# Patient Record
Sex: Female | Born: 1960
Health system: Southern US, Community
[De-identification: ages and names within clinical notes are randomized; demographics above are authoritative.]

## PROBLEM LIST (undated history)

## (undated) DIAGNOSIS — D649 Anemia, unspecified: Secondary | ICD-10-CM

## (undated) DIAGNOSIS — K2210 Ulcer of esophagus without bleeding: Secondary | ICD-10-CM

## (undated) DIAGNOSIS — I10 Essential (primary) hypertension: Secondary | ICD-10-CM

## (undated) DIAGNOSIS — I219 Acute myocardial infarction, unspecified: Secondary | ICD-10-CM

## (undated) DIAGNOSIS — M797 Fibromyalgia: Secondary | ICD-10-CM

## (undated) DIAGNOSIS — J310 Chronic rhinitis: Secondary | ICD-10-CM

## (undated) DIAGNOSIS — F32A Depression, unspecified: Secondary | ICD-10-CM

## (undated) DIAGNOSIS — K449 Diaphragmatic hernia without obstruction or gangrene: Secondary | ICD-10-CM

## (undated) DIAGNOSIS — R51 Headache: Secondary | ICD-10-CM

## (undated) DIAGNOSIS — E785 Hyperlipidemia, unspecified: Secondary | ICD-10-CM

## (undated) DIAGNOSIS — M545 Low back pain, unspecified: Secondary | ICD-10-CM

## (undated) DIAGNOSIS — G8929 Other chronic pain: Secondary | ICD-10-CM

## (undated) DIAGNOSIS — J302 Other seasonal allergic rhinitis: Secondary | ICD-10-CM

## (undated) DIAGNOSIS — R519 Headache, unspecified: Secondary | ICD-10-CM

## (undated) DIAGNOSIS — K253 Acute gastric ulcer without hemorrhage or perforation: Secondary | ICD-10-CM

## (undated) DIAGNOSIS — F419 Anxiety disorder, unspecified: Secondary | ICD-10-CM

## (undated) DIAGNOSIS — K219 Gastro-esophageal reflux disease without esophagitis: Secondary | ICD-10-CM

## (undated) DIAGNOSIS — F329 Major depressive disorder, single episode, unspecified: Secondary | ICD-10-CM

## (undated) DIAGNOSIS — I2510 Atherosclerotic heart disease of native coronary artery without angina pectoris: Secondary | ICD-10-CM

## (undated) DIAGNOSIS — T7840XA Allergy, unspecified, initial encounter: Secondary | ICD-10-CM

## (undated) DIAGNOSIS — G894 Chronic pain syndrome: Secondary | ICD-10-CM

## (undated) DIAGNOSIS — R58 Hemorrhage, not elsewhere classified: Secondary | ICD-10-CM

## (undated) HISTORY — DX: Allergy, unspecified, initial encounter: T78.40XA

## (undated) HISTORY — DX: Chronic pain syndrome: G89.4

## (undated) HISTORY — DX: Anxiety disorder, unspecified: F41.9

## (undated) HISTORY — DX: Atherosclerotic heart disease of native coronary artery without angina pectoris: I25.10

## (undated) HISTORY — DX: Hyperlipidemia, unspecified: E78.5

## (undated) HISTORY — DX: Diaphragmatic hernia without obstruction or gangrene: K44.9

## (undated) HISTORY — DX: Anemia, unspecified: D64.9

## (undated) HISTORY — DX: Essential (primary) hypertension: I10

## (undated) HISTORY — DX: Gastro-esophageal reflux disease without esophagitis: K21.9

## (undated) HISTORY — DX: Depression, unspecified: F32.A

## (undated) HISTORY — DX: Hemorrhage, not elsewhere classified: R58

## (undated) HISTORY — DX: Acute gastric ulcer without hemorrhage or perforation: K25.3

## (undated) HISTORY — PX: COLONOSCOPY: SHX174

## (undated) HISTORY — DX: Chronic rhinitis: J31.0

## (undated) HISTORY — DX: Acute myocardial infarction, unspecified: I21.9

## (undated) HISTORY — DX: Fibromyalgia: M79.7

## (undated) HISTORY — DX: Major depressive disorder, single episode, unspecified: F32.9

## (undated) HISTORY — DX: Ulcer of esophagus without bleeding: K22.10

## (undated) HISTORY — PX: UPPER GASTROINTESTINAL ENDOSCOPY: SHX188

---

## 2000-03-08 ENCOUNTER — Encounter: Admission: RE | Admit: 2000-03-08 | Discharge: 2000-03-08 | Payer: Self-pay | Admitting: Internal Medicine

## 2000-03-24 ENCOUNTER — Ambulatory Visit (HOSPITAL_BASED_OUTPATIENT_CLINIC_OR_DEPARTMENT_OTHER): Admission: RE | Admit: 2000-03-24 | Discharge: 2000-03-24 | Payer: Self-pay | Admitting: General Surgery

## 2000-03-24 ENCOUNTER — Encounter (INDEPENDENT_AMBULATORY_CARE_PROVIDER_SITE_OTHER): Payer: Self-pay | Admitting: Specialist

## 2000-03-27 ENCOUNTER — Emergency Department (HOSPITAL_COMMUNITY): Admission: EM | Admit: 2000-03-27 | Discharge: 2000-03-27 | Payer: Self-pay | Admitting: *Deleted

## 2000-03-29 HISTORY — PX: BREAST CYST EXCISION: SHX579

## 2002-02-14 ENCOUNTER — Emergency Department (HOSPITAL_COMMUNITY): Admission: EM | Admit: 2002-02-14 | Discharge: 2002-02-14 | Payer: Self-pay | Admitting: Emergency Medicine

## 2003-05-29 ENCOUNTER — Encounter (INDEPENDENT_AMBULATORY_CARE_PROVIDER_SITE_OTHER): Payer: Self-pay | Admitting: Internal Medicine

## 2003-05-29 ENCOUNTER — Encounter: Admission: RE | Admit: 2003-05-29 | Discharge: 2003-05-29 | Payer: Self-pay | Admitting: Family Medicine

## 2003-06-28 ENCOUNTER — Emergency Department (HOSPITAL_COMMUNITY): Admission: EM | Admit: 2003-06-28 | Discharge: 2003-06-28 | Payer: Self-pay | Admitting: Emergency Medicine

## 2004-03-29 HISTORY — PX: CORONARY ANGIOPLASTY: SHX604

## 2004-12-17 ENCOUNTER — Inpatient Hospital Stay (HOSPITAL_COMMUNITY): Admission: EM | Admit: 2004-12-17 | Discharge: 2004-12-24 | Payer: Self-pay | Admitting: Emergency Medicine

## 2004-12-22 ENCOUNTER — Encounter (INDEPENDENT_AMBULATORY_CARE_PROVIDER_SITE_OTHER): Payer: Self-pay | Admitting: *Deleted

## 2005-01-07 ENCOUNTER — Inpatient Hospital Stay (HOSPITAL_COMMUNITY): Admission: EM | Admit: 2005-01-07 | Discharge: 2005-01-08 | Payer: Self-pay | Admitting: *Deleted

## 2005-01-15 ENCOUNTER — Ambulatory Visit: Payer: Self-pay | Admitting: Internal Medicine

## 2005-02-01 ENCOUNTER — Ambulatory Visit: Payer: Self-pay | Admitting: Internal Medicine

## 2005-03-30 ENCOUNTER — Ambulatory Visit: Payer: Self-pay | Admitting: Internal Medicine

## 2005-04-26 ENCOUNTER — Ambulatory Visit: Payer: Self-pay | Admitting: Internal Medicine

## 2005-06-30 ENCOUNTER — Ambulatory Visit: Payer: Self-pay | Admitting: Internal Medicine

## 2005-08-03 ENCOUNTER — Ambulatory Visit: Payer: Self-pay | Admitting: Internal Medicine

## 2005-08-19 ENCOUNTER — Ambulatory Visit: Payer: Self-pay | Admitting: Internal Medicine

## 2005-09-17 ENCOUNTER — Ambulatory Visit: Payer: Self-pay | Admitting: Internal Medicine

## 2005-11-15 ENCOUNTER — Ambulatory Visit: Payer: Self-pay | Admitting: Internal Medicine

## 2005-12-20 ENCOUNTER — Ambulatory Visit: Payer: Self-pay | Admitting: Internal Medicine

## 2006-02-07 ENCOUNTER — Emergency Department (HOSPITAL_COMMUNITY): Admission: EM | Admit: 2006-02-07 | Discharge: 2006-02-08 | Payer: Self-pay | Admitting: Emergency Medicine

## 2006-02-09 ENCOUNTER — Ambulatory Visit: Payer: Self-pay | Admitting: Internal Medicine

## 2006-05-03 ENCOUNTER — Ambulatory Visit: Payer: Self-pay | Admitting: Internal Medicine

## 2006-06-09 ENCOUNTER — Ambulatory Visit: Payer: Self-pay | Admitting: Internal Medicine

## 2006-09-09 ENCOUNTER — Ambulatory Visit: Payer: Self-pay | Admitting: Internal Medicine

## 2006-09-09 LAB — CONVERTED CEMR LAB
ALT: 16 units/L (ref 0–40)
AST: 25 units/L (ref 0–37)
Bilirubin, Direct: 0.1 mg/dL (ref 0.0–0.3)
CO2: 26 meq/L (ref 19–32)
Calcium: 10 mg/dL (ref 8.4–10.5)
Chloride: 108 meq/L (ref 96–112)
Cholesterol: 181 mg/dL (ref 0–200)
Eosinophils Absolute: 0.1 10*3/uL (ref 0.0–0.6)
Eosinophils Relative: 1 % (ref 0.0–5.0)
GFR calc Af Amer: 87 mL/min
Glucose, Bld: 100 mg/dL — ABNORMAL HIGH (ref 70–99)
HCT: 40 % (ref 36.0–46.0)
Hemoglobin: 13.6 g/dL (ref 12.0–15.0)
Ketones, ur: NEGATIVE mg/dL
Leukocytes, UA: NEGATIVE
Lymphocytes Relative: 18.8 % (ref 12.0–46.0)
MCV: 94.8 fL (ref 78.0–100.0)
Monocytes Absolute: 0.5 10*3/uL (ref 0.2–0.7)
Neutro Abs: 7.5 10*3/uL (ref 1.4–7.7)
Neutrophils Relative %: 74.4 % (ref 43.0–77.0)
Nitrite: NEGATIVE
RBC: 4.22 M/uL (ref 3.87–5.11)
Total Protein: 7.7 g/dL (ref 6.0–8.3)
WBC: 10 10*3/uL (ref 4.5–10.5)

## 2007-06-27 ENCOUNTER — Ambulatory Visit: Payer: Self-pay | Admitting: Internal Medicine

## 2007-06-27 DIAGNOSIS — I2510 Atherosclerotic heart disease of native coronary artery without angina pectoris: Secondary | ICD-10-CM

## 2007-06-27 DIAGNOSIS — K219 Gastro-esophageal reflux disease without esophagitis: Secondary | ICD-10-CM | POA: Insufficient documentation

## 2007-06-27 DIAGNOSIS — E785 Hyperlipidemia, unspecified: Secondary | ICD-10-CM

## 2007-06-27 DIAGNOSIS — F411 Generalized anxiety disorder: Secondary | ICD-10-CM | POA: Insufficient documentation

## 2007-06-27 DIAGNOSIS — I10 Essential (primary) hypertension: Secondary | ICD-10-CM | POA: Insufficient documentation

## 2007-07-27 ENCOUNTER — Ambulatory Visit: Payer: Self-pay | Admitting: Internal Medicine

## 2007-07-27 DIAGNOSIS — F329 Major depressive disorder, single episode, unspecified: Secondary | ICD-10-CM

## 2007-07-27 DIAGNOSIS — F32A Depression, unspecified: Secondary | ICD-10-CM | POA: Insufficient documentation

## 2007-08-24 ENCOUNTER — Encounter: Payer: Self-pay | Admitting: Internal Medicine

## 2007-09-25 ENCOUNTER — Inpatient Hospital Stay (HOSPITAL_COMMUNITY): Admission: EM | Admit: 2007-09-25 | Discharge: 2007-09-28 | Payer: Self-pay | Admitting: Emergency Medicine

## 2007-09-26 ENCOUNTER — Ambulatory Visit: Payer: Self-pay | Admitting: Internal Medicine

## 2007-09-29 ENCOUNTER — Ambulatory Visit: Payer: Self-pay | Admitting: Family Medicine

## 2007-10-04 ENCOUNTER — Telehealth: Payer: Self-pay | Admitting: Internal Medicine

## 2007-10-17 ENCOUNTER — Ambulatory Visit: Payer: Self-pay | Admitting: Internal Medicine

## 2007-12-20 ENCOUNTER — Ambulatory Visit: Payer: Self-pay | Admitting: Internal Medicine

## 2007-12-22 LAB — CONVERTED CEMR LAB
BUN: 13 mg/dL (ref 6–23)
Bilirubin Urine: NEGATIVE
Calcium: 9 mg/dL (ref 8.4–10.5)
Crystals: NEGATIVE
GFR calc Af Amer: 77 mL/min
Glucose, Bld: 92 mg/dL (ref 70–99)
Hemoglobin, Urine: NEGATIVE
Ketones, ur: NEGATIVE mg/dL
Potassium: 4 meq/L (ref 3.5–5.1)
Urine Glucose: NEGATIVE mg/dL
Urobilinogen, UA: 0.2 (ref 0.0–1.0)

## 2008-01-03 ENCOUNTER — Inpatient Hospital Stay (HOSPITAL_COMMUNITY): Admission: EM | Admit: 2008-01-03 | Discharge: 2008-01-07 | Payer: Self-pay | Admitting: Emergency Medicine

## 2008-01-03 ENCOUNTER — Ambulatory Visit: Payer: Self-pay | Admitting: Internal Medicine

## 2008-01-04 ENCOUNTER — Ambulatory Visit: Payer: Self-pay | Admitting: Gastroenterology

## 2008-01-12 ENCOUNTER — Ambulatory Visit: Payer: Self-pay | Admitting: Internal Medicine

## 2008-01-12 DIAGNOSIS — K922 Gastrointestinal hemorrhage, unspecified: Secondary | ICD-10-CM | POA: Insufficient documentation

## 2008-01-12 DIAGNOSIS — M545 Low back pain: Secondary | ICD-10-CM

## 2008-01-13 ENCOUNTER — Telehealth: Payer: Self-pay | Admitting: Internal Medicine

## 2008-02-13 ENCOUNTER — Ambulatory Visit: Payer: Self-pay | Admitting: Internal Medicine

## 2008-02-13 LAB — CONVERTED CEMR LAB
Basophils Absolute: 0.1 10*3/uL (ref 0.0–0.1)
CO2: 29 meq/L (ref 19–32)
Calcium: 9.5 mg/dL (ref 8.4–10.5)
Eosinophils Absolute: 0.2 10*3/uL (ref 0.0–0.7)
GFR calc Af Amer: 69 mL/min
GFR calc non Af Amer: 57 mL/min
Hemoglobin: 11.3 g/dL — ABNORMAL LOW (ref 12.0–15.0)
Lymphocytes Relative: 18.2 % (ref 12.0–46.0)
MCHC: 34.4 g/dL (ref 30.0–36.0)
MCV: 94.4 fL (ref 78.0–100.0)
Neutro Abs: 6.5 10*3/uL (ref 1.4–7.7)
RDW: 13.3 % (ref 11.5–14.6)
Sodium: 138 meq/L (ref 135–145)

## 2008-03-19 ENCOUNTER — Ambulatory Visit: Payer: Self-pay | Admitting: Internal Medicine

## 2008-04-30 ENCOUNTER — Ambulatory Visit: Payer: Self-pay | Admitting: Internal Medicine

## 2008-05-31 ENCOUNTER — Ambulatory Visit: Payer: Self-pay | Admitting: Internal Medicine

## 2008-06-21 ENCOUNTER — Ambulatory Visit: Payer: Self-pay | Admitting: Internal Medicine

## 2008-06-21 LAB — CONVERTED CEMR LAB
AST: 21 units/L (ref 0–37)
Alkaline Phosphatase: 38 units/L — ABNORMAL LOW (ref 39–117)
Bilirubin, Direct: 0 mg/dL (ref 0.0–0.3)
Direct LDL: 125 mg/dL
GFR calc non Af Amer: 86.62 mL/min (ref >60)
Glucose, Bld: 95 mg/dL (ref 70–99)
HDL: 56.7 mg/dL (ref >39.00)
Potassium: 4.1 meq/L (ref 3.5–5.1)
Sodium: 137 meq/L (ref 135–145)
Total Bilirubin: 0.8 mg/dL (ref 0.3–1.2)
Total CHOL/HDL Ratio: 4
VLDL: 19.2 mg/dL (ref 0.0–40.0)
Vit D, 25-Hydroxy: 30 ng/mL (ref 30–89)

## 2008-07-03 ENCOUNTER — Ambulatory Visit: Payer: Self-pay | Admitting: Internal Medicine

## 2008-07-04 LAB — CONVERTED CEMR LAB
Bilirubin Urine: NEGATIVE
Ketones, ur: NEGATIVE mg/dL
Leukocytes, UA: NEGATIVE
Nitrite: NEGATIVE

## 2008-09-03 ENCOUNTER — Emergency Department (HOSPITAL_COMMUNITY): Admission: EM | Admit: 2008-09-03 | Discharge: 2008-09-04 | Payer: Self-pay | Admitting: Emergency Medicine

## 2008-09-06 ENCOUNTER — Encounter (INDEPENDENT_AMBULATORY_CARE_PROVIDER_SITE_OTHER): Payer: Self-pay | Admitting: Internal Medicine

## 2008-09-06 ENCOUNTER — Ambulatory Visit: Payer: Self-pay | Admitting: Internal Medicine

## 2008-09-13 ENCOUNTER — Ambulatory Visit: Payer: Self-pay | Admitting: Internal Medicine

## 2008-10-07 ENCOUNTER — Ambulatory Visit: Payer: Self-pay | Admitting: Infectious Diseases

## 2008-10-07 ENCOUNTER — Encounter (INDEPENDENT_AMBULATORY_CARE_PROVIDER_SITE_OTHER): Payer: Self-pay | Admitting: Internal Medicine

## 2008-10-07 DIAGNOSIS — R51 Headache: Secondary | ICD-10-CM

## 2008-10-07 DIAGNOSIS — R519 Headache, unspecified: Secondary | ICD-10-CM | POA: Insufficient documentation

## 2008-10-07 LAB — CONVERTED CEMR LAB
ALT: 11 units/L (ref 0–35)
Anti Nuclear Antibody(ANA): NEGATIVE
CO2: 28 meq/L (ref 19–32)
Calcium: 9.8 mg/dL (ref 8.4–10.5)
Chloride: 103 meq/L (ref 96–112)
Creatinine, Ser: 0.95 mg/dL (ref 0.40–1.20)
Glucose, Bld: 96 mg/dL (ref 70–99)
MCV: 94.8 fL (ref 78.0–100.0)
Platelets: 435 10*3/uL — ABNORMAL HIGH (ref 150–400)
RBC: 3.88 M/uL (ref 3.87–5.11)
Sed Rate: 8 mm/hr (ref 0–22)
Total Bilirubin: 0.3 mg/dL (ref 0.3–1.2)
Total Protein: 7.2 g/dL (ref 6.0–8.3)
WBC: 5.8 10*3/uL (ref 4.0–10.5)

## 2008-10-16 ENCOUNTER — Ambulatory Visit: Payer: Self-pay | Admitting: Internal Medicine

## 2008-10-16 ENCOUNTER — Ambulatory Visit (HOSPITAL_COMMUNITY): Admission: RE | Admit: 2008-10-16 | Discharge: 2008-10-16 | Payer: Self-pay | Admitting: Infectious Diseases

## 2009-02-26 ENCOUNTER — Telehealth: Payer: Self-pay | Admitting: Internal Medicine

## 2009-03-06 ENCOUNTER — Ambulatory Visit (HOSPITAL_COMMUNITY): Admission: RE | Admit: 2009-03-06 | Discharge: 2009-03-06 | Payer: Self-pay | Admitting: Internal Medicine

## 2009-03-06 ENCOUNTER — Ambulatory Visit: Payer: Self-pay | Admitting: Infectious Diseases

## 2009-03-06 ENCOUNTER — Encounter (INDEPENDENT_AMBULATORY_CARE_PROVIDER_SITE_OTHER): Payer: Self-pay | Admitting: Internal Medicine

## 2009-03-06 LAB — CONVERTED CEMR LAB
ALT: 12 units/L (ref 0–35)
AST: 15 units/L (ref 0–37)
Albumin: 4.4 g/dL (ref 3.5–5.2)
Alkaline Phosphatase: 38 units/L — ABNORMAL LOW (ref 39–117)
BUN: 20 mg/dL (ref 6–23)
Calcium: 9.9 mg/dL (ref 8.4–10.5)
Chloride: 103 meq/L (ref 96–112)
HDL: 61 mg/dL (ref >39)
LDL Cholesterol: 118 mg/dL — ABNORMAL HIGH (ref 0–99)
Lymphocytes Relative: 31 % (ref 12–46)
Lymphs Abs: 2.4 10*3/uL (ref 0.7–4.0)
MCV: 94.8 fL (ref >=78.0)
Monocytes Relative: 9 % (ref 3–12)
Neutro Abs: 4.4 10*3/uL (ref 1.7–7.7)
Neutrophils Relative %: 58 % (ref 43–77)
Platelets: 448 10*3/uL — ABNORMAL HIGH (ref 150–400)
Potassium: 4.4 meq/L (ref 3.5–5.3)
RBC: 4 M/uL (ref 3.87–5.11)
Sed Rate: 10 mm/hr (ref 0–22)
Sodium: 139 meq/L (ref 135–145)
TSH: 2.974 microintl units/mL (ref 0.350–4.5)
Total CK: 64 units/L (ref 7–177)
Total Protein: 7.6 g/dL (ref 6.0–8.3)
WBC: 7.5 10*3/uL (ref 4.0–10.5)

## 2009-03-31 ENCOUNTER — Telehealth: Payer: Self-pay | Admitting: Licensed Clinical Social Worker

## 2009-03-31 ENCOUNTER — Telehealth: Payer: Self-pay | Admitting: *Deleted

## 2009-04-03 ENCOUNTER — Ambulatory Visit: Payer: Self-pay | Admitting: Internal Medicine

## 2009-04-03 DIAGNOSIS — G894 Chronic pain syndrome: Secondary | ICD-10-CM | POA: Insufficient documentation

## 2009-04-15 DIAGNOSIS — I214 Non-ST elevation (NSTEMI) myocardial infarction: Secondary | ICD-10-CM | POA: Insufficient documentation

## 2009-04-18 ENCOUNTER — Ambulatory Visit: Payer: Self-pay | Admitting: Cardiology

## 2009-04-18 DIAGNOSIS — M797 Fibromyalgia: Secondary | ICD-10-CM

## 2009-04-22 ENCOUNTER — Telehealth (INDEPENDENT_AMBULATORY_CARE_PROVIDER_SITE_OTHER): Payer: Self-pay | Admitting: *Deleted

## 2009-04-23 ENCOUNTER — Ambulatory Visit: Payer: Self-pay | Admitting: Cardiology

## 2009-04-23 ENCOUNTER — Ambulatory Visit: Payer: Self-pay

## 2009-04-23 ENCOUNTER — Encounter (HOSPITAL_COMMUNITY): Admission: RE | Admit: 2009-04-23 | Discharge: 2009-07-02 | Payer: Self-pay | Admitting: Cardiology

## 2009-04-29 ENCOUNTER — Telehealth (INDEPENDENT_AMBULATORY_CARE_PROVIDER_SITE_OTHER): Payer: Self-pay | Admitting: *Deleted

## 2009-05-16 ENCOUNTER — Ambulatory Visit: Payer: Self-pay | Admitting: Internal Medicine

## 2009-05-16 LAB — CONVERTED CEMR LAB
ALT: <8 units/L (ref 0–35)
AST: 13 units/L (ref 0–37)
Albumin: 4.2 g/dL (ref 3.5–5.2)
Alkaline Phosphatase: 45 units/L (ref 39–117)
BUN: 9 mg/dL (ref 6–23)
HDL: 75 mg/dL (ref >39)
LDL Cholesterol: 65 mg/dL (ref 0–99)
Potassium: 4.3 meq/L (ref 3.5–5.3)
Total CHOL/HDL Ratio: 2.1

## 2009-05-27 ENCOUNTER — Telehealth: Payer: Self-pay | Admitting: Licensed Clinical Social Worker

## 2009-06-18 ENCOUNTER — Telehealth (INDEPENDENT_AMBULATORY_CARE_PROVIDER_SITE_OTHER): Payer: Self-pay | Admitting: Internal Medicine

## 2009-06-24 ENCOUNTER — Ambulatory Visit: Payer: Self-pay | Admitting: Cardiology

## 2009-07-21 ENCOUNTER — Encounter (INDEPENDENT_AMBULATORY_CARE_PROVIDER_SITE_OTHER): Payer: Self-pay | Admitting: Internal Medicine

## 2009-07-21 ENCOUNTER — Telehealth (INDEPENDENT_AMBULATORY_CARE_PROVIDER_SITE_OTHER): Payer: Self-pay | Admitting: Internal Medicine

## 2009-07-30 ENCOUNTER — Telehealth (INDEPENDENT_AMBULATORY_CARE_PROVIDER_SITE_OTHER): Payer: Self-pay | Admitting: Internal Medicine

## 2009-08-20 ENCOUNTER — Encounter (INDEPENDENT_AMBULATORY_CARE_PROVIDER_SITE_OTHER): Payer: Self-pay | Admitting: Internal Medicine

## 2009-08-28 ENCOUNTER — Encounter: Admission: RE | Admit: 2009-08-28 | Discharge: 2009-08-28 | Payer: Self-pay | Admitting: Internal Medicine

## 2009-08-28 LAB — HM MAMMOGRAPHY

## 2009-10-02 ENCOUNTER — Encounter: Payer: Self-pay | Admitting: Licensed Clinical Social Worker

## 2009-10-02 ENCOUNTER — Ambulatory Visit: Payer: Self-pay | Admitting: Internal Medicine

## 2009-10-09 ENCOUNTER — Telehealth: Payer: Self-pay | Admitting: Licensed Clinical Social Worker

## 2009-11-13 ENCOUNTER — Ambulatory Visit: Payer: Self-pay | Admitting: Cardiology

## 2009-11-13 ENCOUNTER — Ambulatory Visit: Payer: Self-pay | Admitting: Internal Medicine

## 2009-11-13 ENCOUNTER — Encounter: Payer: Self-pay | Admitting: Internal Medicine

## 2009-11-13 ENCOUNTER — Inpatient Hospital Stay (HOSPITAL_COMMUNITY): Admission: AD | Admit: 2009-11-13 | Discharge: 2009-11-15 | Payer: Self-pay | Admitting: Internal Medicine

## 2009-11-13 DIAGNOSIS — R635 Abnormal weight gain: Secondary | ICD-10-CM

## 2009-11-14 ENCOUNTER — Encounter (INDEPENDENT_AMBULATORY_CARE_PROVIDER_SITE_OTHER): Payer: Self-pay | Admitting: Internal Medicine

## 2009-11-21 ENCOUNTER — Ambulatory Visit: Payer: Self-pay | Admitting: Internal Medicine

## 2009-11-21 ENCOUNTER — Encounter: Payer: Self-pay | Admitting: Internal Medicine

## 2009-11-21 DIAGNOSIS — J90 Pleural effusion, not elsewhere classified: Secondary | ICD-10-CM | POA: Insufficient documentation

## 2009-11-21 LAB — CONVERTED CEMR LAB
Hgb A1c MFr Bld: 4.8 %
Sed Rate: 25 mm/hr — ABNORMAL HIGH (ref 0–22)

## 2009-12-05 ENCOUNTER — Ambulatory Visit: Payer: Self-pay | Admitting: Licensed Clinical Social Worker

## 2009-12-21 ENCOUNTER — Emergency Department (HOSPITAL_COMMUNITY)
Admission: EM | Admit: 2009-12-21 | Discharge: 2009-12-21 | Payer: Self-pay | Source: Home / Self Care | Admitting: Emergency Medicine

## 2009-12-21 LAB — CONVERTED CEMR LAB
ALT: 38 units/L
Alkaline Phosphatase: 55 units/L
BUN: 11 mg/dL
Calcium: 9.6 mg/dL
Glucose, Bld: 119 mg/dL
MCV: 80.3 fL
Platelets: 455 10*3/uL
Total Bilirubin: 0.5 mg/dL
Total Protein: 7.3 g/dL

## 2010-01-20 ENCOUNTER — Ambulatory Visit: Payer: Self-pay | Admitting: Internal Medicine

## 2010-01-23 ENCOUNTER — Ambulatory Visit: Payer: Self-pay | Admitting: Licensed Clinical Social Worker

## 2010-01-30 ENCOUNTER — Ambulatory Visit: Payer: Self-pay | Admitting: Licensed Clinical Social Worker

## 2010-02-20 ENCOUNTER — Encounter (INDEPENDENT_AMBULATORY_CARE_PROVIDER_SITE_OTHER): Payer: Self-pay | Admitting: *Deleted

## 2010-03-10 ENCOUNTER — Ambulatory Visit: Payer: Self-pay | Admitting: Internal Medicine

## 2010-03-10 DIAGNOSIS — J310 Chronic rhinitis: Secondary | ICD-10-CM

## 2010-03-12 ENCOUNTER — Ambulatory Visit: Payer: Self-pay | Admitting: Internal Medicine

## 2010-03-25 ENCOUNTER — Telehealth (INDEPENDENT_AMBULATORY_CARE_PROVIDER_SITE_OTHER): Payer: Self-pay | Admitting: *Deleted

## 2010-04-02 ENCOUNTER — Telehealth: Payer: Self-pay | Admitting: Internal Medicine

## 2010-04-13 ENCOUNTER — Other Ambulatory Visit: Payer: Self-pay | Admitting: Internal Medicine

## 2010-04-13 ENCOUNTER — Ambulatory Visit
Admission: RE | Admit: 2010-04-13 | Discharge: 2010-04-13 | Payer: Self-pay | Source: Home / Self Care | Attending: Internal Medicine | Admitting: Internal Medicine

## 2010-04-13 ENCOUNTER — Encounter: Payer: Self-pay | Admitting: Internal Medicine

## 2010-04-14 LAB — SEDIMENTATION RATE: Sed Rate: 20 mm/hr (ref 0–22)

## 2010-04-21 LAB — CONVERTED CEMR LAB: IgE (Immunoglobulin E), Serum: 3.7 intl units/mL (ref 0.0–180.0)

## 2010-04-23 ENCOUNTER — Ambulatory Visit: Admit: 2010-04-23 | Payer: Self-pay | Admitting: Internal Medicine
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
## 2010-04-28 NOTE — Assessment & Plan Note (Signed)
Summary: new / medicare / # / cd   Vital Signs:  Patient profile:   50 year old female Height:      63 inches (160.02 cm) Weight:      178.4 pounds (81.09 kg) O2 Sat:      99 % on Room air Temp:     98.1 degrees F (36.72 degrees C) oral Pulse rate:   93 / minute BP sitting:   124 / 92  (left arm) Cuff size:   large  Vitals Entered By: Tomma Lightning RMA (January 20, 2010 9:56 AM)  O2 Flow:  Room air CC: New patient Is Patient Diabetic? No Pain Assessment Patient in pain? no      Comments Need to clarify if she need to be taking gemfibrozil. Pt was d/c from hosp on 11/15/09   Primary Care Provider:  Rowe Clack MD  CC:  New patient.  History of Present Illness: new pt to me but known to our practice, here to est with new PCP prev seen by AVP in my office (last 06/2008) then [facility]  1) CAD - hx MI with angio 2006 - recent hosp 10/2009 for CP symptoms - noncardiac etiology - reports compliance with ongoing medical treatment and no changes in medication dose or frequency. denies adverse side effects related to current therapy.   2) HTN - reports compliance with ongoing medical treatment and no changes in medication dose or frequency. denies adverse side effects related to current therapy. no HA or edema  3) dylipidemia - reports compliance with ongoing medical treatment and no changes in medication dose or frequency. denies adverse side effects related to current therapy.  no myalgias or GI issues - ?not taking gemfib b/c no refills  4) depression - complicated by insomnia - follows with psyc for same -   5) GERD - reports compliance with ongoing medical treatment and no changes in medication dose or frequency. denies adverse side effects related to current therapy.   Preventive Screening-Counseling & Management  Alcohol-Tobacco     Alcohol drinks/Camp: 0     Alcohol Counseling: not indicated; patient does not drink     Smoking Status: quit     Year Quit: 2004     Tobacco  Counseling: to remain off tobacco products  Caffeine-Diet-Exercise     Does Patient Exercise: no     Type of exercise: walking     Exercise Counseling: to improve exercise regimen  Safety-Violence-Falls     Seat Belt Counseling: not indicated; patient wears seat belts     Helmet Counseling: not applicable     Smoke Detector Counseling: n/a     Violence Counseling: not indicated; no violence risk noted  Clinical Review Panels:  Prevention   Last Mammogram:  BI-RADS CATEGORY 3:  Probably benign finding(s) - short interval^MM DIGITAL DIAGNOSTIC BILAT (08/28/2009)  Immunizations   Last Flu Vaccine:  Fluvax 3+ (01/12/2008)  Lipid Management   Cholesterol:  203 (11/14/2009)   LDL (bad choesterol):  65 (05/16/2009)   HDL (good cholesterol):  47 (11/14/2009)   Triglycerides:  444 (11/14/2009)  Diabetes Management   HgBA1C:  4.8 (11/21/2009)   Creatinine:  1.26 (12/21/2009)   Last Flu Vaccine:  Fluvax 3+ (01/12/2008)  CBC   WBC:  10.2 (12/21/2009)   RBC:  3.56 (12/21/2009)   Hgb:  9.1 (12/21/2009)   Hct:  28.6 (12/21/2009)   Platelets:  455 (12/21/2009)   MCV  80.3 (12/21/2009)   MCHC  33.0 (03/06/2009)  RDW  15.8 (12/21/2009)   PMN:  58 (03/06/2009)   Lymphs:  31 (03/06/2009)   Monos:  9 (03/06/2009)   Eosinophils:  1 (03/06/2009)   Basophil:  0 (03/06/2009)  Complete Metabolic Panel   Glucose:  119 (12/21/2009)   Sodium:  136 (12/21/2009)   Potassium:  3.5 (12/21/2009)   Chloride:  102 (12/21/2009)   CO2:  27 (12/21/2009)   BUN:  11 (12/21/2009)   Creatinine:  1.26 (12/21/2009)   Albumin:  3.6 (12/21/2009)   Total Protein:  7.3 (12/21/2009)   Calcium:  9.6 (12/21/2009)   Total Bili:  0.5 (12/21/2009)   Alk Phos:  55 (12/21/2009)   SGPT (ALT):  38 (12/21/2009)   SGOT (AST):  37 (12/21/2009)   -  Date:  12/21/2009    WBC: 10.2    HGB: 9.1    HCT: 28.6    RBC: 3.56    PLT: 455    MCV: 80.3    RDW: 15.8    BG Random: 119    BUN: 11    Creatinine:  1.26    Sodium: 136    Potassium: 3.5    Chloride: 102    CO2 Total: 27    SGOT (AST): 37    SGPT (ALT): 38    T. Bilirubin: 0.5    Alk Phos: 55    Calcium: 9.6    Total Protein: 7.3    Albumin: 3.6    TSH: 2.475  Date:  11/14/2009    Cholesterol: 203    HDL: 47    Triglycerides: 444  Date:  11/13/2009    TSH: 2.560  Current Medications (verified): 1)  Hydrochlorothiazide 25 Mg Tabs (Hydrochlorothiazide) .... Take One Tab Daily 2)  Nitrostat 0.4 Mg Subl (Nitroglycerin) .... Prn 3)  Claritin-D 12 Hour 5-120 Mg Xr12h-Tab (Loratadine-Pseudoephedrine) .... Take One Tablet Daily For Sinuses. 4)  Pravastatin Sodium 40 Mg Tabs (Pravastatin Sodium) .... Take 1 Tab By Mouth At Bedtime 5)  Amitriptyline Hcl 100 Mg Tabs (Amitriptyline Hcl) .... Take 1 Tablet By Mouth Once A Haisley At Bedtime 6)  Clonazepam 1 Mg Tabs (Clonazepam) .... Take One Tablet Two Times A Fitzwater As Needed For Anxiety. 7)  Labetalol Hcl 200 Mg Tabs (Labetalol Hcl) .... Take 2 Tablets By Mouth Twice Daily 8)  Aspir-Low 81 Mg Tbec (Aspirin) .... Take One Tablet By Mouth Daily 9)  Omeprazole 20 Mg Cpdr (Omeprazole) .... Take 1 Tab Two Times A Ogando 15 Mins Before Meals. 10)  Metoclopramide Hcl 5 Mg Tabs (Metoclopramide Hcl) .... Before Meals  and At Bed Time As Needed 11)  Gemfibrozil 600 Mg Tabs (Gemfibrozil) .... Take One Tablet Two Times A Eley For Cholesterol 12)  Vitamin D3 1000 Unit Tabs (Cholecalciferol) .... Take 1 By Mouth Once Daily 13)  Ranitidine Hcl 150 Mg Caps (Ranitidine Hcl) .... Take 1 Two Times A Logue 14)  Celexa 40 Mg Tabs (Citalopram Hydrobromide) .... Take 1 By Mouth Once Daily 15)  Mega Red 300mg  (Ovc) .... Take 1 By Mouth Once Daily  Allergies (verified): 1)  Clonidine Hcl 2)  Ibuprofen 3)  Lovastatin (Lovastatin)  Past History:  Past Medical History: HYPERTENSION  HYPERLIPIDEMIA CORONARY ARTERY DISEASE s/p PCI 11/2004 due to ant MI CHRONIC PAIN SYNDROME UPPER GASTROINTESTINAL HEMORRHAGE- GI  bleed 12/2007 due to NSAID DEPRESSION  GERD ANXIETY Fibromyalgia  Md roster: cards - [facility] psyc - [facility] - susan bond  Past Surgical History: s/p L breast cyst removal 2002  Social History:  Occupation: unempl. on disability lives alone, divorced prev lived with female domestic partner  1 child  Former Smoker No drugs Occasional ETOH  Review of Systems       c/o weight gain; otherwise, see HPI above. I have reviewed all other systems and they were negative.   Physical Exam  General:  alert and well-developed.  well-nourished and overweight-appearing.  mom at side Head:  Normocephalic and atraumatic without obvious abnormalities. No apparent alopecia or balding. Eyes:  vision grossly intact.   Ears:  no external deformities.   Mouth:  pharynx pink and moist.   Neck:  supple, full ROM, and no masses.   Lungs:  normal respiratory effort, no intercostal retractions or use of accessory muscles; normal breath sounds bilaterally - no crackles and no wheezes.    Heart:  normal rate, regular rhythm, no murmur, and no rub. BLE without edema. normal DP pulses and normal cap refill in all 4 extremities    Abdomen:  soft, non-tender, and normal bowel sounds.   Genitalia:  defer gyn Msk:  normal ROM, no joint tenderness, no joint swelling, no joint warmth, no redness over joints Neurologic:  alert & oriented X3 and cranial nerves II-XII symetrically intact.  strength normal in all extremities, sensation intact to light touch, and gait normal. speech fluent without dysarthria or aphasia; follows commands with good comprehension.    Skin:  Intact without suspicious lesions or rashes Psych:  Oriented X3, memory intact for recent and remote, withdrawn, poor eye contact, and poor concentration.     Impression & Recommendations:  Problem # 1:  CORONARY ARTERY DISEASE (ICD-414.00)  Her updated medication list for this problem includes:    Hydrochlorothiazide 25 Mg Tabs  (Hydrochlorothiazide) .[name]... Take one tab daily    Nitrostat 0.4 Mg Subl (Nitroglycerin) .[name]... Prn    Labetalol Hcl 200 Mg Tabs (Labetalol hcl) .[name]... Take 2 tablets by mouth twice daily    Aspir-low 81 Mg Tbec (Aspirin) .[name]... Take one tablet by mouth daily  cath 11/14/2009 - insig CAD -  report reviewed resume Gemfibrozil to her regimen (TG of >400 per E-chart review).  Low fat and high fiber diet advised. Handouts on nutrition and 1200 cal diet given. refer to nutritin for same Encouraged to exrcise daily as tolerated. follows with cards (crenshaw) q 6 mos - cont same  Problem # 2:  HYPERLIPIDEMIA (B2193296.4)  The following medications were removed from the medication list:    Gemfibrozil 600 Mg Tabs (Gemfibrozil) .[name]... Take one tablet two times a Flitton for cholesterol Her updated medication list for this problem includes:    Pravastatin Sodium 40 Mg Tabs (Pravastatin sodium) .[name]... Take 1 tab by mouth at bedtime    Gemfibrozil 600 Mg Tabs (Gemfibrozil) .[name]... 1 by mouth two times a Stillinger for cholesterol  Labs Reviewed: SGOT: 13 (05/16/2009)   SGPT: <8 U/L (05/16/2009)  Prior 10 Yr Risk Heart Disease: N/A (09/06/2008)   HDL:75 (05/16/2009), 61 (03/06/2009)  LDL:65 (05/16/2009), 118 (03/06/2009)  Chol:161 (05/16/2009), 194 (03/06/2009)  Trig:105 (05/16/2009), 75 (03/06/2009)  Orders: Nutrition Referral (Nutrition) Prescription Created Electronically [phone])  Problem # 3:  HYPERTENSION (ICD-401.9)  Her updated medication list for this problem includes:    Hydrochlorothiazide 25 Mg Tabs (Hydrochlorothiazide) .[name]... Take one tab daily    Labetalol Hcl 200 Mg Tabs (Labetalol hcl) .[name]... Take 2 tablets by mouth twice daily  BP today: 124/92 Prior BP: 142/102 (11/21/2009)  Prior 10 Yr Risk Heart Disease: N/A (09/06/2008)  Labs Reviewed:  K+: 4.3 (05/16/2009) Creat: : 0.93 (05/16/2009)   Chol: 161 (05/16/2009)   HDL: 75 (05/16/2009)   LDL: 65 (05/16/2009)   TG: 105 (05/16/2009)  Problem #  4:  DEPRESSION (123456) complicated with chronic insomnia now follows with psyc - ploysky + counseling for same meds reviewed - no changes by me today The following medications were removed from the medication list:    Bupropion Hcl 150 Mg Xr12h-tab (Bupropion hcl) .[name]... Take 1 tablet by mouth two times a Pickert Her updated medication list for this problem includes:    Amitriptyline Hcl 100 Mg Tabs (Amitriptyline hcl) .[name]... Take 1 tablet by mouth once a Maggi at bedtime    Clonazepam 1 Mg Tabs (Clonazepam) .[name]... Take one tablet two times a Vanyo as needed for anxiety.    Celexa 40 Mg Tabs (Citalopram hydrobromide) .[name]... Take 1 by mouth once daily  Time spent with patient/mom 45 minutes, more than 50% of this time was spent counseling patient on recent hosp and tests 10/2009, deprssion and medications, and need for weight control - advised portion control and nutrition f/u  Problem # 5:  WEIGHT GAIN (ICD-783.1)  Orders: Nutrition Referral (Nutrition)  Complete Medication List: 1)  Hydrochlorothiazide 25 Mg Tabs (Hydrochlorothiazide) .... Take one tab daily 2)  Nitrostat 0.4 Mg Subl (Nitroglycerin) .... Prn 3)  Claritin-d 12 Hour 5-120 Mg Xr12h-tab (Loratadine-pseudoephedrine) .... Take one tablet daily for sinuses. 4)  Pravastatin Sodium 40 Mg Tabs (Pravastatin sodium) .... Take 1 tab by mouth at bedtime 5)  Amitriptyline Hcl 100 Mg Tabs (Amitriptyline hcl) .... Take 1 tablet by mouth once a Dimichele at bedtime 6)  Clonazepam 1 Mg Tabs (Clonazepam) .... Take one tablet two times a Pilkington as needed for anxiety. 7)  Labetalol Hcl 200 Mg Tabs (Labetalol hcl) .... Take 2 tablets by mouth twice daily 8)  Aspir-low 81 Mg Tbec (Aspirin) .... Take one tablet by mouth daily 9)  Omeprazole 20 Mg Cpdr (Omeprazole) .... Take 1 tab two times a Fargnoli 15 mins before meals. 10)  Metoclopramide Hcl 5 Mg Tabs (Metoclopramide hcl) .... Before meals  and at bed time as needed 11)  Vitamin D3 1000 Unit Tabs (Cholecalciferol)  .... Take 1 by mouth once daily 12)  Ranitidine Hcl 150 Mg Caps (Ranitidine hcl) .... Take 1 two times a Pablo 13)  Celexa 40 Mg Tabs (Citalopram hydrobromide) .... Take 1 by mouth once daily 14)  Mega Red 300mg  (ovc)  .... Take 1 by mouth once daily 15)  Gemfibrozil 600 Mg Tabs (Gemfibrozil) .[name].. 1 by mouth two times a Mickel for cholesterol  Patient Instructions: 1)  it was good to see you today. 2)  hospitalization 10/2009 reviewed today -  3)  start gemfibrozil for cholesterol - take with pravastatin - your prescriptions have been electronically submitted to your pharmacy. Please take as directed. Contact our office if you believe you're having problems with the medication(s).  4)  we'll make referral to nutrition for advise on diet. Our office will contact you regarding this appointment once made.  5)  Rememebr portion control as discussed! 6)  continue to work with Dr. Casimiro Needle on medications for depression and sleep 7)  Please schedule a follow-up appointment in 3 months, sooner if problems.  Prescriptions: GEMFIBROZIL 600 MG TABS (GEMFIBROZIL) 1 by mouth two times a Beynon for cholesterol  #60 x 6   Entered and Authorized by:   Rowe Clack MD   Signed by:   Rowe Clack  MD on 01/20/2010   Method used:   Electronically to        [facility] (retail)       [address]       [place], [place]  [address]       Ph: [phone]       Fax: [phone]   RxID:   [id number]    Orders Added: 1)  Nutrition Referral [Nutrition] 2)  Est. Patient Level V [id number] 3)  Prescription Created Electronically [[id number]]     EKG  Procedure date:  11/13/2009  Findings:      Done @ hospital showed sinus rhythm with evidence of anterior infarct age indeterminate  CXR  Procedure date:  11/13/2009  Findings:      Showed small bilateral pleural effusions eith bibasilar atelectasis  Cardiac Cath  Procedure date:  11/14/2009  Findings:       Insignificant coranry artery disease with minimal luminal irregularities in the LAD and no significant obstruction of circumflex and right coronary artery and a small area of apical wall akinesis with estimate ejection fraction of 55%

## 2010-04-28 NOTE — Miscellaneous (Signed)
Summary: order for mammogram  Clinical Lists Changes  Problems: Added new problem of UNSPECIFIED BREAST SCREENING (ICD-V76.10) Orders: Added new Test order of Mammogram (Screening) (Mammo) - Signed

## 2010-04-28 NOTE — Discharge Summary (Signed)
Summary: Hospital Discharge Update    Hospital Discharge Update:  Date of Admission: 11/13/2009 Date of Discharge: 11/15/2009  Brief Summary:  Presented with chest pain.  Troponins bumped, was sent to cath, no obstructions found.  Discharged with no new medications.  Lab or other results pending at discharge:  none  Other labs needed at follow-up: please order ECHO as outpatient.  Other follow-up issues:  Has small bilateral pleural effusions of unknown etiology. Please get an outpatient ECHO.  Discharge medications:  HYDROCHLOROTHIAZIDE 25 MG TABS (HYDROCHLOROTHIAZIDE) Take one tab daily NITROSTAT 0.4 MG SUBL (NITROGLYCERIN) prn CLARITIN-D 12 HOUR 5-120 MG XR12H-TAB (LORATADINE-PSEUDOEPHEDRINE) Take one tablet daily for sinuses. PRAVACHOL 20 MG TABS (PRAVASTATIN SODIUM) Take one tablet daily. AMITRIPTYLINE HCL 100 MG TABS (AMITRIPTYLINE HCL) Take 1 tablet by mouth once a Alberson at bedtime CLONAZEPAM 1 MG TABS (CLONAZEPAM) Take one tablet two times a Hauth as needed for anxiety. LABETALOL HCL 200 MG TABS (LABETALOL HCL) Take 3 tablets by mouth twice daily ASPIR-LOW 81 MG TBEC (ASPIRIN) take one tablet by mouth daily OMEPRAZOLE 20 MG CPDR (OMEPRAZOLE) take 1 tab two times a Adachi 15 mins before meals. METOCLOPRAMIDE HCL 5 MG TABS (METOCLOPRAMIDE HCL) before meals  and at bed time as needed BUPROPION HCL 150 MG XR12H-TAB (BUPROPION HCL) Take one tablet by mouth once a Segler for  3 days, then increase to one tablet two times a Levins. continue to take Citalopram for 7 days; then stop.  Other patient instructions:  Your cardiac catheterization did not show any coronary artery disease.  Please come to your appointment Friday August 26 at 9:30 am in the out patient clinic.  Note: Hospital Discharge Medications & Other Instructions handout was printed, one copy for patient and a second copy to be placed in hospital chart.

## 2010-04-28 NOTE — Assessment & Plan Note (Signed)
Summary: Soc. Work  Met with patient in exam room who tells me she is "just not happy."  Please see previous Social Work notes for mental health history and referral to [facility] who set her up with Fam[facility].  The patient is here with her partner today, Hayley Miller who is also a patient here at [facility] and benefited tremendously from counseling at [facility]. However,  Hayley Miller reports to me that she did not have a good experience with [facility].  They saw her about 3 times and she did not get what she needed/ her therapist was not very attentive to her.  Her Medicare insurance also seems to be at issue here.  Apparently there is one female therapist at [facility] who will accept the Medicare but Hayley Miller does not want a female therapist.   I apologized that she did not have a good experience.  I told her and Hayley Miller that finding a Medicare therapist who reduces the $40 copay is somewhat difficult.  However, I called [facility] today and they will bypass the Medicare and charge a small copay based on income to see the patient.  They also have a female therapist.      I have given the patient and Hayley Miller the information and address for [facility] and they are willing to call today.  Hayley Miller can be reached at the number on the chart but is reportedly sleeping alot.  Hayley Miller can be reached at [phone].  SW follow-up to ensure services.

## 2010-04-28 NOTE — Assessment & Plan Note (Signed)
Summary: Cardiology Nuclear Study  Nuclear Med Background Indications for Stress Test: Evaluation for Ischemia   History: Angioplasty, Echo, Heart Catheterization, Myocardial Infarction  History Comments: 09/06 MI LAD 09/06 HeartCath EF 35-40% LAD 50% with residual stenosis 09/06 Angioplasty LAD 09/06 ECHO EF 40-45%  Symptoms: Chest Pain, SOB    Nuclear Pre-Procedure Cardiac Risk Factors: History of Smoking, Hypertension, Lipids Caffeine/Decaff Intake: None NPO After: 7:00 PM Lungs: clear IV 0.9% NS with Angio Cath: 22g     IV Site: (R) AC IV Started by: Irven Baltimore RN Chest Size (in) 34     Cup Size C     Height (in): 63 Weight (lb): 138 BMI: 24.53 Tech Comments: The patient took labetolol 7:00 today, and she has fibromyalgia with knee problems; change to lexiscan. P. Edwards,RN.  Nuclear Med Study 1 or 2 Kobus study:  1 Hayley Miller     Stress Test Type:  Carlton Adam Reading MD:  Kirk Ruths, MD     Referring MD:  B.Waynette Towers Resting Radionuclide:  Technetium 42m Tetrofosmin     Resting Radionuclide Dose:  11.0 mCi  Stress Radionuclide:  Technetium 52m Tetrofosmin     Stress Radionuclide Dose:  33.0 mCi   Stress Protocol   Lexiscan: 0.4 mg   Stress Test Technologist:  Perrin Maltese EMT-P     Nuclear Technologist:  Mariann Laster Deal RT-N  Rest Procedure  Myocardial perfusion imaging was performed at rest 45 minutes following the intravenous administration of Myoview Technetium 66m Tetrofosmin.  Stress Procedure  The patient received IV Lexiscan 0.4 mg over 15-seconds.  Myoview injected at 30-seconds.  There were no significant changes, + for sob, and abdominal pain with infusion.  Quantitative spect images were obtained after a 45 minute delay.  QPS Raw Data Images:  There is interference from nuclear activity from structures below the diaphragm.  This does not affect the ability to read the study. Stress Images:  There is decreased uptake in the apex. Rest Images:  There is  decreased uptake in the apex, slightly less prominent compared to the stress images. Subtraction (SDS):  These findings are consistent with prior apical infarct and mild peri-infarct  ischemia. Transient Ischemic Dilatation:  1.18  (Normal <1.22)  Lung/Heart Ratio:  .26  (Normal <0.45)  Quantitative Gated Spect Images QGS EDV:  86 ml QGS ESV:  31 ml QGS EF:  63 % QGS cine images:  Apical akinesis.   Overall Impression  Exercise Capacity: [place] study with no exercise. BP Response: Normal blood pressure response. Clinical Symptoms: No chest pain ECG Impression: No significant ST segment change suggestive of ischemia. Overall Impression: Abnormal lexiscan nuclear study with small prior apical infarct and mild peri-infarct ischemia.  Appended Document: Cardiology Nuclear Study Low risk; continue medical therapy; f/u in 8 weeks  Appended Document: Cardiology Nuclear Study pt aware of results, follow up appt made

## 2010-04-28 NOTE — Progress Notes (Signed)
Summary: Soc. Work  Dealer placed by: Soc. Work Call placed to: Patient Summary of Call: Patient and friend called.  Patient said she is needing someone to talk to. She is very agitated over the phone and says she might hurt someone. She tells me she went to [facility] before the holidays and they had a problem with her insurance plus she didn't want to speak with a man. She became very angry and repeated over and over that she doesn't want to speak with a man.    Her friend Jefm Petty is with her and I've advised Varney Biles to call 911 if situation becomes out of control.  Varney Biles said that she took away a bat that was in the home. In the meantime I have called [facility] mobile crisis unit to go out to the home and assess and connect to resources.  This is the first time I am using this service and we will see where it leads.  Patient is [employer]. Continued social work follow-up.    Follow-up for Phone Call        Called patient and she said [facility] was coming out by noontime to assist her.  Katy Fitch  May 27, 2009 12:20 PM   Additional Follow-up for Phone Call Additional follow up Details #1::        Called patient today and she said that she met with [facility] yesterday.  They wanted to hospitalize her but she didn't want to be hosp due to various obligations she had and also does not want her family to know about problems and so [facility] set up an appmt with [facility] for tomorrow at 11:30.  The patient agreed that she needed the help and was satisfied with the connection.  The patient also said that her anti-depressant medications were not working and I encouraged her to discuss this with the therapist and ask to be connected to the psychiatrist who is now on staff one Konicek a week at [facility].   Katy Fitch  May 28, 2009 10:30 AM

## 2010-04-28 NOTE — Assessment & Plan Note (Signed)
Summary: f70m/dm  Medications Added AMITRIPTYLINE HCL 50 MG TABS (AMITRIPTYLINE HCL) 1 tab by mouth once daily ASPIR-LOW 81 MG TBEC (ASPIRIN) take one tablet by mouth daily        Primary Provider:  Cassandria Anger MD  CC:  follow up stress test.  History of Present Illness: 50 year old female I initially saw in January of 2011 for evaluation of coronary disease. Patient apparently had a myocardial infarction on a cruise to the [place] in 2006. She was seen at [facility] and ultimately underwent cardiac catheterization in September of 2006. She was found to have an occluded apical LAD lesion. There was a 50% LAD prior to the obstruction. There was no other obstructive disease noted. She had PCI of the LAD at that time. The ejection fraction was 35-40% with apical and inferior akinesis. An echocardiogram at that same time showed an ejection fraction of 40-45%, mild mitral regurgitation, mild to moderate tricuspid regurgitation and a small pericardial effusion. The patient apparently has had intermittent chest pain since her cardiac catheterization. When I saw her previously rescheduled a Myoview. This was performed in January of 2011. Ejection fraction was 63%. There was a small prior apical infarct with mild peri-infarct ischemia. Since I saw her previously she does have dyspnea on exertion relieved with rest. However there is no orthopnea, PND, pedal edema, palpitations or syncope. She continues to have occasional chest pain. It is substernal without radiation. He can occur either at rest or with exertion. It increases with certain movements or laying on her right side. It lasts approximately 10 minutes and resolve spontaneously. This appears to be a chronic issue.  Current Medications (verified): 1)  Ranitidine Hcl 150 Mg Caps (Ranitidine Hcl) .[name].. 1 Po Bid 2)  Hydrochlorothiazide 25 Mg Tabs (Hydrochlorothiazide) .... Take One Tab Daily 3)  Nitrostat 0.4 Mg Subl  (Nitroglycerin) .... Prn 4)  Amlodipine Besylate 5 Mg Tabs (Amlodipine Besylate) .... Take One Tab Daily 5)  Celexa 40 Mg Tabs (Citalopram Hydrobromide) .... Take One Tablet Daily For Depression. 6)  Claritin-D 12 Hour 5-120 Mg Xr12h-Tab (Loratadine-Pseudoephedrine) .... Take One Tablet Daily For Sinuses. 7)  Pravachol 20 Mg Tabs (Pravastatin Sodium) .... Take One Tablet Daily. 8)  Amitriptyline Hcl 50 Mg Tabs (Amitriptyline Hcl) .[name].. 1 Tab By Mouth Once Daily 9)  Clonazepam 1 Mg Tabs (Clonazepam) .... Take One Tablet Two Times A M[place]ombie As Needed For Anxiety. 10)  Labetalol Hcl 200 Mg Tabs (Labetalol Hcl) .... Two Tab By Mouth Twice Daily  Allergies: 1)  Clonidine Hcl 2)  Ibuprofen 3)  Lovastatin (Lovastatin)  Past History:  Past Medical History: Reviewed history from 04/18/2009 and no changes required. Current Problems:  HYPERTENSION (ICD-401.9) HYPERLIPIDEMIA (ICD-272.4) CORONARY ARTERY DISEASE (ICD-414.00)s/p PCI 9/06  MYOCARDIAL INFARCTION (ICD-410.90) CHRONIC PAIN SYNDROME (ICD-338.4) UPPER GASTROINTESTINAL HEMORRHAGE (ICD-578.9)GI bleed 12/2007 due to NSAID DEPRESSION (ICD-311) GERD (ICD-530.81) ANXIETY (ICD-300.00) Fibromyalgia  Past Surgical History: Reviewed history from 09/06/2008 and no changes required. s/p L breast cyst removal 2002  Social History: Reviewed history from 04/18/2009 and no changes required. 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 Also reflux. no fevers or chills, productive cough, hemoptysis, dysphasia, odynophagia, melena, hematochezia, dysuria, hematuria, rash, seizure activity, orthopnea, PND, pedal edema, claudication. Remaining systems are negative.   Vital Signs:  Patient profile:   49  year old female Height:      63 inches Weight:      151 pounds BMI:     26.85 Pulse rate:    90 / minute Resp:     12 per minute BP sitting:   128 / 84  (left arm)  Vitals Entered By: Burnett Kanaris (June 24, 2009 3:29 PM)  Physical Exam  General:  Well-developed well-nourished in no acute distress.  Skin is warm and dry.  HEENT is normal.  Neck is supple. No thyromegaly.  Chest is clear to auscultation with normal expansion.  Cardiovascular exam is regular rate and rhythm.  Abdominal exam nontender or distended. No masses palpated. Extremities show no edema. neuro grossly intact    EKG  Procedure date:  06/24/2009  Findings:      Normal sinus rhythm at a rate of 87. Nonspecific ST change.  Impression & Recommendations:  Problem # 1:  CHEST PAIN (ICD-786.50) Her symptoms are very atypical and most likely not cardiac. Her Myoview has been reviewed. It shows prior apical infarct with trivial peri-infarct ischemia. I will plan medical therapy at this point. Her updated medication list for this problem includes:    Nitrostat 0.4 Mg Subl (Nitroglycerin) .[name]... Prn    Amlodipine Besylate 5 Mg Tabs (Amlodipine besylate) .[name]... Take one tab daily    Labetalol Hcl 200 Mg Tabs (Labetalol hcl) .[name]..[name] Two tab by mouth twice daily    Aspir-low 81 Mg Tbec (Aspirin) .[name]... Take one tablet by mouth daily  Orders: EKG w/ Interpretation (93000)  Problem # 2:  CORONARY ARTERY DISEASE (ICD-414.00) Add enteric-coated aspirin 81 mg p.o. daily. Continue beta blocker. Continue statin. Her updated medication list for this problem includes:    Nitrostat 0.4 Mg Subl (Nitroglycerin) .[name]... Prn    Amlodipine Besylate 5 Mg Tabs (Amlodipine besylate) .[name]... Take one tab daily    Labetalol Hcl 200 Mg Tabs (Labetalol hcl) .[name]..[name] Two tab by mouth twice daily    Aspir-low 81 Mg Tbec (Aspirin) .[name]... Take one tablet by mouth daily  Orders: EKG w/ Interpretation (93000)  Problem # 3:  HYPERTENSION (ICD-401.9) Blood pressure controlled on present medications. Will continue. Her updated  medication list for this problem includes:    Hydrochlorothiazide 25 Mg Tabs (Hydrochlorothiazide) .[name]... Take one tab daily    Amlodipine Besylate 5 Mg Tabs (Amlodipine besylate) .[name]... Take one tab daily    Labetalol Hcl 200 Mg Tabs (Labetalol hcl) .[name]..[name] Two tab by mouth twice daily    Aspir-low 81 Mg Tbec (Aspirin) .[name]... Take one tablet by mouth daily  Problem # 4:  HYPERLIPIDEMIA (ICD-272.4) Continue statin. Lipids and liver monitored by primary care. Her updated medication list for this problem includes:    Pravachol 20 Mg Tabs (Pravastatin sodium) .[name]... Take one tablet daily.  Problem # 5:  FIBROMYALGIA (ICD-729.1)  Problem # 6:  GERD (ICD-530.81)  Her updated medication list for this problem includes:    Ranitidine Hcl 150 Mg Caps (Ranitidine hcl) .[name]... 1 po bid  Problem # 7:  ANXIETY (ICD-300.00)  Patient Instructions: 1)  Your physician recommends that you schedule a follow-up appointment in: 6 months

## 2010-04-28 NOTE — Assessment & Plan Note (Signed)
Summary: acid reflux, N&V 2-3 wks/pcp-bowers/hla   Vital Signs:  Patient profile:   50 year old female Height:      63 inches (160.02 cm) Weight:      157.06 pounds (71.39 kg) BMI:     27.92 Temp:     97 degrees F (36.11 degrees C) oral Pulse rate:   78 / minute BP sitting:   124 / 90  (right arm)  Vitals Entered By: Sander Nephew RN (October 02, 2009 9:30 AM) CC: Depression Is Patient Diabetic? No Pain Assessment Patient in pain? no      Nutritional Status BMI of 25 - 29 = overweight  Have you ever been in a relationship where you felt threatened, hurt or afraid?No   Does patient need assistance? Functional Status Self care Ambulation Normal Comments Says that she had planned to come over and check self in.  Doing ok now.  Swelling in hands and face.  Nausea and vomitinginm her sleep.  Has to jump up quickly while sleeping.  Heartburn.  Aching in the am.  Problems sleeping.  Neds refills on her meds.   Primary Care Provider:  Epimenio Sarin MD  CC:  Depression.  History of Present Illness: Hayley Miller is here today with chief complaints of nausea vomiting and severe reflux symptoms for last 2 weeks. Patient has been having reflux for quite some time but it has become worse in last few weeks when she wakes up in the middle of the night coughing and and had vomited a couple of times. The vomitus has been described as food particles of the last meal. 1-2 episodes every week, no aggravating or relieving factors. She uses 2-3 pillows to keep her head up but it does not really help alot. She is taking rantac. She also complains that she has abdominal pain whenever she eats anything.  Patient is accompanied by her Ffriend and says that she was feeling a little low 2 weeks ago and thought of taking her life but she does not have any suicidal ideation or homicidal ideation at this time. I talked to Verlon Setting and she provided her with all the resources and phone numkbers to call if she feels  like this again.  She is little anxious and says that the meds are not helping alot, I will increase her dose of amitryptaline today which should help.  No other complaints.    Depression History:      The patient is having a depressed mood most of the [place] and has a diminished interest in her usual daily activities.        Suicide risk questions reveal that she wishes that she were dead, she has thought about ending her life, and she has even planned how to end her life.  The patient denies that she feels like life is not worth living.        Comments:  Loss a grandchild 08/29/2009.  Had a therapist.  Does not take Medicare.  Needs a referral.  Wants to talk to a lady.   Preventive Screening-Counseling & Management  Alcohol-Tobacco     Alcohol drinks/Maddalena: 0     Smoking Status: quit     Year Quit: years ago  Problems Prior to Update: 1)  Unspecified Breast Screening  (ICD-V76.10) 2)  Fibromyalgia  (ICD-729.1) 3)  Chest Pain  (ICD-786.50) 4)  Hypertension  (ICD-401.9) 5)  Hyperlipidemia  (ICD-272.4) 6)  Coronary Artery Disease  (ICD-414.00) 7)  Myocardial Infarction  (  ICD-410.90) 8)  Chronic Pain Syndrome  (ICD-338.4) 9)  Knee Pain, Right  (ICD-719.46) 10)  Myalgia  (ICD-729.1) 11)  Headache  (ICD-784.0) 12)  Shoulder Pain  (ICD-719.41) 13)  Sinusitis, Acute  (ICD-461.9) 14)  Upper Gastrointestinal Hemorrhage  (ICD-578.9) 15)  Low Back Pain  (ICD-724.2) 16)  Angina  (ICD-413.9) 17)  Dental Pain  (ICD-525.9) 18)  Depression  (ICD-311) 19)  Gerd  (ICD-530.81) 20)  Anxiety  (ICD-300.00)  Medications Prior to Update: 1)  Ranitidine Hcl 150 Mg Caps (Ranitidine Hcl) .[name].. 1 Po Bid 2)  Hydrochlorothiazide 25 Mg Tabs (Hydrochlorothiazide) .... Take One Tab Daily 3)  Nitrostat 0.4 Mg Subl (Nitroglycerin) .... Prn 4)  Amlodipine Besylate 5 Mg Tabs (Amlodipine Besylate) .... Take One Tab Daily 5)  Celexa 40 Mg Tabs (Citalopram Hydrobromide) .... Take One Tablet Daily For  Depression. 6)  Claritin-D 12 Hour 5-120 Mg Xr12h-Tab (Loratadine-Pseudoephedrine) .... Take One Tablet Daily For Sinuses. 7)  Pravachol 20 Mg Tabs (Pravastatin Sodium) .... Take One Tablet Daily. 8)  Amitriptyline Hcl 50 Mg Tabs (Amitriptyline Hcl) .[name].. 1 Tab By Mouth Once Daily 9)  Clonazepam 1 Mg Tabs (Clonazepam) .... Take One Tablet Two Times A Roedl As Needed For Anxiety. 10)  Labetalol Hcl 200 Mg Tabs (Labetalol Hcl) .... Two Tab By Mouth Twice Daily 11)  Aspir-Low 81 Mg Tbec (Aspirin) .... Take One Tablet By Mouth Daily  Current Medications (verified): 1)  Hydrochlorothiazide 25 Mg Tabs (Hydrochlorothiazide) .... Take One Tab Daily 2)  Nitrostat 0.4 Mg Subl (Nitroglycerin) .... Prn 3)  Celexa 40 Mg Tabs (Citalopram Hydrobromide) .... Take One Tablet Daily For Depression. 4)  Claritin-D 12 Hour 5-120 Mg Xr12h-Tab (Loratadine-Pseudoephedrine) .... Take One Tablet Daily For Sinuses. 5)  Pravachol 20 Mg Tabs (Pravastatin Sodium) .... Take One Tablet Daily. 6)  Amitriptyline Hcl 100 Mg Tabs (Amitriptyline Hcl) .... Take 1 Tablet By Mouth Once A Chea At Bedtime 7)  Clonazepam 1 Mg Tabs (Clonazepam) .... Take One Tablet Two Times A Ayyad As Needed For Anxiety. 8)  Labetalol Hcl 200 Mg Tabs (Labetalol Hcl) .... Two Tab By Mouth Twice Daily 9)  Aspir-Low 81 Mg Tbec (Aspirin) .... Take One Tablet By Mouth Daily 10)  Omeprazole 20 Mg Cpdr (Omeprazole) .... Take 1 Tab Two Times A Jost 15 Mins Before Meals. 11)  Metoclopramide Hcl 5 Mg Tabs (Metoclopramide Hcl) .... Before Meals  and At Bed Time As Needed  Allergies (verified): 1)  Clonidine Hcl 2)  Ibuprofen 3)  Lovastatin (Lovastatin)  Past History:  Past Medical History: Last updated: 04/18/2009 Current Problems:  HYPERTENSION (ICD-401.9) HYPERLIPIDEMIA (ICD-272.4) CORONARY ARTERY DISEASE (ICD-414.00)s/p PCI 9/06  MYOCARDIAL INFARCTION (ICD-410.90) CHRONIC PAIN SYNDROME (ICD-338.4) UPPER GASTROINTESTINAL HEMORRHAGE (ICD-578.9)GI bleed  12/2007 due to NSAID DEPRESSION (ICD-311) GERD (ICD-530.81) ANXIETY (ICD-300.00) Fibromyalgia  Past Surgical History: Last updated: 09/06/2008 s/p L breast cyst removal 2002  Family History: Last updated: 04/18/2009 Mom- COPD, HTN, mother with heart murmur Dad- unk Aunt - colon cancer  Social History: Last updated: 04/18/2009 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    Impression & Recommendations:  Problem # 1:  GERD (ICD-530.81) Assessment Deteriorated Patient's symptoms have been worsening since last few weeks and I will start her on a trial of PPI's for next [redacted] weeks along with Reglan as needed especially at night to help with the reflux symptoms. I will follow it in 2 weeks. The following medications were removed from the medication list:    Ranitidine Hcl 150 Mg Caps (Ranitidine hcl) .[name]... 1 po bid Her updated medication list for this problem includes:    Omeprazole 20 Mg Cpdr (Omeprazole) .[name]... Take 1 tab two times a Rosado 15 mins before meals.  EGD: Location: [facility]   (01/04/2008)  Labs Reviewed: Hgb: 12.5 (03/06/2009)   Hct: 37.9 (03/06/2009)  Problem # 2:  DEPRESSION (ICD-311) Assessment: Comment Only Patient has been up and down in last few weeks but is not actively suicidal at this time.  I asked Verlon Setting to provide her with all the  information and numbers she needs to call if feels suicidal in future. Patient agreed and again confirmed that she is not suicidal at this time. The friend by her side was very supportive. I plan to increase her Amitryptaline to 100 Mg and it can go upto 300 mg daily max dose. Her updated medication list for this problem includes:    Celexa 40 Mg Tabs (Citalopram hydrobromide) .[name]... Take one tablet daily for depression.    Amitriptyline Hcl 100 Mg Tabs (Amitriptyline hcl) .[name]... Take 1 tablet by mouth once a Adel at bedtime    Clonazepam 1 Mg Tabs (Clonazepam) .[name]... Take one tablet two times a [place] as needed for anxiety.  Discussed treatment options, including trial of antidpressant medication. Will refer to behavioral health. Follow-up call in in 24-48 hours and recheck in 2 weeks, sooner as needed. Patient agrees to call if any worsening of symptoms or thoughts of doing harm arise. Verified that the patient has no suicidal ideation at this time.   Problem # 3:  HYPERTENSION (ICD-401.9) Assessment: Improved continue the meds and discontinue Norvasc. We can add that in future if her BP is still high. The following medications were removed from the medication list:    Amlodipine Besylate 5 Mg Tabs (Amlodipine besylate) .[name]... Take one tab daily Her updated medication list for this problem includes:    Hydrochlorothiazide 25 Mg Tabs (Hydrochlorothiazide) .[name]... Take one tab daily    Labetalol Hcl 200 Mg Tabs (Labetalol hcl) .[name]..[name] Two tab by mouth twice daily  BP today: 124/90 Prior BP: 128/84 (06/24/2009)  Prior 10 Yr Risk Heart Disease: N/A (09/06/2008)  Labs Reviewed: K+: 4.3 (05/16/2009) Creat: : 0.93 (05/16/2009)   Chol: 161 (05/16/2009)   HDL: 75 (05/16/2009)   LDL: 65 (05/16/2009)   TG: 105 (05/16/2009)  Problem # 4:  HYPERLIPIDEMIA (ICD-272.4) Assessment: Improved Well controlled at this time. Her updated medication list for this problem includes:    Pravachol 20 Mg Tabs (Pravastatin  sodium) .[name]... Take one tablet daily.  Labs Reviewed: SGOT: 13 (05/16/2009)   SGPT: <8 U/L (05/16/2009)  Prior 10 Yr Risk Heart Disease: N/A (09/06/2008)   HDL:75 (05/16/2009), 61 (03/06/2009)  LDL:65 (05/16/2009), 118 (03/06/2009)  Chol:161 (05/16/2009), 194 (03/06/2009)  Trig:105 (05/16/2009), 75 (03/06/2009)  Complete Medication List: 1)  Hydrochlorothiazide 25 Mg Tabs (Hydrochlorothiazide) .... Take one tab daily 2)  Nitrostat 0.4 Mg Subl (Nitroglycerin) .... Prn 3)  Celexa 40 Mg Tabs (Citalopram hydrobromide) .... Take one tablet daily for depression. 4)  Claritin-d 12 Hour 5-120 Mg Xr12h-tab (Loratadine-pseudoephedrine) .... Take one tablet daily for sinuses. 5)  Pravachol 20 Mg Tabs (Pravastatin sodium) .... Take one tablet daily. 6)  Amitriptyline Hcl 100 Mg Tabs (Amitriptyline hcl) .... Take 1 tablet by mouth once a [place] at bedtime 7)  Clonazepam 1 Mg Tabs (Clonazepam) .... Take one tablet two times a Sand as needed for anxiety. 8)  Labetalol Hcl 200 Mg Tabs (Labetalol hcl) .... Two tab by mouth twice daily 9)  Aspir-low 81 Mg Tbec (Aspirin) .... Take one tablet by mouth daily 10)  Omeprazole 20 Mg Cpdr (Omeprazole) .... Take 1 tab two times a Flock 15 mins before meals. 11)  Metoclopramide Hcl 5 Mg Tabs (Metoclopramide hcl) .... Before meals  and at bed time as needed  Patient Instructions: 1)  Please schedule a follow-up appointment in 6 months. 2)  Please schedule a follow-up appointment as needed. 3)  Please call the clinic or the call the helpline number whenever you feel depressed. 4)  Please come to ED or call 911 if you feel that you may harm yourself. 5)  I have increased your Amitryptaline to 100 mg at night, you should take 2 50mg  tabs at night untill you are finished with them and pick the new prescription. 6)  It is important that you exercise regularly at least 20 minutes 5 times a week. If you develop chest pain, have severe difficulty breathing, or feel very tired ,  stop exercising immediately and seek medical attention. 7)  Take an Aspirin every Roza. 8)  Check your Blood Pressure regularly. If it is above:140/90 you should make an appointment. Prescriptions: LABETALOL HCL 200 MG TABS (LABETALOL HCL) two tab by mouth twice daily  #120 x 12   Entered and Authorized by:   Janell Quiet MD   Signed by:   Janell Quiet MD on 10/02/2009   Method used:   Print then Give to Patient   RxID:   [id number] AMITRIPTYLINE HCL 100 MG TABS (AMITRIPTYLINE HCL) Take 1 tablet by mouth once a Logan at bedtime  #30 x 3   Entered and Authorized by:   Janell Quiet MD   Signed by:   Janell Quiet MD on 10/02/2009   Method used:   Print then Give to Patient   RxID:   [id number] CLONAZEPAM 1 MG TABS (CLONAZEPAM) Take one tablet two times a Dowdy as needed for anxiety.  #64 x 0   Entered and Authorized by:   Janell Quiet MD   Signed by:   Janell Quiet MD on 10/02/2009   Method used:   Print then Give to Patient   RxID:   [id number] PRAVACHOL 20 MG TABS (PRAVASTATIN SODIUM) Take one tablet daily.  #32 x 11   Entered and Authorized by:   Janell Quiet MD   Signed by:   Janell Quiet MD on 10/02/2009   Method used:   Print then Give to Patient   RxID:   [id number] HYDROCHLOROTHIAZIDE 25 MG TABS (HYDROCHLOROTHIAZIDE) Take one tab daily  #30 x 11   Entered and Authorized by:   Janell Quiet MD   Signed by:   Janell Quiet MD on 10/02/2009   Method used:   Print then Give to Patient   RxID:   [id number] CELEXA 40 MG TABS (CITALOPRAM HYDROBROMIDE) Take one tablet daily for depression.  #  32 x 6   Entered and Authorized by:   Janell Quiet MD   Signed by:   Janell Quiet MD on 10/02/2009   Method used:   Print then Give to Patient   RxID:   [id number] CLARITIN-D 12 HOUR 5-120 MG XR12H-TAB (LORATADINE-PSEUDOEPHEDRINE) Take one tablet daily for sinuses.  #32 x 3   Entered and Authorized by:   Janell Quiet MD   Signed by:   Janell Quiet MD on 10/02/2009   Method used:   Print then  Give to Patient   RxID:   [id number] METOCLOPRAMIDE HCL 5 MG TABS (METOCLOPRAMIDE HCL) before meals  and at bed time as needed  #60 x 1   Entered and Authorized by:   Janell Quiet MD   Signed by:   Janell Quiet MD on 10/02/2009   Method used:   Print then Give to Patient   RxID:   [id number] OMEPRAZOLE 20 MG CPDR (OMEPRAZOLE) take 1 tab two times a [place] 15 mins before meals.  #60 x 11   Entered and Authorized by:   Janell Quiet MD   Signed by:   Janell Quiet MD on 10/02/2009   Method used:   Print then Give to Patient   RxID:   [id number]   Prevention & Chronic Care Immunizations   Influenza vaccine: Fluvax 3+  (01/12/2008)   Influenza vaccine deferral: Deferred  (10/02/2009)    Tetanus booster: Not documented   Td booster deferral: Deferred  (10/02/2009)    Pneumococcal vaccine: Not documented  Other Screening   Pap smear: Not documented   Pap smear action/deferral: Deferred  (10/02/2009)    Mammogram: BI-RADS CATEGORY 3:  Probably benign finding(s) - short interval^MM DIGITAL DIAGNOSTIC BILAT  (08/28/2009)   Smoking status: quit  (10/02/2009)  Lipids   Total Cholesterol: 161  (05/16/2009)   Lipid panel action/deferral: Lipid Panel ordered   LDL: 65  (05/16/2009)   LDL Direct: 125.0  (06/21/2008)   HDL: 75  (05/16/2009)   Triglycerides: 105  (05/16/2009)    SGOT (AST): 13  (05/16/2009)   BMP action: Ordered   SGPT (ALT): <8 U/L  (05/16/2009)   Alkaline phosphatase: 45  (05/16/2009)   Total bilirubin: 0.4  (05/16/2009)    Lipid flowsheet reviewed?: Yes   Progress toward LDL goal: At goal  Hypertension   Last Blood Pressure: 124 / 90  (10/02/2009)   Serum creatinine: 0.93  (05/16/2009)   BMP action: Ordered   Serum potassium 4.3  (05/16/2009)    Hypertension flowsheet reviewed?: Yes   Progress toward BP goal: At goal  Self-Management Support :   Personal Goals (by the next clinic visit) :      Personal blood pressure goal: 140/90  (10/02/2009)      Personal LDL goal: 100  (10/02/2009)    Patient will work on the following items until the next clinic visit to reach self-care goals:     Medications and monitoring: take my medicines every [place], bring all of my medications to every visit  (10/02/2009)     Eating: drink diet soda or water instead of juice or soda, eat more vegetables, use fresh or frozen vegetables, eat foods that are low in salt, eat baked foods instead of fried foods, eat fruit for snacks and desserts, limit or avoid alcohol  (10/02/2009)     Activity: take a 30 minute walk every [place]  (10/02/2009)    Hypertension self-management support: Written self-care plan, Education handout, Pre-printed educational material  (10/02/2009)  Hypertension self-care plan printed.   Hypertension education handout printed    Lipid self-management support: Written self-care plan, Education handout, Pre-printed educational material  (10/02/2009)   Lipid self-care plan printed.   Lipid education handout printed    Vital Signs:  Patient profile:   50 year old female Height:      63 inches (160.02 cm) Weight:      157.06 pounds (71.39 kg) BMI:     27.92 Temp:     97 degrees F (36.11 degrees C) oral Pulse rate:   78 / minute BP sitting:   124 / 90  (right arm)  Vitals Entered By: Sander Nephew RN (October 02, 2009 9:30 AM)

## 2010-04-28 NOTE — Progress Notes (Signed)
Summary: refill/gg  Phone Note Refill Request  on June 18, 2009 10:03 AM  Refills Requested: Medication #1:  CLONAZEPAM 1 MG TABS Take one tablet two times a Savitz as needed for anxiety.   Last Refilled: 05/21/2009  Method Requested: Fax to [facility] Initial call taken by: Gevena Cotton RN,  June 18, 2009 10:03 AM    Prescriptions: CLONAZEPAM 1 MG TABS (CLONAZEPAM) Take one tablet two times a Bier as needed for anxiety.  #64 x 0   Entered and Authorized by:   Myrtis Ser MD   Signed by:   Myrtis Ser MD on 06/19/2009   Method used:   Handwritten   [id number]
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
## 2010-04-28 NOTE — Progress Notes (Signed)
Summary: Refill/gh  Phone Note Refill Request Message from:  Fax from Pharmacy on July 21, 2009 9:25 AM  Refills Requested: Medication #1:  AMITRIPTYLINE HCL 50 MG TABS 1 tab by mouth once daily   Last Refilled: 05/21/2009  Method Requested: Electronic Initial call taken by: Sander Nephew RN,  July 21, 2009 9:26 AM    Prescriptions: AMITRIPTYLINE HCL 50 MG TABS (AMITRIPTYLINE HCL) 1 tab by mouth once daily  #30 Tablet x 6   Entered and Authorized by:   Myrtis Ser MD   Signed by:   Myrtis Ser MD on 07/22/2009   Method used:   Electronically to        [facility] ([facility]ail)       [address]       [place], [place]  [address]       Ph: [phone]       Fax: [phone]   [place]:   [phone]

## 2010-04-28 NOTE — Progress Notes (Signed)
Summary: Refill/gh  Phone Note Refill Request Message from:  Fax from Pharmacy on Jul 30, 2009 11:08 AM  Refills Requested: Medication #1:  Promethazine 12.5 mg tablets 1-2 po 4 times daily for nausea   Last Refilled: 08/30/2008  Medication #2:  HYDROCHLOROTHIAZIDE 25 MG TABS Take one tab daily  Method Requested: Electronic Initial call taken by: Sander Nephew RN,  Jul 30, 2009 11:08 AM    Prescriptions: HYDROCHLOROTHIAZIDE 25 MG TABS (HYDROCHLOROTHIAZIDE) Take one tab daily  #30 x 3   Entered and Authorized by:   Myrtis Ser MD   Signed by:   Myrtis Ser MD on 07/30/2009   Method used:   Electronically to        [facility] (retail)       [address]       [place], [place]  [address]       Ph: [phone]       Fax: [phone]   RxID:   [id number]

## 2010-04-28 NOTE — Letter (Signed)
Summary: Appointment - Missed  [facility], [place]  [address] [place]   [place], [place] [address]   Phone: [phone]  Fax: [phone]        February 20, 2010 MRN: [id number]      [facility] [address] [place], [place]  [address]     Dear [name],  [place] records indicate you missed your appointment on January 22, 2010 with Dr. Stanford Breed.  It is very important that we reach you to reschedule this appointment. We look forward to participating in your health care needs. Please contact us at the number listed above at your earliest convenience to reschedule this appointment.     Sincerely,    Public relations account executive

## 2010-04-28 NOTE — Progress Notes (Signed)
Summary: Nuclear Prre-Procedure  Phone Note Outgoing Call   Call placed by: Perrin Maltese, EMT-P,  April 22, 2009 11:56 AM Summary of Call: Left message with information on [employer] Information Sheet (see scanned document for details).      Nuclear Med Background Indications for Stress Test: Evaluation for Ischemia   History: Angioplasty, Echo, Heart Catheterization, Myocardial Infarction  History Comments: 09/06 MI LAD 09/06 HeartCath EF 35-40% LAD 50% with residual stenosis 09/06 Angioplasty LAD 09/06 ECHO EF 40-45%  Symptoms: Chest Pain, SOB    Nuclear Pre-Procedure Cardiac Risk Factors: History of Smoking, Hypertension, Lipids Height (in): 98  Nuclear Med Study Referring MD:  B.Crenshaw

## 2010-04-28 NOTE — Assessment & Plan Note (Signed)
Summary: acute-throing up for 3 days/swelling feet/cfb(bowers)   Vital Signs:  Patient profile:   50 year old female Height:      63 inches Weight:      169.6 pounds BMI:     30.15 Temp:     97.3 degrees F Pulse rate:   76 / minute BP sitting:   132 / 90  (right arm)  Vitals Entered By: Silverio Decamp NT II (November 13, 2009 1:38 PM) CC: chest pain x 2 days, swelling in hand and feet, and face, Depression, trouble sleeping Is Patient Diabetic? No Pain Assessment Patient in pain? no      Nutritional Status BMI of > 30 = obese  Have you ever been in a relationship where you felt threatened, hurt or afraid?No   Does patient need assistance? Functional Status Self care Ambulation Normal   Primary Care Provider:  Epimenio Sarin MD  CC:  chest pain x 2 days, swelling in hand and feet, and face, Depression, and trouble sleeping.  History of Present Illness: 51 y/o AA woman with a Hx of CAD/MI and sp LAD PCI in 2006, presents with CP induced at rest and relieved with NTG and ASA;exertional SOB and leg swelling. Patient denies any cough, wheezing, fever, chills or diapheresis. Notes N/V x 3 days triggered by mouth intake;  Denies abdominal pain, diarrhea. Reprots constipation.Last BM on 11/12/2009 which was hard in consistency; no BRB or melena. CP is described as sharp, midsternal, lasts for several minutes, no radiation. CP is "similar to the pain that she had in 2006 with 1st MI."  Depression History:      The patient denies a depressed mood most of the Tessier and a diminished interest in her usual daily activities.         Preventive Screening-Counseling & Management  Alcohol-Tobacco     Alcohol drinks/Frix: 0     Smoking Status: quit     Year Quit: 2004  Caffeine-Diet-Exercise     Does Patient Exercise: yes     Type of exercise: walking  Problems Prior to Update: 1)  Unspecified Breast Screening  (ICD-V76.10) 2)  Fibromyalgia  (ICD-729.1) 3)  Chest Pain  (ICD-786.50) 4)   Hypertension  (ICD-401.9) 5)  Hyperlipidemia  (ICD-272.4) 6)  Coronary Artery Disease  (ICD-414.00) 7)  Myocardial Infarction  (ICD-410.90) 8)  Chronic Pain Syndrome  (ICD-338.4) 9)  Knee Pain, Right  (ICD-719.46) 10)  Myalgia  (ICD-729.1) 11)  Headache  (ICD-784.0) 12)  Shoulder Pain  (ICD-719.41) 13)  Sinusitis, Acute  (ICD-461.9) 14)  Upper Gastrointestinal Hemorrhage  (ICD-578.9) 15)  Low Back Pain  (ICD-724.2) 16)  Angina  (ICD-413.9) 17)  Dental Pain  (ICD-525.9) 18)  Depression  (ICD-311) 19)  Gerd  (ICD-530.81) 20)  Anxiety  (ICD-300.00)  Current Problems (verified): 1)  Weight Gain  (ICD-783.1) 2)  Weight Gain  (ICD-783.1) 3)  Unspecified Breast Screening  (ICD-V76.10) 4)  Fibromyalgia  (ICD-729.1) 5)  Chest Pain  (ICD-786.50) 6)  Hypertension  (ICD-401.9) 7)  Hyperlipidemia  (ICD-272.4) 8)  Coronary Artery Disease  (ICD-414.00) 9)  Myocardial Infarction  (ICD-410.90) 10)  Chronic Pain Syndrome  (ICD-338.4) 11)  Knee Pain, Right  (ICD-719.46) 12)  Myalgia  (ICD-729.1) 13)  Headache  (ICD-784.0) 14)  Shoulder Pain  (ICD-719.41) 15)  Sinusitis, Acute  (ICD-461.9) 16)  Upper Gastrointestinal Hemorrhage  (ICD-578.9) 17)  Low Back Pain  (ICD-724.2) 18)  Angina  (ICD-413.9) 19)  Dental Pain  (ICD-525.9) 20)  Depression  (ICD-311) 21)  Gerd  (ICD-530.81) 22)  Anxiety  (ICD-300.00)  Medications Prior to Update: 1)  Hydrochlorothiazide 25 Mg Tabs (Hydrochlorothiazide) .... Take One Tab Daily 2)  Nitrostat 0.4 Mg Subl (Nitroglycerin) .... Prn 3)  Celexa 40 Mg Tabs (Citalopram Hydrobromide) .... Take One Tablet Daily For Depression. 4)  Claritin-D 12 Hour 5-120 Mg Xr12h-Tab (Loratadine-Pseudoephedrine) .... Take One Tablet Daily For Sinuses. 5)  Pravachol 20 Mg Tabs (Pravastatin Sodium) .... Take One Tablet Daily. 6)  Amitriptyline Hcl 100 Mg Tabs (Amitriptyline Hcl) .... Take 1 Tablet By Mouth Once A Cocke At Bedtime 7)  Clonazepam 1 Mg Tabs (Clonazepam) .... Take One  Tablet Two Times A Hardaway As Needed For Anxiety. 8)  Labetalol Hcl 200 Mg Tabs (Labetalol Hcl) .... Two Tab By Mouth Twice Daily 9)  Aspir-Low 81 Mg Tbec (Aspirin) .... Take One Tablet By Mouth Daily 10)  Omeprazole 20 Mg Cpdr (Omeprazole) .... Take 1 Tab Two Times A Caissie 15 Mins Before Meals. 11)  Metoclopramide Hcl 5 Mg Tabs (Metoclopramide Hcl) .... Before Meals  and At Bed Time As Needed  Allergies (verified): 1)  Clonidine Hcl 2)  Ibuprofen 3)  Lovastatin (Lovastatin)  Past History:  Past Medical History: Last updated: 04/18/2009 Current Problems:  HYPERTENSION (ICD-401.9) HYPERLIPIDEMIA (ICD-272.4) CORONARY ARTERY DISEASE (ICD-414.00)s/p PCI 9/06  MYOCARDIAL INFARCTION (ICD-410.90) CHRONIC PAIN SYNDROME (ICD-338.4) UPPER GASTROINTESTINAL HEMORRHAGE (ICD-578.9)GI bleed 12/2007 due to NSAID DEPRESSION (ICD-311) GERD (ICD-530.81) ANXIETY (ICD-300.00) Fibromyalgia  Past Surgical History: Last updated: 09/06/2008 s/p L breast cyst removal 2002  Family History: Last updated: 04/18/2009 Mom- COPD, HTN, mother with heart murmur Dad- unk Aunt - colon cancer  Social History: Last updated: 04/18/2009 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  The patient denies anorexia, fever, weight loss, weight gain, vision loss, decreased hearing, hoarseness, syncope, prolonged cough, headaches, hemoptysis, abdominal pain, melena, hematochezia, severe indigestion/heartburn, hematuria, incontinence, genital sores, muscle weakness, suspicious skin lesions, transient blindness, difficulty walking, depression, unusual weight change, abnormal bleeding, enlarged lymph nodes, angioedema, breast masses, and  testicular masses.    Physical Exam  General:  alert and well-developed.   Head:  normocephalic and atraumatic.   Eyes:  vision grossly intact, pupils equal, pupils round, and pupils reactive to light.   Ears:  no external deformities.   Nose:  no external deformity.   Mouth:  good dentition and pharynx pink and moist.   Neck:  supple, full ROM, and no masses.   Lungs:  Normal respiratory effort, chest expands symmetrically. Lungs are clear to auscultation, no crackles or wheezes. Heart:  Normal rate and regular rhythm. S1 and S2 normal without gallop, murmur, click, rub or other extra sounds. Abdomen:  soft, non-tender, and normal bowel sounds.   Msk:  5/5 strength throughout Pulses:  R and L carotid,radial,femoral,dorsalis pedis and posterior tibial pulses are full and equal bilaterally Extremities:  No clubbing, cyanosis, edema, or deformity noted with normal full range of motion of all joints.   Neurologic:  No cranial nerve deficits noted. Station and gait are normal. Plantar reflexes are down-going bilaterally. DTRs are symmetrical throughout. Sensory, motor and coordinative functions appear intact. Skin:  Intact without suspicious lesions or rashes Cervical Nodes:  No lymphadenopathy noted Psych:  normally interactive, depressed  affect, and slightly anxious.     Impression & Recommendations:  Problem # 1:  CHEST PAIN (ICD-786.50) Given Hx of CAD and presentation, concenring for an unstable angina. Spoke with Dr. Eppie Gibson. Will admit the patient to a telemetry floor for a work up. Dr. Donnella Bi (on-call R3) notified. Will increase Labetolol dose to 600 mg by mouth two times a Douthat for a better blockade. Cosnder additing ACEI. Continue with the rest of meds. Orders: 12 Lead EKG (12 Lead EKG)  Complete Medication List: 1)  Hydrochlorothiazide 25 Mg Tabs (Hydrochlorothiazide) .... Take one tab daily 2)  Nitrostat 0.4 Mg Subl (Nitroglycerin) .... Prn 3)  Claritin-d 12 Hour 5-120 Mg  Xr12h-tab (Loratadine-pseudoephedrine) .... Take one tablet daily for sinuses. 4)  Pravachol 20 Mg Tabs (Pravastatin sodium) .... Take one tablet daily. 5)  Amitriptyline Hcl 100 Mg Tabs (Amitriptyline hcl) .... Take 1 tablet by mouth once a Auzenne at bedtime 6)  Clonazepam 1 Mg Tabs (Clonazepam) .... Take one tablet two times a Marlar as needed for anxiety. 7)  Labetalol Hcl 200 Mg Tabs (Labetalol hcl) .... Take 3 tablets by mouth twice daily 8)  Aspir-low 81 Mg Tbec (Aspirin) .... Take one tablet by mouth daily 9)  Omeprazole 20 Mg Cpdr (Omeprazole) .... Take 1 tab two times a Hosman 15 mins before meals. 10)  Metoclopramide Hcl 5 Mg Tabs (Metoclopramide hcl) .... Before meals  and at bed time as needed 11)  Bupropion Hcl 150 Mg Xr12h-tab (Bupropion hcl) .... Take one tablet by mouth once a Danford for  3 days, then increase to one tablet two times a Lisenby. continue to take citalopram for 7 days; then stop.  Other Orders: T-TSH [id number]) Physical Therapy Referral (PT) Prescriptions: LABETALOL HCL 200 MG TABS (LABETALOL HCL) Take 3 tablets by mouth twice daily  #60 x 11   Entered and Authorized by:   Milana Obey MD   Signed by:   Milana Obey MD on 11/13/2009   Method used:   Electronically to        [facility] (retail)       [address]       [place], [place]  [address]       Ph: [phone]       Fax: [phone]   RxID:   [id number]   Prevention & Chronic Care Immunizations   Influenza vaccine: Fluvax 3+  (01/12/2008)   Influenza vaccine deferral: Deferred  (10/02/2009)    Tetanus booster: Not documented   Td booster deferral: Deferred  (10/02/2009)    Pneumococcal vaccine: Not documented  Other Screening   Pap smear: Not documented   Pap smear action/deferral: Deferred  (10/02/2009)    Mammogram: BI-RADS CATEGORY 3:  Probably benign finding(s) - short interval^MM DIGITAL DIAGNOSTIC BILAT  (08/28/2009)   Smoking status: quit   (11/13/2009)  Lipids   Total Cholesterol: 161  (05/16/2009)   Lipid panel action/deferral: Lipid Panel ordered   LDL: 65  (05/16/2009)   LDL Direct: 125.0  (06/21/2008)   HDL: 75  (05/16/2009)   Triglycerides: 105  (05/16/2009)    SGOT (AST): 13  (05/16/2009)   BMP action: Ordered   SGPT (ALT): <8 U/L  (05/16/2009)   Alkaline phosphatase: 45  (05/16/2009)   Total bilirubin: 0.4  (05/16/2009)  Hypertension   Last Blood Pressure: 132 / 90  (11/13/2009)   Serum creatinine: 0.93  (05/16/2009)   BMP action: Ordered   Serum  potassium 4.3  (05/16/2009)  Self-Management Support :   Personal Goals (by the next clinic visit) :      Personal blood pressure goal: 140/90  (10/02/2009)     Personal LDL goal: 100  (10/02/2009)    Patient will work on the following items until the next clinic visit to reach self-care goals:     Medications and monitoring: take my medicines every Deman, bring all of my medications to every visit  (11/13/2009)     Eating: drink diet soda or water instead of juice or soda, eat more vegetables, use fresh or frozen vegetables, eat foods that are low in salt, eat baked foods instead of fried foods, eat fruit for snacks and desserts, limit or avoid alcohol  (11/13/2009)     Activity: take a 30 minute walk every Haertel  (10/02/2009)    Hypertension self-management support: Education handout, Resources for patients handout  (11/13/2009)   Hypertension education handout printed    Lipid self-management support: Education handout, Resources for patients handout  (11/13/2009)     Lipid education handout printed      Resource handout printed.  Process Orders Check Orders Results:     [facility]: Check successful Tests Sent for requisitioning (November 13, 2009 2:16 PM):     11/13/2009: [facility] -- T-TSH [phone] (signed)     Appended Document: acute-throing up for 3 days/swelling feet/cfb(bowers) #22G angiocath NSL started in  right wrist; good blood return; site unremarkable.   Morrison Old RN  November 13, 2009 3:33 PM

## 2010-04-28 NOTE — Progress Notes (Signed)
Summary: pt still dosen't have meds    Phone Note Refill Request Message from:  Patient  Refills Requested: Medication #1:  LABETALOL HCL 200 MG TABS two tab by mouth twice daily Initial call taken by: Shelda Pal,  April 29, 2009 11:19 AM  Follow-up for Phone Call        Called Pharm should be ready in about 45 mins. Called Pt. she can't afford 45 dollars. I instructed her to call Deb. Mathis reguarding the matter. Follow-up by: Lynden Ang,  April 29, 2009 11:36 AM     Appended Document: pt still dosen't have meds  pt is unable to afford labetalol and would like to change to something else. will foward to dr Stanford Breed for his review.  Appended Document: pt still dosen't have meds  dc labetalol; coreg 12 .5 mg by mouth two times a Ildefonso; follow bp closely at home; will increase coreg as needed.  Appended Document: pt still dosen't have meds  spoke with pt, she picked up the labetalol last night. it was 5 dollars. she no longer needs to change drugs.

## 2010-04-28 NOTE — Progress Notes (Signed)
Summary: Soc. Work  Dealer placed by: Social Work Summary of Call: I had left a message for patient to call SW prior to the holidays but no return phone call.  I left a message with her today to call me.  I also called [facility] and spoke with Butch Penny there as well as the fee setters.  Mental Health accepts Medicare.  What they do is bill Medicare and then follow a sliding scale fee.  The fee setter said that Hayley Miller had a 0% fee based on her income.   So unclear as to why pt unable to continue with Mental Health services.   Follow-up for Phone Call        Left message for patient to call me.  Katy Fitch  April 04, 2009 10:50 AM

## 2010-04-28 NOTE — Assessment & Plan Note (Signed)
Summary: np6/angina/kfw  Medications Added LABETALOL HCL 200 MG TABS (LABETALOL HCL) two tab by mouth twice daily CARVEDILOL 25 MG TABS (CARVEDILOL) Take one tablet by mouth twice a Trott PROMETHAZINE HCL 12.5 MG TABS (PROMETHAZINE HCL) as needed        Primary Provider:  Cassandria Anger MD   History of Present Illness: 50 year old female for evaluation of coronary disease. Patient apparently had a myocardial infarction on a cruise to the [place] in 2006. She was seen at [facility] and ultimately underwent cardiac catheterization in September of 2006. She was found to have an occluded apical LAD lesion. There was a 50% LAD prior to the obstruction. There was no other obstructive disease noted. She had PCI of the LAD at that time. The ejection fraction was 35-40% with apical and inferior akinesis. An echocardiogram at that same time showed an ejection fraction of 40-45%, mild mitral regurgitation, mild to moderate tricuspid regurgitation and a small pericardial effusion. The patient apparently has had intermittent chest pain since her cardiac catheterization. It is in the left chest area and described as a sharp sensation. It lasts for several seconds and resolved and returned. She then takes nitroglycerin and it is relieved. The pain is not pleuritic or positional. It is not clearly exertional nor is it related to food. There is no associated nausea, vomiting or diaphoresis but there is shortness of breath. Because of the above we are asked to further evaluate.  Current Medications (verified): 1)  Ranitidine Hcl 150 Mg Caps (Ranitidine Hcl) .[name].. 1 Po Bid 2)  Vitamin D3 1000 Unit  Tabs (Cholecalciferol) .[name].. 1 By Mouth Daily 3)  Hydrochlorothiazide 25 Mg Tabs (Hydrochlorothiazide) .... Take One Tab Daily 4)  Nitrostat 0.4 Mg Subl (Nitroglycerin) .... Prn 5)  Hydrocodone-Acetaminophen 10-325 Mg Tabs (Hydrocodone-Acetaminophen) .[name].. 1 By Mouth Up To 4 Time Per Zentz As Needed For  Pain 6)  Amlodipine Besylate 5 Mg Tabs (Amlodipine Besylate) .... Take One Tab Daily 7)  Celexa 40 Mg Tabs (Citalopram Hydrobromide) .... Take One Tablet Daily For Depression. 8)  Claritin-D 12 Hour 5-120 Mg Xr12h-Tab (Loratadine-Pseudoephedrine) .... Take One Tablet Daily For Sinuses. 9)  Pravachol 20 Mg Tabs (Pravastatin Sodium) .... Take One Tablet Daily. 10)  Amitriptyline Hcl 100 Mg Tabs (Amitriptyline Hcl) .... Take One Tablet At Bedtime. 11)  Clonazepam 1 Mg Tabs (Clonazepam) .... Take One Tablet Two Times A Longanecker As Needed For Anxiety. 12)  Labetalol Hcl 200 Mg Tabs (Labetalol Hcl) .[name].. 1 Tab By Mouth Three Times A Lindstrom 13)  Carvedilol 25 Mg Tabs (Carvedilol) .... Take One Tablet By Mouth Twice A Terpening 14)  Promethazine Hcl 12.5 Mg Tabs (Promethazine Hcl) .... As Needed  Allergies: 1)  Clonidine Hcl 2)  Ibuprofen 3)  Lovastatin (Lovastatin)  Past History:  Past Medical History: Current Problems:  HYPERTENSION (ICD-401.9) HYPERLIPIDEMIA (ICD-272.4) CORONARY ARTERY DISEASE (ICD-414.00)s/p PCI 9/06  MYOCARDIAL INFARCTION (ICD-410.90) CHRONIC PAIN SYNDROME (ICD-338.4) UPPER GASTROINTESTINAL HEMORRHAGE (ICD-578.9)GI bleed 12/2007 due to NSAID DEPRESSION (ICD-311) GERD (ICD-530.81) ANXIETY (ICD-300.00) Fibromyalgia  Past Surgical History: Reviewed history from 09/06/2008 and no changes required. s/p L breast cyst removal 2002  Family History: Reviewed history from 09/06/2008 and no changes required. Mom- COPD, HTN, mother with heart murmur Dad- unk Aunt - colon cancer  Social History: Reviewed history from 10/07/2008 and no changes required. 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 Remaining systems are negative.   Vital Signs:  Patient profile:   50 year old female Height:      63 inches Weight:      140 pounds BMI:     24.89 Pulse rate:   80 / minute Resp:     12 per minute BP sitting:   115 / 70  (left arm)  Vitals Entered By: Burnett Kanaris (April 18, 2009 11:24 AM)  Physical Exam  General:  Well developed/well nourished in NAD Skin warm/dry Patient not depressed No peripheral clubbing Back-normal HEENT-normal/normal eyelids Neck supple/normal carotid upstroke bilaterally; no bruits; no JVD; no thyromegaly chest - CTA/ normal expansion CV - RRR/normal S1 and S2; no murmurs, rubs or gallops;  PMI nondisplaced Abdomen -NT/ND, no HSM, no mass, + bowel sounds, no bruit 2+ femoral pulses, no bruits Ext-no edema, chords, 2+ DP Neuro-grossly nonfocal     EKG  Procedure date:  03/06/2009  Findings:      Normal sinus rhythm at a rate of 73. Left axis deviation. No ST changes.  Impression & Recommendations:  Problem # 1:  HYPERTENSION (ICD-401.9)  The patient's blood pressure is controlled. However she is on 2 different beta blockers. I will change her labetalol to 400 mg p.o. b.i.d. I will wean her carvedilol to off (12.5 mg p.o. b.i.d. for 2 days, 6.25 mg p.o. b.i.d. for 2 days and then discontinue). She will monitor her blood pressure at home and we can increase her Norvasc or her labetalol as needed. Her primary care physician is following her renal function and potassium. Her updated medication list for this problem includes:    Hydrochlorothiazide 25 Mg Tabs (Hydrochlorothiazide) .[name]... Take one tab daily    Amlodipine Besylate 5 Mg Tabs (Amlodipine besylate) .[name]... Take one tab daily    Labetalol Hcl 200 Mg Tabs (Labetalol hcl) .[name]..[name] Two tab by mouth twice daily    Carvedilol 25 Mg Tabs (Carvedilol) .[name]... Take one tablet by mouth twice a Wachob  Her updated medication list for this problem  includes:    Hydrochlorothiazide 25 Mg Tabs (Hydrochlorothiazide) .[name]... Take one tab daily    Amlodipine Besylate 5 Mg Tabs (Amlodipine besylate) .[name]... Take one tab daily    Labetalol Hcl 200 Mg Tabs (Labetalol hcl) .[name]..[name] Two tab by mouth twice daily    Carvedilol 25 Mg Tabs (Carvedilol) .[name]... Take one tablet by mouth twice a Arista  Problem # 2:  HYPERLIPIDEMIA (ICD-272.4)  Continue statin. Lipids and liver monitored by her primary care. Her updated medication list for this problem includes:    Pravachol 20 Mg Tabs (Pravastatin sodium) .[name]... Take one tablet daily.  Her updated medication list for this problem includes:    Pravachol 20 Mg Tabs (Pravastatin sodium) .[name]... Take one tablet daily.  Problem # 3:  CORONARY ARTERY DISEASE (ICD-414.00)  Continue aspirin, beta blocker and statin. Her updated medication list for this problem includes:    Nitrostat 0.4 Mg Subl (Nitroglycerin) .[name]... Prn    Amlodipine Besylate 5 Mg Tabs (Amlodipine besylate) .[name]... Take one tab daily    Labetalol Hcl 200 Mg Tabs (Labetalol hcl) .[name]..[name] Two tab by mouth twice daily    Carvedilol 25 Mg Tabs (Carvedilol) .[name]... Take one tablet by mouth twice a Hilbert  Her updated medication list for this problem includes:  Nitrostat 0.4 Mg Subl (Nitroglycerin) .[name]... Prn    Amlodipine Besylate 5 Mg Tabs (Amlodipine besylate) .[name]... Take one tab daily    Labetalol Hcl 200 Mg Tabs (Labetalol hcl) .[name]..[name] Two tab by mouth twice daily    Carvedilol 25 Mg Tabs (Carvedilol) .[name]... Take one tablet by mouth twice a Shishido  Problem # 4:  CHEST PAIN (ICD-786.50) Symptoms atypical. Schedule Myoview for risk stratification. Her updated medication list for this problem includes:    Nitrostat 0.4 Mg Subl (Nitroglycerin) .[name]... Prn    Amlodipine Besylate 5 Mg Tabs (Amlodipine besylate) .[name]... Take one tab daily    Labetalol Hcl 200 Mg Tabs (Labetalol hcl) .[name]..[name] Two tab by mouth twice daily    Carvedilol 25 Mg Tabs (Carvedilol) .[name]... Take one  tablet by mouth twice a Stimpson  Her updated medication list for this problem includes:    Nitrostat 0.4 Mg Subl (Nitroglycerin) .[name]... Prn    Amlodipine Besylate 5 Mg Tabs (Amlodipine besylate) .[name]... Take one tab daily    Labetalol Hcl 200 Mg Tabs (Labetalol hcl) .[name]..[name] Two tab by mouth twice daily    Carvedilol 25 Mg Tabs (Carvedilol) .[name]... Take one tablet by mouth twice a Archila  Problem # 5:  CHRONIC PAIN SYNDROME (ICD-338.4)  Problem # 6:  ANXIETY (ICD-300.00)  Problem # 7:  GERD (ICD-530.81)  Her updated medication list for this problem includes:    Ranitidine Hcl 150 Mg Caps (Ranitidine hcl) .[name]... 1 po bid  Her updated medication list for this problem includes:    Ranitidine Hcl 150 Mg Caps (Ranitidine hcl) .[name]... 1 po bid  Problem # 8:  FIBROMYALGIA (ICD-729.1) Magement per primary care.  Other Orders: Nuclear Stress Test (Nuc Stress Test)  Patient Instructions: 1)  Your physician recommends that you schedule a follow-up appointment in: 6 MONTHS 2)  Your physician has recommended you make the following change in your medication: INCREASE LABETALOL 200MG  TAKE TWO TABLETS TWICE DAILY 3)  CARVEDALOL TAKE ONE HALF TABLET TWICE DAILY X 2 DAYS THEN DECREASE TO 1/4 TABLET TWICE DAILY  X 2 DAYS THEN STOP 4)  Your physician has requested that you have an exercise stress myoview.  For further information please visit [email or web address].  Please follow instruction sheet, as given. Prescriptions: LABETALOL HCL 200 MG TABS (LABETALOL HCL) two tab by mouth twice daily  #120 x 12   Entered by:   Fredia Beets, RN   Authorized by:   Colin Mulders, MD, [facility]   Signed by:   Fredia Beets, RN on 04/18/2009   Method used:   Electronically to        [facility] (retail)       [address]       [place], [place]  [address]       Ph: [phone]       Fax: [phone]   RxID:   [id number]

## 2010-04-28 NOTE — Miscellaneous (Signed)
Summary: Hospital Admission: 11/13/2009 - Chest pain  INTERNAL MEDICINE ADMISSION HISTORY AND PHYSICAL ATTENDING: DR. Nicole Kindred ROGERS AI - LIZ UPTON [id number] R3 - DR. Donnella Bi [id number] ADMISSION DATE: 11/13/2009  PCP: DR. Harlow Mares  CC: CHEST PAIN  HPI: 50 y/o AA woman with a Hx of CAD/MI and sp LAD PCI in 2006, presents with CP induced at rest and relieved with NTG and ASA;exertional SOB and leg swelling. Patient denies any cough, wheezing, fever, chills or diapheresis. Notes N/V x 3 days triggered by mouth intake;  Denies abdominal pain, diarrhea. Reprots constipation.Last BM on 11/12/2009 which was hard in consistency; no BRB or melena. CP is described as sharp, midsternal, lasts for several minutes, no radiation. CP is "similar to the pain that she had in 2006 with 1st MI.  ALLERGIES: CLONIDINE HCL IBUPROFEN LOVASTATIN (LOVASTATIN)  PAST MEDICAL HISTORY:  HYPERTENSION  HYPERLIPIDEMIA  CORONARY ARTERY DISEASE MYOCARDIAL INFARCTION  UPPER GASTROINTESTINAL HEMORRHAGE GI bleed 12/2007 due to NSAID DEPRESSION  GERD  MEDICATIONS: HYDROCHLOROTHIAZIDE 25 MG Take one tab daily NITROSTAT 0.4 MG SUBL (NITROGLYCERIN) prn PRAVACHOL 20 MG TABS Take one tablet daily. AMITRIPTYLINE HCL 100 MG TABS  Take 1 tablet by mouth once a Roell at bedtime CLONAZEPAM 1 MG  Take one tablet two times a Todt as needed for anxiety. LABETALOL HCL 200 MG TABS (LABETALOL HCL) Take 3 tablets by mouth twice daily ASPIR-LOW 81 MG TBEC take one tablet by mouth daily OMEPRAZOLE 20 MG CPDR take 1 tab two times a Conly 15 mins before meals. METOCLOPRAMIDE HCL 5 MG TABS before meals  and at bed time as needed BUPROPION HCL 150 MG XR12H one tablet two times a Binz.    SOCIAL HISTORY: Occupation: unempl. on disability female domestic partner 1 child Former Smoker No drugs Occasional ETOH   FAMILY HISTORY: Mom- COPD, HTN, mother with heart murmur Dad- unk Aunt - colon cancer   ROS: per HPI  VITALS: Height:       63 inches Weight:      169.6 pounds BMI:     30.15 Temp:     97.3 degrees F Pulse rate:   76 / minute BP sitting:   132 / 90  (right arm)  PHYSICAL EXAM: General:  alert and well-developed.   Head:  normocephalic and atraumatic.   Eyes:  vision grossly intact, pupils equal, pupils round, and pupils reactive to light.   Ears:  no external deformities.   Nose:  no external deformity.   Mouth:  good dentition and pharynx pink and moist.   Neck:  supple, full ROM, and no masses.   Lungs:  Normal resp effort, expands symmetrically. Lungs clear to auscultation, no crackles or wheezes. Heart:  Normal rate and regular rhythm. S1 and S2 without gallop, murmur, click, rub or other extra sounds. Abdomen:  soft, non-tender, and normal bowel sounds.   Msk:  5/5 strength throughout Pulses:  R and L carotid,radial,femoral,dorsalis pedis and posterior tibial pulses are full and equal bilaterally Extremities:  No clubbing, cyanosis, edema, or deformity noted with normal full range of motion of all joints.   Neurologic:  No cranial nerve deficits noted. Station and gait are normal. Plantar reflexes are down-going bilaterally. DTRs are symmetrical throughout. Sensory, motor and coordinative functions appear intact. Skin:  Intact without suspicious lesions or rashes Cervical Nodes:  No lymphadenopathy noted Psych:  normally interactive, depressed affect, and slightly anxious.   LABS: pending  ASSESSMENT AND PLAN: Problem # 1:  CHEST PAIN  Given Hx  of CAD, 2 previous MI, with symptoms occuring at rest this is certainly most worrisome for unstable angina.versus AMI. 12 lead EKG done in outpatient clinic did not show any ST segment elevations but we do not have any stat CE. Other etiologies for chest pain considered such as pericarditis (unlikely given no specific symptoms of pleuritic chest pain, no friction rub on exam, and no EKG findings of diffuse ST elevation, PR depression), aortic dissection (pt has  history of uncontrolled HTN and subsequent MI as a result, will check CXR and will check BP in both arms), PNA unlikely based on symptoms, vitals, and PE, pneumothorax also unlikely given bilater and equal breath sounds, in additon has low probability for PE so will check d-dimers. GI causes such as PUD or GERD possible, will  cont protonix per her home regimen. Musculosceletal etiology also considered but unlikely given symptoms and PE findings.   Will admit to telemetry, cycle CE with EKGs, will check UDS, CXR.  Order nitroglycerin subL, oxygen as required, morphine IV for pain, Aspirin  Problem #2: HTN Will increase Labetolol dose to 600 mg by mouth two times a Puccinelli for a better blockade. Cosnder additing ACEI. Continue with the rest of meds.  Problem #3: Depression  Cont home meds   Problem #4: DVT prophylaxis  lovenox

## 2010-04-28 NOTE — Progress Notes (Signed)
Summary: phone note/gp  Phone Note Other Incoming   Summary of Call: Pt.went to her appt. in December to [place]; they do  not aceept Medicare and pt. did not have the money to pay.  I talked to Katy Fitch today about other options.  She said she will call [place] today. Initial call taken by: Morrison Old RN,  March 31, 2009 10:00 AM

## 2010-04-28 NOTE — Miscellaneous (Signed)
Summary: HEALTH INFORMATION  HEALTH INFORMATION   Imported By: Garlan Fillers 07/24/2009 11:20:55  _____________________________________________________________________  External Attachment:    Type:   Image     Comment:   External Document

## 2010-04-28 NOTE — Progress Notes (Signed)
Summary: Soc. Work  Dealer placed by: Soc. Work Architectural technologist of Call: Check on patient to make sure she could get appmt at [facility].  Gave her the information again today and told her [place].  Robin Searing I would call again end of next week to check how she did.

## 2010-04-28 NOTE — Assessment & Plan Note (Signed)
Summary: [facility]   Vital Signs:  Patient profile:   50 year old female Height:      63 inches (160.02 cm) Weight:      172.3 pounds (78.32 kg) BMI:     30.63 O2 Sat:      99 % on Room air Temp:     97.3 degrees F (36.28 degrees C) oral BP sitting:   142 / 102  (right arm)  Vitals Entered By: Hilda Blades Ditzler RN (November 21, 2009 9:39 AM)  O2 Flow:  Room air Is Patient Diabetic? No Pain Assessment Patient in pain? no      Nutritional Status BMI of > 30 = obese Nutritional Status Detail appetite good  Have you ever been in a relationship where you felt threatened, hurt or afraid?denies   Does patient need assistance? Functional Status Self care Ambulation Normal Comments Mother with pt. HFU - no change with SOB. Tremors in hands. Problems sitting on toilet with knees. Needs pain med.   Primary Care Provider:  Epimenio Sarin MD   History of Present Illness: VS noted 1.Hospital follow up for CP. Please, refer to a Hospital D/C summary. CP is likely of non-cardiac nature given cardiac cath w/o any changes and EF of 55%. Patient denies any SOB or CP at present.  2. C/o multiple joint pain, 10/10 in intensity; worse in am;lasts for 1-2 hours at a time. Patient desribes pain as stiffness, no radiculopathy; no fever, no chills, no rashes and + FMHx of OA. Onset "several months ago." Used to be treated with Darvocet until it was taken off the market. Denies previous use of NSAIDs or Cox-2.  3. C/o increased appetite; denies weight loss; reports weight gain -unable to quantify. Denies polyuria, polydypsia, craving sweets. Diet is high in calories (mainly carbohydrates); no exercise.   Depression History:      The patient denies a depressed mood most of the [place] and a diminished interest in her usual daily activities.         Preventive Screening-Counseling & Management  Alcohol-Tobacco     Alcohol drinks/Burgener: 0     Smoking Status: quit     Year Quit:  2004  Caffeine-Diet-Exercise     Does Patient Exercise: no     Exercise Counseling: to improve exercise regimen  Problems Prior to Update: 1)  Weight Gain  (ICD-783.1) 2)  Weight Gain  (ICD-783.1) 3)  Unspecified Breast Screening  (ICD-V76.10) 4)  Fibromyalgia  (ICD-729.1) 5)  Chest Pain  (ICD-786.50) 6)  Hypertension  (ICD-401.9) 7)  Hyperlipidemia  (ICD-272.4) 8)  Coronary Artery Disease  (ICD-414.00) 9)  Myocardial Infarction  (ICD-410.90) 10)  Chronic Pain Syndrome  (ICD-338.4) 11)  Knee Pain, Right  (ICD-719.46) 12)  Myalgia  (ICD-729.1) 13)  Headache  (ICD-784.0) 14)  Shoulder Pain  (ICD-719.41) 15)  Sinusitis, Acute  (ICD-461.9) 16)  Upper Gastrointestinal Hemorrhage  (ICD-578.9) 17)  Low Back Pain  (ICD-724.2) 18)  Angina  (ICD-413.9) 19)  Dental Pain  (ICD-525.9) 20)  Depression  (ICD-311) 21)  Gerd  (ICD-530.81) 22)  Anxiety  (ICD-300.00)  Current Problems (verified): 1)  Effusion, Pleural  (ICD-511.9) 2)  Weight Gain  (ICD-783.1) 3)  Weight Gain  (ICD-783.1) 4)  Unspecified Breast Screening  (ICD-V76.10) 5)  Fibromyalgia  (ICD-729.1) 6)  Chest Pain  (ICD-786.50) 7)  Hypertension  (ICD-401.9) 8)  Hyperlipidemia  (ICD-272.4) 9)  Coronary Artery Disease  (ICD-414.00) 10)  Myocardial Infarction  (ICD-410.90) 11)  Chronic Pain Syndrome  (ICD-338.4) 12)  Knee Pain, Right  (ICD-719.46) 13)  Myalgia  (ICD-729.1) 14)  Headache  (ICD-784.0) 15)  Shoulder Pain  (ICD-719.41) 16)  Sinusitis, Acute  (ICD-461.9) 17)  Upper Gastrointestinal Hemorrhage  (ICD-578.9) 18)  Low Back Pain  (ICD-724.2) 19)  Angina  (ICD-413.9) 20)  Dental Pain  (ICD-525.9) 21)  Depression  (ICD-311) 22)  Gerd  (ICD-530.81) 23)  Anxiety  (ICD-300.00)  Medications Prior to Update: 1)  Hydrochlorothiazide 25 Mg Tabs (Hydrochlorothiazide) .... Take One Tab Daily 2)  Nitrostat 0.4 Mg Subl (Nitroglycerin) .... Prn 3)  Claritin-D 12 Hour 5-120 Mg Xr12h-Tab (Loratadine-Pseudoephedrine) .... Take  One Tablet Daily For Sinuses. 4)  Pravachol 20 Mg Tabs (Pravastatin Sodium) .... Take One Tablet Daily. 5)  Amitriptyline Hcl 100 Mg Tabs (Amitriptyline Hcl) .... Take 1 Tablet By Mouth Once A Aigner At Bedtime 6)  Clonazepam 1 Mg Tabs (Clonazepam) .... Take One Tablet Two Times A Messman As Needed For Anxiety. 7)  Labetalol Hcl 200 Mg Tabs (Labetalol Hcl) .... Take 3 Tablets By Mouth Twice Daily 8)  Aspir-Low 81 Mg Tbec (Aspirin) .... Take One Tablet By Mouth Daily 9)  Omeprazole 20 Mg Cpdr (Omeprazole) .... Take 1 Tab Two Times A Wold 15 Mins Before Meals. 10)  Metoclopramide Hcl 5 Mg Tabs (Metoclopramide Hcl) .... Before Meals  and At Bed Time As Needed 11)  Bupropion Hcl 150 Mg Xr12h-Tab (Bupropion Hcl) .... Take 1 Tablet By Mouth Two Times A Cabanilla  Current Medications (verified): 1)  Hydrochlorothiazide 25 Mg Tabs (Hydrochlorothiazide) .... Take One Tab Daily 2)  Nitrostat 0.4 Mg Subl (Nitroglycerin) .... Prn 3)  Claritin-D 12 Hour 5-120 Mg Xr12h-Tab (Loratadine-Pseudoephedrine) .... Take One Tablet Daily For Sinuses. 4)  Pravastatin Sodium 40 Mg Tabs (Pravastatin Sodium) .... Take 1 Tab By Mouth At Bedtime 5)  Amitriptyline Hcl 100 Mg Tabs (Amitriptyline Hcl) .... Take 1 Tablet By Mouth Once A Delmonaco At Bedtime 6)  Clonazepam 1 Mg Tabs (Clonazepam) .... Take One Tablet Two Times A Granade As Needed For Anxiety. 7)  Labetalol Hcl 200 Mg Tabs (Labetalol Hcl) .... Take 3 Tablets By Mouth Twice Daily 8)  Aspir-Low 81 Mg Tbec (Aspirin) .... Take One Tablet By Mouth Daily 9)  Omeprazole 20 Mg Cpdr (Omeprazole) .... Take 1 Tab Two Times A Brutus 15 Mins Before Meals. 10)  Metoclopramide Hcl 5 Mg Tabs (Metoclopramide Hcl) .... Before Meals  and At Bed Time As Needed 11)  Bupropion Hcl 150 Mg Xr12h-Tab (Bupropion Hcl) .... Take 1 Tablet By Mouth Two Times A Weaber 12)  Raised Toilet Seat  Misc (Misc. Devices) .... Use As Dirrected 13)  Gemfibrozil 600 Mg Tabs (Gemfibrozil) .... Take One Tablet Two Times A Alverson For  Cholesterol 14)  Tramadol Hcl 50 Mg Tabs (Tramadol Hcl) .... Take 1 Tablet By Mouth Four Times A Sargeant  Allergies: 1)  Clonidine Hcl 2)  Ibuprofen 3)  Lovastatin (Lovastatin)  Past History:  Past medical, surgical, family and social histories (including risk factors) reviewed for relevance to current acute and chronic problems.  Past Medical History: Reviewed history from 04/18/2009 and no changes required. Current Problems:  HYPERTENSION (ICD-401.9) HYPERLIPIDEMIA (ICD-272.4) CORONARY ARTERY DISEASE (ICD-414.00)s/p PCI 9/06  MYOCARDIAL INFARCTION (ICD-410.90) CHRONIC PAIN SYNDROME (ICD-338.4) UPPER GASTROINTESTINAL HEMORRHAGE (ICD-578.9)GI bleed 12/2007 due to NSAID DEPRESSION (ICD-311) GERD (ICD-530.81) ANXIETY (ICD-300.00) Fibromyalgia  Past Surgical History: Reviewed history from 09/06/2008 and no changes required. s/p L breast cyst removal 2002  Family History: Reviewed history from 04/18/2009 and no changes required. Mom-  COPD, HTN, mother with heart murmur Dad- unk Aunt - colon cancer  Social History: Reviewed history from 04/18/2009 and no changes required. Occupation: unempl. on disability female domestic partner 1 child Former Smoker No drugs Occasional ETOH Does Patient Exercise:  no  Review of Systems       The patient complains of weight gain and depression.  The patient denies anorexia, fever, weight loss, chest pain, syncope, dyspnea on exertion, peripheral edema, prolonged cough, and headaches.    Physical Exam  General:  alert and well-developed.  well-nourished and overweight-appearing.   Head:  no abnormalities observed.   Eyes:  vision grossly intact.   Nose:  no external deformity.   Mouth:  pharynx pink and moist.   Neck:  supple, full ROM, and no masses.   Chest Wall:  chest wall tenderness.   Lungs:  normal respiratory effort, no intercostal retractions, no accessory muscle use, normal breath sounds, and no wheezes.   Heart:  Normal  rate and regular rhythm. S1 and S2 normal without gallop, murmur, click, rub or other extra sounds. Abdomen:  soft, non-tender, and normal bowel sounds.   Msk:  normal ROM, no joint tenderness, no joint swelling, no joint warmth, no redness over joints, and no joint deformities.   Crepitus to R knee with extension noted. Pulses:  R and L carotid,radial,femoral,dorsalis pedis and posterior tibial pulses are full and equal bilaterally Extremities:  No clubbing, cyanosis, edema, or deformity noted with normal full range of motion of all joints.   Neurologic:  alert & oriented X3, cranial nerves II-XII intact, strength normal in all extremities, and gait normal.   Skin:  Intact without suspicious lesions or rashes Psych:  Oriented X3, memory intact for recent and remote, withdrawn, poor eye contact, and poor concentration.     Impression & Recommendations:  Problem # 1:  EFFUSION, PLEURAL (ICD-511.9) Assessment New Found during hospitalization. Given EF of 55% per Cardiac cath --will not proceed with ECHO per D/C summary recs. Will check BNP; consider to repeat CXR in 4 weeks. Patient is asymptomatic and without signs of hypervolemia with O2 sats at 98%. If pleural effusion persists --will wokr up as indicated Orders: T-BNP  (B Natriuretic Peptide) [id number])  Problem # 2:  CORONARY ARTERY DISEASE (ICD-414.00) Assessment: Unchanged Will add Gemfibrozil to her regimen (TG of >400 per E-chart review). Low fat and high fiber diet advised. Handouts on nutrition and 1200 cal diet given. Encouraged to exrcise daily as tolerated. Her updated medication list for this problem includes:    Hydrochlorothiazide 25 Mg Tabs (Hydrochlorothiazide) .[name]... Take one tab daily    Nitrostat 0.4 Mg Subl (Nitroglycerin) .[name]... Prn    Labetalol Hcl 200 Mg Tabs (Labetalol hcl) .[name]... Take 3 tablets by mouth twice daily    Aspir-low 81 Mg Tbec (Aspirin) .[name]... Take one tablet by mouth daily  Labs Reviewed: Chol: 161  (05/16/2009)   HDL: 75 (05/16/2009)   LDL: 65 (05/16/2009)   TG: 105 (05/16/2009)  Problem # 3:  CHRONIC PAIN SYNDROME (ICD-338.4) Assessment: Unchanged Patient's Sx include mylagias and arthralgias --unclear etiology:fibromyalgia vs arthritis vs psychomotor? Given Hx of CAD-->willnot Rx NSAIds or Cox2 ; will start on Tramadol. Patient is to folllow up with a PT. Patient denies any worsening in her depression: no SI/HI or mania. Declined a referral for Mental health center consult. Instructed to call 911 or go to ED if feels worse. Orders: T-Sed Rate (Automated) [phone])  Problem # 4:  HYPERTENSION (ICD-401.9) Patient did  not take her meds this am. Strongly adivsed to adhere with a Tx regimen. Weight managment and low-salt diet addressed. Will recheck in 2 weeks. Her updated medication list for this problem includes:    Hydrochlorothiazide 25 Mg Tabs (Hydrochlorothiazide) .[name]... Take one tab daily    Labetalol Hcl 200 Mg Tabs (Labetalol hcl) .[name]... Take 3 tablets by mouth twice daily  BP today: 142/102 Prior BP: 132/90 (11/13/2009)  Prior 10 Yr Risk Heart Disease: N/A (09/06/2008)  Labs Reviewed: K+: 4.3 (05/16/2009) Creat: : 0.93 (05/16/2009)   Chol: 161 (05/16/2009)   HDL: 75 (05/16/2009)   LDL: 65 (05/16/2009)   TG: 105 (05/16/2009)  Complete Medication List: 1)  Hydrochlorothiazide 25 Mg Tabs (Hydrochlorothiazide) .... Take one tab daily 2)  Nitrostat 0.4 Mg Subl (Nitroglycerin) .... Prn 3)  Claritin-d 12 Hour 5-120 Mg Xr12h-tab (Loratadine-pseudoephedrine) .... Take one tablet daily for sinuses. 4)  Pravastatin Sodium 40 Mg Tabs (Pravastatin sodium) .... Take 1 tab by mouth at bedtime 5)  Amitriptyline Hcl 100 Mg Tabs (Amitriptyline hcl) .... Take 1 tablet by mouth once a Bartnik at bedtime 6)  Clonazepam 1 Mg Tabs (Clonazepam) .... Take one tablet two times a Lute as needed for anxiety. 7)  Labetalol Hcl 200 Mg Tabs (Labetalol hcl) .... Take 3 tablets by mouth twice daily 8)   Aspir-low 81 Mg Tbec (Aspirin) .... Take one tablet by mouth daily 9)  Omeprazole 20 Mg Cpdr (Omeprazole) .... Take 1 tab two times a Staheli 15 mins before meals. 10)  Metoclopramide Hcl 5 Mg Tabs (Metoclopramide hcl) .... Before meals  and at bed time as needed 11)  Bupropion Hcl 150 Mg Xr12h-tab (Bupropion hcl) .... Take 1 tablet by mouth two times a Sonnenberg 12)  Raised Toilet Seat Misc (Misc. devices) .... Use as dirrected 13)  Gemfibrozil 600 Mg Tabs (Gemfibrozil) .... Take one tablet two times a Halabi for cholesterol 14)  Tramadol Hcl 50 Mg Tabs (Tramadol hcl) .... Take 1 tablet by mouth four times a Borchardt  Other Orders: T- Hemoglobin A1C [id number])  Patient Instructions: 1)  Please, pick up your prescriptions at the pharmacy. 2)  Call if you are not feeling better. 3)  Please, follow up with a physical therapy. 4)  Call with any questions, Prescriptions: TRAMADOL HCL 50 MG TABS (TRAMADOL HCL) Take 1 tablet by mouth four times a Hughett  #120 x 6   Entered and Authorized by:   Milana Obey MD   Signed by:   Milana Obey MD on 11/21/2009   Method used:   Electronically to        [facility] ([facility]       [address]       [place], [place]  [address]       Ph: [phone]       Fax: [phone]   RxID:   [id number] PRAVASTATIN SODIUM 40 MG TABS (PRAVASTATIN SODIUM) Take 1 tab by mouth at bedtime  #30 x 11   Entered and Authorized by:   Milana Obey MD   Signed by:   Milana Obey MD on 11/21/2009   Method used:   Electronically to        [facility]       [address]       [place], [place]  [address]       Ph: [phone]  Fax: [phone]   [id number] NITROSTAT 0.4 MG SUBL (NITROGLYCERIN) prn  #25 x 6   Entered and Authorized by:   Milana Obey MD   Signed by:   Milana Obey MD on 11/21/2009   Method used:   Electronically to        [facility]       [address]       [place], [place]  [address]       Ph: [phone]       Fax: [phone]   RxID:   [id number] [place] ([place]. DEVICES) Use as dirrected  #1 x 0   Entered and Authorized by:   Milana Obey MD   Signed by:   Milana Obey MD on 11/21/2009   Method used:   Electronically to        [facility] ([facility])       [address]       [place], [place]  [address]       Ph: [phone]       Fax: [phone]   [place]:   [phone]  Process Orders Check Orders Results:     [facility]: Order checked:     [id number] -- T-BNP  (B Natriuretic Peptide) -- ABN required due to diagnosis (CPT: [id number])     [id number] -- T- Hemoglobin A1C -- ABN required due to diagnosis (CPT: [id number]) Tests Sent for requisitioning (November 21, 2009 12:29 PM):     11/21/2009: [facility] -- T- Hemoglobin A1C [[id number]] (signed)     11/21/2009: [facility] -- T-Sed Rate (Automated) [id number] (signed)     11/21/2009: [facility] -- T-BNP  (B Natriuretic Peptide) [phone] (signed)     Prevention & Chronic Care Immunizations   Influenza vaccine: Fluvax 3+  (01/12/2008)   Influenza vaccine deferral: Deferred  (10/02/2009)    Tetanus booster: Not documented   Td booster deferral: Deferred  (10/02/2009)    Pneumococcal vaccine: Not documented  Other Screening   Pap smear: Not documented   Pap smear action/deferral: Deferred  (10/02/2009)    Mammogram: BI-RADS CATEGORY 3:  Probably benign finding(s) - short interval^MM DIGITAL DIAGNOSTIC BILAT  (08/28/2009)   Smoking status: quit  (11/21/2009)  Lipids   Total Cholesterol: 161  (05/16/2009)   Lipid panel action/deferral: Lipid Panel ordered   LDL: 65  (05/16/2009)   LDL Direct: 125.0  (06/21/2008)   HDL: 75  (05/16/2009)   Triglycerides: 105  (05/16/2009)    SGOT  (AST): 13  (05/16/2009)   BMP action: Ordered   SGPT (ALT): <8 U/L  (05/16/2009)   Alkaline phosphatase: 45  (05/16/2009)   Total bilirubin: 0.4  (05/16/2009)  Hypertension   Last Blood Pressure: 142 / 102  (11/21/2009)   Serum creatinine: 0.93  (05/16/2009)   BMP action: Ordered   Serum potassium 4.3  (05/16/2009)  Self-Management Support :   Personal Goals (by the next clinic visit) :      Personal blood pressure goal: 140/90  (10/02/2009)     Personal LDL goal: 100  (10/02/2009)    Patient will work on the following items until the next clinic visit to reach self-care goals:     Medications and monitoring: take my medicines every Fly[place], bring all of my medications to every visit  (11/21/2009)     Eating:  drink diet soda or water instead of juice or soda, eat more vegetables, use fresh or frozen vegetables, eat foods that are low in salt, eat fruit for snacks and desserts, limit or avoid alcohol  (11/21/2009)     Activity: park at the far end of the parking lot  (11/21/2009)    Hypertension self-management support: Written self-care plan, Education handout, Resources for patients handout  (11/21/2009)   Hypertension self-care plan printed.   Hypertension education handout printed    Lipid self-management support: Written self-care plan, Education handout, Resources for patients handout  (11/21/2009)   Lipid self-care plan printed.   Lipid education handout printed      Resource handout printed.  Laboratory Results   Blood Tests   Date/Time Received: November 21, 2009 10:53 AM. Date/Time Reported: Maryan Rued  November 21, 2009 10:53 AM    HGBA1C: 4.8%   (Normal Range: Non-Diabetic - 3-6%   Control Diabetic - 6-8%)

## 2010-04-30 NOTE — Assessment & Plan Note (Signed)
Summary: rov 1 month///kp   Primary Provider/Referring Provider:  Rowe Clack MD  CC:  1 MONTH FOLLOW UP VISIT-still having headaches and nose stays cold.[name]  History of Present Illness: History of Present Illness: March 10, 2010- 49 yoF, daughter of my patient, asks assessment for allergy. Complains of life-long nasal congestion. In recent years notes this congestion is associated with cold tip of nose in cold weather. Right retroorbital pressure headaches. Hx of sinusitis but no sinus surgery. With sneeze wil sometimes blow a little blood from right nostril. All discomfort relates to dryness and winter weather, not to seasonal pollens, dust, cats or etc. Not postional, never purulent. Mucus is usually clear. Claritin D and saline rinse have not helped. Snores loudly. Denies cocaine or Afrin. Takes a lot of BC's and we discussed analgesic rebound headache as well as effect on bleeding.   April 13, 2010- Rhinitis, rhinosinusitis, headache.................Marland Kitchenmother here Nurse-CC: 1 MONTH FOLLOW UP VISIT-still having headaches and nose stays cold. CT sinus- WNL 03/12/10 Says her pattern is unchanged, dryness makes her worse. Daily headache. Complains of frontal, maxillary and retroorbital pain bilaterally. Sense of smell is ok. Blowing out some clear mucus. Ears ok. Denies drainage into throat or problems with her chest. Nose feels cold to her touch. Didn't try Neti pot.  Says nose itch, sneeze. Doesn't recognize specific triggers. Sometimes a little blood with sneeze.         Preventive Screening-Counseling & Management  Alcohol-Tobacco     Alcohol drinks/Winegar: 0     Alcohol Counseling: not indicated; patient does not drink     Smoking Status: quit     Packs/Brunette: 1.0     Year Quit: 1990's     Tobacco Counseling: to remain off tobacco products  Current Medications (verified): 1)  Hydrochlorothiazide 25 Mg Tabs (Hydrochlorothiazide) .... Take One Tab Daily 2)  Nitrostat 0.4 Mg  Subl (Nitroglycerin) .... Prn 3)  Claritin-D 12 Hour 5-120 Mg Xr12h-Tab (Loratadine-Pseudoephedrine) .... Take One Tablet Daily For Sinuses. 4)  Pravastatin Sodium 40 Mg Tabs (Pravastatin Sodium) .... Take 1 Tab By Mouth At Bedtime 5)  Amitriptyline Hcl 100 Mg Tabs (Amitriptyline Hcl) .... Take 1 Tablet By Mouth Once A Masella At Bedtime 6)  Clonazepam 1 Mg Tabs (Clonazepam) .... Take One Tablet Two Times A Callander As Needed For Anxiety. 7)  Labetalol Hcl 200 Mg Tabs (Labetalol Hcl) .... Take 2 Tablets By Mouth Twice Daily 8)  Aspir-Low 81 Mg Tbec (Aspirin) .... Take One Tablet By Mouth Daily 9)  Omeprazole 20 Mg Cpdr (Omeprazole) .... Take 1 Tab Two Times A Kohli 15 Mins Before Meals. 10)  Metoclopramide Hcl 5 Mg Tabs (Metoclopramide Hcl) .... Before Meals  and At Bed Time As Needed 11)  Vitamin D3 1000 Unit Tabs (Cholecalciferol) .... Take 1 By Mouth Once Daily 12)  Ranitidine Hcl 150 Mg Caps (Ranitidine Hcl) .... Take 1 Two Times A Rios 13)  Celexa 40 Mg Tabs (Citalopram Hydrobromide) .... Take 1 By Mouth Once Daily 14)  Mega Red 300mg  (Ovc) .... Take 1 By Mouth Once Daily 15)  Gemfibrozil 600 Mg Tabs (Gemfibrozil) .[name].. 1 By Mouth Two Times A Vanover For Cholesterol 16)  Nasonex 50 Mcg/act Susp (Mometasone Furoate) .... 2 Puffs Each Nostril Once Daily At Bedtime  Allergies (verified): 1)  Clonidine Hcl 2)  Ibuprofen 3)  Lovastatin (Lovastatin)  Past History:  Past Medical History: Last updated: 03/10/2010 HYPERTENSION  HYPERLIPIDEMIA CORONARY ARTERY DISEASE s/p PCI 11/2004 due to ant MI CHRONIC  PAIN SYNDROME UPPER GASTROINTESTINAL HEMORRHAGE- GI bleed 12/2007 due to NSAID DEPRESSION  GERD ANXIETY Fibromyalgia rhinitis  Md roster: cards - [facility] - [facility] - susan bond  Past Surgical History: Last updated: 01/20/2010 s/p L breast cyst removal 2002  Family History: Last updated: 04/18/2009 Mom- COPD, HTN, mother with heart murmur Dad- unk Aunt - colon  cancer  Social History: Last updated: 01/20/2010 Occupation: unempl. on disability lives alone, divorced prev lived with female domestic partner  1 child  Former Smoker No drugs Occasional ETOH  Risk Factors: Alcohol Use: 0 (04/13/2010) Exercise: no (01/20/2010)  Risk Factors: Smoking Status: quit (04/13/2010) Packs/Moshier: 1.0 (04/13/2010)  Review of Systems      See HPI       The patient complains of headaches, nasal congestion/difficulty breathing through nose, sneezing, and depression.  The patient denies shortness of breath with activity, shortness of breath at rest, productive cough, non-productive cough, coughing up blood, chest pain, irregular heartbeats, acid heartburn, indigestion, loss of appetite, weight change, abdominal pain, difficulty swallowing, sore throat, tooth/dental problems, hand/feet swelling, rash, change in color of mucus, and fever.    Vital Signs:  Patient profile:   50 year old female Height:      63 inches Weight:      188.13 pounds BMI:     33.45 O2 Sat:      98 % on Room air Pulse rate:   90 / minute BP sitting:   124 / 68  (left arm) Cuff size:   regular  Vitals Entered By: Clayborne Dana CMA (April 13, 2010 4:36 PM)  O2 Flow:  Room air CC: 1 MONTH FOLLOW UP VISIT-still having headaches and nose stays cold.   Physical Exam  Additional Exam:  General: A/Ox3;  cooperative, NAD, stressed appearing, flat affect again noted Skin- no rash NODES: no lymphadenopathy HEENT: [place]/AT, EOM- WNL, Conjuctivae- clear, PERRLA, TM-WNL, Nose- clear, Throat- clear and wnl, Mallampati  III, no drainage or polyps NECK: Supple w/ fair ROM, JVD- none, normal carotid impulses w/o bruits Thyroid- normal to palpation CHEST: Clear to P&A HEART: RRR, no m/g/r heard ABDOMEN: Soft and nl;  [id number], nl pulses, no edema  NEURO: Grossly intact to observation       Impression & Recommendations:  Problem # 1:  HEADACHE (ICD-784.0)  Have to suspect combination  of depression and analgesic rebound headache. These have been discussed.  She came with concern of allergy. To close that issue I will send in vitro IgE profile.   Her updated medication list for this problem includes:    Labetalol Hcl 200 Mg Tabs (Labetalol hcl) .[name]... Take 2 tablets by mouth twice daily    Aspir-low 81 Mg Tbec (Aspirin) .[name]... Take one tablet by mouth daily  Problem # 2:  CHRONIC RHINITIS (ICD-472.0)  Consideration as above.  I will have her try Neti pot and saline nasal el as remoisturizing options.   Other Orders: Est. Patient Level III [id number]) T-Allergy Profile Region [place], [place], [place], [place], [place] [phone]) TLB-Sedimentation Rate (ESR) (85652-ESR)  Patient Instructions: 1)  Please schedule a follow-up appointment in 1 month. 2)  I will suggest Dr Asa Lente might consider sending you to a headache clinic. 3)  Lab 4)  Try the Netipot/ saline rinse approach as discussed, to relieve over drying in your nasal passages 5)  Try otc nasal saline gel- AYR and others- to soothe dry nose.

## 2010-04-30 NOTE — Progress Notes (Signed)
Summary: pt in lobby  Phone Note Call from Patient   Caller: Patient Call For: [name] Reason for Call: Talk to Nurse, Lab or Test Results Summary of Call: Patient in lobby asking to speak to nurse about CT results.  Patient does not have phone. Initial call taken by: Mateo Flow,  March 25, 2010 9:28 AM  Follow-up for Phone Call        Pt aware CT sinus is normal per CDY. She says she has started itching all over her body and wants to know what she can take for this. She has changed laundry detergent and soap recently. She denies any rash. Pt is in the lobby. Pls advise.[name] [facility]  March 25, 2010 9:36 AM Allergies (verified):  1)  Clonidine Hcl 2)  Ibuprofen 3)  Lovastatin (Lovastatin)  Additional Follow-up for Phone Call Additional follow up Details #1::        I spoke with patient-she is aware per CDY to take OTC loratadine, fexofenadine, or Benadryl. Pt verbalized she had Benadryl at home and would use that.Clayborne Dana CMA  March 25, 2010 10:57 AM    Pt did not have any signs of hives when I spoke with her.Clayborne Dana CMA  March 25, 2010 10:57 AM

## 2010-04-30 NOTE — Progress Notes (Signed)
Summary: metoclopramide  Phone Note Refill Request Message from:  Fax from Pharmacy on April 02, 2010 11:42 AM  Refills Requested: Medication #1:  METOCLOPRAMIDE HCL 5 MG TABS before meals  and at bed time as needed  Method Requested: Electronic Initial call taken by: Tomma Lightning RMA,  April 02, 2010 11:43 AM    Prescriptions: METOCLOPRAMIDE HCL 5 MG TABS (METOCLOPRAMIDE HCL) before meals  and at bed time as needed  #60 Tablet x 1   Entered by:   Tomma Lightning RMA   Authorized by:   Rowe Clack MD   Signed by:   Tomma Lightning RMA on 04/02/2010   Method used:   Electronically to        [facility]       [address]       [place], [place]  [address]       Ph: [phone]       Fax: [phone]   [place]:   [phone]

## 2010-04-30 NOTE — Assessment & Plan Note (Signed)
Summary: allergy consult/self referral/mhh   Vital Signs:  Patient profile:   50 year old female Height:      63 inches Weight:      185.13 pounds BMI:     32.91 O2 Sat:      99 % on Room air Pulse rate:   101 / minute BP sitting:   108 / 64  (left arm) Cuff size:   regular  Vitals Entered By: Clayborne Dana CMA (March 10, 2010 3:25 PM)  O2 Flow:  Room air   Primary Provider/Referring Provider:  Rowe Clack MD   History of Present Illness: March 10, 2010- 49 yoF, daughter of my patient, asks assessment for allergy. Complains of life-long nasal congestion. In recent years notes this congestion is associated with cold tip of nose in cold weather. Right retroorbital pressure headaches. Hx of sinusitis but no sinus surgery. With sneeze wil sometimes blow a little blood from right nostril. All discomfort relates to dryness and winter weather, not to seasonal pollens, dust, cats or etc. Not postional, never purulent. Mucus is usually clear. Claritin D and saline rinse have not helped. Snores loudly. Denies cocaine or Afrin. Takes a lot of BC's and we discussed analgesic rebound headache as well as effect on bleeding.   Preventive Screening-Counseling & Management  Alcohol-Tobacco     Alcohol drinks/Imran: 0     Alcohol Counseling: not indicated; patient does not drink     Smoking Status: quit     Packs/Hilario: 1.0     Year Quit: 1990's     Tobacco Counseling: to remain off tobacco products  Comments: Smoked about 3 years  Current Medications (verified): 1)  Hydrochlorothiazide 25 Mg Tabs (Hydrochlorothiazide) .... Take One Tab Daily 2)  Nitrostat 0.4 Mg Subl (Nitroglycerin) .... Prn 3)  Claritin-D 12 Hour 5-120 Mg Xr12h-Tab (Loratadine-Pseudoephedrine) .... Take One Tablet Daily For Sinuses. 4)  Pravastatin Sodium 40 Mg Tabs (Pravastatin Sodium) .... Take 1 Tab By Mouth At Bedtime 5)  Amitriptyline Hcl 100 Mg Tabs (Amitriptyline Hcl) .... Take 1 Tablet By Mouth Once A Moynahan  At Bedtime 6)  Clonazepam 1 Mg Tabs (Clonazepam) .... Take One Tablet Two Times A Cambridge As Needed For Anxiety. 7)  Labetalol Hcl 200 Mg Tabs (Labetalol Hcl) .... Take 2 Tablets By Mouth Twice Daily 8)  Aspir-Low 81 Mg Tbec (Aspirin) .... Take One Tablet By Mouth Daily 9)  Omeprazole 20 Mg Cpdr (Omeprazole) .... Take 1 Tab Two Times A Braniff 15 Mins Before Meals. 10)  Metoclopramide Hcl 5 Mg Tabs (Metoclopramide Hcl) .... Before Meals  and At Bed Time As Needed 11)  Vitamin D3 1000 Unit Tabs (Cholecalciferol) .... Take 1 By Mouth Once Daily 12)  Ranitidine Hcl 150 Mg Caps (Ranitidine Hcl) .... Take 1 Two Times A Coulson 13)  Celexa 40 Mg Tabs (Citalopram Hydrobromide) .... Take 1 By Mouth Once Daily 14)  Mega Red 300mg  (Ovc) .... Take 1 By Mouth Once Daily 15)  Gemfibrozil 600 Mg Tabs (Gemfibrozil) .[name].. 1 By Mouth Two Times A Bhattacharyya For Cholesterol  Allergies (verified): 1)  Clonidine Hcl 2)  Ibuprofen 3)  Lovastatin (Lovastatin)  Past History:  Past Surgical History: Last updated: 01/20/2010 s/p L breast cyst removal 2002  Family History: Last updated: 04/18/2009 Mom- COPD, HTN, mother with heart murmur Dad- unk Aunt - colon cancer  Social History: Last updated: 01/20/2010 Occupation: unempl. on disability lives alone, divorced prev lived with female domestic partner  1 child  Former Smoker No  drugs Occasional ETOH  Risk Factors: Alcohol Use: 0 (03/10/2010) Exercise: no (01/20/2010)  Risk Factors: Smoking Status: quit (03/10/2010) Packs/Lantigua: 1.0 (03/10/2010)  Past Medical History: HYPERTENSION  HYPERLIPIDEMIA CORONARY ARTERY DISEASE s/p PCI 11/2004 due to ant MI CHRONIC PAIN SYNDROME UPPER GASTROINTESTINAL HEMORRHAGE- GI bleed 12/2007 due to NSAID DEPRESSION  GERD ANXIETY Fibromyalgia rhinitis  Md roster: cards - [facility] - [facility] - susan bond  Social History: Packs/Swenson:  1.0  Review of Systems       The patient complains of shortness of breath  with activity, shortness of breath at rest, non-productive cough, acid heartburn, indigestion, loss of appetite, weight change, headaches, nasal congestion/difficulty breathing through nose, sneezing, itching, anxiety, depression, and joint stiffness or pain.  The patient denies productive cough, coughing up blood, chest pain, irregular heartbeats, abdominal pain, difficulty swallowing, sore throat, tooth/dental problems, hand/feet swelling, rash, change in color of mucus, and fever.    Physical Exam  Additional Exam:  General: A/Ox3;  cooperative, NAD, stressed appearing, flat affect NODES: no lymphadenopathy HEENT: [place]/AT, EOM- WNL, Conjuctivae- clear, PERRLA, TM-WNL, Nose- clear, Throat- clear and wnl, Mallampati  II NECK: Supple w/ fair ROM, JVD- none, normal carotid impulses w/o bruits Thyroid- normal to palpation CHEST: Clear to P&A HEART: RRR, no m/g/r heard ABDOMEN: Soft and nl;  [id number], nl pulses, no edema  NEURO: Grossly intact to observation       Impression & Recommendations:  Problem # 1:  HEADACHE (ICD-784.0) Retroorbital headache. Probably not a sinus infection, but we will try Neti pot and get a limited sinus CT. Taper off analgesiscs- suspect analgesic rebound headache.  Her updated medication list for this problem includes:    Labetalol Hcl 200 Mg Tabs (Labetalol hcl) .[name]... Take 2 tablets by mouth twice daily    Aspir-low 81 Mg Tbec (Aspirin) .[name]... Take one tablet by mouth daily  Problem # 2:  CHRONIC RHINITIS (ICD-472.0) Try nasonex . We discussed allergic vs irritant/ vasomotor rhinitis and effects of dry winter heat.  Medications Added to Medication List This Visit: 1)  Nasonex 50 Mcg/act Susp (Mometasone furoate) .... 2 puffs each nostril once daily at bedtime  Other Orders: New Patient Level III [id number]) Nebulizer Tx [phone]) Radiology Referral (Radiology) Depo- Medrol 80mg  (J1040) Admin of Therapeutic Inj  intramuscular or subcutaneous [id number])  Patient  Instructions: 1)  Please schedule a follow-up appointment in 1 month. 2)  A Limited Sinus CT has been recommended.  Your imaging study may require preauthorization.  3)  Neb neo nasal  4)  depo 80 5)  Sample/ script Nasonex nasal steroid spray 6)    2 sprays each nostril once daily at bedtime 7)  Steady use of pain medicines can set up a rebound headache. The best way if you can is to try to get away from frequent daily use of  pain medicines.  8)  Try using a Neti pot for regular saline/ salt solution nasal rinse.  Prescriptions: NASONEX 50 MCG/ACT SUSP (MOMETASONE FUROATE) 2 puffs each nostril once daily at bedtime  #1 x prn   Entered and Authorized by:   Deneise Lever MD   Signed by:   Deneise Lever MD on 03/10/2010   Method used:   Print then Give to Patient   RxID:   [id number]    Medication Administration  Injection # 1:    Medication: Depo- Medrol 80mg     Diagnosis: CHRONIC RHINITIS (ICD-472.0)    Route: IM    Site: RUOQ  gluteus    Exp Date: 09/2012    Lot #: [id number]    Mfr: Pharmacia    Patient tolerated injection without complications    Given by: Charma Igo (March 10, 2010 4:32 PM)  Medication # 1:    Medication: EMR miscellaneous medications    Diagnosis: CHRONIC RHINITIS (ICD-472.0)    Dose: 3 drops    Route: intranasal    Exp Date: 06/2011    Lot #: [id number]    Mfr: Bayer    Comments: Neo-Synephrine    Patient tolerated medication without complications    Given by: Mindy Silva,CMA  Orders Added: 1)  New Patient Level III [id number] 2)  Nebulizer Tx [id number] 3)  Radiology Referral [Radiology] 4)  Depo- Medrol 80mg  [J1040] 5)  Admin of Therapeutic Inj  intramuscular or subcutaneous [id number]

## 2010-05-15 ENCOUNTER — Ambulatory Visit: Payer: Self-pay | Admitting: Internal Medicine

## 2010-06-02 ENCOUNTER — Telehealth (INDEPENDENT_AMBULATORY_CARE_PROVIDER_SITE_OTHER): Payer: Self-pay | Admitting: *Deleted

## 2010-06-08 ENCOUNTER — Ambulatory Visit: Payer: Self-pay | Admitting: Licensed Clinical Social Worker

## 2010-06-09 NOTE — Progress Notes (Signed)
Summary: returning call  Phone Note Call from Patient Call back at Home Phone [phone]   Caller: Patient Call For: young Summary of Call: Returning call. Initial call taken by: Netta Neat,  June 02, 2010 8:55 AM  Follow-up for Phone Call        Spoke with pt and she reports that sh did not call our office. Doroteo Glassman RN  June 02, 2010 11:22 AM

## 2010-06-11 LAB — CBC
HCT: 28.6 % — ABNORMAL LOW (ref 36.0–46.0)
HCT: 30.2 % — ABNORMAL LOW (ref 36.0–46.0)
Hemoglobin: 10.6 g/dL — ABNORMAL LOW (ref 12.0–15.0)
Hemoglobin: 9.6 g/dL — ABNORMAL LOW (ref 12.0–15.0)
MCH: 25.6 pg — ABNORMAL LOW (ref 26.0–34.0)
MCH: 27.2 pg (ref 26.0–34.0)
MCHC: 31.8 g/dL (ref 30.0–36.0)
MCHC: 32 g/dL (ref 30.0–36.0)
MCV: 83.7 fL (ref 78.0–100.0)
MCV: 84.9 fL (ref 78.0–100.0)
RBC: 3.61 MIL/uL — ABNORMAL LOW (ref 3.87–5.11)
RDW: 15.8 % — ABNORMAL HIGH (ref 11.5–15.5)
RDW: 15.5 % (ref 11.5–15.5)
WBC: 8.6 10*3/uL (ref 4.0–10.5)

## 2010-06-11 LAB — BASIC METABOLIC PANEL
BUN: 8 mg/dL (ref 6–23)
BUN: 9 mg/dL (ref 6–23)
CO2: 21 mEq/L (ref 19–32)
Calcium: 9 mg/dL (ref 8.4–10.5)
Calcium: 9.1 mg/dL (ref 8.4–10.5)
Chloride: 105 mEq/L (ref 96–112)
Creatinine, Ser: 1 mg/dL (ref 0.4–1.2)
Creatinine, Ser: 1.13 mg/dL (ref 0.4–1.2)
GFR calc Af Amer: >60 mL/min (ref >60)
GFR calc non Af Amer: 52 mL/min — ABNORMAL LOW (ref >60)
Glucose, Bld: 108 mg/dL — ABNORMAL HIGH (ref 70–99)
Glucose, Bld: 122 mg/dL — ABNORMAL HIGH (ref 70–99)
Glucose, Bld: 94 mg/dL (ref 70–99)
Potassium: 3.6 mEq/L (ref 3.5–5.1)
Potassium: 3.9 mEq/L (ref 3.5–5.1)
Sodium: 132 mEq/L — ABNORMAL LOW (ref 135–145)
Sodium: 136 mEq/L (ref 135–145)

## 2010-06-11 LAB — CARDIAC PANEL(CRET KIN+CKTOT+MB+TROPI)
CK, MB: 1.1 ng/mL (ref 0.3–4.0)
CK, MB: 1.2 ng/mL (ref 0.3–4.0)
Relative Index: 0.8 (ref 0.0–2.5)
Relative Index: 0.9 (ref 0.0–2.5)
Total CK: 123 U/L (ref 7–177)
Total CK: 138 U/L (ref 7–177)
Troponin I: 0.12 ng/mL — ABNORMAL HIGH (ref 0.00–0.06)

## 2010-06-11 LAB — COMPREHENSIVE METABOLIC PANEL
ALT: 38 U/L — ABNORMAL HIGH (ref 0–35)
AST: 21 U/L (ref 0–37)
Albumin: 2.9 g/dL — ABNORMAL LOW (ref 3.5–5.2)
BUN: 7 mg/dL (ref 6–23)
Calcium: 9.6 mg/dL (ref 8.4–10.5)
Calcium: 9 mg/dL (ref 8.4–10.5)
Chloride: 107 mEq/L (ref 96–112)
Creatinine, Ser: 1.26 mg/dL — ABNORMAL HIGH (ref 0.4–1.2)
Creatinine, Ser: 1.05 mg/dL (ref 0.4–1.2)
GFR calc Af Amer: >60 mL/min (ref >60)
Glucose, Bld: 119 mg/dL — ABNORMAL HIGH (ref 70–99)
Sodium: 136 mEq/L (ref 135–145)
Total Bilirubin: 0.3 mg/dL (ref 0.3–1.2)
Total Protein: 7.3 g/dL (ref 6.0–8.3)
Total Protein: 6 g/dL (ref 6.0–8.3)

## 2010-06-11 LAB — POCT CARDIAC MARKERS
CKMB, poc: <1 ng/mL — ABNORMAL LOW (ref 1.0–8.0)
CKMB, poc: <1 ng/mL — ABNORMAL LOW (ref 1.0–8.0)
Myoglobin, poc: 83.3 ng/mL (ref 12–200)
Troponin i, poc: <0.05 ng/mL (ref 0.00–0.09)
Troponin i, poc: <0.05 ng/mL (ref 0.00–0.09)

## 2010-06-11 LAB — LIPID PANEL
HDL: 47 mg/dL (ref >39)
LDL Cholesterol: UNDETERMINED mg/dL (ref 0–99)
Total CHOL/HDL Ratio: 4.3 RATIO
Triglycerides: 444 mg/dL — ABNORMAL HIGH (ref <150)
VLDL: UNDETERMINED mg/dL (ref 0–40)

## 2010-06-11 LAB — RAPID URINE DRUG SCREEN, HOSP PERFORMED
Barbiturates: NOT DETECTED
Cocaine: NOT DETECTED
Opiates: NOT DETECTED

## 2010-06-11 LAB — PROTIME-INR: INR: 1 (ref 0.00–1.49)

## 2010-06-11 LAB — D-DIMER, QUANTITATIVE: D-Dimer, Quant: <0.22 ug/mL-FEU (ref 0.00–0.48)

## 2010-06-11 LAB — TSH
TSH: 2.475 u[IU]/mL (ref 0.350–4.500)
TSH: 2.56 u[IU]/mL (ref 0.350–4.500)

## 2010-06-11 LAB — HEPARIN LEVEL (UNFRACTIONATED): Heparin Unfractionated: 0.8 IU/mL — ABNORMAL HIGH (ref 0.30–0.70)

## 2010-06-15 ENCOUNTER — Ambulatory Visit: Payer: Self-pay | Admitting: Internal Medicine

## 2010-07-06 ENCOUNTER — Other Ambulatory Visit: Payer: Self-pay | Admitting: *Deleted

## 2010-07-06 ENCOUNTER — Ambulatory Visit: Payer: Self-pay | Admitting: Licensed Clinical Social Worker

## 2010-07-06 LAB — COMPREHENSIVE METABOLIC PANEL
ALT: 17 U/L (ref 0–35)
AST: 24 U/L (ref 0–37)
Albumin: 4.1 g/dL (ref 3.5–5.2)
Alkaline Phosphatase: 37 U/L — ABNORMAL LOW (ref 39–117)
GFR calc Af Amer: >60 mL/min (ref >60)
Potassium: 3.2 mEq/L — ABNORMAL LOW (ref 3.5–5.1)
Sodium: 139 mEq/L (ref 135–145)
Total Protein: 7.4 g/dL (ref 6.0–8.3)

## 2010-07-06 LAB — TROPONIN I: Troponin I: 0.02 ng/mL (ref 0.00–0.06)

## 2010-07-06 LAB — CBC
Platelets: 421 10*3/uL — ABNORMAL HIGH (ref 150–400)
RDW: 12.8 % (ref 11.5–15.5)
WBC: 7.2 10*3/uL (ref 4.0–10.5)

## 2010-07-06 LAB — DIFFERENTIAL
Basophils Relative: 0 % (ref 0–1)
Eosinophils Absolute: 0.1 10*3/uL (ref 0.0–0.7)
Eosinophils Relative: 1 % (ref 0–5)
Monocytes Absolute: 0.7 10*3/uL (ref 0.1–1.0)
Monocytes Relative: 10 % (ref 3–12)
Neutro Abs: 4.4 10*3/uL (ref 1.7–7.7)

## 2010-07-06 LAB — URINE MICROSCOPIC-ADD ON

## 2010-07-06 LAB — CK TOTAL AND CKMB (NOT AT ARMC)
CK, MB: 0.7 ng/mL (ref 0.3–4.0)
Relative Index: 0.5 (ref 0.0–2.5)
Total CK: 142 U/L (ref 7–177)

## 2010-07-06 LAB — URINALYSIS, ROUTINE W REFLEX MICROSCOPIC
Bilirubin Urine: NEGATIVE
Glucose, UA: NEGATIVE mg/dL
Specific Gravity, Urine: 1.024 (ref 1.005–1.030)

## 2010-07-06 LAB — LIPASE, BLOOD: Lipase: 16 U/L (ref 11–59)

## 2010-07-06 MED ORDER — METOCLOPRAMIDE HCL 5 MG PO TABS: 5.0000 mg | ORAL_TABLET | Freq: Four times a day (QID) | ORAL | Status: DC

## 2010-08-11 NOTE — H&P (Signed)
Hayley Miller, Hayley Miller                  ACCOUNT NO.:  [id number]   MEDICAL RECORD NO.:  [id number]          PATIENT TYPE:  INP   LOCATION:  2923                         FACILITY:  [facility]   PHYSICIAN:  Sean A. Loanne Drilling, MD    DATE OF BIRTH:  [birth year]   DATE OF ADMISSION:  09/25/2007  DATE OF DISCHARGE:                              HISTORY & PHYSICAL   REASON FOR ADMISSION:  Hypertensive urgency.   HISTORY OF PRESENT ILLNESS:  This is a 50 year old woman patient of Dr.  Alain Marion.  She reports she has not been taking her blood pressure  medications recently, saying she cannot afford them.  She reports 1 or 2  days of slight diffuse headache, slight shortness of breath, and slight  non-vertiginous quality dizziness.  She was seen in the emergency room  and found to be severely hypertensive.   PAST MEDICAL HISTORY:  1. Anxiety and depression.  2. CAD.  3. GERD.  4. Dyslipidemia.  5. Hypertension.  6. She has not had a hysterectomy nor tubal ligation.  She reports      normal menses.   MEDICATIONS:  1. Zantac 150 mg twice a Cammarata.  2. Vasotec 20 mg twice a Reddinger.  3. Aspirin 81 mg a Derrig.  4. Vitamin D 1000 units daily.  5. Celexa 40 mg daily.  6. Mevacor 20 mg daily.  7. Coreg 25 mg twice a Adams.  8. Klonopin 0.5 mg twice a Esqueda as needed for anxiety or insomnia.   SOCIAL HISTORY:  She is a single mother of one.  She was laid off from  her job recently.  She has no health insurance and says she is unable to  pay for medical care.  She is a former smoker and does not drink  alcohol.   FAMILY HISTORY:  Mother has COPD.   REVIEW OF SYSTEMS:  She has a slight pain across the chest only with  movement of the arms.  She reports some slight blurry vision and a few  palpitations, but she denies the following; excessive diaphoresis,  worsening of her anxiety and depression.  She denies seizures, skin  rash, rectal bleeding, melena, hematuria, abdominal pain, nausea,  vomiting, fever, and  weight loss.   PHYSICAL EXAMINATION:  VITAL SIGNS:  Blood pressure is 174/108, heart  rate is 78, respiratory rate 20, and temperature is 98.4.  GENERAL:  A young woman resting comfortably in no distress.  HEENT:  No evidence of head trauma to external examination.  The sclerae  are nonicteric.  Pharynx is normal.  NECK:  Supple.  No goiter.  CHEST:  Clear to auscultation.  No respiratory distress.  CARDIOVASCULAR:  No edema.  Regular rate and rhythm.  No murmur.  Pedal  pulses are intact and there is no carotid bruit.  ABDOMEN:  Soft and nontender.  No hepatosplenomegaly.  No mass.  Breast,  gynecologic, and rectal examinations not done at this time due to her  condition.  EXTREMITIES:  No deformity is seen.  The feet and hands are slightly  cool to touch, which  the patient says is chronic.  NEUROLOGIC:  Alert and oriented.  Cranial nerves appear to be intact.  She readily moves all 4 and sensation is diffusely intact to touch.   LABORATORY STUDIES:  Electrocardiogram shows a question of anterior QS  complexes.  BMET, CBC, cardiac enzymes, and brain natriuretic peptide  are all normal.  Chest x-ray is clear.   IMPRESSION:  1. Hypertension.  2. History of noncompliance with medications, causing hypertensive      urgency.  3. Shortness of breath probably due to her severe hypertension.  4. Anxiety and depression, which are exacerbating her difficult social      situation.   PLAN:  1. Admit to telemetry.  2. Finish series of cardiac enzymes.  3. Check D-dimer.  4. Care management consult to see if this patient qualifies for      Medicaid.  5. Resume outpatient antihypertensive regimen.  6. As I know, she has received a dose of Normodyne here in the      emergency room and still has hypertension.  I will request a single      dose of Catapres by mouth.  I anticipate resumption of her oral      agents here in the hospital.  We will improve her blood pressure      control.  7. I  discussed code status with the patient.  She requests to defer      any situation for now.  8. We would check a urine pregnancy test.      Sean A. Loanne Drilling, MD  Electronically Signed     SAE/MEDQ  D:  09/25/2007  T:  09/26/2007  Job:  [id number]

## 2010-08-11 NOTE — Assessment & Plan Note (Signed)
Hayley Miller                                 ON-CALL NOTE   Hayley Miller, Hayley Miller                         MRN:          [id number]  DATE:01/03/2008                            DOB:          08/27/1960    CALLER:  Dionisio David.   TIME:  At 4:38 a.m.   PHONE NUMBER:  [phone]   REGULAR DOCTOR:  Evie Lacks. Plotnikov, MD   ON-CALL CHIEF COMPLAINT:  Diarrhea and vomiting.   The patient's caregiver states that she is having diarrhea and vomiting  with some fever and now abdominal pain which is mild.  It has been going  on for about a Venson.  Her blood pressure has been labile as low as 130/80  and as high as 140/100.  She is having some headaches and is very weak  and she wants to know what to do.  She is encouraging small sips of  fluids.  I told her that if she thinks her weakness is worsened or she  is having difficulty getting up without getting dizzy, then she needs to  go to the emergency room now for evaluation and possibly IV fluids.  If  this is not the case to wait and call the office in the morning for an  appointment to be seen.  She told me she is leaning towards to take her  to the emergency room since she is so weak and I advised her to do this.     Marne A. Tower, MD  Electronically Signed    MAT/MedQ  DD: 01/03/2008  DT: 01/03/2008  Job #: [id number]

## 2010-08-11 NOTE — Discharge Summary (Signed)
Hayley Miller, Hayley Miller                  ACCOUNT NO.:  [id number]   MEDICAL RECORD NO.:  [id number]          PATIENT TYPE:  INP   LOCATION:  [place]                         FACILITY:  [facility]   PHYSICIAN:  Heinz Knuckles. Norins, MD  DATE OF BIRTH:  [birth year]   DATE OF ADMISSION:  01/03/2008  DATE OF DISCHARGE:  01/07/2008                               DISCHARGE SUMMARY   ADMITTING DIAGNOSES:  1. Abdominal pain with nausea, vomiting and diarrhea.  2. Headache.  3. Chronic hypertension.  4. Dyslipidemia.  5. Anxiety/depression.  6. Near-syncope.   DISCHARGE DIAGNOSES:  1. Upper gastrointestinal bleed.  2. Gastroenteritis.  3. Headaches.  4. Anemia secondary to acute blood loss from the upper      gastrointestinal bleed.  5. Hypertension, labile.  6. Hypokalemia resolved.   CONSULTANTS:  Dr. Lucio Edward for GI.   PROCEDURES:  1. CT scan of the brain without contrast performed October 7 which was      negative for bleed or other acute intracranial abnormality.  2. Abdominal film performed January 03, 2008 which showed      nonobstructive bowel gas pattern.  3. EGD which revealed the patient to have gastritis and esophagitis      unspecified.   HISTORY OF PRESENT ILLNESS:  The patient is a 50 year old African  American woman followed by Dr. Walker Kehr for severe and labile  hypertension.  She presented to [facility] emergency department via EMS  because of abdominal pain, nausea, vomiting and diarrhea which began  about 3 a.m. on the Valent of admission.  There was a question of coffee-  ground emesis per EDP report.  The patient did feel lightheaded and near-  syncopal when she called EMS.  In the emergency department, she had  Hemoccult positive stool and emesis.  She was feeling weak and faint.  For these reasons she was admitted to the hospital.   Please see H&P for past medical history, family history, social history  and admission exam.   HOSPITAL COURSE:  1. Abdominal pain.   The patient had a negative KUB.  Her nausea and      vomiting resolved spontaneously.  Her abdominal pain also improved.      She also had gastroenteritis.  2. GI bleed.  The patient does use NSAIDs on a regular basis.  She was      heme-positive in the ER.  She did have progressive drop in      hemoglobin from 13.4, to 11 to 10.8 grams with a question of blood      dilution plus GI blood loss.  She was seen in consultation by the      GI service who took her for EGD with findings as noted.  The      patient was to take PPI therapy.  3. Headaches.  The patient has frequent headaches.  Question whether      this was due to rebound from aspirin or aspirin-type products.  A      CT of the brain was normal.  Plan, the patient will be  discharged      home with strict instructions to avoid aspirin and other aspirin-      like products.  She can use Darvocet for pain or headache.  4. Hypertension.  Patient has very labile hypertension.  This is a      longstanding problem.  She continued to have elevated blood      pressures in hospital.  Plan, the patient will resume all of her      home medications.  Will add labetalol 200 mg t.i.d. to her home      regimen.  5. Hypokalemia.  The patient was repleted with a stable potassium at      time of discharge.   DISCHARGE EXAMINATION:  VITAL SIGNS:  Temperature of 98.7, blood  pressure 156/105, heart rate 82, respirations 20, O2 sats 99% on room  air.  GENERAL APPEARANCE:  This is a pleasant young [place] American woman  with a flat affect.  She does follow commands and does seem to  understand her situation.  HEENT:  Exam was unremarkable.  CHEST:  Clear.  CARDIOVASCULAR:  She had a quiet precordium with a regular rate and  rhythm.  ABDOMEN:  Soft.  She had positive bowel sounds.  There is no guarding or  rebound.   FINAL LABORATORY:  Date of discharge, hemoglobin was 11.6 grams.  Chemistries with sodium 136, potassium 3.8, chloride 103, CO2  26, BUN 6,  creatinine 0.82, glucose was 92.   DISCHARGE MEDICATIONS:  1. Enalapril 20 mg b.i.d.  2. Nitroglycerin 0.4 mg sublingual p.r.n.  3. Coreg 25 mg b.i.d.  4. Citalopram 40 mg daily.  5. Lovastatin 20 mg daily.  6. Aspirin is discontinued.  7. Hydrochlorothiazide 12.5 mg daily.  8. Phenergan 25 mg p.o. p.r.n.  9. Will add labetalol 200 mg t.i.d. for blood pressure control.  10.Will add Nexium 40 mg daily for 4 weeks and then to be reassessed      by her primary care physician.   CONDITION AT TIME OF DISCHARGE DICTATION:  Stable and improved.      Heinz Knuckles Norins, MD  Electronically Signed     MEN/MEDQ  D:  01/07/2008  T:  01/08/2008  Job:  [id number]   cc:   Pricilla Riffle. Fuller Plan, MD, FACG  [address] [address]   Aleksei V. Plotnikov, MD  [address]  [place] [address]

## 2010-08-14 NOTE — Discharge Summary (Signed)
NAMEINDIANA, POZZI                  ACCOUNT NO.:  [id number]   MEDICAL RECORD NO.:  [id number]          PATIENT TYPE:  INP   LOCATION:  4708                         FACILITY:  [facility]   PHYSICIAN:  Valerie A. Asa Lente, MDDATE OF BIRTH:  03/13/1961   DATE OF ADMISSION:  09/25/2007  DATE OF DISCHARGE:  09/28/2007                               DISCHARGE SUMMARY   DISCHARGE DIAGNOSES:  1. Uncontrolled hypertension.  2. Constipation.  3. Anxiety/depression.  4. Dyslipidemia.   HISTORY OF PRESENT ILLNESS:  [name] is a 50 year old female who was  admitted on September 25, 2007, with chief complaint of hypertensive urgency.  She reports not taking her blood pressure medicines recently saying that  she could not afford them.  She reported 1- to 2-Resh history of slight  diffuse headache with shortness of breath and slight nonvertiginous  quality dizziness.  She was seen in the emergency room and found to be  severely hypertensive and was admitted for further evaluation and  treatment.   COURSE OF HOSPITALIZATION:  1. Uncontrolled hypertension.  The patient was admitted.  She was      placed back on her home medicine regimen.  Her blood pressure      stabilized and she was discharged to home.  2. Constipation.  The patient was given a Fleet Enema and MiraLax was      added to her regimen.   PERTINENT LABORATORIES:  At the time of discharge, cardiac enzymes  negative x3.  BUN 14, creatinine 1.0, hemoglobin 13.5, hematocrit 40.3.   MEDICATIONS AT TIME OF DISCHARGE:  1. Aspirin 81 mg p.o. daily.  2. Citalopram 40 mg p.o. daily.  3. Ranitidine 180 mg p.o. b.i.d.  4. NitroQuick 0.4 mg sublingual q.5 minutes x3 p.r.n. chest pain.  5. Phenergan 25 mg p.o. 4 times daily as needed.  6. Clonazepam 0.5 mg p.o. b.i.d.  7. Nexium 40 mg p.o. daily.  8. Lovastatin 20 mg p.o. daily.  9. Coreg 25 mg p.o. b.i.d.  10.Claritin 10 mg p.o. daily.  11.Vasotec 20 mg p.o. b.i.d.  12.Clonidine 0.1 mg p.o.  b.i.d.  13.MiraLax 17 g in 8 ounces of water once daily.   DISPOSITION:  She was discharged to home.   FOLLOW UP:  She was instructed to follow up with her primary care  Kele Barthelemy at [facility], Dr. Lew Dawes in 2 weeks, and contact the  office for an appointment.       Debbrah Alar, NP      Jannifer Rodney. Asa Lente, MD  Electronically Signed    MO/MEDQ  D:  10/20/2007  T:  10/21/2007  Job:  [id number]

## 2010-08-14 NOTE — H&P (Signed)
Hayley Miller, Hayley Miller                  ACCOUNT NO.:  [id number]   MEDICAL RECORD NO.:  [id number]          PATIENT TYPE:  INP   LOCATION:  2917                         FACILITY:  [facility]   PHYSICIAN:  Jeanella Craze. Little, M.D. DATE OF BIRTH:  Dec 02, 1960   DATE OF ADMISSION:  12/17/2004  DATE OF DISCHARGE:                                HISTORY & PHYSICAL   HISTORY OF THE PRESENT ILLNESS:  Hayley Miller is a 50 year old female who is  admitted through the emergency room with chest pain.  She has no prior  history of coronary disease,  she developed acute substernal chest pain at  about 7:15 this morning; it was rated as a 7/10.  In the emergency room she  had ST elevation in the inferolateral leads. The patient was recently on a  cruise, had to be taken off the ship and was hospitalized in the [place] for chest pain earlier this week.  No records are available.  She  signed out [place]  after one [place] on December 14, 2004 and came back to the  [place].   PAST MEDICAL HISTORY:  The patient's past medical history is remarkable for  hypertension.  She has had previous breast cysts.  There is no history of  diabetes or dyslipidemia.   MEDICATIONS:  The patient takes no medications.   ALLERGIES:  The patient has no known drug allergies.   SOCIAL HISTORY:  The patient is single.  She has a 16 year old child.  She  is a nonsmoker.   FAMILY HISTORY:  The patient's mother is alive with a history of diabetes.  There is no other family history of coronary disease.   REVIEW OF SYSTEMS:  He review of systems is essentially unremarkable, except  for that as noted above.   PHYSICAL EXAMINATION:  VITAL SIGNS:  Blood pressure 198/112, pulse 80 and  respirations 20.  GENERAL APPEARANCE:  In general she is a well-developed, thin, anxious  African-American female complaining of substernal chest pain .  HEENT:  Normocephalic.  Extraocular movements are intact.  Sclerae is  nonicteric.  NECK:  The  neck is without bruits and without JVD.  CHEST:  The chest is clear to auscultation and percussion.  HEART:  Cardiac exam revealed regular rate and rhythm without any obvious  murmur, rub or gallop.  Normal S1 and S2.  ABDOMEN:  The abdomen is nontender.  No hepatosplenomegaly.  EXTREMITIES:  The extremities are without edema and distal pulses are  intact.  NEUROLOGIC:  The neuro exam is grossly intact.  SKIN:  The skin is cool and dry,   IMPRESSION:  1.  Acute myocardial infarction.  2.  History of hypertension.  3.  Family history of diabetes.   PLAN:  The patient will be taken urgently to the cath lab for further  evaluation.      Erlene Quan, P.A.    ______________________________  Jeanella Craze. Little, M.D.    Meryl Dare  D:  12/17/2004  T:  12/18/2004  Job:  [id number]

## 2010-08-14 NOTE — Discharge Summary (Signed)
NAMEGIZELE, SPECIALE                  ACCOUNT NO.:  [id number]   MEDICAL RECORD NO.:  [id number]          PATIENT TYPE:  INP   LOCATION:  [id number]                         FACILITY:  [facility]   PHYSICIAN:  Quay Burow, M.D.   DATE OF BIRTH:  February 14, 1961   DATE OF ADMISSION:  01/07/2005  DATE OF DISCHARGE:  01/08/2005                                 DISCHARGE SUMMARY   ADDENDUM  In reviewing Ms. Arscott's records, it was decided not to send her home on  Plavix, as our plan was to let her LAD occlude. Will also obtain an event  monitor. Dr. Tami Ribas had wanted to get a follow-up echocardiogram in about 6  months to assess her LV function, and this will be arranged in the office.      Erlene Quan, P.A.      Quay Burow, M.D.  Electronically Signed    LKK/MEDQ  D:  01/08/2005  T:  01/09/2005  Job:  [id number]

## 2010-08-14 NOTE — Discharge Summary (Signed)
NAMEELITHA, FAUTEUX                  ACCOUNT NO.:  [id number]   MEDICAL RECORD NO.:  [id number]          PATIENT TYPE:  INP   LOCATION:  [id number]                         FACILITY:  [facility]   PHYSICIAN:  Octavia Heir, MD  DATE OF BIRTH:  1960-06-18   DATE OF ADMISSION:  12/17/2004  DATE OF DISCHARGE:  12/24/2004                                 DISCHARGE SUMMARY   [name] is a 50 year old who presented to the ER with chest pain.  She apparently was on a cruise to the [place].  She was taken off the  hip and hospitalized.  She was told she had an MI.  She signed out AMA,  returned to the [place], and apparently she awakened with some chest  pain on December 17, 2004, and came to the emergency room.  She was seen by  Dr. Rex Kras who found she had an inferolateral MI, acute changes, she was put  on IV nitroglycerin and heparin, and taken to the cath lab.  She was cathed  by Dr. Tami Ribas.  She had a totaled LAD, mid to distal area, it was a very  small vessel.  She underwent a PTCA to that vessel.  She went from TIMI 0  flow to TIMI II flow.  No stent was placed secondary to a 1.5 mm vessel.  He  decided to continue Integrilin through 24 hours.  The following St[place]er, she was  feeling OK.  Her medications were titrated.  She was seen by cardiac rehab  on December 19, 2004, and walked in the hall about 200 feet.  Then, on  December 20, 2004, she developed chest pain.  She was seen by Dr. Mathis Bud.  IV nitroglycerin was increased.  She was put back on IV Integrilin and she  was moved to the unit.  Dr. Claiborne Billings and Dr. Mathis Bud reviewed her films and  decided there was no intervention to be done because of the small size of  the vessel.  It was decided that she should be allowed to infarct that  vessel since it was such a small size.  Over the next several days, her  medications were titrated.  She continued to have chest pain on and off.  She was taken off her Plavix and her Zocor  secondary to leg pain.  On  December 24, 2004, her blood pressure was stable, 133/90, her pulse was 70,  respirations 20, temperature 98.4.  She was seen by Dr. Claiborne Billings.  Her echo was  read.  Her EF was 40-50% with moderate hypokinesis of the distal septal  anterior wall.  She had some mild MR.  Dr. Claiborne Billings felt she was stable to be  discharged home.  She was seen by cardiac rehab.  She will not be able to  attend secondary to no insurance coverage.   LABORATORY DATA:  Sodium 140, potassium 3.7, chloride 106, CO2 29, BUN 9,  creatinine 0.8.  Hemoglobin 10.1, hematocrit 29.8, WBC 9.5, and platelets  432.  AST 56, ALT 16.  CK MB number 1, 124/2.4, troponin 0.25, number  2,  610/75 with a troponin 10.67, number 3, 845/94, troponin 14.17, number 4,  778/65, troponin 12.85.  On December 21, 2004, repeat CK MBs were done  secondary to her chest pain, CK MB number 1, 127/4.5, troponin 6.52, number  2, 129/3.5, troponin 3.8, number 3, 85/2.5, troponin 4.08.  Total  cholesterol 127, HDL 37, triglycerides 101, LDL 70.  Chest x-ray showed  stable mild cardiomegaly, no evidence of acute disease.   DISCHARGE MEDICATIONS:  Aldactone 12.5 mg daily, aspirin 325 mg daily,  Metoprolol 50 mg b.i.d., Enalapril 10 mg b.i.d., Biodel 10/37.5 t.i.d.,  Lipitor 10 mg every other Jow[place].  She may take over the counter Pepcid.  Nitroglycerin 1/150 p.r.n. for chest pain.   She will follow up with Dr. Tami Ribas on October 6 at 3:15.   DISCHARGE DIAGNOSIS:  1.  Acute anterior myocardial infarction.  2.  Status post cardiac catheterization December 17, 2004, by Dr. Alla German, with single vessel disease, she had a total left anterior      descending.  He attempted a percutaneous transluminal coronary      angiography with reduction down to 50%.  She had from TIMI 0 to TIMI II      flow at the end of the procedure.  The vessel was a small size, thus, no      stent was placed.  She did have recurrent chest pain with  ST elevation      several days later and it was recommended that she be allowed to infarct      that vessel.  Thus, she was taken off her Plavix,      initially started on Integrilin with the onset of recurrent chest pain      which was discontinued.  3.  Ejection fraction of 40-50% by 2D echocardiogram done December 22, 2004.  4.  Hypertension.      [name], N.P.      Octavia Heir, MD  Electronically Signed    BB/MEDQ  D:  12/24/2004  T:  12/24/2004  Job:  [id number]

## 2010-08-14 NOTE — Cardiovascular Report (Signed)
Hayley Miller, Hayley Miller                  ACCOUNT NO.:  [id number]   MEDICAL RECORD NO.:  [id number]          PATIENT TYPE:  INP   LOCATION:  [id number]                         FACILITY:  [facility]   PHYSICIAN:  Octavia Heir, MD  DATE OF BIRTH:  1960-12-13   DATE OF PROCEDURE:  12/17/2004  DATE OF DISCHARGE:                              CARDIAC CATHETERIZATION   PROCEDURE:  1.  Left heart catheterization.  2.  Coronary angiograph.  3.  Left ventriculogram.  4.  Left anterior descending--apical.  5.  Percutaneous transluminal coronary angioplasty.   ATTENDING PHYSICIAN:  Octavia Heir, MD   COMPLICATIONS:  None.   INDICATIONS FOR PROCEDURE:  Hayley Miller is a 50 year old female with no prior  cardiac history who was on a cruise ship in the [place] when she  developed substernal chest pain approximately three to four days prior to  admission to [facility]. By her report she was taken off the cruise  ship and admitted to the hospital in the [place] where she was told that  she did have a myocardial infarction.  She ultimately left the [place] and  returned to [facility] on December 16, 2004. On the morning of December 17, 2004,  she awoke with substernal chest pain and presented to the ER at  [facility] where she was noted to have inferior and lateral ST-  segment elevation. She is now brought for acute cardiac catheterization,  rule out CAD, and possible PCI.   DESCRIPTION OF OPERATION:  After obtaining informed consent, the patient was  brought to the cardiac catheterization lab where the right and left groin  were shaved, prepped and draped in the usual sterile fashion. Anesthesia  monitoring established.  Lidocaine 1% was used to anesthetize the right  femoral area. Dr. Chase Picket then performed the diagnostic angiogram.  Using the modified Seldinger technique, a #6 femoral sheath was inserted in  the right femoral artery. A 6-French diagnostic catheter  was used to perform  diagnostic angiography.   The left main is a large short vessel with no significant disease.   The LAD is a medium to large size vessel which courses towards the apex and  is noted to be totally occluded in its apical portion. There are two  diagonal branches. The LAD is tortuous in its distal course. There are very  faint collaterals noted to the apical LAD. There is a 50% lesion in distal  mid portion of the LAD just prior to the total occlusion.   The first and second diagonals are medium size vessels with no significant  disease.   The left circumflex is a large size vessel, coursing the AV groove, and  gives rise to three obtuse marginal branches. The AV groove of circumflex  has no significant disease.   The first, second, and third OMs are medium size vessels with no significant  disease.   The right coronary artery is a large dominant vessel which cross the PDA in  its posterolateral branch. There is no significant disease in the RCA, PDA,  and  posterolateral branch.   The left ventriculogram reveals a moderately depressed EF of 35 % to 40%  with anteroapical and inferior apical akinesis.   HEMODYNAMIC SYSTEM:  Systemic arterial pressure 206/120, LV system pressure  280/14, LVEDP of 26.   INTERVENTIONAL PROCEDURE:  LAD--apical: Following diagnostic angiography a  #6-French JL4 guiding catheter with side holes __________ engaged in the  left coronary ostium. Next, a 0.014 Patriot guide wire was advanced through  a guiding catheter and positioned at the apical LAD. However, we were unable  to successfully cross the apical lesion. This guide wire was then exchanged  for a [employer] wire which was then able to cross into the apical  segment of the LAD. Next, a Voyager 1.5 x 15-mm balloon was then used to  cross the apical segment and four subsequent inflations to a maximum of 10  atmospheres were performed for a total of approximately 2 minutes and  42  seconds. Follow-up angiogram revealed restoration of TIMI-II flow into the  apical LAD with a dissection noted in the tortuous segment. It should be  noted that this was an approximately 1.5 to 2 mm vessel at the apex, an  approximately 15 mm length lesion. After multiple inflations it was  determined that greater than a 15-mm stent would be required at the apex and  relatively small vessel, and we elected ultimately just to perform  percutaneous balloon angioplasty alone. The patient did become pain free. IV  Integrilin was used throughout the case.  Intravenous doses of heparin was  given to maintain the ACT between 200 and 300.   Multiple doses of intracoronary nitroglycerin as well as 18 mcg of adenosine  were given in the LAD.   Final orthogonal angiogram revealed restoration of TIMI-II flow into the  apical LAD with a residual 50% to 60% lesion and linear dissection in the  apex. We ultimately elected to treat this medically. Our balloons, wires and  catheters were removed. ___________ and sheaths were sewn in place and the  patient was transferred to the recovery room in stable condition.   CONCLUSION:  1.  Successful percutaneous transluminal coronary angioplasty of the apical      left anterior descending with residual 50% stenosis and linear      dissection with TIMI-II flow in a long apical segment.  2.  Moderately depressed left ventricular systolic function with wall motion      abnormality as noted above.  3.  Systemic hypertension.  4.  Elevated left ventricular end-diastolic pressure.      Octavia Heir, MD  Electronically Signed     RHM/MEDQ  D:  12/17/2004  T:  12/17/2004  Job:  [id number]

## 2010-08-14 NOTE — Discharge Summary (Signed)
Hayley Miller, Hayley Miller                  ACCOUNT NO.:  [id number]   MEDICAL RECORD NO.:  [id number]          PATIENT TYPE:  INP   LOCATION:  [id number]                         FACILITY:  [facility]   PHYSICIAN:  Octavia Heir, MD  DATE OF BIRTH:  09-24-1960   DATE OF ADMISSION:  01/07/2005  DATE OF DISCHARGE:  01/08/2005                                 DISCHARGE SUMMARY   DISCHARGE DIAGNOSES:  1.  Near-syncopal spell of unclear etiology.  2.  Known coronary disease with recent myocardial infarction, treated with      left anterior descending angioplasty on December 17, 2004.  3.  Left ventricular dysfunction with an ejection fraction of 35% to 40%.  4.  Labile hypertension.  5.  Presumed urinary tract infection, discharged on antibiotics.   HOSPITAL COURSE:  The patient is a 50 year old female who was initially  admitted December 17, 2004.  She presented to the emergency room with  substernal chest pain.  She recently had been on a cruise and had been taken  off the ship and hospitalized in the [place] for chest pain earlier  in the week.  She left after 24 [place]  In the  emergency room on December 17, 2004 she had inferior ST elevation.  She was  taken to the cath lab by Dr. Tami Ribas; catheterization revealed normal RCA,  normal left main, normal circumflex and a total mid LAD with faint  collaterals.  Her EF was 35% to 40%.  She underwent PTCA to the LAD lesion.  She had recurrent chest pain after this and plan was for medical therapy.  It was felt we would let her go head and occlude her LAD and treated her  medically.  Echocardiogram prior to discharge showed an improvement in her  ejection fraction from 40% to 50%.  She was discharged home and seen in the  office on October 2006 by Dr. Tami Ribas.  We made some adjustments in her  medicine.  She was admitted January 07, 2005 with a near-syncopal spell.  Apparently, her blood sugar at the scene was low,  according to EMS, although  she is not a diabetic.  Her blood sugars during her hospitalization were  stable, over 100.  She was admitted to telemetry and CT angiogram of the  chest was obtained and was negative for pulmonary embolism.  There is no  evidence of DVT on Doppler.  Telemetry was stable.  Troponins were negative.  She was not orthostatic.  We feel she can be discharged January 08, 2005.   DISCHARGE MEDICATIONS:  1.  Caduet 10/10 daily.  2.  Coreg 6.25 mg twice a Altmann .  3.  Aceon 8 mg a Kun.  4.  Coated aspirin once a Pilat.  5.  Plavix 75 mg a Frogge.  6.  BiDil three times daily.  7.  We added Septra DS one twice daily for 3 days.   LABORATORY DATA:  White count 7.3, hemoglobin 11.3, hematocrit 33.1,  platelets 42,000.  Sodium 139, potassium 4.1, BUN 11, creatinine 0.9.  Liver  functions are normal.  Troponins are negative x3.  CKs were slightly  elevated.  D-dimer is less than 0.22.  Serum pregnancy is negative.  Hemoglobin A1c is 4.8.  TSH is normal at 1.506.  Drug screen is negative .   Chest x-ray shows mild cardiomegaly.   UA did show trace bacteria and trace leukocyte esterase.   DISPOSITION:  The patient is discharged in stable condition.  She will  follow up with Dr. Tami Ribas in a couple of weeks.  We did stop her Inspra and  she is on BiDil.      Erlene Quan, P.A.      Octavia Heir, MD  Electronically Signed    LKK/MEDQ  D:  01/08/2005  T:  01/09/2005  Job:  [id number]

## 2010-08-14 NOTE — Assessment & Plan Note (Signed)
[facility]                                   ON-CALL NOTE   JAHLAYA, POLA                           MRN:          [id number]  DATE:02/07/2006                            DOB:          February 05, 1961    PRIMARY CARE PHYSICIAN:  Dr. Alain Marion.   Ms. Souffrant mother calls in stating that she has been vomiting for the last  hour.  According to the mother, Mrs. Horan has a history of two myocardial  infarctions in the past.  She did advise me that she is currently  complaining of chest pain.   PLAN:  I advised her mother to immediately call 9-1-1 to have Mrs. Solimine  transferred to the emergency department immediately.  Mother expressed  understanding.    ______________________________  Leone Haven, M.D.    LA/MedQ  DD: 02/07/2006  DT: 02/07/2006  Job #: [id number]   cc:   Evie Lacks. Plotnikov, MD

## 2010-09-21 ENCOUNTER — Encounter: Payer: Self-pay | Admitting: Internal Medicine

## 2010-10-21 ENCOUNTER — Other Ambulatory Visit: Payer: Self-pay | Admitting: *Deleted

## 2010-10-21 MED ORDER — METOCLOPRAMIDE HCL 5 MG PO TABS: 5.0000 mg | ORAL_TABLET | Freq: Four times a day (QID) | ORAL | Status: DC

## 2010-10-22 ENCOUNTER — Other Ambulatory Visit: Payer: Self-pay | Admitting: *Deleted

## 2010-10-22 MED ORDER — OMEPRAZOLE 20 MG PO CPDR: 20.0000 mg | DELAYED_RELEASE_CAPSULE | Freq: Two times a day (BID) | ORAL | Status: DC

## 2010-12-22 ENCOUNTER — Other Ambulatory Visit: Payer: Self-pay | Admitting: *Deleted

## 2010-12-22 MED ORDER — HYDROCHLOROTHIAZIDE 25 MG PO TABS: 25.0000 mg | ORAL_TABLET | Freq: Every day | ORAL | Status: DC

## 2010-12-22 MED ORDER — LABETALOL HCL 200 MG PO TABS: 400.0000 mg | ORAL_TABLET | Freq: Two times a day (BID) | ORAL | Status: DC

## 2010-12-24 LAB — POCT I-STAT, CHEM 8
Chloride: 107
Glucose, Bld: 83
HCT: 44
Hemoglobin: 15
Potassium: 4.1

## 2010-12-24 LAB — CBC
HCT: 40.3
Hemoglobin: 13.5
MCHC: 33.5
MCV: 92.8
Platelets: 371
RBC: 4.34
RDW: 13.2
WBC: 8.7

## 2010-12-24 LAB — DIFFERENTIAL
Basophils Absolute: 0
Basophils Relative: 0
Eosinophils Absolute: 0.1
Eosinophils Relative: 1
Lymphocytes Relative: 17
Lymphs Abs: 1.5
Monocytes Absolute: 0.7
Monocytes Relative: 8
Neutro Abs: 6.5
Neutrophils Relative %: 74

## 2010-12-24 LAB — CARDIAC PANEL(CRET KIN+CKTOT+MB+TROPI)
CK, MB: 0.7
Relative Index: INVALID
Relative Index: INVALID
Total CK: 56
Total CK: 82
Troponin I: 0.01

## 2010-12-24 LAB — TROPONIN I

## 2010-12-24 LAB — POCT CARDIAC MARKERS
CKMB, poc: <1 — ABNORMAL LOW
Operator id: 151321
Troponin i, poc: <0.05

## 2010-12-24 LAB — B-NATRIURETIC PEPTIDE (CONVERTED LAB): Pro B Natriuretic peptide (BNP): 58

## 2010-12-24 LAB — D-DIMER, QUANTITATIVE: D-Dimer, Quant: <0.22

## 2010-12-24 LAB — CK TOTAL AND CKMB (NOT AT ARMC): CK, MB: 0.6

## 2010-12-29 LAB — CBC
HCT: 34.5 — ABNORMAL LOW
HCT: 41.3
Hemoglobin: 10.8 — ABNORMAL LOW
Hemoglobin: 11.3 — ABNORMAL LOW
MCHC: 32.5
MCV: 96.1
RBC: 3.4 — ABNORMAL LOW
RBC: 4.3
WBC: 12.6 — ABNORMAL HIGH
WBC: 5.1
WBC: 5.2

## 2010-12-29 LAB — OVA AND PARASITE EXAMINATION: Ova and parasites: NONE SEEN

## 2010-12-29 LAB — CLOTEST (H. PYLORI), BIOPSY: Helicobacter screen: NEGATIVE

## 2010-12-29 LAB — URINALYSIS, ROUTINE W REFLEX MICROSCOPIC
Bilirubin Urine: NEGATIVE
Nitrite: NEGATIVE
Specific Gravity, Urine: 1.02
pH: 5.5

## 2010-12-29 LAB — CARDIAC PANEL(CRET KIN+CKTOT+MB+TROPI)
CK, MB: 0.6
CK, MB: 0.6
Relative Index: INVALID
Total CK: 82
Troponin I: <0.01

## 2010-12-29 LAB — COMPREHENSIVE METABOLIC PANEL
BUN: 23
CO2: 23
Calcium: 10
Chloride: 101
Creatinine, Ser: 1.38 — ABNORMAL HIGH
GFR calc non Af Amer: 41 — ABNORMAL LOW
Total Bilirubin: 1

## 2010-12-29 LAB — BASIC METABOLIC PANEL
CO2: 26
CO2: 26
CO2: 28
Calcium: 7.6 — ABNORMAL LOW
Calcium: 7.8 — ABNORMAL LOW
Calcium: 8.2 — ABNORMAL LOW
Calcium: 9.1
Chloride: 107
Creatinine, Ser: 0.82
GFR calc Af Amer: >60
GFR calc Af Amer: >60
GFR calc Af Amer: >60
GFR calc non Af Amer: >60
GFR calc non Af Amer: >60
GFR calc non Af Amer: >60
Glucose, Bld: 92
Potassium: 2.6 — CL
Sodium: 136
Sodium: 137
Sodium: 139
Sodium: 140

## 2010-12-29 LAB — DIFFERENTIAL
Basophils Relative: 0
Eosinophils Absolute: 0.1
Eosinophils Relative: 1
Monocytes Absolute: 0.4
Monocytes Relative: 3

## 2010-12-29 LAB — HEMOGLOBIN AND HEMATOCRIT, BLOOD: HCT: 34.2 — ABNORMAL LOW

## 2010-12-29 LAB — URINE MICROSCOPIC-ADD ON

## 2010-12-29 LAB — POCT PREGNANCY, URINE: Preg Test, Ur: NEGATIVE

## 2010-12-29 LAB — CK TOTAL AND CKMB (NOT AT ARMC): CK, MB: 0.7

## 2010-12-29 LAB — TROPONIN I: Troponin I: <0.01

## 2010-12-29 LAB — POTASSIUM: Potassium: 3.1 — ABNORMAL LOW

## 2011-01-27 ENCOUNTER — Other Ambulatory Visit: Payer: Self-pay | Admitting: *Deleted

## 2011-01-27 MED ORDER — METOCLOPRAMIDE HCL 5 MG PO TABS: 5.0000 mg | ORAL_TABLET | Freq: Four times a day (QID) | ORAL | Status: DC

## 2011-04-01 ENCOUNTER — Other Ambulatory Visit: Payer: Self-pay | Admitting: *Deleted

## 2011-04-01 MED ORDER — AMITRIPTYLINE HCL 100 MG PO TABS: 100.0000 mg | ORAL_TABLET | Freq: Every day | ORAL | Status: DC

## 2011-04-01 MED ORDER — HYDROCHLOROTHIAZIDE 25 MG PO TABS: 25.0000 mg | ORAL_TABLET | Freq: Every day | ORAL | Status: DC

## 2011-04-20 ENCOUNTER — Encounter: Payer: Self-pay | Admitting: Internal Medicine

## 2011-04-20 ENCOUNTER — Other Ambulatory Visit: Payer: Self-pay | Admitting: *Deleted

## 2011-04-20 MED ORDER — METOCLOPRAMIDE HCL 5 MG PO TABS: 5.0000 mg | ORAL_TABLET | Freq: Four times a day (QID) | ORAL | Status: DC

## 2011-04-26 ENCOUNTER — Other Ambulatory Visit: Payer: Self-pay | Admitting: *Deleted

## 2011-04-26 MED ORDER — OMEPRAZOLE 20 MG PO CPDR: 20.0000 mg | DELAYED_RELEASE_CAPSULE | Freq: Two times a day (BID) | ORAL | Status: DC

## 2011-05-18 ENCOUNTER — Encounter: Payer: 59 | Admitting: Internal Medicine

## 2011-05-18 DIAGNOSIS — Z0289 Encounter for other administrative examinations: Secondary | ICD-10-CM

## 2011-06-15 ENCOUNTER — Other Ambulatory Visit: Payer: Self-pay | Admitting: *Deleted

## 2011-06-16 ENCOUNTER — Other Ambulatory Visit: Payer: Self-pay | Admitting: *Deleted

## 2011-06-16 MED ORDER — AMITRIPTYLINE HCL 100 MG PO TABS: 100.0000 mg | ORAL_TABLET | Freq: Every day | ORAL | Status: DC

## 2011-06-16 MED ORDER — LABETALOL HCL 200 MG PO TABS: 400.0000 mg | ORAL_TABLET | Freq: Two times a day (BID) | ORAL | Status: DC

## 2011-06-17 ENCOUNTER — Telehealth: Payer: Self-pay | Admitting: *Deleted

## 2011-06-17 MED ORDER — OMEPRAZOLE 20 MG PO CPDR: 20.0000 mg | DELAYED_RELEASE_CAPSULE | Freq: Two times a day (BID) | ORAL | Status: DC

## 2011-06-17 MED ORDER — RANITIDINE HCL 150 MG PO CAPS: 150.0000 mg | ORAL_CAPSULE | Freq: Two times a day (BID) | ORAL | Status: DC

## 2011-06-17 NOTE — Telephone Encounter (Signed)
Stating need refill on her ranitidine & omeprazole sent to [facility]. Inform pt will send med to her pharmacy... 06/17/11@11 :40am/LMB

## 2011-06-25 ENCOUNTER — Other Ambulatory Visit: Payer: Self-pay | Admitting: Nurse Practitioner

## 2011-06-25 MED ORDER — NITROGLYCERIN 0.4 MG SL SUBL: 0.4000 mg | SUBLINGUAL_TABLET | SUBLINGUAL | Status: DC | PRN

## 2011-07-01 ENCOUNTER — Ambulatory Visit (INDEPENDENT_AMBULATORY_CARE_PROVIDER_SITE_OTHER): Payer: Medicare Other | Admitting: Internal Medicine

## 2011-07-01 ENCOUNTER — Encounter: Payer: Self-pay | Admitting: Internal Medicine

## 2011-07-01 VITALS — BP 132/100 | HR 79 | Temp 97.6°F | Resp 16 | Ht 64.0 in | Wt 186.0 lb

## 2011-07-01 DIAGNOSIS — F411 Generalized anxiety disorder: Secondary | ICD-10-CM

## 2011-07-01 DIAGNOSIS — K219 Gastro-esophageal reflux disease without esophagitis: Secondary | ICD-10-CM

## 2011-07-01 DIAGNOSIS — M545 Low back pain, unspecified: Secondary | ICD-10-CM

## 2011-07-01 DIAGNOSIS — E785 Hyperlipidemia, unspecified: Secondary | ICD-10-CM

## 2011-07-01 DIAGNOSIS — I10 Essential (primary) hypertension: Secondary | ICD-10-CM

## 2011-07-01 MED ORDER — AMITRIPTYLINE HCL 100 MG PO TABS: 100.0000 mg | ORAL_TABLET | Freq: Every day | ORAL | Status: DC

## 2011-07-01 MED ORDER — CLONAZEPAM 1 MG PO TABS: 1.0000 mg | ORAL_TABLET | Freq: Two times a day (BID) | ORAL | Status: DC | PRN

## 2011-07-01 MED ORDER — PRAVASTATIN SODIUM 40 MG PO TABS: 40.0000 mg | ORAL_TABLET | Freq: Every day | ORAL | Status: DC

## 2011-07-01 MED ORDER — CITALOPRAM HYDROBROMIDE 40 MG PO TABS: 40.0000 mg | ORAL_TABLET | Freq: Every day | ORAL | Status: DC

## 2011-07-01 MED ORDER — GEMFIBROZIL 600 MG PO TABS: 600.0000 mg | ORAL_TABLET | Freq: Two times a day (BID) | ORAL | Status: DC

## 2011-07-01 MED ORDER — TRAMADOL HCL 50 MG PO TABS: 50.0000 mg | ORAL_TABLET | Freq: Three times a day (TID) | ORAL | Status: DC | PRN

## 2011-07-01 MED ORDER — MOMETASONE FUROATE 50 MCG/ACT NA SUSP: 2.0000 | Freq: Every day | NASAL | Status: DC

## 2011-07-01 NOTE — Assessment & Plan Note (Signed)
Noncompliance Refill and resume prior meds before check levels or making dose adjustment - prava+ lopid

## 2011-07-01 NOTE — Patient Instructions (Addendum)
It was good to see you today. We have reviewed your prior records including labs and tests today Medications reviewed, no changes at this time. Refill on medication(s) as discussed today. we'll make referral to new behavioral health counselor and psychiatrist for your anxiety. Our office will contact you regarding appointment(s) once made. Tramadol to use as needed for pain -  Your prescription(s) have been submitted to your pharmacy. Please take as directed and contact our office if you believe you are having problem(s) with the medication(s). Please schedule followup in 3-4 months for cholesterol and BP recheck, call sooner if problems.

## 2011-07-01 NOTE — Assessment & Plan Note (Signed)
exac by med noncompliance Refill and resume prior meds - follow up next OV if persisting symptoms

## 2011-07-01 NOTE — Assessment & Plan Note (Signed)
BP Readings from Last 3 Encounters:  07/01/11 132/100  04/13/10 124/68  03/10/10 108/64   Reminded of need for compliance Refills today

## 2011-07-01 NOTE — Progress Notes (Signed)
Subjective:    Patient ID: Hayley Miller, female    DOB: [birth year], 51 y.o.   MRN: [id number]  HPI Here for follow up - last seen 11/2009 by me Reviewed chronic medical issues - out of meds > 24mo   CAD - hx MI with angio 2006 - repeat hosp 10/2009 for CP symptoms - noncardiac etiology -    HTN - reports variable compliance with ongoing medical treatment and no changes in medication dose or frequency. denies adverse side effects related to current therapy. no HA or edema   dyslipidemia -prescribed statin and Lopid - reports variable compliance with ongoing medical treatment and no changes in medication dose or frequency. denies adverse side effects related to current therapy.     Anxiety/depression - complicated by insomnia, chronic history of same -previously followed with psyc and counseling for same, but no behavioral health systems in greater than 6 months. Agreeable to reestablish care because of increased panic attack and anxiety symptoms. Reports noncompliance with medications greater than 3 months. Denies SI/HI    GERD - reports uncontrolled indigestion. Complicated by medical non-compliance with prescribed therapy. Denies abdominal pain, bowel change, nausea or vomiting.   Past Medical History  Diagnosis Date  . HTN (hypertension)   . HLD (hyperlipidemia)   . CAD (coronary artery disease) 11/2004    s/p PCI 9/06 due to ant MI  . Chronic pain syndrome   . Hemorrhage     upper gastrointestional. GI bleed 10/09 due to NSAID  . Depression   . GERD (gastroesophageal reflux disease)   . Anxiety   . Fibromyalgia   . Rhinitis        Review of Systems  Constitutional: Positive for fatigue. Negative for fever.  Genitourinary: Negative for dysuria and hematuria.  Musculoskeletal: Positive for back pain (chronic). Negative for joint swelling and gait problem.  Psychiatric/Behavioral: Positive for agitation. Negative for hallucinations, behavioral problems, confusion and self-injury.  The patient is nervous/anxious.        Objective:   Physical Exam BP 132/100  Pulse 79  Temp(Src) 97.6 F (36.4 C) (Oral)  Resp 16  Ht 5\' 4"  (1.626 m)  Wt 186 lb (84.369 kg)  BMI 31.93 kg/m2  SpO2 97%  LMP 06/21/2011 Constitutional: She appears well-developed and well-nourished. No distress.  friend at side HENT: Head: Normocephalic and atraumatic. Ears: B TMs ok, no erythema or effusion; Nose: Nose normal. Mouth/Throat: Oropharynx is clear and moist. No oropharyngeal exudate.  Eyes: Conjunctivae and EOM are normal. Pupils are equal, round, and reactive to light. No scleral icterus.  Neck: Normal range of motion. Neck supple. No JVD present. No thyromegaly present.  Cardiovascular: Normal rate, regular rhythm and normal heart sounds.  No murmur heard. No BLE edema. Pulmonary/Chest: Effort normal and breath sounds normal. No respiratory distress. She has no wheezes.  Neurological: She is alert and oriented to person, place, and time. No cranial nerve deficit. Coordination normal.  Psychiatric: She has minimally anxious mood and affect, slightly aggressive manner. Her behavior is othewise normal. Judgment and thought content normal.   Lab Results  Component Value Date   WBC 10.2 12/21/2009   HGB 9.1* 12/21/2009   HCT 28.6* 12/21/2009   PLT 455* 12/21/2009   GLUCOSE 119* 12/21/2009   CHOL  Value: 203        ATP III CLASSIFICATION:  <200     mg/dL   Desirable  200-239  mg/dL   Borderline High  >=240    mg/dL  High       * 11/14/2009   TRIG 444* 11/14/2009   HDL 47 11/14/2009   LDLDIRECT 125.0 06/21/2008   LDLCALC  Value: UNABLE TO CALCULATE IF TRIGLYCERIDE OVER 400 mg/dL         11/14/2009   ALT 38* 12/21/2009   AST 37 12/21/2009   NA 136 12/21/2009   K 3.5 12/21/2009   CL 102 12/21/2009   CREATININE 1.26* 12/21/2009   BUN 11 12/21/2009   CO2 27 12/21/2009   TSH 2.475 12/21/2009   INR 1.00 11/14/2009   HGBA1C 4.8 11/21/2009        Assessment & Plan:   See problem list. Medications and  labs reviewed today. Time spent with pt/family today 25 minutes, greater than 50% time spent counseling patient on need for compliance with medications, anxiety/depression, uncontrolled reflux and back pain plus medication review. Also review of interval records

## 2011-07-01 NOTE — Assessment & Plan Note (Signed)
Long hx same - noncompliance with meds No longer following wit psyc or BH Refill prior meds and resume prior to making dose change Refer to new [facility] for asst with med mgmt of same

## 2011-07-02 ENCOUNTER — Other Ambulatory Visit: Payer: Self-pay

## 2011-07-02 MED ORDER — PROMETHAZINE HCL 25 MG PO TABS: 25.0000 mg | ORAL_TABLET | Freq: Four times a day (QID) | ORAL | Status: DC | PRN

## 2011-07-02 NOTE — Telephone Encounter (Signed)
Promethazine prn n/v

## 2011-07-02 NOTE — Telephone Encounter (Signed)
Pt called stating Ultram is causing nausea with vomiting. Pt is requesting alternative medication or Rx for nausea, please advise.

## 2011-07-02 NOTE — Telephone Encounter (Signed)
Pt advised of Rx/pharmacy 

## 2011-07-06 ENCOUNTER — Telehealth: Payer: Self-pay

## 2011-07-06 NOTE — Telephone Encounter (Signed)
Agree - stop ultram -  use reglan and phenergan as ordered for n/v -  tylenol as needed for pain, nothing stronger as other nacotics will exac chronic gastroparesis causing worse n/v

## 2011-07-06 NOTE — Telephone Encounter (Signed)
Pt advised and expressed understanding.

## 2011-07-06 NOTE — Telephone Encounter (Signed)
Pt called stating that Ultram is causing her severe vomiting. Pt advised to D/C medication until MD advises further. Pt denies fever, diarrhea, abd pain or blood in emesis, please advise.

## 2011-07-12 ENCOUNTER — Other Ambulatory Visit: Payer: Self-pay | Admitting: *Deleted

## 2011-07-12 MED ORDER — PROMETHAZINE HCL 25 MG PO TABS: 25.0000 mg | ORAL_TABLET | Freq: Four times a day (QID) | ORAL | Status: DC | PRN

## 2011-07-14 ENCOUNTER — Other Ambulatory Visit (INDEPENDENT_AMBULATORY_CARE_PROVIDER_SITE_OTHER): Payer: Medicare Other

## 2011-07-14 ENCOUNTER — Telehealth: Payer: Self-pay | Admitting: Gastroenterology

## 2011-07-14 ENCOUNTER — Encounter: Payer: Self-pay | Admitting: Internal Medicine

## 2011-07-14 ENCOUNTER — Ambulatory Visit (INDEPENDENT_AMBULATORY_CARE_PROVIDER_SITE_OTHER): Payer: Medicare Other | Admitting: Internal Medicine

## 2011-07-14 VITALS — BP 102/80 | HR 81 | Temp 98.0°F | Resp 16 | Wt 183.0 lb

## 2011-07-14 DIAGNOSIS — K920 Hematemesis: Secondary | ICD-10-CM

## 2011-07-14 DIAGNOSIS — E039 Hypothyroidism, unspecified: Secondary | ICD-10-CM

## 2011-07-14 DIAGNOSIS — R112 Nausea with vomiting, unspecified: Secondary | ICD-10-CM

## 2011-07-14 DIAGNOSIS — K219 Gastro-esophageal reflux disease without esophagitis: Secondary | ICD-10-CM

## 2011-07-14 LAB — BASIC METABOLIC PANEL
BUN: 15 mg/dL (ref 6–23)
CO2: 31 mEq/L (ref 19–32)
Chloride: 100 mEq/L (ref 96–112)
Creatinine, Ser: 1.1 mg/dL (ref 0.4–1.2)

## 2011-07-14 LAB — CBC WITH DIFFERENTIAL/PLATELET
Eosinophils Absolute: 0.2 10*3/uL (ref 0.0–0.7)
MCHC: 30.8 g/dL (ref 30.0–36.0)
MCV: 74.5 fl — ABNORMAL LOW (ref 78.0–100.0)
Monocytes Absolute: 1.2 10*3/uL — ABNORMAL HIGH (ref 0.1–1.0)
Neutrophils Relative %: 65.4 % (ref 43.0–77.0)
Platelets: 580 10*3/uL — ABNORMAL HIGH (ref 150.0–400.0)

## 2011-07-14 LAB — HEPATIC FUNCTION PANEL
Alkaline Phosphatase: 90 U/L (ref 39–117)
Bilirubin, Direct: 0 mg/dL (ref 0.0–0.3)

## 2011-07-14 LAB — TSH: TSH: 2.86 u[IU]/mL (ref 0.35–5.50)

## 2011-07-14 NOTE — Patient Instructions (Signed)
It was good to see you today. Test(s) ordered today. Your results will be called to you after review (48-72hours after test completion). If any changes need to be made, you will be notified at that time. Medications reviewed, no changes at this time. we'll make referral to gastroenterology for your vomitting. Our office will contact you regarding appointment(s) once made. Tramadol to use as needed for pain -  Please schedule followup in 3-4 months for cholesterol and BP recheck, call sooner if problems.

## 2011-07-14 NOTE — Assessment & Plan Note (Signed)
Recurrent symptoms - currently ongoing for 3 weeks - No fever, no diarrhea occasional hematemesis - Hx same 12/2007 - gastritis on EGD On max PPI and H2B Also on reglan qid for possible gastroparesis and promethazine for nausea control Nothing helps - will refer to GI for reeval and tx of same

## 2011-07-14 NOTE — Progress Notes (Signed)
Subjective:    Patient ID: Hayley Miller, female    DOB: 1960/11/17, 51 y.o.   MRN: [id number]  Gastrophageal Reflux She complains of abdominal pain, early satiety, heartburn and nausea. She reports no belching, no chest pain, no coughing, no globus sensation, no hoarse voice or no wheezing. This is a recurrent problem. The current episode started 1 to 4 weeks ago. The problem occurs frequently. The problem has been gradually worsening. The heartburn duration is an hour. The heartburn is located in the substernum and abdomen. The heartburn is of severe intensity. The heartburn wakes her from sleep. The heartburn doesn't change with position. The symptoms are aggravated by certain foods and lying down. Associated symptoms include fatigue. Pertinent negatives include no weight loss. She has tried a PPI, a histamine-2 antagonist, a pro-motility drug and an antacid for the symptoms. The treatment provided no relief. Past procedures include an EGD (12/2007 = gastritis).    Reviewed chronic medical issues -   CAD - hx MI with angio 2006 - repeat hosp 10/2009 for chest pain symptoms - noncardiac etiology -    HTN - reports variable compliance with ongoing medical treatment and no changes in medication dose or frequency. denies adverse side effects related to current therapy. no headache or edema   dyslipidemia -prescribed statin and Lopid - reports variable compliance with ongoing medical treatment and no changes in medication dose or frequency. denies adverse side effects related to current therapy.     Anxiety/depression - complicated by insomnia, chronic history of same -previously followed with psyc and counseling for same, but no behavioral health systems in greater than 6 months. Agreeable to reestablish care because of increased panic attack and anxiety symptoms. Denies SI/HI    GERD - reports uncontrolled indigestion. Complicated by medical non-compliance with prescribed therapy. See above.   Past  Medical History  Diagnosis Date  . HTN (hypertension)   . HLD (hyperlipidemia)   . CAD (coronary artery disease) 11/2004    s/p PCI 9/06 due to ant MI  . Chronic pain syndrome   . Hemorrhage     upper gastrointestional. GI bleed 10/09 due to NSAID  . Depression   . GERD (gastroesophageal reflux disease)   . Anxiety   . Fibromyalgia   . Rhinitis        Review of Systems  Constitutional: Positive for fatigue. Negative for fever and weight loss.  HENT: Negative for hoarse voice.   Respiratory: Negative for cough and wheezing.   Cardiovascular: Negative for chest pain.  Gastrointestinal: Positive for heartburn, nausea and abdominal pain.  Genitourinary: Negative for dysuria and hematuria.  Musculoskeletal: Positive for back pain (chronic). Negative for joint swelling and gait problem.  Psychiatric/Behavioral: Positive for agitation. Negative for hallucinations, behavioral problems, confusion and self-injury. The patient is nervous/anxious.        Objective:   Physical Exam  BP 102/80  Pulse 81  Temp(Src) 98 F (36.7 C) (Oral)  Resp 16  Wt 183 lb (83.008 kg)  SpO2 99%  LMP 06/21/2011 Wt Readings from Last 3 Encounters:  07/14/11 183 lb (83.008 kg)  07/01/11 186 lb (84.369 kg)  04/13/10 188 lb 2.1 oz (85.336 kg)   Constitutional: She appears well-developed and well-nourished. No distress.  friend at side Cardiovascular: Normal rate, regular rhythm and normal heart sounds.  No murmur heard. No BLE edema. Pulmonary/Chest: Effort normal and breath sounds normal. No respiratory distress. She has no wheezes.  Abd: mild distension and mildly diminished BS, no  rebound or guarding, no mass Psychiatric: She has minimally anxious mood and affect, slightly aggressive manner. Her behavior is othewise normal. Judgment and thought content normal.        Assessment & Plan:   See problem list. Medications and labs reviewed today.

## 2011-07-14 NOTE — Telephone Encounter (Signed)
Pt saw Dr. Basilio Cairo today and reports she has been having nausea and vomiting, vomiting blood for about 3 months. Pt states that recently it has gotten worse, requesting pt be seen asap. Pt scheduled to see Nicoletta Ba PA tomorrow at [facility] to notify pt of appt date and time.

## 2011-07-15 ENCOUNTER — Encounter: Payer: Self-pay | Admitting: Physician Assistant

## 2011-07-15 ENCOUNTER — Other Ambulatory Visit (INDEPENDENT_AMBULATORY_CARE_PROVIDER_SITE_OTHER): Payer: 59

## 2011-07-15 ENCOUNTER — Ambulatory Visit (INDEPENDENT_AMBULATORY_CARE_PROVIDER_SITE_OTHER): Payer: Medicare Other | Admitting: Physician Assistant

## 2011-07-15 VITALS — BP 108/82 | HR 84 | Ht 63.0 in | Wt 184.1 lb

## 2011-07-15 DIAGNOSIS — K920 Hematemesis: Secondary | ICD-10-CM

## 2011-07-15 DIAGNOSIS — R112 Nausea with vomiting, unspecified: Secondary | ICD-10-CM

## 2011-07-15 LAB — IBC PANEL
Iron: 22 ug/dL — ABNORMAL LOW (ref 42–145)
Transferrin: 384.3 mg/dL — ABNORMAL HIGH (ref 212.0–360.0)

## 2011-07-15 MED ORDER — ONDANSETRON 4 MG PO TBDP: ORAL_TABLET | ORAL | Status: DC

## 2011-07-15 MED ORDER — DEXLANSOPRAZOLE 60 MG PO CPDR: DELAYED_RELEASE_CAPSULE | ORAL | Status: DC

## 2011-07-15 NOTE — Patient Instructions (Signed)
We sent zofran prescription to [facility]. We have given samples of Dexilant 60 mg.  Take this in place of Prilosec.  1 tab before breakfast.  We scheduled the Endoscopy with Dr. Fuller Plan on Mon 07-19-2011. Directions provided.

## 2011-07-15 NOTE — Progress Notes (Addendum)
Subjective:    Patient ID: Hayley Miller, female    DOB: 08/24/1960, 51 y.o.   MRN: [id number]  HPI Hayley Miller is a 51 year old female known to Dr. Fuller Plan  from upper endoscopy done in 2009. At that time she had presented with nausea vomiting and hematemesis and was found to have erosive gastritis and moderate reflux esophagitis. CLOtest and was negative. She has been maintained on a regimen of Prilosec twice daily and Reglan 5 mg before meals and at bedtime. She also has history of coronary artery disease hypertension and chronic pain syndrome anxiety depression and hyperlipidemia She was seen by primary care earlier in the week and had a CBC done that showed a hemoglobin of 10.3 hematocrit 33 and an MCV of 74. Review of previous labs shows in 2011 her hemoglobin was 9.6 hematocrit of 30.2 in September of 2011 hemoglobin 9.1 hematocrit of 28.6.  Patient comes in today stating that she has had nausea and vomiting over the past couple of months which initially was occurring just once a week and has gradually gotten worker progressive to the point where she is vomiting at least once daily. He says when she does vomit she will bring up streaks of red blood. She has no complaint of abdominal pain. She denies any dysphagia or odynophagia. She is having daily heartburn. She says her bowel movements have been fairly regular but she does see very dark stools on occasion She is not sure when she last saw any dark stool but apparently had been taking some Pepto-Bismol intermittently.  She is not on any blood thinners but does take an aspirin daily. She has Phenergan which it sounds like she uses for her nausea and vomiting but is not finding a very effective.    Review of Systems  Constitutional: Negative.   HENT: Negative.   Eyes: Negative.   Respiratory: Negative.   Cardiovascular: Negative.   Gastrointestinal: Positive for nausea and vomiting.  Genitourinary: Negative.   Musculoskeletal: Negative.   Skin:  Negative.   Neurological: Negative.   Hematological: Negative.   Psychiatric/Behavioral: Negative.    Outpatient Prescriptions Prior to Visit  Medication Sig Dispense Refill  . amitriptyline (ELAVIL) 100 MG tablet Take 1 tablet (100 mg total) by mouth at bedtime.  30 tablet  11  . aspirin 81 MG EC tablet Take 81 mg by mouth daily.        . citalopram (CELEXA) 40 MG tablet Take 1 tablet (40 mg total) by mouth daily.  30 tablet  11  . clonazePAM (KLONOPIN) 1 MG tablet Take 1 tablet (1 mg total) by mouth 2 (two) times daily as needed for anxiety.  60 tablet  1  . gemfibrozil (LOPID) 600 MG tablet Take 1 tablet (600 mg total) by mouth 2 (two) times daily.  60 tablet  11  . hydrochlorothiazide (HYDRODIURIL) 25 MG tablet Take 1 tablet (25 mg total) by mouth daily.  30 tablet  1  . labetalol (NORMODYNE) 200 MG tablet Take 2 tablets (400 mg total) by mouth 2 (two) times daily.  120 tablet  5  . loratadine-pseudoephedrine (CLARITIN-D 12 HOUR) 5-120 MG per tablet Take 1 tablet by mouth daily.        . metoCLOPramide (REGLAN) 5 MG tablet Take 1 tablet (5 mg total) by mouth 4 (four) times daily. Take 1 tablet before meals and at bedtime as needed  60 tablet  2  . mometasone (NASONEX) 50 MCG/ACT nasal spray Place 2 sprays into the nose  at bedtime.  17 g  1  . nitroGLYCERIN (NITROSTAT) 0.4 MG SL tablet Place 1 tablet (0.4 mg total) under the tongue every 5 (five) minutes as needed for chest pain.  25 tablet  3  . NON FORMULARY Mega red 300 mg (OVC). take once daily       . omeprazole (PRILOSEC) 20 MG capsule Take 1 capsule (20 mg total) by mouth 2 (two) times daily. 15 min before meals  60 capsule  5  . pravastatin (PRAVACHOL) 40 MG tablet Take 1 tablet (40 mg total) by mouth at bedtime.  30 tablet  11  . promethazine (PHENERGAN) 25 MG tablet Take 1 tablet (25 mg total) by mouth every 6 (six) hours as needed for nausea.  30 tablet  0  . ranitidine (ZANTAC) 150 MG capsule Take 1 capsule (150 mg total) by  mouth 2 (two) times daily.  60 capsule  5   Allergies  Allergen Reactions  . Clonidine Hydrochloride     REACTION: FATIGUE  . Ibuprofen     REACTION: GI bleed  . Lovastatin     REACTION: pains       Objective:   Physical Exam well-developed African American female in no acute distress, accompanied by 2 family members, Blood pressure 108/82 pulse 84. HEENT; nontraumatic normocephalic EOMI PERRLA sclera anicteric, Cardiovascular ;regular rate and rhythm with S1-S2 no murmur gallop, Pulmonary; clear bilaterally, Abdomen; soft she is really nontender there is no palpable mass or hepatosplenomegaly no guarding or rebound, Rectal; exam dark brown stool which is actually Hemoccult-negative and amount, Extremities; no clubbing cyanosis or edema skin warm and dry, Psych; mood and affect flat, but appropriate        Assessment & Plan:  #86 51 year old female who with 2 month history of intermittent nausea and vomiting now progressing to daily nausea and vomiting x1 week. Patient with streaky hematemesis. I suspect she has a recurrent ulcerative esophagitis and/or gastritis/peptic ulcer disease,Although she is on chronic PPI therapy #2 microcytic anemia, rule out iron deficiency she appears to have a chronic anemia as well #3 coronary artery disease #4 chronic anxiety/depression. #5 possible history of gastroparesis I don't see it is documented in any recent records though she has apparently been on Reglan over the past few years.  Plan; Schedule for upper endoscopy with Dr. Fuller Plan, as was discussed with the patient in detail and she is agreeable to proceed. Check iron studies today. Switch from Prilosec to Dexilant 60 mg by mouth every morning patient was provided with several weeks' worth of samples Trial of Zofran OGT 4 mg every 6 hours as needed for nausea and vomiting.  Addendum: Reviewed and agree with management. Further recs can be made after endoscopy. Lajuan Lines. Pyrtle, M.D.   07/20/2011

## 2011-07-19 ENCOUNTER — Ambulatory Visit (AMBULATORY_SURGERY_CENTER): Payer: Medicare Other | Admitting: Gastroenterology

## 2011-07-19 ENCOUNTER — Encounter: Payer: Self-pay | Admitting: Gastroenterology

## 2011-07-19 ENCOUNTER — Other Ambulatory Visit: Payer: Self-pay | Admitting: *Deleted

## 2011-07-19 VITALS — BP 141/95 | HR 86 | Temp 97.2°F | Resp 20 | Ht 63.0 in | Wt 184.0 lb

## 2011-07-19 DIAGNOSIS — K920 Hematemesis: Secondary | ICD-10-CM

## 2011-07-19 DIAGNOSIS — E611 Iron deficiency: Secondary | ICD-10-CM

## 2011-07-19 DIAGNOSIS — R112 Nausea with vomiting, unspecified: Secondary | ICD-10-CM

## 2011-07-19 DIAGNOSIS — K219 Gastro-esophageal reflux disease without esophagitis: Secondary | ICD-10-CM

## 2011-07-19 DIAGNOSIS — D509 Iron deficiency anemia, unspecified: Secondary | ICD-10-CM

## 2011-07-19 MED ORDER — SODIUM CHLORIDE 0.9 % IV SOLN: 500.0000 mL | INTRAVENOUS | Status: DC

## 2011-07-19 MED ORDER — INTEGRA PLUS PO CAPS: ORAL_CAPSULE | ORAL | Status: DC

## 2011-07-19 NOTE — Op Note (Signed)
[facility] [address] [employer] [address]. [place],   [id number]  ENDOSCOPY PROCEDURE REPORT  PATIENT:  Hayley Miller, Hayley Miller  MR#:  [id number] BIRTHDATE:  26-Jul-1960, 51 yrs. old  GENDER:  female ENDOSCOPIST:  Norberto Sorenson T. Fuller Plan, MD, [facility]  PROCEDURE DATE:  07/19/2011 PROCEDURE:  EGD, diagnostic 43235 ASA CLASS:  Class II INDICATIONS:  nausea and vomiting, hematemesis, iron deficiency anemia MEDICATIONS:    These medications were titrated to patient response per physician's verbal order, Fentanyl 50 mcg IV, Versed 6 mg IV TOPICAL ANESTHETIC:  Cetacaine Spray DESCRIPTION OF PROCEDURE:   After the risks benefits and alternatives of the procedure were thoroughly explained, informed consent was obtained.  The LB [id number]-[id number] endoscope was introduced through the mouth and advanced to the second portion of the duodenum, without limitations.  The instrument was slowly withdrawn as the mucosa was fully examined. <<PROCEDUREIMAGES>> Esophagitis was found in the distal esophagus. It was erosive and friable. LA Class Grade B  Otherwise normal esophagus.  Multiple erosions were found in the fundus. Cameron erosions.  Otherwise normal stomach.  The duodenal bulb was normal in appearance, as was the postbulbar duodenum.    Retroflexed views revealed a hiatal hernia, 6 cm.  The scope was then withdrawn from the patient and the procedure completed.  COMPLICATIONS:  None  ENDOSCOPIC IMPRESSION: 1) Erosvive esophagitis 2) Cameron erosions 3) Hiatal hernia  RECOMMENDATIONS: 1) Anti-reflux regimen with 4 " bed blocks 2) Continue Dexilant 60 mg qam and ranitidine 300mg  qpm 3) OP follow-up in 4-6 weeks 4) Fe deficiency likely from above findings however will need colonoscopy to further evaluate once her UGI symptoms are under better control  Hayley Lamboy T. Fuller Plan, MD, Hayley Miller  n. eSIGNED:   Pricilla Riffle. [name] at 07/19/2011 01:50 Miller  [place], Olin Hauser, [id number]

## 2011-07-19 NOTE — Progress Notes (Signed)
Patient did not experience any of the following events: a burn prior to discharge; a fall within the facility; wrong site/side/patient/procedure/implant event; or a hospital transfer or hospital admission upon discharge from the facility. (G8907) Patient did not have preoperative order for IV antibiotic SSI prophylaxis. (G8918)  

## 2011-07-19 NOTE — Patient Instructions (Signed)
YOU HAD AN ENDOSCOPIC PROCEDURE TODAY AT THE [facility]: Refer to the procedure report that was given to you for any specific questions about what was found during the examination.  If the procedure report does not answer your questions, please call your gastroenterologist to clarify.  If you requested that your care partner not be given the details of your procedure findings, then the procedure report has been included in a sealed envelope for you to review at your convenience later.  YOU SHOULD EXPECT: Some feelings of bloating in the abdomen. Passage of more gas than usual.  Walking can help get rid of the air that was put into your GI tract during the procedure and reduce the bloating. If you had a lower endoscopy (such as a colonoscopy or flexible sigmoidoscopy) you may notice spotting of blood in your stool or on the toilet paper. If you underwent a bowel prep for your procedure, then you may not have a normal bowel movement for a few days.  DIET: Your first meal following the procedure should be a light meal and then it is ok to progress to your normal diet.  A half-sandwich or bowl of soup is an example of a good first meal.  Heavy or fried foods are harder to digest and may make you feel nauseous or bloated.  Likewise meals heavy in dairy and vegetables can cause extra gas to form and this can also increase the bloating.  Drink plenty of fluids but you should avoid alcoholic beverages for 24 hours.  ACTIVITY: Your care partner should take you home directly after the procedure.  You should plan to take it easy, moving slowly for the rest of the [place].  You can resume normal activity the [place] after the procedure however you should NOT DRIVE or use heavy machinery for 24 hours (because of the sedation medicines used during the test).    SYMPTOMS TO REPORT IMMEDIATELY: A gastroenterologist can be reached at any hour.  During normal business hours, 8:30 AM to 5:00 PM Monday through Friday,  call [phone].  After hours and on weekends, please call the GI answering service at [phone] who will take a message and have the physician on call contact you.    Following upper endoscopy (EGD)  Vomiting of blood or coffee ground material  New chest pain or pain under the shoulder blades  Painful or persistently difficult swallowing  New shortness of breath  Fever of 100F or higher  Black, tarry-looking stools  FOLLOW UP: If any biopsies were taken you will be contacted by phone or by letter within the next 1-3 weeks.  Call your gastroenterologist if you have not heard about the biopsies in 3 weeks.  Our staff will call the home number listed on your records the next business Hew following your procedure to check on you and address any questions or concerns that you may have at that time regarding the information given to you following your procedure. This is a courtesy call and so if there is no answer at the home number and we have not heard from you through the emergency physician on call, we will assume that you have returned to your regular daily activities without incident.  SIGNATURES/CONFIDENTIALITY: You and/or your care partner have signed paperwork which will be entered into your electronic medical record.  These signatures attest to the fact that that the information above on your After Visit Summary has been reviewed and is understood.  Full   responsibility of the confidentiality of this discharge information lies with you and/or your care-partner.   Resume all medications.Schedule follow up with DR. STARK IN 4-6 WEEKS.Information given for reflux and esophagitis.

## 2011-07-20 ENCOUNTER — Other Ambulatory Visit: Payer: Self-pay | Admitting: *Deleted

## 2011-07-20 ENCOUNTER — Telehealth: Payer: Self-pay

## 2011-07-20 MED ORDER — TRAMADOL HCL 50 MG PO TABS: 50.0000 mg | ORAL_TABLET | Freq: Three times a day (TID) | ORAL | Status: DC | PRN

## 2011-07-20 NOTE — Telephone Encounter (Signed)
  Follow up Call-  Call back number 07/19/2011  Post procedure Call Back phone  # cell [phone]  Permission to leave phone message Yes     Patient questions:  Do you have a fever, pain , or abdominal swelling? no Pain Score  0 *  Have you tolerated food without any problems? yes  Have you been able to return to your normal activities? yes  Do you have any questions about your discharge instructions: Diet   no Medications  no Follow up visit  no  Do you have questions or concerns about your Care? no  Actions: * If pain score is 4 or above: No action needed, pain <4.

## 2011-07-21 ENCOUNTER — Telehealth: Payer: Self-pay | Admitting: Gastroenterology

## 2011-07-21 ENCOUNTER — Ambulatory Visit: Payer: 59 | Admitting: Cardiology

## 2011-07-21 MED ORDER — SUCRALFATE 1 GM/10ML PO SUSP: ORAL | Status: DC

## 2011-07-21 NOTE — Telephone Encounter (Signed)
Patient has continued hematemesis.  She denies chest pain or other symptoms.  She has had two vomiting episodes today one with blood and one without.  She was seen in the office by Nicoletta Ba PA 07/15/11 for the same symptoms.  Discussed with Dr Fuller Plan.  She is advised that she needs to continue Dexilant q am and ranitidine 300 mg q HS, alternate phenergan and zofran for nausea and vomiting.  Patient instructed to maintain an anti-reflux diet. Advised to avoid caffeine, mint, citrus foods/juices, tomatoes,  chocolate, NSAIDS/ASA products.  Instructed not to eat within 2 hours of exercise or bed, multiple small meals are better than 3 large meals.  Need to take PPI 30 minutes prior to 1st meal of the [place]. Per Dr Fuller Plan she needs to start carafate 1 gm qid.  She will call back for further questions or pain.

## 2011-08-02 ENCOUNTER — Ambulatory Visit (INDEPENDENT_AMBULATORY_CARE_PROVIDER_SITE_OTHER): Payer: 59 | Admitting: Licensed Clinical Social Worker

## 2011-08-02 DIAGNOSIS — F331 Major depressive disorder, recurrent, moderate: Secondary | ICD-10-CM

## 2011-08-03 ENCOUNTER — Other Ambulatory Visit: Payer: Self-pay | Admitting: *Deleted

## 2011-08-03 MED ORDER — TRAMADOL HCL 50 MG PO TABS: 50.0000 mg | ORAL_TABLET | Freq: Three times a day (TID) | ORAL | Status: DC | PRN

## 2011-08-03 MED ORDER — PROMETHAZINE HCL 25 MG PO TABS: 25.0000 mg | ORAL_TABLET | Freq: Four times a day (QID) | ORAL | Status: DC | PRN

## 2011-08-03 MED ORDER — METOCLOPRAMIDE HCL 5 MG PO TABS: 5.0000 mg | ORAL_TABLET | Freq: Four times a day (QID) | ORAL | Status: DC

## 2011-08-10 ENCOUNTER — Other Ambulatory Visit: Payer: Self-pay | Admitting: Physician Assistant

## 2011-08-12 NOTE — Telephone Encounter (Signed)
Sent to patient's Gastro MD for next refill.

## 2011-08-18 ENCOUNTER — Other Ambulatory Visit: Payer: Self-pay | Admitting: *Deleted

## 2011-08-18 MED ORDER — HYDROCHLOROTHIAZIDE 25 MG PO TABS: 25.0000 mg | ORAL_TABLET | Freq: Every day | ORAL | Status: DC

## 2011-08-24 ENCOUNTER — Encounter (HOSPITAL_COMMUNITY): Payer: Self-pay | Admitting: Emergency Medicine

## 2011-08-24 ENCOUNTER — Emergency Department (HOSPITAL_COMMUNITY): Payer: Medicare Other

## 2011-08-24 ENCOUNTER — Observation Stay (HOSPITAL_COMMUNITY)
Admission: EM | Admit: 2011-08-24 | Discharge: 2011-08-25 | DRG: 313 | Disposition: A | Discharge status: Home / Self Care | Payer: Medicare Other | Attending: Cardiology | Admitting: Cardiology

## 2011-08-24 ENCOUNTER — Ambulatory Visit (INDEPENDENT_AMBULATORY_CARE_PROVIDER_SITE_OTHER): Payer: Medicare Other | Admitting: Gastroenterology

## 2011-08-24 ENCOUNTER — Encounter: Payer: Self-pay | Admitting: Gastroenterology

## 2011-08-24 VITALS — BP 100/80 | HR 88 | Ht 63.0 in | Wt 179.1 lb

## 2011-08-24 DIAGNOSIS — F32A Depression, unspecified: Secondary | ICD-10-CM | POA: Insufficient documentation

## 2011-08-24 DIAGNOSIS — G894 Chronic pain syndrome: Secondary | ICD-10-CM | POA: Insufficient documentation

## 2011-08-24 DIAGNOSIS — E785 Hyperlipidemia, unspecified: Secondary | ICD-10-CM | POA: Insufficient documentation

## 2011-08-24 DIAGNOSIS — Z9861 Coronary angioplasty status: Secondary | ICD-10-CM

## 2011-08-24 DIAGNOSIS — D509 Iron deficiency anemia, unspecified: Secondary | ICD-10-CM

## 2011-08-24 DIAGNOSIS — Z79899 Other long term (current) drug therapy: Secondary | ICD-10-CM

## 2011-08-24 DIAGNOSIS — E876 Hypokalemia: Secondary | ICD-10-CM

## 2011-08-24 DIAGNOSIS — I252 Old myocardial infarction: Secondary | ICD-10-CM

## 2011-08-24 DIAGNOSIS — K5900 Constipation, unspecified: Secondary | ICD-10-CM

## 2011-08-24 DIAGNOSIS — R0789 Other chest pain: Secondary | ICD-10-CM

## 2011-08-24 DIAGNOSIS — K219 Gastro-esophageal reflux disease without esophagitis: Secondary | ICD-10-CM | POA: Insufficient documentation

## 2011-08-24 DIAGNOSIS — F329 Major depressive disorder, single episode, unspecified: Secondary | ICD-10-CM | POA: Insufficient documentation

## 2011-08-24 DIAGNOSIS — F341 Dysthymic disorder: Secondary | ICD-10-CM | POA: Diagnosis present

## 2011-08-24 DIAGNOSIS — E7800 Pure hypercholesterolemia, unspecified: Secondary | ICD-10-CM | POA: Insufficient documentation

## 2011-08-24 DIAGNOSIS — I10 Essential (primary) hypertension: Secondary | ICD-10-CM | POA: Insufficient documentation

## 2011-08-24 DIAGNOSIS — F411 Generalized anxiety disorder: Secondary | ICD-10-CM | POA: Insufficient documentation

## 2011-08-24 DIAGNOSIS — Z7982 Long term (current) use of aspirin: Secondary | ICD-10-CM

## 2011-08-24 DIAGNOSIS — Z87891 Personal history of nicotine dependence: Secondary | ICD-10-CM

## 2011-08-24 DIAGNOSIS — I2510 Atherosclerotic heart disease of native coronary artery without angina pectoris: Secondary | ICD-10-CM | POA: Insufficient documentation

## 2011-08-24 DIAGNOSIS — R079 Chest pain, unspecified: Principal | ICD-10-CM | POA: Insufficient documentation

## 2011-08-24 DIAGNOSIS — Z8249 Family history of ischemic heart disease and other diseases of the circulatory system: Secondary | ICD-10-CM

## 2011-08-24 DIAGNOSIS — M797 Fibromyalgia: Secondary | ICD-10-CM | POA: Insufficient documentation

## 2011-08-24 DIAGNOSIS — Z888 Allergy status to other drugs, medicaments and biological substances status: Secondary | ICD-10-CM

## 2011-08-24 DIAGNOSIS — K2100 Gastro-esophageal reflux disease with esophagitis, without bleeding: Secondary | ICD-10-CM

## 2011-08-24 LAB — LIPASE, BLOOD: Lipase: 19 U/L (ref 11–59)

## 2011-08-24 LAB — COMPREHENSIVE METABOLIC PANEL
ALT: 14 U/L (ref 0–35)
Alkaline Phosphatase: 99 U/L (ref 39–117)
BUN: 9 mg/dL (ref 6–23)
CO2: 28 mEq/L (ref 19–32)
Chloride: 99 mEq/L (ref 96–112)
GFR calc Af Amer: 62 mL/min — ABNORMAL LOW (ref >90)
Glucose, Bld: 86 mg/dL (ref 70–99)
Potassium: 3 mEq/L — ABNORMAL LOW (ref 3.5–5.1)
Total Bilirubin: 0.2 mg/dL — ABNORMAL LOW (ref 0.3–1.2)

## 2011-08-24 LAB — CBC
HCT: 36.3 % (ref 36.0–46.0)
Hemoglobin: 11.4 g/dL — ABNORMAL LOW (ref 12.0–15.0)
RBC: 4.31 MIL/uL (ref 3.87–5.11)
WBC: 9.2 10*3/uL (ref 4.0–10.5)

## 2011-08-24 MED ORDER — MORPHINE SULFATE 4 MG/ML IJ SOLN: 4.0000 mg | Freq: Once | INTRAMUSCULAR | Status: DC

## 2011-08-24 MED ORDER — ESOMEPRAZOLE MAGNESIUM 40 MG PO CPDR: 40.0000 mg | DELAYED_RELEASE_CAPSULE | Freq: Two times a day (BID) | ORAL | Status: DC

## 2011-08-24 MED ORDER — POTASSIUM CHLORIDE CRYS ER 20 MEQ PO TBCR
40.0000 meq | EXTENDED_RELEASE_TABLET | Freq: Once | ORAL | Status: AC
  Administered 2011-08-24: 40 meq via ORAL
  Filled 2011-08-24: qty 2

## 2011-08-24 MED ORDER — MORPHINE SULFATE 4 MG/ML IJ SOLN
4.0000 mg | Freq: Once | INTRAMUSCULAR | Status: AC
  Administered 2011-08-24: 4 mg via INTRAVENOUS
  Filled 2011-08-24: qty 1

## 2011-08-24 MED ORDER — POLYETHYLENE GLYCOL 3350 17 GM/SCOOP PO POWD: ORAL | Status: DC

## 2011-08-24 MED ORDER — METOCLOPRAMIDE HCL 10 MG PO TABS: 10.0000 mg | ORAL_TABLET | Freq: Three times a day (TID) | ORAL | Status: DC

## 2011-08-24 MED ORDER — ASPIRIN 81 MG PO CHEW: 324.0000 mg | CHEWABLE_TABLET | Freq: Once | ORAL | Status: DC

## 2011-08-24 NOTE — H&P (Signed)
Cardiology History and Physical  Gwendolyn Grant, MD, MD  History of Present Illness (and review of medical records): Hayley Miller is a 51 y.o. female who presents for evaluation of chest pain.  She has hx of CAD with PCI to LAD.  She presents with chest pain that began around 5pm.  Pain was 10/10 and sharp.  Pain was associated with nausea.  She took NTG x 2 at home with no relief and ASA.  She then called EMS.  EMS gave additional ASA and NTG en route to ED.  Pain is now 0/10.  She states she had one episode one month prior at home that was relieved with NTG.  Review of Systems Further review of systems was otherwise negative other than stated in HPI.  Patient Active Problem List  Diagnoses Date Noted  . Chest pain 08/24/2011  . Nausea & vomiting   . WEIGHT GAIN 11/13/2009  . FIBROMYALGIA 04/18/2009  . MYOCARDIAL INFARCTION 04/15/2009  . CHRONIC PAIN SYNDROME 04/03/2009  . HEADACHE 10/07/2008  . LOW BACK PAIN 01/12/2008  . DEPRESSION 07/27/2007  . HYPERLIPIDEMIA 06/27/2007  . ANXIETY 06/27/2007  . HYPERTENSION 06/27/2007  . CORONARY ARTERY DISEASE 06/27/2007  . GERD 06/27/2007   Past Medical History  Diagnosis Date  . HTN (hypertension)   . HLD (hyperlipidemia)   . CAD (coronary artery disease) 11/2004    s/p PCI 9/06 due to ant MI  . Chronic pain syndrome   . Hemorrhage     upper gastrointestional. GI bleed 10/09 due to NSAID  . Depression   . GERD (gastroesophageal reflux disease)   . Anxiety   . Fibromyalgia   . Rhinitis   . Myocardial infarction     x2  . Allergy   . Anemia   . Erosive esophagitis   . Cameron lesion, acute   . Hiatal hernia     Past Surgical History  Procedure Date  . L breast - cyst removed 2002  . Cardiac catheterization   . Upper gastrointestinal endoscopy      (Not in a hospital admission) Allergies  Allergen Reactions  . Clonidine Hydrochloride     REACTION: FATIGUE  . Ibuprofen     REACTION: GI bleed  . Lovastatin    REACTION: pains    History  Substance Use Topics  . Smoking status: Former Research scientist (life sciences)  . Smokeless tobacco: Never Used   Comment: Divorced, lives with female domestic partner  . Alcohol Use: Yes     occasional    Family History  Problem Relation Age of Onset  . Hypertension Mother   . Heart murmur Mother   . Colon polyps Mother   . Colon cancer Maternal Aunt   . Heart disease Son   . Heart disease Maternal Grandmother   . Diabetes Maternal Aunt   . Esophageal cancer Neg Hx   . Rectal cancer Neg Hx   . Stomach cancer Neg Hx      Objective: Patient Vitals for the past 8 hrs:  BP Temp Temp src Pulse Resp SpO2  08/24/11 2142 142/93 mmHg - - - 16  97 %  08/24/11 2100 126/102 mmHg - - 89  16  97 %  08/24/11 2015 122/97 mmHg - - - - 97 %  08/24/11 1945 127/88 mmHg - - 82  19  99 %  08/24/11 1937 131/88 mmHg - - - 18  97 %  08/24/11 1915 119/90 mmHg - - 82  18  97 %  08/24/11  1811 119/93 mmHg 98.4 F (36.9 C) Oral 88  18  98 %   General Appearance:    Alert, cooperative, no distress, appears stated age  Head:    Normocephalic, without obvious abnormality, atraumatic  Eyes:     PERRL, EOMI, anicteric sclerae  Neck:   Supple, no carotid bruit or JVD  Lungs:     Clear to auscultation bilaterally, respirations unlabored  Heart:    Regular rate and rhythm, S1 and S2 normal, no murmur  Abdomen:     Soft, non-tender, normoactive bowel sounds  Extremities:   Extremities normal, atraumatic, no cyanosis or edema  Pulses:   2+ and symmetric all extremities  Skin:   no rashes or lesions  Neurologic:   No focal deficits. AAO x3   Results for orders placed during the hospital encounter of 08/24/11 (from the past 48 hour(s))  CBC     Status: Abnormal   Collection Time   08/24/11  7:00 PM      Component Value Range Comment   WBC 9.2  4.0 - 10.5 (K/uL)    RBC 4.31  3.87 - 5.11 (MIL/uL)    Hemoglobin 11.4 (*) 12.0 - 15.0 (g/dL)    HCT 36.3  36.0 - 46.0 (%)    MCV 84.2  78.0 - 100.0 (fL)     MCH 26.5  26.0 - 34.0 (pg)    MCHC 31.4  30.0 - 36.0 (g/dL)    RDW 22.4 (*) 11.5 - 15.5 (%)    Platelets 454 (*) 150 - 400 (K/uL)   COMPREHENSIVE METABOLIC PANEL     Status: Abnormal   Collection Time   08/24/11  7:00 PM      Component Value Range Comment   Sodium 137  135 - 145 (mEq/L)    Potassium 3.0 (*) 3.5 - 5.1 (mEq/L)    Chloride 99  96 - 112 (mEq/L)    CO2 28  19 - 32 (mEq/L)    Glucose, Bld 86  70 - 99 (mg/dL)    BUN 9  6 - 23 (mg/dL)    Creatinine, Ser 1.17 (*) 0.50 - 1.10 (mg/dL)    Calcium 10.2  8.4 - 10.5 (mg/dL)    Total Protein 7.3  6.0 - 8.3 (g/dL)    Albumin 3.9  3.5 - 5.2 (g/dL)    AST 18  0 - 37 (U/L)    ALT 14  0 - 35 (U/L)    Alkaline Phosphatase 99  39 - 117 (U/L)    Total Bilirubin 0.2 (*) 0.3 - 1.2 (mg/dL)    GFR calc non Af Amer 54 (*) >90 (mL/min)    GFR calc Af Amer 62 (*) >90 (mL/min)   LIPASE, BLOOD     Status: Normal   Collection Time   08/24/11  7:00 PM      Component Value Range Comment   Lipase 19  11 - 59 (U/L)   POCT I-STAT TROPONIN I     Status: Normal   Collection Time   08/24/11  7:20 PM      Component Value Range Comment   Troponin i, poc 0.00  0.00 - 0.08 (ng/mL)    Comment 3             Dg Chest 2 View  08/24/2011  *RADIOLOGY REPORT*  Clinical Data: 51 year old female with chest pain radiating from the chest under the left breast to the back with shortness of breath.  CHEST - 2 VIEW  Comparison:  12/21/2009 and earlier.  Findings: Chronic bilateral pleural effusions or pleural scarring has not significantly changed since 2011.  No pneumothorax, pulmonary edema or new pulmonary opacity.  Cardiac size and mediastinal contours are within normal limits.  Visualized tracheal air column is within normal limits.  No acute osseous abnormality identified.  IMPRESSION: Chronic small pleural effusions and/or pleural scarring without change since 2011.  No new cardiopulmonary abnormality.  Original Report Authenticated By: Randall An, M.D.     Assessment: Chest pain  Plan:  1. Admit to Cardiology, Telemetry Unit 2. Repeat ekg on admit, prn chest pain or arrythmia 3. Trend cardiac biomarkers, check lipids, hgba1c, tsh 4. Medical management to include ASA, BB, Statin, NTG prn 5. NPO for ischemic evalaution

## 2011-08-24 NOTE — ED Notes (Signed)
Patient reports she is pain free on arrival to ED. Reports she was sitting at home and started having substernal chest pain and across upper abdomen, radiating to back. Pain exacerbated by palpation to mid chest.

## 2011-08-24 NOTE — ED Notes (Signed)
Cardiology at bedside.

## 2011-08-24 NOTE — Patient Instructions (Signed)
Increase taking your Dexilant one tablet by mouth twice daily until finished. Pick up your prescription for Nexium to take in it's place one tablet by mouth twice daily.  We have also sent a prescription to increase your Reglan to 10 mg tablets to take before each meal and at bedtime.  Start Miralax mixing 17 grams in 8 oz of water 1-2 x daily for constipation and this is over the counter. You have been given information on Reflux and Gastroparesis diet.  cc: Gwendolyn Grant, MD

## 2011-08-24 NOTE — ED Provider Notes (Signed)
History     CSN: [id number]  Arrival date & time 08/24/11  1800   First MD Initiated Contact with Patient 08/24/11 1844      Chief Complaint  Patient presents with  . Chest Pain    (Consider location/radiation/quality/duration/timing/severity/associated sxs/prior treatment) The history is provided by the patient.  51 y/o F with PMH HTN, HLD, CAD, chronic pain, GERD, MI presents to ED with c/c of sudden onset stabbing substernal CP that began at 4pm after an episode of emesis. Pain rad under left breast and occas to back. Assoc with nausea, SOB. No fever, diaphoresis. Worse with coughing, some movements. Pain described as anginal equivalent, different from GERD pain. Pt took NTG without relief. Given ASA by EMS. CP improved spontaneously prior to ED arrival, pt now c/o only sig HA but no visual changes, neck stiffness.  Has seen [facility] with cards in past, has appt set up for Friday for eval of recent episodes of CP. Saw GI this am with reflux medication adjustment but was otherwise feeling at her baseline at that time. Fam hx sig for CAD. No recent prolonged trip, no recent leg swelling/pain, no hx PE/DVT.  Past Medical History  Diagnosis Date  . HTN (hypertension)   . HLD (hyperlipidemia)   . CAD (coronary artery disease) 11/2004    s/p PCI 9/06 due to ant MI  . Chronic pain syndrome   . Hemorrhage     upper gastrointestional. GI bleed 10/09 due to NSAID  . Depression   . GERD (gastroesophageal reflux disease)   . Anxiety   . Fibromyalgia   . Rhinitis   . Myocardial infarction     x2  . Allergy   . Anemia   . Erosive esophagitis   . Cameron lesion, acute   . Hiatal hernia     Past Surgical History  Procedure Date  . L breast - cyst removed 2002  . Cardiac catheterization   . Upper gastrointestinal endoscopy     Family History  Problem Relation Age of Onset  . Hypertension Mother   . Heart murmur Mother   . Colon polyps Mother   . Colon cancer Maternal Aunt   .  Heart disease Son   . Heart disease Maternal Grandmother   . Diabetes Maternal Aunt   . Esophageal cancer Neg Hx   . Rectal cancer Neg Hx   . Stomach cancer Neg Hx     History  Substance Use Topics  . Smoking status: Former Research scientist (life sciences)  . Smokeless tobacco: Never Used   Comment: Divorced, lives with female domestic partner  . Alcohol Use: Yes     occasional      Review of Systems 10 systems reviewed and are negative for acute change except as noted in the HPI.  Allergies  Clonidine hydrochloride; Ibuprofen; and Lovastatin  Home Medications   Current Outpatient Rx  Name Route Sig Dispense Refill  . AMITRIPTYLINE HCL 100 MG PO TABS Oral Take 1 tablet (100 mg total) by mouth at bedtime. 30 tablet 11  . ASPIRIN 81 MG PO TBEC Oral Take 81 mg by mouth daily.      [name] CITALOPRAM HYDROBROMIDE 40 MG PO TABS Oral Take 1 tablet (40 mg total) by mouth daily. 30 tablet 11  . CLONAZEPAM 1 MG PO TABS Oral Take 1 tablet (1 mg total) by mouth 2 (two) times daily as needed for anxiety. 60 tablet 1  . ESOMEPRAZOLE MAGNESIUM 40 MG PO CPDR Oral Take 1 capsule (40  mg total) by mouth 2 (two) times daily. 60 capsule 11  . INTEGRA PLUS PO CAPS  Take one po daily 30 capsule 3  . GEMFIBROZIL 600 MG PO TABS Oral Take 1 tablet (600 mg total) by mouth 2 (two) times daily. 60 tablet 11  . HYDROCHLOROTHIAZIDE 25 MG PO TABS Oral Take 1 tablet (25 mg total) by mouth daily. 30 tablet 5  . LABETALOL HCL 200 MG PO TABS Oral Take 2 tablets (400 mg total) by mouth 2 (two) times daily. 120 tablet 5    Yearly physical is due in October must see md b4 a ...  . LORATADINE-PSEUDOEPHEDRINE ER 5-120 MG PO TB12 Oral Take 1 tablet by mouth daily as needed. For allergies    . METOCLOPRAMIDE HCL 10 MG PO TABS Oral Take 1 tablet (10 mg total) by mouth 4 (four) times daily -  before meals and at bedtime. 120 tablet 5  . MOMETASONE FUROATE 50 MCG/ACT NA SUSP Nasal Place 2 sprays into the nose at bedtime. 17 g 1  . NITROGLYCERIN 0.4  MG SL SUBL Sublingual Place 1 tablet (0.4 mg total) under the tongue every 5 (five) minutes as needed for chest pain. 25 tablet 3  . POLYETHYLENE GLYCOL 3350 PO POWD  Mix 17 grams in 8 oz of water 1-2 x daily 255 g 0  . PRAVASTATIN SODIUM 40 MG PO TABS Oral Take 1 tablet (40 mg total) by mouth at bedtime. 30 tablet 11  . PROMETHAZINE HCL 25 MG PO TABS Oral Take 25 mg by mouth every 6 (six) hours as needed. For nausea    . RANITIDINE HCL 150 MG PO CAPS Oral Take 1 capsule (150 mg total) by mouth 2 (two) times daily. 60 capsule 5  . SUCRALFATE 1 GM/10ML PO SUSP Oral Take 1 g by mouth 2 (two) times daily.    . TRAMADOL HCL 50 MG PO TABS Oral Take 50 mg by mouth every 8 (eight) hours as needed. For pain      BP 141/95  Pulse 87  Temp(Src) 98.4 F (36.9 C) (Oral)  Resp 22  SpO2 99%  LMP 07/28/2011  Physical Exam  Constitutional: She is oriented to person, place, and time. She appears well-developed and well-nourished. No distress.       Vital signs are reviewed and are normal.   HENT:  Head: Normocephalic and atraumatic.  Right Ear: External ear normal.  Left Ear: External ear normal.       Poor dentition. MMM.  Eyes: Pupils are equal, round, and reactive to light.  Neck: Neck supple.  Cardiovascular: Normal rate, regular rhythm and normal heart sounds.        Bilateral radial and DP pulses are 2+   Pulmonary/Chest: Effort normal and breath sounds normal. No respiratory distress. She has no wheezes. She exhibits no tenderness.  Abdominal: Soft. Bowel sounds are normal. She exhibits no distension. There is no tenderness. There is no rebound and no guarding.  Musculoskeletal:       Calves supple and non-tender  Neurological: She is alert and oriented to person, place, and time. Cranial nerve deficit: 3-12 intact. Abnormal coordination: F-N intact bilaterally.       MS appears baseline for pt and situation  Skin: Skin is warm and dry.  Psychiatric:       Anxious-appearing    ED  Course  Procedures (including critical care time)  Labs Reviewed  CBC - Abnormal; Notable for the following:    Hemoglobin  11.4 (*)    RDW 22.4 (*)    Platelets 454 (*)    All other components within normal limits  COMPREHENSIVE METABOLIC PANEL - Abnormal; Notable for the following:    Potassium 3.0 (*)    Creatinine, Ser 1.17 (*)    Total Bilirubin 0.2 (*)    GFR calc non Af Amer 54 (*)    GFR calc Af Amer 62 (*)    All other components within normal limits  LIPASE, BLOOD  POCT I-STAT TROPONIN I   Dg Chest 2 View  08/24/2011  *RADIOLOGY REPORT*  Clinical Data: 51 year old female with chest pain radiating from the chest under the left breast to the back with shortness of breath.  CHEST - 2 VIEW  Comparison: 12/21/2009 and earlier.  Findings: Chronic bilateral pleural effusions or pleural scarring has not significantly changed since 2011.  No pneumothorax, pulmonary edema or new pulmonary opacity.  Cardiac size and mediastinal contours are within normal limits.  Visualized tracheal air column is within normal limits.  No acute osseous abnormality identified.  IMPRESSION: Chronic small pleural effusions and/or pleural scarring without change since 2011.  No new cardiopulmonary abnormality.  Original Report Authenticated By: Randall An, M.D.    Date: 08/25/2011  Rate: 87  Rhythm: sinus  QRS Axis: left  Intervals: QT prolonged  ST/T Wave abnormalities: nonspecific T wave changes  Conduction Disutrbances:none  Narrative Interpretation: compared to prior EKG dated 06/24/2009, new TWI in leads I, aVL, V2, V3, V4  Old EKG Reviewed: changes noted    Dx 1: CP Dx 2: hypokalemia   MDM  Anginal-equivalent CP with negative troponin, + EKG changes. Unchanged CXR. Labs otherwise sig for hypokalemia. Given pt hx and EKG changes, admission to hospital for further eval is warranted. [place] cardiology consulted, Dr Colon Flattery to see in ED. No CP recurrence in ED, HA tx with  morphine.        Orlie Dakin, [place] 08/25/11 0122

## 2011-08-24 NOTE — Progress Notes (Signed)
History of Present Illness: This is a 51 year old female who had iron deficiency anemia, nausea, vomiting and hematemesis. Upper endoscopy showed erosive esophagitis which was friable, and a hiatal hernia with Lysbeth Galas erosions. Her symptoms are not under good control on Dexilant 60 mg every morning and ranitidine 300 mg every evening. She has persistent heartburn, N/V and occasional streaks of blood with vomiting. She relates frequent nosebleeds. She states she generally has nausea and vomiting following her evening meal. She is not following a reflux diet very closely.  She notes mild constipation which she relates to medications.  Current Medications, Allergies, Past Medical History, Past Surgical History, Family History and Social History were reviewed in [employer] record.  Physical Exam: General: Well developed , well nourished, no acute distress Head: Normocephalic and atraumatic Eyes:  sclerae anicteric, EOMI Ears: Normal auditory acuity Mouth: No deformity or lesions Lungs: Clear throughout to auscultation Heart: Regular rate and rhythm; no murmurs, rubs or bruits Abdomen: Soft, non tender and non distended. No masses, hepatosplenomegaly or hernias noted. Normal Bowel sounds Musculoskeletal: Symmetrical with no gross deformities  Pulses:  Normal pulses noted Extremities: No clubbing, cyanosis, edema or deformities noted Neurological: Alert oriented x 4, grossly nonfocal Psychological:  Alert and cooperative. Normal mood and affect  Assessment and Recommendations:  1. Iron deficiency anemia. Erosive esophagitis and Cameron erosions on recent endoscopy. Persistent heartburn, nausea and vomiting.  I suspect she has underlying gastroparesis. She needs to follow a strict antireflux and gastroparesis diet. Change to Nexium 40 mg twice a Ligman. Increase metoclopramide 10 mg a.c. and at bedtime. She will need a colonoscopy to complete the evaluation of iron deficiency  anemia when her upper gastrointestinal symptoms are under better control.  2. Mild constipation. Begin MiraLax once or twice daily titrated for adequate bowel movements.

## 2011-08-24 NOTE — ED Notes (Signed)
Patient called EMS for substernal chest pain, neg diaphoresis, mild SOB,  onset was 1 hour PTA. No changes in pain with 3 SL nitro admin. 324mg  ASA by EMs. 20g L hand. NSR on 12 lead for EMS. Hx of MI in 2006 x2.

## 2011-08-24 NOTE — ED Notes (Signed)
Pt reports decrease in headache, states "its gone". Plan of care is updated with verbal understanding, pt is awaiting bed assignment for admission stay. Will continue to monitor pt.

## 2011-08-24 NOTE — ED Notes (Signed)
Pt returned from testing and placed back on bedside cardiac monitor, pt denies any pain, shortness of breath or any other complaints at this time. Plan of care is updated with verbal understanding and will continue to monitor pt.

## 2011-08-25 ENCOUNTER — Inpatient Hospital Stay (HOSPITAL_COMMUNITY): Payer: Medicare Other

## 2011-08-25 DIAGNOSIS — R0789 Other chest pain: Secondary | ICD-10-CM

## 2011-08-25 DIAGNOSIS — R079 Chest pain, unspecified: Secondary | ICD-10-CM

## 2011-08-25 LAB — HEMOGLOBIN A1C
Hgb A1c MFr Bld: 4.7 % (ref <5.7)
Mean Plasma Glucose: 88 mg/dL (ref <117)

## 2011-08-25 LAB — CARDIAC PANEL(CRET KIN+CKTOT+MB+TROPI)
CK, MB: 1.7 ng/mL (ref 0.3–4.0)
Relative Index: 1.1 (ref 0.0–2.5)
Relative Index: 1.1 (ref 0.0–2.5)
Total CK: 160 U/L (ref 7–177)
Total CK: 151 U/L (ref 7–177)

## 2011-08-25 LAB — LIPID PANEL
Cholesterol: 151 mg/dL (ref 0–200)
HDL: 55 mg/dL (ref >39)
Total CHOL/HDL Ratio: 2.7 RATIO

## 2011-08-25 MED ORDER — SODIUM CHLORIDE 0.9 % IJ SOLN
3.0000 mL | Freq: Two times a day (BID) | INTRAMUSCULAR | Status: DC
  Administered 2011-08-25 (×2): 3 mL via INTRAVENOUS

## 2011-08-25 MED ORDER — PROMETHAZINE HCL 25 MG PO TABS: 25.0000 mg | ORAL_TABLET | Freq: Four times a day (QID) | ORAL | Status: DC | PRN

## 2011-08-25 MED ORDER — AMITRIPTYLINE HCL 100 MG PO TABS
100.0000 mg | ORAL_TABLET | Freq: Every day | ORAL | Status: DC
  Filled 2011-08-25 (×2): qty 1

## 2011-08-25 MED ORDER — LORATADINE-PSEUDOEPHEDRINE ER 5-120 MG PO TB12: 1.0000 | ORAL_TABLET | Freq: Every day | ORAL | Status: DC | PRN

## 2011-08-25 MED ORDER — TECHNETIUM TC 99M TETROFOSMIN IV KIT
10.0000 | PACK | Freq: Once | INTRAVENOUS | Status: AC | PRN
  Administered 2011-08-25: 10 via INTRAVENOUS

## 2011-08-25 MED ORDER — REGADENOSON 0.4 MG/5ML IV SOLN
INTRAVENOUS | Status: AC | PRN
  Administered 2011-08-25: 0.4 mg via INTRAVENOUS

## 2011-08-25 MED ORDER — CITALOPRAM HYDROBROMIDE 40 MG PO TABS
40.0000 mg | ORAL_TABLET | Freq: Every day | ORAL | Status: DC
  Administered 2011-08-25: 40 mg via ORAL
  Filled 2011-08-25: qty 1

## 2011-08-25 MED ORDER — PSEUDOEPHEDRINE HCL ER 120 MG PO TB12
120.0000 mg | ORAL_TABLET | Freq: Every day | ORAL | Status: DC | PRN
  Filled 2011-08-25: qty 1

## 2011-08-25 MED ORDER — TECHNETIUM TC 99M TETROFOSMIN IV KIT
30.0000 | PACK | Freq: Once | INTRAVENOUS | Status: AC | PRN
  Administered 2011-08-25: 30 via INTRAVENOUS

## 2011-08-25 MED ORDER — METOCLOPRAMIDE HCL 10 MG PO TABS
10.0000 mg | ORAL_TABLET | Freq: Three times a day (TID) | ORAL | Status: DC
  Administered 2011-08-25 (×2): 10 mg via ORAL
  Filled 2011-08-25 (×5): qty 1

## 2011-08-25 MED ORDER — CLONAZEPAM 1 MG PO TABS: 1.0000 mg | ORAL_TABLET | Freq: Two times a day (BID) | ORAL | Status: DC | PRN

## 2011-08-25 MED ORDER — AMITRIPTYLINE HCL 100 MG PO TABS
100.0000 mg | ORAL_TABLET | Freq: Once | ORAL | Status: AC
  Administered 2011-08-25: 100 mg via ORAL
  Filled 2011-08-25: qty 1

## 2011-08-25 MED ORDER — ASPIRIN 81 MG PO TBEC: 81.0000 mg | DELAYED_RELEASE_TABLET | Freq: Every day | ORAL | Status: DC

## 2011-08-25 MED ORDER — LORATADINE 10 MG PO TABS
10.0000 mg | ORAL_TABLET | Freq: Every day | ORAL | Status: DC | PRN
  Filled 2011-08-25: qty 1

## 2011-08-25 MED ORDER — NITROGLYCERIN 0.4 MG SL SUBL
0.4000 mg | SUBLINGUAL_TABLET | SUBLINGUAL | Status: DC | PRN
Start: 2011-08-25 — End: 2011-08-25

## 2011-08-25 MED ORDER — SIMVASTATIN 20 MG PO TABS
20.0000 mg | ORAL_TABLET | Freq: Every day | ORAL | Status: DC
  Filled 2011-08-25: qty 1

## 2011-08-25 MED ORDER — ENOXAPARIN SODIUM 40 MG/0.4ML ~~LOC~~ SOLN
40.0000 mg | SUBCUTANEOUS | Status: DC
  Administered 2011-08-25: 40 mg via SUBCUTANEOUS
  Filled 2011-08-25 (×2): qty 0.4

## 2011-08-25 MED ORDER — GEMFIBROZIL 600 MG PO TABS
600.0000 mg | ORAL_TABLET | Freq: Two times a day (BID) | ORAL | Status: DC
  Administered 2011-08-25: 600 mg via ORAL
  Filled 2011-08-25 (×3): qty 1

## 2011-08-25 MED ORDER — ASPIRIN EC 81 MG PO TBEC
81.0000 mg | DELAYED_RELEASE_TABLET | Freq: Every day | ORAL | Status: DC
  Administered 2011-08-25: 81 mg via ORAL
  Filled 2011-08-25: qty 1

## 2011-08-25 MED ORDER — SODIUM CHLORIDE 0.9 % IV SOLN
INTRAVENOUS | Status: AC
  Administered 2011-08-25 (×2): via INTRAVENOUS

## 2011-08-25 MED ORDER — FLUTICASONE PROPIONATE 50 MCG/ACT NA SUSP
2.0000 | Freq: Every day | NASAL | Status: DC
  Administered 2011-08-25: 2 via NASAL
  Filled 2011-08-25: qty 16

## 2011-08-25 MED ORDER — HYDROCHLOROTHIAZIDE 25 MG PO TABS
25.0000 mg | ORAL_TABLET | Freq: Every day | ORAL | Status: DC
  Administered 2011-08-25: 25 mg via ORAL
  Filled 2011-08-25: qty 1

## 2011-08-25 MED ORDER — SUCRALFATE 1 GM/10ML PO SUSP
1.0000 g | Freq: Two times a day (BID) | ORAL | Status: DC
  Administered 2011-08-25: 1 g via ORAL
  Filled 2011-08-25 (×4): qty 10

## 2011-08-25 MED ORDER — LABETALOL HCL 200 MG PO TABS
400.0000 mg | ORAL_TABLET | Freq: Two times a day (BID) | ORAL | Status: DC
  Administered 2011-08-25: 400 mg via ORAL
  Filled 2011-08-25 (×2): qty 2

## 2011-08-25 MED ORDER — DOCUSATE SODIUM 100 MG PO CAPS
100.0000 mg | ORAL_CAPSULE | Freq: Two times a day (BID) | ORAL | Status: DC
  Administered 2011-08-25: 100 mg via ORAL
  Filled 2011-08-25 (×2): qty 1

## 2011-08-25 MED ORDER — ATORVASTATIN CALCIUM 10 MG PO TABS
10.0000 mg | ORAL_TABLET | Freq: Every day | ORAL | Status: DC
  Filled 2011-08-25: qty 1

## 2011-08-25 MED ORDER — FAMOTIDINE 20 MG PO TABS
20.0000 mg | ORAL_TABLET | Freq: Two times a day (BID) | ORAL | Status: DC
  Administered 2011-08-25: 20 mg via ORAL
  Filled 2011-08-25 (×2): qty 1

## 2011-08-25 MED ORDER — TRAMADOL HCL 50 MG PO TABS
50.0000 mg | ORAL_TABLET | Freq: Three times a day (TID) | ORAL | Status: DC | PRN
  Administered 2011-08-25 (×2): 50 mg via ORAL
  Filled 2011-08-25 (×2): qty 1

## 2011-08-25 NOTE — ED Notes (Signed)
[facility] PA T.Arguello at bedside for start and throughout exam

## 2011-08-25 NOTE — ED Notes (Addendum)
Tread mill stress start.  Target heart rate 145

## 2011-08-25 NOTE — Discharge Summary (Signed)
See progress notes Hayley Miller  

## 2011-08-25 NOTE — ED Notes (Signed)
Pt to have exercise cardiac stress test per PA

## 2011-08-25 NOTE — ED Notes (Signed)
Test stopped per PA.  Pt states unable to tolerate ongoing tread mill exercise.  Changed exam to stress with Lexiscan

## 2011-08-25 NOTE — Progress Notes (Signed)
@   Subjective:  Denies CP or dyspnea   Objective:  Filed Vitals:   08/24/11 2342 08/25/11 0000 08/25/11 0045 08/25/11 0500  BP: 141/95 120/84 129/86 128/87  Pulse:  84 76 83  Temp:   98.7 F (37.1 C) 98.3 F (36.8 C)  TempSrc:   Oral Oral  Resp: 22 18 18 18   Height:   5\' 3"  (1.6 m)   Weight:   79.742 kg (175 lb 12.8 oz)   SpO2: 99% 95% 94% 95%    Physical Exam: Physical exam: Well-developed well-nourished in no acute distress.  Skin is warm and dry.  HEENT is normal.  Neck is supple.  Chest is clear to auscultation with normal expansion.  Cardiovascular exam is regular rate and rhythm.  Abdominal exam nontender or distended. No masses palpated. Extremities show no edema. neuro grossly intact    Lab Results: Basic Metabolic Panel:  Basename 08/24/11 1900  NA 137  K 3.0*  CL 99  CO2 28  GLUCOSE 86  BUN 9  CREATININE 1.17*  CALCIUM 10.2  MG --  PHOS --   CBC:  Basename 08/24/11 1900  WBC 9.2  NEUTROABS --  HGB 11.4*  HCT 36.3  MCV 84.2  PLT 454*   Cardiac Enzymes:  Basename 08/25/11 0520 08/25/11 0159  CKTOTAL 160 161  CKMB 1.7 1.8  CKMBINDEX -- --  TROPONINI <0.30 <0.30     Assessment/Plan:  1) Chest pain - Symptoms atypical; enzymes neg; plan myoview today; note last cath 8/11 with no obstructive CAD. 2) HTN - continue present BP meds 3) Hyperlipidemia - continue present meds 4) Fibromyalgia If myoview neg, DC today. >30 min PA and physician time D2  Kirk Ruths 08/25/2011, 7:28 AM

## 2011-08-25 NOTE — Discharge Instructions (Signed)
***  PLEASE REMEMBER TO BRING ALL OF YOUR MEDICATIONS TO EACH OF YOUR FOLLOW-UP OFFICE VISITS.  

## 2011-08-25 NOTE — ED Provider Notes (Signed)
Medical screening examination/treatment/procedure(s) were performed by non-physician practitioner and as supervising physician I was immediately available for consultation/collaboration.  Veryl Speak, MD 08/25/11 [phone]

## 2011-08-25 NOTE — Progress Notes (Signed)
Pt discharged to home with no further questions. Is aware that stress test was normal and is aware of follow up appointments. She knows to bring paper with her. She used nitro appropriately prior to calling 911 and knows when to call 911 or doctor again. Stable for DC. Lillia Mountain, RN

## 2011-08-25 NOTE — Discharge Summary (Signed)
Patient ID: Hayley Miller,  MRN: [id number], DOB/AGE: [birth year] 51 y.o.  Admit date: 08/24/2011 Discharge date: 08/25/2011  Primary Care Provider: Gwendolyn Grant, MD Primary Cardiologist: Lizbeth Bark, MD  Discharge Diagnoses Principal Problem:  *Midsternal chest pain  **Normal Myoview this admission Active Problems:  CORONARY ARTERY DISEASE  HYPERLIPIDEMIA  Chronic pain syndrome  HYPERTENSION  ANXIETY  DEPRESSION  GERD  FIBROMYALGIA  Allergies Allergies  Allergen Reactions  . Clonidine Hydrochloride     REACTION: FATIGUE  . Ibuprofen     REACTION: GI bleed  . Lovastatin     REACTION: pains    Procedures  08/25/2011 Lexiscan Myoview  IMPRESSION:  1. Normal ejection fraction of 63%. 2. No areas of fixed or reversible ischemia. _____________  History of Present Illness  51 y/o female with h/o CAD s/p PCI of the LAD.  She was in her USOH until 5pm on the evening of admission, when she developed sharp chest pain associated with nausea, which was not relieved by ntg and asa @ home.  She activated EMS and was treated with additional asa and ntg and taken to the [facility] ED.  There, ECG was non-acute and CE were negative.  She was admitted for further evaluation.  Hospital Course  Pt r/o for MI.  Chest pain resolved overnight.  She underwent lexiscan myoview this AM that showed no evidence of ischemia and normal LV function.  She is being discharged home today in good condition.  Discharge Vitals Blood pressure 124/88, pulse 99, temperature 99 F (37.2 C), temperature source Oral, resp. rate 18, height 5\' 3"  (1.6 m), weight 175 lb 12.8 oz (79.742 kg), last menstrual period 07/28/2011, SpO2 97.00%.  Filed Weights   08/25/11 0045  Weight: 175 lb 12.8 oz (79.742 kg)   Labs  CBC  Basename 08/24/11 1900  WBC 9.2  NEUTROABS --  HGB 11.4*  HCT 36.3  MCV 84.2  PLT [id number]*   Basic Metabolic Panel  Basename 0000000 1900  NA 137  K 3.0*  CL 99  CO2 28  GLUCOSE 86  BUN 9   CREATININE 1.17*  CALCIUM 10.2  MG --  PHOS --   Liver Function Tests  Basename 08/24/11 1900  AST 18  ALT 14  ALKPHOS 99  BILITOT 0.2*  PROT 7.3  ALBUMIN 3.9    Basename 08/24/11 1900  LIPASE 19  AMYLASE --   Cardiac Enzymes  Basename 08/25/11 0915 08/25/11 0520 08/25/11 0159  CKTOTAL 151 160 161  CKMB 1.7 1.7 1.8  CKMBINDEX -- -- --  TROPONINI <0.30 <0.30 <0.30   Fasting Lipid Panel  Basename 08/25/11 0520  CHOL 151  HDL 55  LDLCALC 75  TRIG 105  CHOLHDL 2.7  LDLDIRECT --   Disposition  Pt is being discharged home today in good condition.  Follow-up Plans & Appointments  Follow-up Information    Follow up with Gwendolyn Grant, MD on 10/07/2011. (10:15 AM)    Contact information:   [address] [facility] [address] [place] [phone]       Follow up with Dorris Carnes, MD on 10/11/2011. (10:15 AM)    Contact information:   [address] [place] [phone]       Follow up with Pricilla Riffle. Fuller Plan, MD,FACG on 09/24/2011. (2:00 PM)    Contact information:   [address] [facility] [place]  [phone]         Discharge Medications  Medication List  As of 08/25/2011  4:18 PM   STOP taking these medications         CLARITIN-D 12 HOUR 5-120 MG per tablet         TAKE these medications         amitriptyline 100 MG tablet   Commonly known as: ELAVIL   Take 1 tablet (100 mg total) by mouth at bedtime.      aspirin 81 MG EC tablet   Take 81 mg by mouth daily.      citalopram 40 MG tablet   Commonly known as: CELEXA   Take 1 tablet (40 mg total) by mouth daily.      clonazePAM 1 MG tablet   Commonly known as: KLONOPIN   Take 1 tablet (1 mg total) by mouth 2 (two) times daily as needed for anxiety.      esomeprazole 40 MG capsule   Commonly known as: NEXIUM   Take 1 capsule (40 mg total) by mouth 2  (two) times daily.      gemfibrozil 600 MG tablet   Commonly known as: LOPID   Take 1 tablet (600 mg total) by mouth 2 (two) times daily.      hydrochlorothiazide 25 MG tablet   Commonly known as: HYDRODIURIL   Take 1 tablet (25 mg total) by mouth daily.      INTEGRA PLUS Caps   Take one po daily      labetalol 200 MG tablet   Commonly known as: NORMODYNE   Take 2 tablets (400 mg total) by mouth 2 (two) times daily.      metoCLOPramide 10 MG tablet   Commonly known as: REGLAN   Take 1 tablet (10 mg total) by mouth 4 (four) times daily -  before meals and at bedtime.      mometasone 50 MCG/ACT nasal spray   Commonly known as: NASONEX   Place 2 sprays into the nose at bedtime.      nitroGLYCERIN 0.4 MG SL tablet   Commonly known as: NITROSTAT   Place 1 tablet (0.4 mg total) under the tongue every 5 (five) minutes as needed for chest pain.      polyethylene glycol powder powder   Commonly known as: GLYCOLAX/MIRALAX   Mix 17 grams in 8 oz of water 1-2 x daily      pravastatin 40 MG tablet   Commonly known as: PRAVACHOL   Take 1 tablet (40 mg total) by mouth at bedtime.      promethazine 25 MG tablet   Commonly known as: PHENERGAN   Take 25 mg by mouth every 6 (six) hours as needed. For nausea      ranitidine 150 MG capsule   Commonly known as: ZANTAC   Take 1 capsule (150 mg total) by mouth 2 (two) times daily.      sucralfate 1 GM/10ML suspension   Commonly known as: CARAFATE   Take 1 g by mouth 2 (two) times daily.      traMADol 50 MG tablet   Commonly known as: ULTRAM   Take 50 mg by mouth every 8 (eight) hours as needed. For pain           Outstanding Labs/Studies  None  Duration of Discharge Encounter   Greater than 30 minutes including physician time.  Signed, Murray Hodgkins NP 08/25/2011, 4:18 PM

## 2011-08-25 NOTE — ED Notes (Addendum)
Stress test with Lexiscan started at 1158, completed at 1205.  Pt tolerated well without complaint of adverse effects or symptoms

## 2011-08-26 ENCOUNTER — Other Ambulatory Visit: Payer: Self-pay | Admitting: *Deleted

## 2011-08-26 MED ORDER — TRAMADOL HCL 50 MG PO TABS: 50.0000 mg | ORAL_TABLET | Freq: Three times a day (TID) | ORAL | Status: DC | PRN

## 2011-08-26 MED ORDER — CLONAZEPAM 1 MG PO TABS: 1.0000 mg | ORAL_TABLET | Freq: Two times a day (BID) | ORAL | Status: DC | PRN

## 2011-08-26 NOTE — Progress Notes (Signed)
Utilization Review Completed.Donne Anon T5/30/2013

## 2011-08-26 NOTE — Telephone Encounter (Signed)
Faxed script back to [facility]... 08/26/11@4 :13pm/LMB

## 2011-08-27 ENCOUNTER — Encounter: Payer: 59 | Admitting: Internal Medicine

## 2011-09-16 ENCOUNTER — Other Ambulatory Visit: Payer: Self-pay | Admitting: Physician Assistant

## 2011-09-24 ENCOUNTER — Encounter: Payer: Self-pay | Admitting: Gastroenterology

## 2011-09-24 ENCOUNTER — Ambulatory Visit (INDEPENDENT_AMBULATORY_CARE_PROVIDER_SITE_OTHER): Payer: Medicare Other | Admitting: Gastroenterology

## 2011-09-24 VITALS — BP 130/90 | HR 80 | Ht 64.0 in | Wt 185.8 lb

## 2011-09-24 DIAGNOSIS — D509 Iron deficiency anemia, unspecified: Secondary | ICD-10-CM

## 2011-09-24 MED ORDER — MOVIPREP 100 G PO SOLR: 1.0000 | Freq: Once | ORAL | Status: DC

## 2011-09-24 NOTE — Patient Instructions (Addendum)
You have been scheduled for a colonoscopy with propofol. Please follow written instructions given to you at your visit today.  Please pick up your prep kit at the pharmacy within the next 1-3 days. cc: Gwendolyn Grant, MD

## 2011-09-24 NOTE — Progress Notes (Signed)
History of Present Illness: This is a 51 year old female who returns for followup of GERD and iron deficiency anemia. Recent upper endoscopy showed friable erosive esophagitis Cameron erosions and a hiatal hernia. Her reflux symptoms have been under better control on Nexium twice a Sword but she states she does have vomiting and regurgitation about once per week. She has noted one episode of hematemesis.  Current Medications, Allergies, Past Medical History, Past Surgical History, Family History and Social History were reviewed in [employer] record.  Physical Exam: General: Well developed , well nourished, no acute distress Head: Normocephalic and atraumatic Eyes:  sclerae anicteric, EOMI Ears: Normal auditory acuity Mouth: No deformity or lesions Lungs: Clear throughout to auscultation Heart: Regular rate and rhythm; no murmurs, rubs or bruits Abdomen: Soft, non tender and non distended. No masses, hepatosplenomegaly or hernias noted. Normal Bowel sounds Musculoskeletal: Symmetrical with no gross deformities  Pulses:  Normal pulses noted Extremities: No clubbing, cyanosis, edema or deformities noted Neurological: Alert oriented x 4, grossly nonfocal Psychological:  Alert and cooperative. Normal mood and affect  Assessment and Recommendations:  1. GERD with erosive esophagitis. Good but not excellent symptom control. Continue Nexium 40 mg twice a Voiles ranitidine 150 mg twice a Schramm Carafate twice a Harrower when necessary. Strictly follow all antireflux measures. If her symptoms are not controlled consider further evaluation for gastroparesis or a gastroparesis diet.  2. Iron deficiency anemia. This is likely secondary to [facility] erosions or friable esophagitis. Continue iron replacement. Rule out colorectal lesions. Schedule colonoscopy. The risks, benefits, and alternatives to colonoscopy with possible biopsy and possible polypectomy were discussed with the patient and they  consent to proceed.

## 2011-10-07 ENCOUNTER — Ambulatory Visit (INDEPENDENT_AMBULATORY_CARE_PROVIDER_SITE_OTHER): Payer: Medicare Other | Admitting: Internal Medicine

## 2011-10-07 ENCOUNTER — Telehealth: Payer: Self-pay

## 2011-10-07 ENCOUNTER — Encounter: Payer: Self-pay | Admitting: Internal Medicine

## 2011-10-07 VITALS — BP 158/120 | HR 82 | Temp 98.2°F | Ht 63.0 in | Wt 184.0 lb

## 2011-10-07 DIAGNOSIS — F411 Generalized anxiety disorder: Secondary | ICD-10-CM

## 2011-10-07 DIAGNOSIS — R079 Chest pain, unspecified: Secondary | ICD-10-CM

## 2011-10-07 DIAGNOSIS — I10 Essential (primary) hypertension: Secondary | ICD-10-CM

## 2011-10-07 MED ORDER — QUETIAPINE FUMARATE 50 MG PO TABS: 50.0000 mg | ORAL_TABLET | Freq: Two times a day (BID) | ORAL | Status: DC

## 2011-10-07 MED ORDER — ATENOLOL 50 MG PO TABS
50.0000 mg | ORAL_TABLET | Freq: Every day | ORAL | Status: DC
Start: 2011-10-07 — End: 2011-11-05

## 2011-10-07 NOTE — Assessment & Plan Note (Signed)
Long hx same - noncompliance with meds Stop SSRI (celexa), add seroquel 50 bid - erx done continue to work with counselor Manuela Schwartz and [facility]

## 2011-10-07 NOTE — Progress Notes (Signed)
Subjective:    Patient ID: Hayley Miller, female    DOB: 05-Feb-1961, 51 y.o.   MRN: [id number]  HPI  Here for follow up - reviewed chronic medical issues   CAD - hx MI with angio 2006 - repeat hosp 10/2009 and 07/2011 for recurring chest pain symptoms - noncardiac etiology - cards follow up planned   HTN - reports variable compliance with ongoing medical treatment and no changes in medication dose or frequency. denies adverse side effects related to current therapy. no edema   dyslipidemia -prescribed statin and Lopid - reports variable compliance with ongoing medical treatment and no changes in medication dose or frequency. denies adverse side effects related to current therapy.     Anxiety/depression, ?bipolar - complicated by insomnia, chronic history of same, intolerant of ambien - working with Manuela Schwartz ([facility]) but "not helping" with increased panic attack and anxiety symptoms.   GERD - ongoing evaluation by GI - reports uncontrolled indigestion despite PI bid, H2B bid and carafate bid. Denies abdominal pain, bowel change, nausea or vomiting.   Past Medical History  Diagnosis Date  . HTN (hypertension)   . HLD (hyperlipidemia)   . CAD (coronary artery disease) 11/2004    s/p PCI 9/06 due to ant MI  . Chronic pain syndrome   . Hemorrhage     upper gastrointestional. GI bleed 10/09 due to NSAID  . Depression   . GERD (gastroesophageal reflux disease)   . Anxiety   . Fibromyalgia   . Rhinitis   . Myocardial infarction     x2  . Allergy   . Anemia   . Erosive esophagitis   . Cameron lesion, acute   . Hiatal hernia        Review of Systems  Constitutional: Positive for fatigue. Negative for fever.  Genitourinary: Negative for dysuria and hematuria.  Musculoskeletal: Positive for back pain (chronic). Negative for joint swelling and gait problem.  Psychiatric/Behavioral: Positive for agitation. Negative for hallucinations, behavioral problems, confusion and self-injury. The  patient is nervous/anxious.        Objective:   Physical Exam  BP 158/120  Pulse 82  Temp 98.2 F (36.8 C) (Oral)  Ht 5\' 3"  (1.6 m)  Wt 184 lb (83.462 kg)  BMI 32.59 kg/m2  SpO2 96%  LMP 08/28/2011 Constitutional: She appears well-developed and well-nourished. No distress.  Neck: Normal range of motion. Neck supple. No JVD present. No thyromegaly present.  Cardiovascular: Normal rate, regular rhythm and normal heart sounds.  No murmur heard. No BLE edema. Pulmonary/Chest: Effort normal and breath sounds normal. No respiratory distress. She has no wheezes.  Psychiatric: She has minimally anxious mood and affect, slightly aggressive manner. Her behavior is othewise normal. Judgment and thought content normal.   Lab Results  Component Value Date   WBC 9.2 08/24/2011   HGB 11.4* 08/24/2011   HCT 36.3 08/24/2011   PLT 454* 08/24/2011   GLUCOSE 86 08/24/2011   CHOL 151 08/25/2011   TRIG 105 08/25/2011   HDL 55 08/25/2011   LDLDIRECT 125.0 06/21/2008   LDLCALC 75 08/25/2011   ALT 14 08/24/2011   AST 18 08/24/2011   NA 137 08/24/2011   K 3.0* 08/24/2011   CL 99 08/24/2011   CREATININE 1.17* 08/24/2011   BUN 9 08/24/2011   CO2 28 08/24/2011   TSH 3.101 08/25/2011   INR 1.00 11/14/2009   HGBA1C 4.7 08/25/2011       Assessment & Plan:   See problem list. Medications  and labs reviewed today.  chest pain - recent hosp late 07/2011 reviewed - stress test neg ischemia - keep follow up cards as planned and work on risk reduction/med mgmt

## 2011-10-07 NOTE — Telephone Encounter (Signed)
Called pt no answer LMOM RTC .Marland KitchenMarland Kitchen7/11/13@2 :06pm/LMB

## 2011-10-07 NOTE — Patient Instructions (Signed)
It was good to see you today. We have reviewed your prior records including labs and tests today Stop celexa and start Seroquel 2x/Moustafa for your bipolar ranges and depression symptoms  Start atenolol for blood pressure - this will replace previously prescribed labetalol Your prescription(s) have been submitted to your pharmacy. Please take as directed and contact our office if you believe you are having problem(s) with the medication(s). Continue working with your other specialists as ongoing Please schedule followup in 3-4 months for blood pressure recheck, call sooner if problems.

## 2011-10-07 NOTE — Assessment & Plan Note (Signed)
BP Readings from Last 3 Encounters:  10/07/11 158/120  09/24/11 130/90  08/25/11 124/88   Reminded of need for compliance Simplify beta-blocker to qd atenolol (stop labetalol)

## 2011-10-07 NOTE — Telephone Encounter (Signed)
Ask pt to stop Reglan if she has this at home (not active on our list and pt denied taking same) Continue seroquel as rx'd

## 2011-10-07 NOTE — Telephone Encounter (Signed)
Pharmacy called to inform MD of drug interactions - Seroquel taken with Reglan increases the risk of tardive dyskinesia. Please advise.

## 2011-10-08 NOTE — Telephone Encounter (Signed)
Called pt again still no answer LMOM RTC... 10/08/11@10 :17am/LMB

## 2011-10-08 NOTE — Telephone Encounter (Signed)
Tried calling pt again still no answer. Notified pharmacy spoke wih sara/pharmacist gave md response... 10/08/11@3 :19pm/LMB

## 2011-10-11 ENCOUNTER — Encounter: Payer: Self-pay | Admitting: Internal Medicine

## 2011-10-14 ENCOUNTER — Telehealth: Payer: Self-pay | Admitting: *Deleted

## 2011-10-14 NOTE — Telephone Encounter (Signed)
Spoke with patient and reminded her of labs due next week. 

## 2011-10-14 NOTE — Telephone Encounter (Signed)
Message copied by Hulan Saas on Thu Oct 14, 2011  9:01 AM ------      Message from: Hulan Saas      Created: Mon Jul 19, 2011  4:44 PM       Call and remind patient to have CBC, iron panel 10/18/11(AE)

## 2011-10-21 ENCOUNTER — Other Ambulatory Visit (INDEPENDENT_AMBULATORY_CARE_PROVIDER_SITE_OTHER): Payer: Medicare Other

## 2011-10-21 DIAGNOSIS — D509 Iron deficiency anemia, unspecified: Secondary | ICD-10-CM

## 2011-10-21 DIAGNOSIS — E611 Iron deficiency: Secondary | ICD-10-CM

## 2011-10-21 LAB — CBC WITH DIFFERENTIAL/PLATELET
Basophils Relative: 0.3 % (ref 0.0–3.0)
Eosinophils Absolute: 0.2 10*3/uL (ref 0.0–0.7)
Eosinophils Relative: 2.2 % (ref 0.0–5.0)
HCT: 43.1 % (ref 36.0–46.0)
Lymphs Abs: 2.1 10*3/uL (ref 0.7–4.0)
MCHC: 32.8 g/dL (ref 30.0–36.0)
MCV: 94 fl (ref 78.0–100.0)
Monocytes Absolute: 0.9 10*3/uL (ref 0.1–1.0)
Neutrophils Relative %: 61.5 % (ref 43.0–77.0)
Platelets: 490 10*3/uL — ABNORMAL HIGH (ref 150.0–400.0)
RBC: 4.58 Mil/uL (ref 3.87–5.11)
WBC: 8.3 10*3/uL (ref 4.5–10.5)

## 2011-10-22 ENCOUNTER — Other Ambulatory Visit: Payer: Self-pay | Admitting: *Deleted

## 2011-10-22 MED ORDER — CLONAZEPAM 1 MG PO TABS: 1.0000 mg | ORAL_TABLET | Freq: Two times a day (BID) | ORAL | Status: DC | PRN

## 2011-10-22 MED ORDER — TRAMADOL HCL 50 MG PO TABS: 50.0000 mg | ORAL_TABLET | Freq: Three times a day (TID) | ORAL | Status: DC | PRN

## 2011-10-22 NOTE — Telephone Encounter (Signed)
Faxed refill request received from pharmacy for Tramadol, 1 tab every 8 hours Last filled by MD on 08/26/11, #30 x 1  Faxed refill request received from pharmacy for clonazepam, 1 tab bid prn Last filled by MD on 08/26/11, #60 x 1  Last seen on 10/07/11 Follow up 01/20/12 Please advise refills.

## 2011-10-22 NOTE — Telephone Encounter (Signed)
RX faxed

## 2011-10-24 ENCOUNTER — Other Ambulatory Visit: Payer: Self-pay | Admitting: Gastroenterology

## 2011-10-24 ENCOUNTER — Other Ambulatory Visit: Payer: Self-pay | Admitting: Physician Assistant

## 2011-10-26 ENCOUNTER — Other Ambulatory Visit: Payer: Self-pay | Admitting: *Deleted

## 2011-10-26 MED ORDER — TRAMADOL HCL 50 MG PO TABS: 50.0000 mg | ORAL_TABLET | Freq: Three times a day (TID) | ORAL | Status: DC | PRN

## 2011-11-01 ENCOUNTER — Ambulatory Visit (AMBULATORY_SURGERY_CENTER): Payer: Medicare Other | Admitting: Gastroenterology

## 2011-11-01 ENCOUNTER — Encounter: Payer: Self-pay | Admitting: Gastroenterology

## 2011-11-01 VITALS — BP 147/101 | HR 73 | Temp 97.5°F | Resp 23 | Ht 64.0 in | Wt 185.0 lb

## 2011-11-01 DIAGNOSIS — D509 Iron deficiency anemia, unspecified: Secondary | ICD-10-CM

## 2011-11-01 MED ORDER — SODIUM CHLORIDE 0.9 % IV SOLN: 500.0000 mL | INTRAVENOUS | Status: DC

## 2011-11-01 NOTE — Op Note (Signed)
[facility] [address] [place], [place]  [id number]  COLONOSCOPY PROCEDURE REPORT  PATIENT:  Hayley, Miller  MR#:  [id number] BIRTHDATE:  Dec 27, 1960, 51 yrs. old  GENDER:  female ENDOSCOPIST:  Norberto Sorenson T. Fuller Plan, MD, [facility]  PROCEDURE DATE:  11/01/2011 PROCEDURE:  Colonoscopy [id number] ASA CLASS:  Class II INDICATIONS:  1) Iron deficiency anemia MEDICATIONS:   MAC sedation, administered by CRNA, propofol (Diprivan) 300 mg IV DESCRIPTION OF PROCEDURE:   After the risks benefits and alternatives of the procedure were thoroughly explained, informed consent was obtained.  Digital rectal exam was performed and revealed no abnormalities.  The LB [id number] endoscope was introduced through the anus and advanced to the cecum, which was identified by both the appendix and ileocecal valve, limited by a tortuous and redundant colon, fair prep.    The quality of the prep was Moviprep fair.  The instrument was then slowly withdrawn as the colon was fully examined. <<PROCEDUREIMAGES>> FINDINGS:  Mild diverticulosis was found in the sigmoid colon. Otherwise normal colonoscopy without other polyps, masses, vascular ectasias, or inflammatory changes. Retroflexed views in the rectum revealed no abnormalities. The time to cecum =  4.25  minutes. The scope was then withdrawn (time =  12  min) from the patient and the procedure completed.  COMPLICATIONS:  None  ENDOSCOPIC IMPRESSION: 1) Mild diverticulosis in the sigmoid colon  RECOMMENDATIONS: 1) High fiber diet with liberal fluid intake. 2) Repeat Colonoscopy in 7 years for routine colorectal cancer screening with a more extensive bowel prep.  Hayley Miller. Fuller Plan, MD, [facility] Regal  n. eSIGNED:   Pricilla Miller. Hayley Miller at 11/01/2011 03:56 PM  [place], Olin Hauser, [id number]

## 2011-11-01 NOTE — Patient Instructions (Addendum)
Discharge instructions given with verbal understanding. Handouts on diverticulosis and a high fiber diet given. Resume previous medications.YOU HAD AN ENDOSCOPIC PROCEDURE TODAY AT THE [facility]: Refer to the procedure report that was given to you for any specific questions about what was found during the examination.  If the procedure report does not answer your questions, please call your gastroenterologist to clarify.  If you requested that your care partner not be given the details of your procedure findings, then the procedure report has been included in a sealed envelope for you to review at your convenience later.  YOU SHOULD EXPECT: Some feelings of bloating in the abdomen. Passage of more gas than usual.  Walking can help get rid of the air that was put into your GI tract during the procedure and reduce the bloating. If you had a lower endoscopy (such as a colonoscopy or flexible sigmoidoscopy) you may notice spotting of blood in your stool or on the toilet paper. If you underwent a bowel prep for your procedure, then you may not have a normal bowel movement for a few days.  DIET: Your first meal following the procedure should be a light meal and then it is ok to progress to your normal diet.  A half-sandwich or bowl of soup is an example of a good first meal.  Heavy or fried foods are harder to digest and may make you feel nauseous or bloated.  Likewise meals heavy in dairy and vegetables can cause extra gas to form and this can also increase the bloating.  Drink plenty of fluids but you should avoid alcoholic beverages for 24 hours.  ACTIVITY: Your care partner should take you home directly after the procedure.  You should plan to take it easy, moving slowly for the rest of the [place].  You can resume normal activity the [place] after the procedure however you should NOT DRIVE or use heavy machinery for 24 hours (because of the sedation medicines used during the test).    SYMPTOMS TO  REPORT IMMEDIATELY: A gastroenterologist can be reached at any hour.  During normal business hours, 8:30 AM to 5:00 PM Monday through Friday, call [phone].  After hours and on weekends, please call the GI answering service at [phone] who will take a message and have the physician on call contact you.   Following lower endoscopy (colonoscopy or flexible sigmoidoscopy):  Excessive amounts of blood in the stool  Significant tenderness or worsening of abdominal pains  Swelling of the abdomen that is new, acute  Fever of 100F or higher  FOLLOW UP: If any biopsies were taken you will be contacted by phone or by letter within the next 1-3 weeks.  Call your gastroenterologist if you have not heard about the biopsies in 3 weeks.  Our staff will call the home number listed on your records the next business [place] following your procedure to check on you and address any questions or concerns that you may have at that time regarding the information given to you following your procedure. This is a courtesy call and so if there is no answer at the home number and we have not heard from you through the emergency physician on call, we will assume that you have returned to your regular daily activities without incident.  SIGNATURES/CONFIDENTIALITY: You and/or your care partner have signed paperwork which will be entered into your electronic medical record.  These signatures attest to the fact that that the information above on your   After Visit Summary has been reviewed and is understood.  Full responsibility of the confidentiality of this discharge information lies with you and/or your care-partner.   

## 2011-11-01 NOTE — Progress Notes (Addendum)
PROPOFOL PER S CAMP CRNA. SEE SCANNED INTRA PROCEDURE REPORT. EWM  PRIOR TO PROCEDURE START PT PULLED IV FROM RIGHT HAND WHILE MOVING UNDER THE COVERS. IV DISCONTINUED WITH CATHETER IN PLACE AND NEW STARTED IN LEFT HAND BY S CAMP CRNA. PT TOLERATED RESTART WELL. EWM

## 2011-11-01 NOTE — Progress Notes (Signed)
Patient did not experience any of the following events: a burn prior to discharge; a fall within the facility; wrong site/side/patient/procedure/implant event; or a hospital transfer or hospital admission upon discharge from the facility. (G8907) Patient did not have preoperative order for IV antibiotic SSI prophylaxis. (G8918)  

## 2011-11-02 ENCOUNTER — Telehealth: Payer: Self-pay | Admitting: *Deleted

## 2011-11-02 NOTE — Telephone Encounter (Signed)
  Follow up Call-  Call back number 11/01/2011 07/19/2011  Post procedure Call Back phone  # [phone] cell [phone]  Permission to leave phone message No Yes     Patient questions:  Do you have a fever, pain , or abdominal swelling? no Pain Score  0 *  Have you tolerated food without any problems? yes  Have you been able to return to your normal activities? yes  Do you have any questions about your discharge instructions: Diet   no Medications  no Follow up visit  no  Do you have questions or concerns about your Care? no  Actions: * If pain score is 4 or above: No action needed, pain <4.  Pt. Stated she had pain yesterday that went to her leg,but not having it today. Instructed pt. To call if she has any problems today,she stated okay.

## 2011-11-05 ENCOUNTER — Encounter (HOSPITAL_COMMUNITY): Payer: Self-pay | Admitting: *Deleted

## 2011-11-05 ENCOUNTER — Emergency Department (HOSPITAL_COMMUNITY)
Admission: EM | Admit: 2011-11-05 | Discharge: 2011-11-06 | Disposition: A | Discharge status: Home / Self Care | Payer: Medicare Other | Attending: Emergency Medicine | Admitting: Emergency Medicine

## 2011-11-05 DIAGNOSIS — F411 Generalized anxiety disorder: Secondary | ICD-10-CM | POA: Insufficient documentation

## 2011-11-05 DIAGNOSIS — K219 Gastro-esophageal reflux disease without esophagitis: Secondary | ICD-10-CM | POA: Insufficient documentation

## 2011-11-05 DIAGNOSIS — M62838 Other muscle spasm: Secondary | ICD-10-CM

## 2011-11-05 DIAGNOSIS — F3289 Other specified depressive episodes: Secondary | ICD-10-CM | POA: Insufficient documentation

## 2011-11-05 DIAGNOSIS — Z87891 Personal history of nicotine dependence: Secondary | ICD-10-CM | POA: Insufficient documentation

## 2011-11-05 DIAGNOSIS — I2510 Atherosclerotic heart disease of native coronary artery without angina pectoris: Secondary | ICD-10-CM | POA: Insufficient documentation

## 2011-11-05 DIAGNOSIS — E785 Hyperlipidemia, unspecified: Secondary | ICD-10-CM | POA: Insufficient documentation

## 2011-11-05 DIAGNOSIS — IMO0001 Reserved for inherently not codable concepts without codable children: Secondary | ICD-10-CM | POA: Insufficient documentation

## 2011-11-05 DIAGNOSIS — I10 Essential (primary) hypertension: Secondary | ICD-10-CM | POA: Insufficient documentation

## 2011-11-05 DIAGNOSIS — I252 Old myocardial infarction: Secondary | ICD-10-CM | POA: Insufficient documentation

## 2011-11-05 DIAGNOSIS — F329 Major depressive disorder, single episode, unspecified: Secondary | ICD-10-CM | POA: Insufficient documentation

## 2011-11-05 DIAGNOSIS — Z79899 Other long term (current) drug therapy: Secondary | ICD-10-CM | POA: Insufficient documentation

## 2011-11-05 DIAGNOSIS — M5380 Other specified dorsopathies, site unspecified: Secondary | ICD-10-CM | POA: Insufficient documentation
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
## 2011-11-06 MED ORDER — METHOCARBAMOL 500 MG PO TABS: 1000.0000 mg | ORAL_TABLET | Freq: Four times a day (QID) | ORAL | Status: DC

## 2011-11-06 MED ORDER — HYDROCODONE-ACETAMINOPHEN 5-325 MG PO TABS
1.0000 | ORAL_TABLET | Freq: Once | ORAL | Status: AC
  Administered 2011-11-06: 1 via ORAL
  Filled 2011-11-06: qty 1
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
## 2011-11-06 NOTE — ED Provider Notes (Signed)
Medical screening examination/treatment/procedure(s) were performed by non-physician practitioner and as supervising physician I was immediately available for consultation/collaboration.  Teressa Lower, MD 11/06/11 [phone]

## 2011-11-08 ENCOUNTER — Other Ambulatory Visit: Payer: Self-pay | Admitting: *Deleted

## 2011-11-08 MED ORDER — METOCLOPRAMIDE HCL 5 MG PO TABS: 5.0000 mg | ORAL_TABLET | Freq: Four times a day (QID) | ORAL | Status: DC

## 2011-11-11 ENCOUNTER — Encounter: Payer: Self-pay | Admitting: Internal Medicine

## 2011-11-11 ENCOUNTER — Ambulatory Visit (INDEPENDENT_AMBULATORY_CARE_PROVIDER_SITE_OTHER): Payer: Medicare Other | Admitting: Internal Medicine

## 2011-11-11 VITALS — BP 139/94 | HR 100 | Ht 64.0 in | Wt 188.0 lb

## 2011-11-11 DIAGNOSIS — I2510 Atherosclerotic heart disease of native coronary artery without angina pectoris: Secondary | ICD-10-CM

## 2011-11-11 DIAGNOSIS — R0602 Shortness of breath: Secondary | ICD-10-CM

## 2011-11-11 MED ORDER — ATENOLOL 50 MG PO TABS: ORAL_TABLET | ORAL | Status: DC

## 2011-11-11 NOTE — Progress Notes (Signed)
HPI Patient is a 64 eyra old who was admitted to [facility] in May.  History of CAD, HL, HTN, anxiety.  Admit for CP  Myoview on 5/29 was normal. Since d/c she has had 1 episode of CP  Relieved with 1 NTG She says she gets SOB with activity.  No PND  No signif edema. Allergies  Allergen Reactions  . Clonidine Hydrochloride     REACTION: FATIGUE  . Ibuprofen     REACTION: GI bleed  . Lovastatin     REACTION: pains    Current Outpatient Prescriptions  Medication Sig Dispense Refill  . amitriptyline (ELAVIL) 100 MG tablet Take 100 mg by mouth at bedtime.      [name] aspirin 81 MG tablet Take 81 mg by mouth daily.      [name] atenolol (TENORMIN) 50 MG tablet Take 50 mg by mouth daily.      . citalopram (CELEXA) 40 MG tablet Take 40 mg by mouth daily.      . clonazePAM (KLONOPIN) 1 MG tablet Take 1 mg by mouth 2 (two) times daily as needed. For anxiety      . esomeprazole (NEXIUM) 40 MG capsule Take 40 mg by mouth 2 (two) times daily.      [name] FeFum-FePoly-FA-B Cmp-C-Biot (INTEGRA PLUS PO) Take 1 capsule by mouth daily.      [name] gemfibrozil (LOPID) 600 MG tablet Take 600 mg by mouth 2 (two) times daily before a meal.      . hydrochlorothiazide (HYDRODIURIL) 25 MG tablet Take 25 mg by mouth daily.      . methocarbamol (ROBAXIN) 500 MG tablet Take 2 tablets (1,000 mg total) by mouth 4 (four) times daily.  20 tablet  0  . metoCLOPramide (REGLAN) 10 MG tablet Take 10 mg by mouth 5 (five) times daily as needed. For pain      . mometasone (NASONEX) 50 MCG/ACT nasal spray Place 2 sprays into the nose daily.      . nitroGLYCERIN (NITROSTAT) 0.4 MG SL tablet Place 0.4 mg under the tongue every 5 (five) minutes as needed. For chest pain      . pravastatin (PRAVACHOL) 40 MG tablet Take 40 mg by mouth at bedtime.      . promethazine (PHENERGAN) 25 MG tablet Take 25 mg by mouth every 6 (six) hours as needed.      . ranitidine (ZANTAC) 150 MG tablet Take 150 mg by mouth 2 (two) times daily.      . sucralfate  (CARAFATE) 1 GM/10ML suspension Take 1 g by mouth 4 (four) times daily -  with meals and at bedtime.      . traMADol (ULTRAM) 50 MG tablet Take 50 mg by mouth every 8 (eight) hours as needed. For pain       Current Facility-Administered Medications  Medication Dose Route Frequency Provider Last Rate Last Dose  . 0.9 %  sodium chloride infusion  500 mL Intravenous Continuous Ladene Artist, MD,FACG        Past Medical History  Diagnosis Date  . HTN (hypertension)   . HLD (hyperlipidemia)   . CAD (coronary artery disease) 11/2004    s/p PCI 9/06 due to ant MI  . Chronic pain syndrome   . Hemorrhage     upper gastrointestional. GI bleed 10/09 due to NSAID  . Depression   . GERD (gastroesophageal reflux disease)   . Anxiety   . Fibromyalgia   . Rhinitis   . Myocardial infarction  x2  . Allergy   . Anemia   . Erosive esophagitis   . Cameron lesion, acute   . Hiatal hernia     Past Surgical History  Procedure Date  . L breast - cyst removed 2002  . Cardiac catheterization   . Upper gastrointestinal endoscopy     Family History  Problem Relation Age of Onset  . Hypertension Mother   . Heart murmur Mother   . Colon polyps Mother   . Colon cancer Maternal Aunt   . Heart disease Son   . Heart disease Maternal Grandmother   . Diabetes Maternal Aunt   . Esophageal cancer Neg Hx   . Rectal cancer Neg Hx   . Stomach cancer Neg Hx     History   Social History  . Marital Status: Single    Spouse Name: N/A    Number of Children: 1  . Years of Education: N/A   Occupational History  . unemployed    Social History Main Topics  . Smoking status: Current Everyday Smoker  . Smokeless tobacco: Never Used   Comment: Divorced, lives with female domestic partner  . Alcohol Use: Yes     occasional  . Drug Use: No  . Sexually Active: Yes    Birth Control/ Protection: Other-see comments     has a female domestic partner   Other Topics Concern  . Not on file   Social  History Narrative   Lives alone - divorced; 1 child. Used to live with female domestic partnerUnemployed - disability.     Review of Systems:  All systems reviewed.  They are negative to the above problem except as previously stated.  Vital Signs: BP 139/94  Pulse 100  Ht 5\' 4"  (1.626 m)  Wt 188 lb (85.276 kg)  BMI 32.27 kg/m2  LMP 11/01/2011  Physical Exam Patient in NAD  BP on my check is 145/94. HEENT:  Normocephalic, atraumatic. EOMI, PERRLA.  Neck: JVP is normal.  No bruits.  Lungs: clear to auscultation. No rales no wheezes.  Heart: Regular rate and rhythm. Normal S1, S2. No S3.   No significant murmurs. PMI not displaced.  Abdomen:  Supple, nontender. Normal bowel sounds. No masses. No hepatomegaly.  Extremities:   Good distal pulses throughout. No lower extremity edema.  Musculoskeletal :moving all extremities.  Neuro:   alert and oriented x3.  CN II-XII grossly intact.  EKG:  SR 100.  Poss IWMI.  Assessment and Plan:  1.  CAD  I am not convinced of any actvie angina.  I would keep on same regimen.  I would increase atenolol to 75 per Massi.  Check BP in 2 wks  2.  Dyspnea.  Will get echo to evaluate diasotlic funciton  3.  HL  Kepp on same meds.  4.  HTN  As above.  Increase atenolol

## 2011-11-11 NOTE — Patient Instructions (Signed)
Your physician has requested that you have an echocardiogram. Echocardiography is a painless test that uses sound waves to create images of your heart. It provides your doctor with information about the size and shape of your heart and how well your heart's chambers and valves are working. This procedure takes approximately one hour. There are no restrictions for this procedure.  BP check N[place]ert of echo.  Increase Atenolol to 75 mg every [place].  Return to see Dr.Ross in 4 months.

## 2011-11-12 ENCOUNTER — Encounter: Payer: Self-pay | Admitting: Internal Medicine

## 2011-11-12 ENCOUNTER — Ambulatory Visit (INDEPENDENT_AMBULATORY_CARE_PROVIDER_SITE_OTHER): Payer: Medicare Other | Admitting: Internal Medicine

## 2011-11-12 VITALS — BP 118/88 | HR 72 | Temp 97.7°F | Ht 64.0 in | Wt 187.0 lb

## 2011-11-12 DIAGNOSIS — M542 Cervicalgia: Secondary | ICD-10-CM

## 2011-11-12 DIAGNOSIS — S134XXA Sprain of ligaments of cervical spine, initial encounter: Secondary | ICD-10-CM

## 2011-11-12 DIAGNOSIS — S139XXA Sprain of joints and ligaments of unspecified parts of neck, initial encounter: Secondary | ICD-10-CM

## 2011-11-12 DIAGNOSIS — M62838 Other muscle spasm: Secondary | ICD-10-CM

## 2011-11-12 MED ORDER — CARISOPRODOL 350 MG PO TABS: 350.0000 mg | ORAL_TABLET | Freq: Four times a day (QID) | ORAL | Status: DC | PRN

## 2011-11-12 MED ORDER — HYDROCODONE-ACETAMINOPHEN 5-500 MG PO TABS: 1.0000 | ORAL_TABLET | Freq: Three times a day (TID) | ORAL | Status: DC | PRN

## 2011-11-12 MED ORDER — METOCLOPRAMIDE HCL 5 MG PO TABS: 5.0000 mg | ORAL_TABLET | Freq: Four times a day (QID) | ORAL | Status: DC

## 2011-11-12 MED ORDER — METHYLPREDNISOLONE ACETATE 80 MG/ML IJ SUSP
120.0000 mg | Freq: Once | INTRAMUSCULAR | Status: AC
  Administered 2011-11-12: 120 mg via INTRAMUSCULAR
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
## 2011-11-12 NOTE — Addendum Note (Signed)
Addended by: Rebeca Alert C on: 11/12/2011 10:31 AM   Modules accepted: Orders
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
## 2011-11-19 ENCOUNTER — Other Ambulatory Visit (HOSPITAL_COMMUNITY): Payer: Medicare Other

## 2011-11-24 ENCOUNTER — Telehealth: Payer: Self-pay | Admitting: Internal Medicine

## 2011-11-24 NOTE — Telephone Encounter (Signed)
Caller: Ishita/Patient; Patient Name: Hayley Miller, Hayley Miller; PCP: Gwendolyn Grant (Adults only); [facility] Call[facility] Phone Number: [phone] States is needing refill for "bi polar" medicine and pharmacy will not refill as states interferes with another medicine that was prescribed by Dr. Hervey Ard. Does not know name of medicines. States medicine that was prescribed by Dr. Hervey Ard is for "stomach". Phaarmacy has sent request to office requesting another medicine but office has not responded. [facility]/[phone] notified and states patient was prescribed Reglan and is taking Seraquel. States can cause tartive dyskinesia and can be permanent so these two medicines are not prescribed together. Needs Reglan changed to another medicine or Seraquel changed. PLEASE CALL [facility] [phone] AND ADVISE WHAT MEDICINE CAN CHANGE

## 2011-11-25 NOTE — Telephone Encounter (Signed)
Left message on machine for pt to return my call  

## 2011-11-25 NOTE — Telephone Encounter (Signed)
Pt return call back gave her md response. Notified [place] spoke with Kelly/pharmacist gave md recommendations...Hayley Miller

## 2011-11-25 NOTE — Telephone Encounter (Signed)
Stop reglan - continue Seroquel For stomach, continue Carafate, promethazine, nexium AND zantac Pt should discuss other stomach concerns with her GI doc  thanks

## 2011-11-30 ENCOUNTER — Other Ambulatory Visit: Payer: Self-pay | Admitting: *Deleted

## 2011-11-30 ENCOUNTER — Other Ambulatory Visit: Payer: Self-pay | Admitting: Obstetrics and Gynecology

## 2011-11-30 DIAGNOSIS — D249 Benign neoplasm of unspecified breast: Secondary | ICD-10-CM

## 2011-11-30 MED ORDER — CARISOPRODOL 350 MG PO TABS: 350.0000 mg | ORAL_TABLET | Freq: Four times a day (QID) | ORAL | Status: AC | PRN

## 2011-11-30 MED ORDER — HYDROCODONE-ACETAMINOPHEN 5-500 MG PO TABS: 1.0000 | ORAL_TABLET | Freq: Three times a day (TID) | ORAL | Status: AC | PRN

## 2011-11-30 NOTE — Telephone Encounter (Signed)
Faxed script back to [facility]...Hayley Miller

## 2011-12-02 ENCOUNTER — Ambulatory Visit (HOSPITAL_COMMUNITY): Payer: Medicare Other | Attending: Cardiovascular Disease | Admitting: Radiology

## 2011-12-02 DIAGNOSIS — R0602 Shortness of breath: Secondary | ICD-10-CM

## 2011-12-02 DIAGNOSIS — I079 Rheumatic tricuspid valve disease, unspecified: Secondary | ICD-10-CM | POA: Insufficient documentation

## 2011-12-02 DIAGNOSIS — I10 Essential (primary) hypertension: Secondary | ICD-10-CM | POA: Insufficient documentation

## 2011-12-02 DIAGNOSIS — I2510 Atherosclerotic heart disease of native coronary artery without angina pectoris: Secondary | ICD-10-CM | POA: Insufficient documentation

## 2011-12-02 DIAGNOSIS — F17200 Nicotine dependence, unspecified, uncomplicated: Secondary | ICD-10-CM | POA: Insufficient documentation

## 2011-12-02 NOTE — Progress Notes (Signed)
Echocardiogram performed.  

## 2011-12-03 ENCOUNTER — Other Ambulatory Visit: Payer: Self-pay | Admitting: *Deleted

## 2011-12-06 ENCOUNTER — Other Ambulatory Visit: Payer: Self-pay | Admitting: Obstetrics and Gynecology

## 2011-12-06 ENCOUNTER — Ambulatory Visit
Admission: RE | Admit: 2011-12-06 | Discharge: 2011-12-06 | Disposition: A | Discharge status: Home / Self Care | Payer: Medicare Other | Source: Ambulatory Visit | Attending: Obstetrics and Gynecology | Admitting: Obstetrics and Gynecology

## 2011-12-06 DIAGNOSIS — D249 Benign neoplasm of unspecified breast: Secondary | ICD-10-CM

## 2011-12-09 ENCOUNTER — Other Ambulatory Visit: Payer: Self-pay | Admitting: Internal Medicine

## 2011-12-09 ENCOUNTER — Other Ambulatory Visit: Payer: Self-pay | Admitting: *Deleted

## 2011-12-09 MED ORDER — ATENOLOL 50 MG PO TABS: ORAL_TABLET | ORAL | Status: DC

## 2011-12-09 NOTE — Telephone Encounter (Signed)
Received fax from [facility] for HYDROCODONE/APAP 5-500mg . Last filled on 11/30/11. Ok to Refill? Please Advise. Thanks

## 2011-12-10 NOTE — Telephone Encounter (Signed)
No - pt may use tramadol and tylenol as needed for pain

## 2011-12-13 MED ORDER — CYCLOBENZAPRINE HCL 10 MG PO TABS: 10.0000 mg | ORAL_TABLET | Freq: Three times a day (TID) | ORAL | Status: DC | PRN

## 2011-12-13 NOTE — Telephone Encounter (Signed)
Pt advised that she "needs" something for her pain or at least her muscle spasms. Please advise.

## 2011-12-13 NOTE — Telephone Encounter (Signed)
Flexeril for muscle spasm -

## 2011-12-14 ENCOUNTER — Other Ambulatory Visit: Payer: Self-pay | Admitting: General Practice

## 2011-12-14 NOTE — Telephone Encounter (Signed)
Received fax for refill of HYDROCODONE/APAP 5-500mg , QTY# 20. Please Advise. Thanks

## 2011-12-14 NOTE — Telephone Encounter (Signed)
No

## 2011-12-15 NOTE — Telephone Encounter (Signed)
Pt.notified

## 2011-12-27 ENCOUNTER — Other Ambulatory Visit: Payer: Self-pay | Admitting: *Deleted

## 2011-12-27 MED ORDER — RANITIDINE HCL 150 MG PO TABS: 150.0000 mg | ORAL_TABLET | Freq: Two times a day (BID) | ORAL | Status: DC

## 2011-12-27 NOTE — Telephone Encounter (Signed)
R'cd fax from [facility] for refill of Ranitidine

## 2012-01-10 ENCOUNTER — Other Ambulatory Visit: Payer: Self-pay

## 2012-01-10 MED ORDER — CLONAZEPAM 1 MG PO TABS: 1.0000 mg | ORAL_TABLET | Freq: Two times a day (BID) | ORAL | Status: DC | PRN

## 2012-01-10 NOTE — Telephone Encounter (Signed)
Done hardcopy to [facility]  

## 2012-01-10 NOTE — Telephone Encounter (Signed)
Please advise on refills for this [name] pt - last filled 11/30/2011

## 2012-01-11 NOTE — Telephone Encounter (Signed)
Faxed hardcopy to [facility]

## 2012-01-20 ENCOUNTER — Ambulatory Visit (INDEPENDENT_AMBULATORY_CARE_PROVIDER_SITE_OTHER): Payer: Medicare Other | Admitting: Internal Medicine

## 2012-01-20 ENCOUNTER — Encounter: Payer: Self-pay | Admitting: Internal Medicine

## 2012-01-20 VITALS — BP 142/98 | HR 90 | Temp 97.4°F | Ht 64.0 in | Wt 182.1 lb

## 2012-01-20 DIAGNOSIS — I10 Essential (primary) hypertension: Secondary | ICD-10-CM

## 2012-01-20 DIAGNOSIS — F329 Major depressive disorder, single episode, unspecified: Secondary | ICD-10-CM

## 2012-01-20 DIAGNOSIS — IMO0001 Reserved for inherently not codable concepts without codable children: Secondary | ICD-10-CM

## 2012-01-20 MED ORDER — ATENOLOL 50 MG PO TABS: 50.0000 mg | ORAL_TABLET | Freq: Two times a day (BID) | ORAL | Status: DC

## 2012-01-20 MED ORDER — GABAPENTIN 100 MG PO CAPS: 100.0000 mg | ORAL_CAPSULE | Freq: Three times a day (TID) | ORAL | Status: DC

## 2012-01-20 MED ORDER — QUETIAPINE FUMARATE 50 MG PO TABS: 50.0000 mg | ORAL_TABLET | Freq: Two times a day (BID) | ORAL | Status: DC

## 2012-01-20 MED ORDER — AMITRIPTYLINE HCL 150 MG PO TABS: 150.0000 mg | ORAL_TABLET | Freq: Every day | ORAL | Status: DC

## 2012-01-20 NOTE — Patient Instructions (Signed)
It was good to see you today. We have reviewed your prior records including labs and tests today Be sure you have stopped celexa and reglan start Seroquel 2x/Parady for your bipolar rages and depression symptoms  change atenolol to 1 pill 2x/Ciliberto for blood pressure - Increase dose amitriptyline and start gabapentin 3x/Ecklund for fibromyalgia pain  Your prescription(s) have been submitted to your pharmacy. Please take as directed and contact our office if you believe you are having problem(s) with the medication(s). Continue working with your other specialists as ongoing Please schedule followup in 3-4 months for blood pressure recheck, call sooner if problems.

## 2012-01-20 NOTE — Assessment & Plan Note (Signed)
BP Readings from Last 3 Encounters:  01/20/12 142/98  11/12/11 118/88  11/11/11 139/94   Reminded of need for compliance increase beta-blocker to bid atenolol (previously on labetalol)

## 2012-01-20 NOTE — Progress Notes (Signed)
Subjective:    Patient ID: Hayley Miller, female    DOB: 1960/10/17, 51 y.o.   MRN: [id number]  HPI  Here for follow up - reviewed chronic medical issues   CAD - hx MI with angio 2006 - repeat hosp 10/2009 and 07/2011 for recurring chest pain symptoms - noncardiac etiology - cards follow up planned   HTN - reports variable compliance with ongoing medical treatment and no changes in medication dose or frequency. denies adverse side effects related to current therapy. no edema   dyslipidemia -prescribed statin and Lopid - reports variable compliance with ongoing medical treatment and no changes in medication dose or frequency. denies adverse side effects related to current therapy.     Anxiety/depression, ?bipolar - complicated by insomnia, chronic history of same, intolerant of ambien - working with Manuela Schwartz ([facility]) but "not helping" with increased panic attack and anxiety symptoms.   GERD - ongoing evaluation by GI - reports uncontrolled indigestion despite PI bid, H2B bid and carafate bid. Denies abdominal pain, bowel change, nausea or vomiting.   Past Medical History  Diagnosis Date  . HTN (hypertension)   . HLD (hyperlipidemia)   . CAD (coronary artery disease) 11/2004    s/p PCI 9/06 due to ant MI  . Chronic pain syndrome   . Hemorrhage     upper gastrointestional. GI bleed 10/09 due to NSAID  . Depression   . GERD (gastroesophageal reflux disease)   . Anxiety   . Fibromyalgia   . Rhinitis   . Myocardial infarction     x2  . Allergy   . Anemia   . Erosive esophagitis   . Cameron lesion, acute   . Hiatal hernia        Review of Systems  Constitutional: Positive for fatigue. Negative for fever.  Genitourinary: Negative for dysuria and hematuria.  Musculoskeletal: Positive for back pain (chronic). Negative for joint swelling and gait problem.  Psychiatric/Behavioral: Positive for agitation. Negative for hallucinations, behavioral problems, confusion and self-injury. The  patient is nervous/anxious.        Objective:   Physical Exam  BP 142/98  Pulse 90  Temp 97.4 F (36.3 C) (Oral)  Ht 5\' 4"  (1.626 m)  Wt 182 lb 1.9 oz (82.609 kg)  BMI 31.26 kg/m2  SpO2 96% Wt Readings from Last 3 Encounters:  01/20/12 182 lb 1.9 oz (82.609 kg)  11/12/11 187 lb (84.823 kg)  11/11/11 188 lb (85.276 kg)   Constitutional: She appears well-developed and well-nourished. No distress.  Neck: Normal range of motion. Neck supple. No JVD present. No thyromegaly present.  Cardiovascular: Normal rate, regular rhythm and normal heart sounds.  No murmur heard. No BLE edema. Pulmonary/Chest: Effort normal and breath sounds normal. No respiratory distress. She has no wheezes.  Psychiatric: She has minimally anxious mood and affect, slightly aggressive manner. Her behavior is othewise normal. Judgment and thought content normal.   Lab Results  Component Value Date   WBC 8.3 10/21/2011   HGB 14.1 10/21/2011   HCT 43.1 10/21/2011   PLT 490.0* 10/21/2011   GLUCOSE 86 08/24/2011   CHOL 151 08/25/2011   TRIG 105 08/25/2011   HDL 55 08/25/2011   LDLDIRECT 125.0 06/21/2008   LDLCALC 75 08/25/2011   ALT 14 08/24/2011   AST 18 08/24/2011   NA 137 08/24/2011   K 3.0* 08/24/2011   CL 99 08/24/2011   CREATININE 1.17* 08/24/2011   BUN 9 08/24/2011   CO2 28 08/24/2011   TSH  3.101 08/25/2011   INR 1.00 11/14/2009   HGBA1C 4.7 08/25/2011       Assessment & Plan:   See problem list. Medications and labs reviewed today.

## 2012-01-20 NOTE — Assessment & Plan Note (Addendum)
Long hx same - noncompliance with meds stopped celexa and reglan per pt report resume seroquel 50 bid for "bipolar" Uses schedule klonopin as well continue to work with counselor Manuela Schwartz and [facility] as well as mental health

## 2012-01-20 NOTE — Assessment & Plan Note (Signed)
Chronic pain syndrome complicating diffuse myalgias Increase amitrip dose and add gabapentin Continue tramadol as needed and encouraged exercise and life style modification to help control same patient to continue working on behv heath isues

## 2012-01-28 ENCOUNTER — Ambulatory Visit (INDEPENDENT_AMBULATORY_CARE_PROVIDER_SITE_OTHER): Payer: Medicare Other | Admitting: Internal Medicine

## 2012-01-28 ENCOUNTER — Encounter: Payer: Self-pay | Admitting: Internal Medicine

## 2012-01-28 VITALS — BP 144/98 | HR 80 | Ht 64.0 in | Wt 188.0 lb

## 2012-01-28 DIAGNOSIS — I10 Essential (primary) hypertension: Secondary | ICD-10-CM

## 2012-01-28 MED ORDER — LOSARTAN POTASSIUM 50 MG PO TABS: 50.0000 mg | ORAL_TABLET | Freq: Every day | ORAL | Status: DC

## 2012-01-28 NOTE — Progress Notes (Signed)
HPI Patient is a 51 year old with a history of CAD, HL, HTN and anxiety.  Myoview in May 2013 was norma  I saw her in clinic in August.   When I saw her I increased her atenolol to 75 per Hail  She has since had it increased to 50 bid. She denies CP  Breathing unchanged.  Allergies  Allergen Reactions  . Clonidine Hydrochloride     REACTION: FATIGUE  . Ibuprofen     REACTION: GI bleed  . Lovastatin     REACTION: pains    Current Outpatient Prescriptions  Medication Sig Dispense Refill  . amitriptyline (ELAVIL) 150 MG tablet Take 1 tablet (150 mg total) by mouth at bedtime.  30 tablet  5  . aspirin 81 MG tablet Take 81 mg by mouth daily.      [name] atenolol (TENORMIN) 50 MG tablet Take 1 tablet (50 mg total) by mouth 2 (two) times daily.  60 tablet  6  . clonazePAM (KLONOPIN) 1 MG tablet Take 1 tablet (1 mg total) by mouth 2 (two) times daily as needed. For anxiety  60 tablet  1  . cyclobenzaprine (FLEXERIL) 10 MG tablet Take 1 tablet (10 mg total) by mouth 3 (three) times daily as needed for muscle spasms.  60 tablet  1  . esomeprazole (NEXIUM) 40 MG capsule Take 40 mg by mouth 2 (two) times daily.      [name] gabapentin (NEURONTIN) 100 MG capsule Take 1 capsule (100 mg total) by mouth 3 (three) times daily.  90 capsule  3  . gemfibrozil (LOPID) 600 MG tablet Take 600 mg by mouth 2 (two) times daily before a meal.      . hydrochlorothiazide (HYDRODIURIL) 25 MG tablet Take 25 mg by mouth daily.      . mometasone (NASONEX) 50 MCG/ACT nasal spray Place 2 sprays into the nose daily.      . nitroGLYCERIN (NITROSTAT) 0.4 MG SL tablet Place 0.4 mg under the tongue every 5 (five) minutes as needed. For chest pain      . pravastatin (PRAVACHOL) 40 MG tablet Take 40 mg by mouth at bedtime.      . promethazine (PHENERGAN) 25 MG tablet Take 25 mg by mouth every 6 (six) hours as needed.      . ranitidine (ZANTAC) 150 MG tablet Take 1 tablet (150 mg total) by mouth 2 (two) times daily.  60 tablet  3    Past  Medical History  Diagnosis Date  . HTN (hypertension)   . HLD (hyperlipidemia)   . CAD (coronary artery disease) 11/2004    s/p PCI 9/06 due to ant MI  . Chronic pain syndrome   . Hemorrhage     upper gastrointestional. GI bleed 10/09 due to NSAID  . Depression   . GERD (gastroesophageal reflux disease)   . Anxiety   . Fibromyalgia   . Rhinitis   . Myocardial infarction     x2  . Allergy   . Anemia   . Erosive esophagitis   . Cameron lesion, acute   . Hiatal hernia     Past Surgical History  Procedure Date  . L breast - cyst removed 2002  . Cardiac catheterization   . Upper gastrointestinal endoscopy     Family History  Problem Relation Age of Onset  . Hypertension Mother   . Heart murmur Mother   . Colon polyps Mother   . Colon cancer Maternal Aunt   . Heart disease  Son   . Heart disease Maternal Grandmother   . Diabetes Maternal Aunt   . Esophageal cancer Neg Hx   . Rectal cancer Neg Hx   . Stomach cancer Neg Hx     History   Social History  . Marital Status: Single    Spouse Name: N/A    Number of Children: 1  . Years of Education: N/A   Occupational History  . unemployed    Social History Main Topics  . Smoking status: Never Smoker   . Smokeless tobacco: Never Used   Comment: Divorced, lives with female domestic partner  . Alcohol Use: Yes     occasional  . Drug Use: No  . Sexually Active: Yes    Birth Control/ Protection: Other-see comments     has a female domestic partner   Other Topics Concern  . Not on file   Social History Narrative   Lives alone - divorced; 1 child. Used to live with female domestic partnerUnemployed - disability.     Review of Systems:  All systems reviewed.  They are negative to the above problem except as previously stated.  Vital Signs: BP 144/98  Pulse 80  Ht 5\' 4"  (1.626 m)  Wt 188 lb (85.276 kg)  BMI 32.27 kg/m2  Physical Exam Patient is in NAD HEENT:  Normocephalic, atraumatic. EOMI, PERRLA.  Neck:  JVP is normal.  No bruits.  Lungs: clear to auscultation. No rales no wheezes.  Heart: Regular rate and rhythm. Normal S1, S2. No S3.   No significant murmurs. PMI not displaced.  Abdomen:  Supple, nontender. Normal bowel sounds. No masses. No hepatomegaly.  Extremities:   Good distal pulses throughout. No lower extremity edema.  Musculoskeletal :moving all extremities.  Neuro:   alert and oriented x3.  CN II-XII grossly intact.   Assessment and Plan:  1.  HTN  Not optimally controlled.  I would add Cozaar to regimen.  Patient will need to f/u BP    2.  CAD  No symptoms to sugg active angina  3.  HL  Review meds.

## 2012-01-28 NOTE — Patient Instructions (Signed)
Start Cozaar 50mg  every Perno  BMET lab work in 1 to 2 weeks  Nurse visit for BP check in 6 weeks.

## 2012-02-11 ENCOUNTER — Telehealth: Payer: Self-pay

## 2012-02-11 ENCOUNTER — Other Ambulatory Visit (INDEPENDENT_AMBULATORY_CARE_PROVIDER_SITE_OTHER): Payer: Medicare Other

## 2012-02-11 ENCOUNTER — Other Ambulatory Visit: Payer: Self-pay | Admitting: *Deleted

## 2012-02-11 DIAGNOSIS — I10 Essential (primary) hypertension: Secondary | ICD-10-CM

## 2012-02-11 DIAGNOSIS — E876 Hypokalemia: Secondary | ICD-10-CM

## 2012-02-11 LAB — BASIC METABOLIC PANEL
BUN: 15 mg/dL (ref 6–23)
Calcium: 9.6 mg/dL (ref 8.4–10.5)
Chloride: 102 mEq/L (ref 96–112)
Creatinine, Ser: 1.2 mg/dL (ref 0.4–1.2)
GFR: 60.04 mL/min (ref >60.00)

## 2012-02-11 MED ORDER — POTASSIUM CHLORIDE CRYS ER 20 MEQ PO TBCR: 20.0000 meq | EXTENDED_RELEASE_TABLET | Freq: Two times a day (BID) | ORAL | Status: DC

## 2012-02-11 NOTE — Telephone Encounter (Signed)
Pt's Potassium level is 2.8 (LL). Per Dr. Luellen Pucker, pt. Is advised to take Potassium 20 meq. BID and to repeat BMET in 1 week. Pt. verbalized understanding and states she will have bmet drawn next Friday 11-22. RX sent to [facility]. to fill.

## 2012-02-11 NOTE — Telephone Encounter (Signed)
Ok with me for pt to change to any other PCP

## 2012-02-11 NOTE — Telephone Encounter (Signed)
This patient called requesting to change PCP to Dr. Cathlean Cower, please advise

## 2012-02-14 ENCOUNTER — Other Ambulatory Visit (INDEPENDENT_AMBULATORY_CARE_PROVIDER_SITE_OTHER): Payer: Medicare Other

## 2012-02-14 ENCOUNTER — Other Ambulatory Visit: Payer: Self-pay | Admitting: *Deleted

## 2012-02-14 ENCOUNTER — Telehealth: Payer: Self-pay | Admitting: Internal Medicine

## 2012-02-14 DIAGNOSIS — E876 Hypokalemia: Secondary | ICD-10-CM

## 2012-02-14 DIAGNOSIS — I10 Essential (primary) hypertension: Secondary | ICD-10-CM

## 2012-02-14 LAB — BASIC METABOLIC PANEL
CO2: 29 mEq/L (ref 19–32)
Calcium: 9.2 mg/dL (ref 8.4–10.5)
Creatinine, Ser: 1.3 mg/dL — ABNORMAL HIGH (ref 0.4–1.2)

## 2012-02-14 LAB — MAGNESIUM: Magnesium: 2.2 mg/dL (ref 1.5–2.5)

## 2012-02-14 NOTE — Telephone Encounter (Signed)
As before - ok with me

## 2012-02-14 NOTE — Telephone Encounter (Signed)
As I recall, she did not like something about me in the past and stopped seeing me. I decline. Thx

## 2012-02-14 NOTE — Telephone Encounter (Signed)
Pt wants to switch from Dr Asa Lente to Dr Alain Marion, Please Advise

## 2012-02-15 NOTE — Telephone Encounter (Signed)
Called patient to inform of MD decision and she would like to see if Dr Jenny Reichmann would be willing to accept her as a patient, please advise

## 2012-02-15 NOTE — Telephone Encounter (Signed)
Very sorry, but my "patient panel" is full at this time.

## 2012-02-15 NOTE — Telephone Encounter (Signed)
Lm on vm awaiting return call

## 2012-02-15 NOTE — Telephone Encounter (Signed)
Patient informed. 

## 2012-02-15 NOTE — Telephone Encounter (Signed)
Patient informed Dr. Jenny Reichmann declined as pt. Panel full.

## 2012-02-21 ENCOUNTER — Other Ambulatory Visit: Payer: Self-pay | Admitting: *Deleted

## 2012-02-21 MED ORDER — TRAMADOL HCL 50 MG PO TABS: 50.0000 mg | ORAL_TABLET | Freq: Three times a day (TID) | ORAL | Status: DC | PRN

## 2012-03-09 ENCOUNTER — Ambulatory Visit: Payer: Medicare Other

## 2012-03-10 ENCOUNTER — Ambulatory Visit: Payer: Medicare Other

## 2012-03-13 ENCOUNTER — Encounter: Payer: Self-pay | Admitting: Internal Medicine

## 2012-03-13 ENCOUNTER — Ambulatory Visit (INDEPENDENT_AMBULATORY_CARE_PROVIDER_SITE_OTHER): Payer: Medicare Other | Admitting: Internal Medicine

## 2012-03-13 VITALS — BP 133/87 | HR 78 | Ht 64.0 in | Wt 197.0 lb

## 2012-03-13 DIAGNOSIS — I25810 Atherosclerosis of coronary artery bypass graft(s) without angina pectoris: Secondary | ICD-10-CM

## 2012-03-13 DIAGNOSIS — E785 Hyperlipidemia, unspecified: Secondary | ICD-10-CM

## 2012-03-13 DIAGNOSIS — I10 Essential (primary) hypertension: Secondary | ICD-10-CM

## 2012-03-13 NOTE — Progress Notes (Addendum)
HPI Patient is a 51 yo with a history of CAD (MI in 2006 with PTCA only to LAD)  LHC in 2011 irreg lAD, No signif obstruction of RCA or LCx  LVEF 55 to 60 %  , HL, HTN  Last myoview in May 2013 was normal. I saw her in 2013  She saw Kathleen Argue in 2015 Myovue onrmal 2015  Pt seen by Dr Sharlet Salina for HTN  Pt notes intermitt CP  When laying down or when bending down L sided  With walking she also complains of HA (frontal and back) Allergies  Allergen Reactions  . Clonidine Hydrochloride     REACTION: FATIGUE  . Ibuprofen     REACTION: GI bleed  . Lovastatin     REACTION: pains    Current Outpatient Prescriptions  Medication Sig Dispense Refill  . amitriptyline (ELAVIL) 150 MG tablet Take 1 tablet (150 mg total) by mouth at bedtime.  30 tablet  5  . aspirin 81 MG tablet Take 81 mg by mouth daily.      [name] atenolol (TENORMIN) 50 MG tablet Take 1 tablet (50 mg total) by mouth 2 (two) times daily.  60 tablet  6  . clonazePAM (KLONOPIN) 1 MG tablet Take 1 tablet (1 mg total) by mouth 2 (two) times daily as needed. For anxiety  60 tablet  1  . cyclobenzaprine (FLEXERIL) 10 MG tablet Take 1 tablet (10 mg total) by mouth 3 (three) times daily as needed for muscle spasms.  60 tablet  1  . esomeprazole (NEXIUM) 40 MG capsule Take 40 mg by mouth 2 (two) times daily.      [name] gabapentin (NEURONTIN) 100 MG capsule Take 1 capsule (100 mg total) by mouth 3 (three) times daily.  90 capsule  3  . gemfibrozil (LOPID) 600 MG tablet Take 600 mg by mouth 2 (two) times daily before a meal.      . hydrochlorothiazide (HYDRODIURIL) 25 MG tablet Take 25 mg by mouth daily.      [name] losartan (COZAAR) 50 MG tablet Take 1 tablet (50 mg total) by mouth daily.  30 tablet  6  . mometasone (NASONEX) 50 MCG/ACT nasal spray Place 2 sprays into the nose daily.      . nitroGLYCERIN (NITROSTAT) 0.4 MG SL tablet Place 0.4 mg under the tongue every 5 (five) minutes as needed. For chest pain      . potassium chloride SA (K-DUR,KLOR-CON)  20 MEQ tablet Take 1 tablet (20 mEq total) by mouth 2 (two) times daily.  60 tablet  0  . pravastatin (PRAVACHOL) 40 MG tablet Take 40 mg by mouth at bedtime.      . promethazine (PHENERGAN) 25 MG tablet Take 25 mg by mouth every 6 (six) hours as needed.      . ranitidine (ZANTAC) 150 MG tablet Take 1 tablet (150 mg total) by mouth 2 (two) times daily.  60 tablet  3  . traMADol (ULTRAM) 50 MG tablet Take 1 tablet (50 mg total) by mouth every 8 (eight) hours as needed.  30 tablet  0    Past Medical History  Diagnosis Date  . HTN (hypertension)   . HLD (hyperlipidemia)   . CAD (coronary artery disease) 11/2004    s/p PCI 9/06 due to ant MI  . Chronic pain syndrome   . Hemorrhage     upper gastrointestional. GI bleed 10/09 due to NSAID  . Depression   . GERD (gastroesophageal reflux disease)   .  Anxiety   . Fibromyalgia   . Rhinitis   . Myocardial infarction     x2  . Allergy   . Anemia   . Erosive esophagitis   . Cameron lesion, acute   . Hiatal hernia     Past Surgical History  Procedure Date  . L breast - cyst removed 2002  . Cardiac catheterization   . Upper gastrointestinal endoscopy     Family History  Problem Relation Age of Onset  . Hypertension Mother   . Heart murmur Mother   . Colon polyps Mother   . Colon cancer Maternal Aunt   . Heart disease Son   . Heart disease Maternal Grandmother   . Diabetes Maternal Aunt   . Esophageal cancer Neg Hx   . Rectal cancer Neg Hx   . Stomach cancer Neg Hx     History   Social History  . Marital Status: Single    Spouse Name: N/A    Number of Children: 1  . Years of Education: N/A   Occupational History  . unemployed    Social History Main Topics  . Smoking status: Never Smoker   . Smokeless tobacco: Never Used     Comment: Divorced, lives with female domestic partner  . Alcohol Use: Yes     Comment: occasional  . Drug Use: No  . Sexually Active: Yes    Birth Control/ Protection: Other-see comments      Comment: has a female domestic partner   Other Topics Concern  . Not on file   Social History Narrative   Lives alone - divorced; 1 child. Used to live with female domestic partnerUnemployed - disability.     Review of Systems:  All systems reviewed.  They are negative to the above problem except as previously stated.  Vital Signs: BP 133/87  Pulse 78  Ht 5\' 4"  (1.626 m)  Wt 197 lb (89.359 kg)  BMI 33.82 kg/m2  Physical Exam Patient is in NAD HEENT:  Normocephalic, atraumatic. EOMI, PERRLA.  Neck: JVP is normal.  No bruits.  Lungs: clear to auscultation. No rales no wheezes.  Heart: Regular rate and rhythm. Normal S1, S2. No S3.   No significant murmurs. PMI not displaced.  Abdomen:  Supple, nontender. Normal bowel sounds. No masses. No hepatomegaly.  Extremities:   Good distal pulses throughout. No lower extremity edema.  Musculoskeletal :moving all extremities.  Neuro:   alert and oriented x3.  CN II-XII grossly intact.   Assessment and Plan:  1.  HTN  BP is better but still up  I would increase prinivil/HCTZ to 40/25  F/U in 6 wks   2.  CAD  I am not convinced of angina  CP is more muscular    3.  HL  Continue statin

## 2012-03-20 ENCOUNTER — Telehealth: Payer: Self-pay | Admitting: *Deleted

## 2012-03-20 NOTE — Telephone Encounter (Signed)
Called patient to check her status. Cozaar was stopped last week because patient felt it was causing her to have aches and pains. Dr.Ross advised that if she feels better off Cozaar need to start Norvasc 5mg . Patient states that she still has aches and pains and feels it is due to fibromyalgia. She will have BP checked and call back.

## 2012-03-27 ENCOUNTER — Other Ambulatory Visit: Payer: Self-pay

## 2012-03-27 MED ORDER — TRAMADOL HCL 50 MG PO TABS: 50.0000 mg | ORAL_TABLET | Freq: Three times a day (TID) | ORAL | Status: DC | PRN

## 2012-03-27 MED ORDER — HYDROCHLOROTHIAZIDE 25 MG PO TABS: 25.0000 mg | ORAL_TABLET | Freq: Every day | ORAL | Status: DC

## 2012-03-27 MED ORDER — CLONAZEPAM 1 MG PO TABS: 1.0000 mg | ORAL_TABLET | Freq: Two times a day (BID) | ORAL | Status: DC | PRN

## 2012-03-27 NOTE — Telephone Encounter (Signed)
Please advise in [name]'s absence-last written 01/10/2012 #60 with 1 refill.

## 2012-04-05 ENCOUNTER — Telehealth: Payer: Self-pay | Admitting: *Deleted

## 2012-04-05 NOTE — Telephone Encounter (Signed)
Called [place] [phone] for prior authorization for Nexium.  I spoke with Kennyth Lose at [place], patients prior authorization was approved.. Covered until 02-2013.  Per Kennyth Lose fax conformation will be sent.  Patient and pharmacy notified.

## 2012-04-09 ENCOUNTER — Emergency Department (HOSPITAL_COMMUNITY): Payer: Medicare Other

## 2012-04-09 ENCOUNTER — Encounter (HOSPITAL_COMMUNITY): Payer: Self-pay | Admitting: *Deleted

## 2012-04-09 ENCOUNTER — Emergency Department (HOSPITAL_COMMUNITY)
Admission: EM | Admit: 2012-04-09 | Discharge: 2012-04-10 | Disposition: A | Discharge status: Home / Self Care | Payer: Medicare Other | Attending: Emergency Medicine | Admitting: Emergency Medicine

## 2012-04-09 DIAGNOSIS — Z79899 Other long term (current) drug therapy: Secondary | ICD-10-CM | POA: Insufficient documentation

## 2012-04-09 DIAGNOSIS — G629 Polyneuropathy, unspecified: Secondary | ICD-10-CM

## 2012-04-09 DIAGNOSIS — IMO0001 Reserved for inherently not codable concepts without codable children: Secondary | ICD-10-CM | POA: Insufficient documentation

## 2012-04-09 DIAGNOSIS — G894 Chronic pain syndrome: Secondary | ICD-10-CM | POA: Insufficient documentation

## 2012-04-09 DIAGNOSIS — I252 Old myocardial infarction: Secondary | ICD-10-CM | POA: Insufficient documentation

## 2012-04-09 DIAGNOSIS — F329 Major depressive disorder, single episode, unspecified: Secondary | ICD-10-CM | POA: Insufficient documentation

## 2012-04-09 DIAGNOSIS — E785 Hyperlipidemia, unspecified: Secondary | ICD-10-CM | POA: Insufficient documentation

## 2012-04-09 DIAGNOSIS — Z7982 Long term (current) use of aspirin: Secondary | ICD-10-CM | POA: Insufficient documentation

## 2012-04-09 DIAGNOSIS — K219 Gastro-esophageal reflux disease without esophagitis: Secondary | ICD-10-CM | POA: Insufficient documentation

## 2012-04-09 DIAGNOSIS — E876 Hypokalemia: Secondary | ICD-10-CM | POA: Insufficient documentation

## 2012-04-09 DIAGNOSIS — G589 Mononeuropathy, unspecified: Secondary | ICD-10-CM | POA: Insufficient documentation

## 2012-04-09 DIAGNOSIS — F411 Generalized anxiety disorder: Secondary | ICD-10-CM | POA: Insufficient documentation

## 2012-04-09 DIAGNOSIS — Z8719 Personal history of other diseases of the digestive system: Secondary | ICD-10-CM | POA: Insufficient documentation

## 2012-04-09 DIAGNOSIS — F3289 Other specified depressive episodes: Secondary | ICD-10-CM | POA: Insufficient documentation

## 2012-04-09 DIAGNOSIS — K228 Other specified diseases of esophagus: Secondary | ICD-10-CM | POA: Insufficient documentation

## 2012-04-09 DIAGNOSIS — Z862 Personal history of diseases of the blood and blood-forming organs and certain disorders involving the immune mechanism: Secondary | ICD-10-CM | POA: Insufficient documentation

## 2012-04-09 DIAGNOSIS — K2289 Other specified disease of esophagus: Secondary | ICD-10-CM | POA: Insufficient documentation

## 2012-04-09 DIAGNOSIS — I2510 Atherosclerotic heart disease of native coronary artery without angina pectoris: Secondary | ICD-10-CM | POA: Insufficient documentation

## 2012-04-09 DIAGNOSIS — I10 Essential (primary) hypertension: Secondary | ICD-10-CM | POA: Insufficient documentation

## 2012-04-09 LAB — BASIC METABOLIC PANEL
CO2: 31 mEq/L (ref 19–32)
Chloride: 92 mEq/L — ABNORMAL LOW (ref 96–112)
Glucose, Bld: 128 mg/dL — ABNORMAL HIGH (ref 70–99)
Potassium: 2.9 mEq/L — ABNORMAL LOW (ref 3.5–5.1)
Sodium: 134 mEq/L — ABNORMAL LOW (ref 135–145)

## 2012-04-09 LAB — POCT I-STAT TROPONIN I: Troponin i, poc: 0 ng/mL (ref 0.00–0.08)

## 2012-04-09 LAB — CBC
Hemoglobin: 12.2 g/dL (ref 12.0–15.0)
RBC: 4.16 MIL/uL (ref 3.87–5.11)

## 2012-04-09 MED ORDER — ALBUTEROL SULFATE HFA 108 (90 BASE) MCG/ACT IN AERS
2.0000 | INHALATION_SPRAY | RESPIRATORY_TRACT | Status: DC | PRN
  Administered 2012-04-09: 2 via RESPIRATORY_TRACT
  Filled 2012-04-09: qty 6.7

## 2012-04-09 MED ORDER — POTASSIUM CHLORIDE 10 MEQ/100ML IV SOLN
10.0000 meq | Freq: Once | INTRAVENOUS | Status: AC
Start: 2012-04-09 — End: 2012-04-09
  Administered 2012-04-09: 10 meq via INTRAVENOUS
  Filled 2012-04-09: qty 100

## 2012-04-09 MED ORDER — POTASSIUM CHLORIDE CRYS ER 20 MEQ PO TBCR
40.0000 meq | EXTENDED_RELEASE_TABLET | Freq: Once | ORAL | Status: AC
  Administered 2012-04-09: 40 meq via ORAL
  Filled 2012-04-09: qty 2

## 2012-04-09 MED ORDER — POTASSIUM CHLORIDE ER 10 MEQ PO TBCR: 20.0000 meq | EXTENDED_RELEASE_TABLET | Freq: Two times a day (BID) | ORAL | Status: DC

## 2012-04-09 MED ORDER — MAGNESIUM SULFATE 40 MG/ML IJ SOLN
2.0000 g | Freq: Once | INTRAMUSCULAR | Status: DC
  Filled 2012-04-09: qty 50

## 2012-04-09 MED ORDER — AEROCHAMBER Z-STAT PLUS/MEDIUM MISC
1.0000 | Freq: Once | Status: AC
  Administered 2012-04-10: 1

## 2012-04-09 NOTE — ED Notes (Signed)
Pt c/o R thigh numbness starting suddenly x 30 mins ago. Pt has HTN and hx of MI x 2 w/ cardiac stents. Pt presents w/ no deficits, no droop, no unilateral weakness w/ push-pulls in hands or feet. Pt is visibly short of breath at rest and with mild exertion. Pt has prolonged expiration w/ wheezing and rhonchi throughout lung bases.

## 2012-04-09 NOTE — ED Provider Notes (Addendum)
History     CSN: [id number]  Arrival date & time 04/09/12  1958   None     Chief Complaint  Patient presents with  . Numbness    (Consider location/radiation/quality/duration/timing/severity/associated sxs/prior treatment) HPI History provided by pt.   Pt reports that at approx 7pm when she was going to bed, she noticed numbness in right distal anterolateral thigh.  No associated fever, neck/low back pain, RLE weakness, RLE rash or other neurologic deficits.  Is ambulatory.  Has had an intermittent frontal headache for the past two days but has similar frequently.  Denies head trauma.  Has never had these sx in the past.  Recent illnesses include cough and chest congestion x 1 week only.  Has not had CP or SOB.  Also reports that she woke up 2 mornings ago w/ edema of hands and feet and a lesion on her right cheek that she attributed to a spider bite.   Past Medical History  Diagnosis Date  . HTN (hypertension)   . HLD (hyperlipidemia)   . CAD (coronary artery disease) 11/2004    s/p PCI 9/06 due to ant MI  . Chronic pain syndrome   . Hemorrhage     upper gastrointestional. GI bleed 10/09 due to NSAID  . Depression   . GERD (gastroesophageal reflux disease)   . Anxiety   . Fibromyalgia   . Rhinitis   . Myocardial infarction     x2  . Allergy   . Anemia   . Erosive esophagitis   . Cameron lesion, acute   . Hiatal hernia     Past Surgical History  Procedure Date  . L breast - cyst removed 2002  . Cardiac catheterization   . Upper gastrointestinal endoscopy     Family History  Problem Relation Age of Onset  . Hypertension Mother   . Heart murmur Mother   . Colon polyps Mother   . Colon cancer Maternal Aunt   . Heart disease Son   . Heart disease Maternal Grandmother   . Diabetes Maternal Aunt   . Esophageal cancer Neg Hx   . Rectal cancer Neg Hx   . Stomach cancer Neg Hx     History  Substance Use Topics  . Smoking status: Passive Smoke Exposure - Never  Smoker  . Smokeless tobacco: Never Used     Comment: Divorced, lives with female domestic partner  . Alcohol Use: Yes     Comment: occasional    OB History    Grav Para Term Preterm Abortions TAB SAB Ect Mult Living                  Review of Systems  All other systems reviewed and are negative.    Allergies  Clonidine hydrochloride; Ibuprofen; and Lovastatin  Home Medications   Current Outpatient Rx  Name  Route  Sig  Dispense  Refill  . AMITRIPTYLINE HCL 150 MG PO TABS   Oral   Take 1 tablet (150 mg total) by mouth at bedtime.   30 tablet   5   . ASPIRIN 81 MG PO TABS   Oral   Take 81 mg by mouth daily.         . ATENOLOL 50 MG PO TABS   Oral   Take 1 tablet (50 mg total) by mouth 2 (two) times daily.   60 tablet   6   . CITALOPRAM HYDROBROMIDE 40 MG PO TABS   Oral   Take 40  mg by mouth daily.         [name] CLONAZEPAM 1 MG PO TABS   Oral   Take 1 tablet (1 mg total) by mouth 2 (two) times daily as needed. For anxiety   60 tablet   1   . ESOMEPRAZOLE MAGNESIUM 40 MG PO CPDR   Oral   Take 40 mg by mouth 2 (two) times daily.         [name] GABAPENTIN 100 MG PO CAPS   Oral   Take 1 capsule (100 mg total) by mouth 3 (three) times daily.   90 capsule   3   . GEMFIBROZIL 600 MG PO TABS   Oral   Take 600 mg by mouth 2 (two) times daily before a meal.         . HYDROCHLOROTHIAZIDE 25 MG PO TABS   Oral   Take 1 tablet (25 mg total) by mouth daily.   30 tablet   5   . LOSARTAN POTASSIUM 50 MG PO TABS   Oral   Take 1 tablet (50 mg total) by mouth daily.   30 tablet   6   . PRAVASTATIN SODIUM 40 MG PO TABS   Oral   Take 40 mg by mouth at bedtime.         [name] QUETIAPINE FUMARATE 50 MG PO TABS   Oral   Take 50 mg by mouth 2 (two) times daily.         [name] RANITIDINE HCL 150 MG PO TABS   Oral   Take 1 tablet (150 mg total) by mouth 2 (two) times daily.   60 tablet   3   . TRAMADOL HCL 50 MG PO TABS   Oral   Take 50 mg by mouth every 8  (eight) hours as needed. pain           BP 145/94  Pulse 90  Temp 98.6 F (37 C) (Oral)  Resp 20  Ht 5\' 4"  (1.626 m)  Wt 187 lb (84.823 kg)  BMI 32.10 kg/m2  SpO2 92%  LMP 03/26/2012  Physical Exam  Nursing note and vitals reviewed. Constitutional: She is oriented to person, place, and time. She appears well-developed and well-nourished. No distress.  HENT:  Head: Normocephalic and atraumatic.       1.5cm slightly ulcerated lesion, yellow in color, w/out surrounding erythema, edema or induration and no drainage.    Eyes:       Normal appearance  Neck: Normal range of motion.  Cardiovascular: Normal rate and regular rhythm.   Pulmonary/Chest: Effort normal. No respiratory distress.       Expiratory rhonchi.  Coughing.   Musculoskeletal: She exhibits no edema and no tenderness.       Full, active ROM of RLE.  Entire spine non-tender.   Neurological: She is alert and oriented to person, place, and time.       CN 3-12 intact.  No sensory deficits w/ exception of distal half of anterolateral right thigh.  Can feel light touch slightly but not deep .  5/5 and equal upper and lower extremity strength.  No past pointing.   Skin: Skin is warm and dry. No rash noted.  Psychiatric: She has a normal mood and affect. Her behavior is normal.    ED Course  Procedures (including critical care time)   Date: 04/10/2012  Rate: 89  Rhythm: normal sinus rhythm  QRS Axis: left  Intervals: normal  ST/T Wave abnormalities: nonspecific T  wave changes  Conduction Disutrbances:none  Narrative Interpretation:   Old EKG Reviewed: unchanged   Labs Reviewed  CBC - Abnormal; Notable for the following:    RDW 15.6 (*)     Platelets 470 (*)     All other components within normal limits  BASIC METABOLIC PANEL - Abnormal; Notable for the following:    Sodium 134 (*)     Potassium 2.9 (*)     Chloride 92 (*)     Glucose, Bld 128 (*)     Creatinine, Ser 1.12 (*)     GFR calc non Af Amer 57  (*)     GFR calc Af Amer 66 (*)     All other components within normal limits  PRO B NATRIURETIC PEPTIDE  POCT I-STAT TROPONIN I   Dg Chest Port 1 View  04/09/2012  *RADIOLOGY REPORT*  Clinical Data: Numbness.  History of myocardial infarctions.  PORTABLE CHEST - 1 VIEW  Comparison: 08/24/2011  Findings: Heart size and pulmonary vascularity are normal and the lungs are clear.  No osseous abnormality.  IMPRESSION: Normal chest.   Original Report Authenticated By: Lorriane Shire, M.D.      1. Neuropathy   2. Hypokalemia       MDM  52yo F w/ h/o HTN and CAD presents w/ acute onset numbness isolated to distal half of right anterolateral thigh at 7pm today.  No other neurologic complaints nor focal neuro deficits on exam, no neck/back pain/trauma or spinal tenderness and has a typical frontal headache.  Labs sig for hypokalemia, which patient has a prior history of.  She had a single episode of vomiting yesterday which may be contributory.  She did not tolerate IV potassium in ED today so d/c'd home w/ 20mg  bid x 1 week.  Explained to her that the most likely etiology of her sx is a peripheral neuropathy and referred to neuro for persistent sx.  Low suspicion for stroke based on both history and exam.  Also recommended f/u with her PCP.  Return precautions discussed.        Remer Macho, PA-C 04/09/12 [place], PA-C 04/10/12 [phone]

## 2012-04-10 NOTE — ED Provider Notes (Signed)
Medical screening examination/treatment/procedure(s) were conducted as a shared visit with non-physician practitioner(s) and myself.  I personally evaluated the patient during the encounter On my exam the patient was in no distress.  Beyond the subjective sensory changes, she was n/v intact.  There is little suspicion for acute cerebrovascular event.  The patient was hypokalemic, which is likely contributory.  Carmin Muskrat, MD 04/10/12 0020

## 2012-04-11 NOTE — ED Provider Notes (Signed)
This is a shared encounter.  On my exam this patient with multiple medical problems now presents with concern of right thigh dysesthesia.  The location of her complaint, the absence of objective findings, the absence of dermatomal, or other extension is reassuring for the low suspicion of acute ongoing neurologic compromise.  This may represent a peripheral neuropathy or a peripheral nerve entrapment syndrome, the patient was stable/appropriate for continued evaluation as an outpatient.  Patient's labs notable for demonstration of hypokalemia, for which she received medication.  This may have been contributory, but would be an unusual sole precipitant.  Carmin Muskrat, MD 04/11/12 [phone]

## 2012-05-01 ENCOUNTER — Other Ambulatory Visit: Payer: Self-pay | Admitting: *Deleted

## 2012-05-01 MED ORDER — POTASSIUM CHLORIDE ER 10 MEQ PO TBCR: 20.0000 meq | EXTENDED_RELEASE_TABLET | Freq: Two times a day (BID) | ORAL | Status: DC

## 2012-05-01 MED ORDER — TRAMADOL HCL 50 MG PO TABS: 50.0000 mg | ORAL_TABLET | Freq: Three times a day (TID) | ORAL | Status: DC | PRN

## 2012-05-08 ENCOUNTER — Telehealth: Payer: Self-pay | Admitting: Internal Medicine

## 2012-05-08 NOTE — Telephone Encounter (Signed)
OK - I'll see her again as a pt per her mother's request Thx

## 2012-05-08 NOTE — Telephone Encounter (Signed)
Per patients mother patient no longer wants to see Dr. Asa Lente she would like to switch to Dr. Alain Marion, please advise

## 2012-05-08 NOTE — Telephone Encounter (Signed)
Ok with me - I will transfer records to whichever provider pt feels most comfortable seeing, even if outside our practice

## 2012-05-09 NOTE — Telephone Encounter (Signed)
Left message for patient to call back and schedule with Dr. Alain Marion

## 2012-05-24 ENCOUNTER — Ambulatory Visit: Payer: Medicare Other | Admitting: Internal Medicine

## 2012-05-24 DIAGNOSIS — Z0289 Encounter for other administrative examinations: Secondary | ICD-10-CM

## 2012-06-02 ENCOUNTER — Ambulatory Visit: Payer: Medicare Other | Admitting: Internal Medicine

## 2012-06-08 ENCOUNTER — Telehealth: Payer: Self-pay | Admitting: Internal Medicine

## 2012-06-08 ENCOUNTER — Other Ambulatory Visit: Payer: Self-pay | Admitting: *Deleted

## 2012-06-08 ENCOUNTER — Encounter (HOSPITAL_COMMUNITY): Payer: Self-pay | Admitting: Cardiology

## 2012-06-08 ENCOUNTER — Ambulatory Visit: Payer: Medicare Other | Admitting: Internal Medicine

## 2012-06-08 ENCOUNTER — Emergency Department (HOSPITAL_COMMUNITY)
Admission: EM | Admit: 2012-06-08 | Discharge: 2012-06-08 | Disposition: A | Discharge status: Home / Self Care | Payer: Medicare Other | Attending: Emergency Medicine | Admitting: Emergency Medicine

## 2012-06-08 ENCOUNTER — Emergency Department (HOSPITAL_COMMUNITY): Payer: Medicare Other

## 2012-06-08 DIAGNOSIS — IMO0001 Reserved for inherently not codable concepts without codable children: Secondary | ICD-10-CM | POA: Insufficient documentation

## 2012-06-08 DIAGNOSIS — I10 Essential (primary) hypertension: Secondary | ICD-10-CM | POA: Insufficient documentation

## 2012-06-08 DIAGNOSIS — F17200 Nicotine dependence, unspecified, uncomplicated: Secondary | ICD-10-CM | POA: Insufficient documentation

## 2012-06-08 DIAGNOSIS — Z7982 Long term (current) use of aspirin: Secondary | ICD-10-CM | POA: Insufficient documentation

## 2012-06-08 DIAGNOSIS — I2510 Atherosclerotic heart disease of native coronary artery without angina pectoris: Secondary | ICD-10-CM | POA: Insufficient documentation

## 2012-06-08 DIAGNOSIS — Z79899 Other long term (current) drug therapy: Secondary | ICD-10-CM | POA: Insufficient documentation

## 2012-06-08 DIAGNOSIS — F3289 Other specified depressive episodes: Secondary | ICD-10-CM | POA: Insufficient documentation

## 2012-06-08 DIAGNOSIS — G894 Chronic pain syndrome: Secondary | ICD-10-CM | POA: Insufficient documentation

## 2012-06-08 DIAGNOSIS — R42 Dizziness and giddiness: Secondary | ICD-10-CM | POA: Insufficient documentation

## 2012-06-08 DIAGNOSIS — Z0289 Encounter for other administrative examinations: Secondary | ICD-10-CM

## 2012-06-08 DIAGNOSIS — E785 Hyperlipidemia, unspecified: Secondary | ICD-10-CM | POA: Insufficient documentation

## 2012-06-08 DIAGNOSIS — R51 Headache: Secondary | ICD-10-CM | POA: Insufficient documentation

## 2012-06-08 DIAGNOSIS — K219 Gastro-esophageal reflux disease without esophagitis: Secondary | ICD-10-CM | POA: Insufficient documentation

## 2012-06-08 DIAGNOSIS — I252 Old myocardial infarction: Secondary | ICD-10-CM | POA: Insufficient documentation

## 2012-06-08 DIAGNOSIS — Z9861 Coronary angioplasty status: Secondary | ICD-10-CM | POA: Insufficient documentation

## 2012-06-08 DIAGNOSIS — Z8719 Personal history of other diseases of the digestive system: Secondary | ICD-10-CM | POA: Insufficient documentation

## 2012-06-08 LAB — CBC
HCT: 37.7 % (ref 36.0–46.0)
RDW: 15.3 % (ref 11.5–15.5)
WBC: 10 10*3/uL (ref 4.0–10.5)

## 2012-06-08 LAB — BASIC METABOLIC PANEL
Chloride: 101 mEq/L (ref 96–112)
Creatinine, Ser: 1.12 mg/dL — ABNORMAL HIGH (ref 0.50–1.10)
GFR calc Af Amer: 65 mL/min — ABNORMAL LOW (ref >90)
Potassium: 3.5 mEq/L (ref 3.5–5.1)

## 2012-06-08 LAB — POCT I-STAT TROPONIN I

## 2012-06-08 MED ORDER — SODIUM CHLORIDE 0.9 % IV BOLUS (SEPSIS)
1000.0000 mL | Freq: Once | INTRAVENOUS | Status: AC
  Administered 2012-06-08: 1000 mL via INTRAVENOUS

## 2012-06-08 MED ORDER — METOCLOPRAMIDE HCL 5 MG/ML IJ SOLN
10.0000 mg | Freq: Once | INTRAMUSCULAR | Status: AC
  Administered 2012-06-08: 10 mg via INTRAVENOUS
  Filled 2012-06-08: qty 2

## 2012-06-08 NOTE — Telephone Encounter (Signed)
Pt has requested to not see me and I agree with her request - therefore, pt will not be rescheduled with me -  Defer to Dr Mamie Nick re: ?dismiss from practice

## 2012-06-08 NOTE — Telephone Encounter (Signed)
Patient Information:  Caller Name: Inez Catalina  Phone: [phone]  Patient: Hayley Miller, Hayley Miller  Gender: Female  DOB: 1960/05/30  Age: 52 Years  PCP: Plotnikov, Alex (Adults only)  Pregnant: No  Office Follow Up:  Does the office need to follow up with this patient?: No  Instructions For The Office: N/A  RN Note:  Not sexually active. Severe headache rated 10/10 with rigidity of muscles in back, arms and legs.  History of hypertension.  Reports "backing up and falling forward, walking into stuff." Disoriented to date and time.  Currently in car at pharmacy.  Advised to call 911 now for disorientation with severe headache, muscle rigidity and history of hypertension. Asking if will be allowed to be seen in the office in the future.  Advised to follow up with office based on ED  Discharge instructions.   Symptoms  Reason For Call & Symptoms: Emergent Call: Asking for appointment for severe headache.  Pain rated > 10.  Stiff muscles in back, legs an arms. Missed appointment this morning due to overslept  Reviewed Health History In EMR: Yes  Reviewed Medications In EMR: Yes  Reviewed Allergies In EMR: Yes  Reviewed Surgeries / Procedures: Yes  Date of Onset of Symptoms: 06/04/2012 OB / GYN:  LMP: 06/07/2012  Guideline(s) Used:  Headache  Disposition Per Guideline:   Call EMS 911 Now  Reason For Disposition Reached:   Difficult to awaken or acting confused (e.g., disoriented, slurred speech)  Advice Given:  N/A  RN Overrode Recommendation:  Go To ED  Alger Memos if will go to ED since already in car or call 911; advised 911 to expedite care.

## 2012-06-08 NOTE — ED Notes (Signed)
MD at bedside. 

## 2012-06-08 NOTE — Telephone Encounter (Signed)
This patient phoned [facility] wanting to set up as a new patient.  Pt states that your practice will not see her anymore, because she overslept this morning and missed her appt..  Pt also states this is an emergency, she takes and needs nitroglycerin  asap.  Will you please follow up with patient?  Thank you.

## 2012-06-08 NOTE — ED Provider Notes (Signed)
History     CSN: [id number]  Arrival date & time 06/08/12  1542   Chief Complaint  Patient presents with  . Headache   HPI  52 y/o female with history of CAD, HTN, HLD who presents with cc of headache and dizziness. The patient states that her symptoms began approximately 4 days ago. The patient states that today she felt "disoriented" and was "walking into things". She states she feels like all of her muscles are "stiff" throughout her entire body. She states her pain is currently a 6/10. The pain is located in the back of her head and radiates to the front. She states she has been having almost daily headaches since an accident one year ago. She states that within the last few weeks she has been waking up with headaches.   Past Medical History  Diagnosis Date  . HTN (hypertension)   . HLD (hyperlipidemia)   . CAD (coronary artery disease) 11/2004    s/p PCI 9/06 due to ant MI  . Chronic pain syndrome   . Hemorrhage     upper gastrointestional. GI bleed 10/09 due to NSAID  . Depression   . GERD (gastroesophageal reflux disease)   . Anxiety   . Fibromyalgia   . Rhinitis   . Myocardial infarction     x2  . Allergy   . Anemia   . Erosive esophagitis   . Cameron lesion, acute   . Hiatal hernia     Past Surgical History  Procedure Laterality Date  . L breast - cyst removed  2002  . Cardiac catheterization    . Upper gastrointestinal endoscopy      Family History  Problem Relation Age of Onset  . Hypertension Mother   . Heart murmur Mother   . Colon polyps Mother   . Colon cancer Maternal Aunt   . Heart disease Son   . Heart disease Maternal Grandmother   . Diabetes Maternal Aunt   . Esophageal cancer Neg Hx   . Rectal cancer Neg Hx   . Stomach cancer Neg Hx     History  Substance Use Topics  . Smoking status: Passive Smoke Exposure - Never Smoker  . Smokeless tobacco: Never Used     Comment: Divorced, lives with female domestic partner  . Alcohol Use: Yes   Comment: occasional    OB History   Grav Para Term Preterm Abortions TAB SAB Ect Mult Living                  Review of Systems  Constitutional: Negative for fever and chills.  HENT: Negative for congestion and rhinorrhea.   Eyes: Negative for visual disturbance.  Respiratory: Negative for shortness of breath.   Cardiovascular: Negative for chest pain.  Gastrointestinal: Negative for nausea and vomiting.  Genitourinary: Negative for dysuria and frequency.  Skin: Negative for rash.  Neurological: Positive for dizziness and headaches.  All other systems reviewed and are negative.    Allergies  Clonidine hydrochloride; Ibuprofen; and Lovastatin  Home Medications   Current Outpatient Rx  Name  Route  Sig  Dispense  Refill  . amitriptyline (ELAVIL) 150 MG tablet   Oral   Take 1 tablet (150 mg total) by mouth at bedtime.   30 tablet   5   . aspirin EC 81 MG tablet   Oral   Take 81 mg by mouth daily.         [name] atenolol (TENORMIN) 50 MG tablet  Oral   Take 1 tablet (50 mg total) by mouth 2 (two) times daily.   60 tablet   6   . citalopram (CELEXA) 40 MG tablet   Oral   Take 40 mg by mouth daily.         . clonazePAM (KLONOPIN) 1 MG tablet   Oral   Take 1 tablet (1 mg total) by mouth 2 (two) times daily as needed. For anxiety   60 tablet   1   . esomeprazole (NEXIUM) 40 MG capsule   Oral   Take 40 mg by mouth 2 (two) times daily.         [name] gabapentin (NEURONTIN) 100 MG capsule   Oral   Take 1 capsule (100 mg total) by mouth 3 (three) times daily.   90 capsule   3   . gemfibrozil (LOPID) 600 MG tablet   Oral   Take 600 mg by mouth 2 (two) times daily before a meal.         . hydrochlorothiazide (HYDRODIURIL) 25 MG tablet   Oral   Take 1 tablet (25 mg total) by mouth daily.   30 tablet   5   . losartan (COZAAR) 50 MG tablet   Oral   Take 1 tablet (50 mg total) by mouth daily.   30 tablet   6   . potassium chloride (K-DUR) 10 MEQ tablet    Oral   Take 2 tablets (20 mEq total) by mouth 2 (two) times daily.   60 tablet   0     Keep 05/24/12 appt for additional refills   . pravastatin (PRAVACHOL) 40 MG tablet   Oral   Take 40 mg by mouth at bedtime.         [name] QUEtiapine (SEROQUEL) 50 MG tablet   Oral   Take 50 mg by mouth 2 (two) times daily.         . ranitidine (ZANTAC) 150 MG tablet   Oral   Take 1 tablet (150 mg total) by mouth 2 (two) times daily.   60 tablet   3   . traMADol (ULTRAM) 50 MG tablet   Oral   Take 1 tablet (50 mg total) by mouth every 8 (eight) hours as needed. pain   30 tablet   0     Keep 05/24/12 appt for additional refills     BP 119/80  Pulse 81  Temp(Src) 97.7 F (36.5 C) (Oral)  Resp 16  Ht 5\' 4"  (1.626 m)  Wt 180 lb (81.647 kg)  BMI 30.88 kg/m2  SpO2 98%  LMP 06/07/2012  Physical Exam  Nursing note and vitals reviewed. Constitutional: She is oriented to person, place, and time. She appears well-developed and well-nourished. No distress.  HENT:  Head: Normocephalic and atraumatic.  Mouth/Throat: No oropharyngeal exudate.  Eyes: Conjunctivae and EOM are normal. Pupils are equal, round, and reactive to light.  Neck: Normal range of motion. Neck supple.  Cardiovascular: Normal rate and regular rhythm.  Exam reveals no gallop and no friction rub.   No murmur heard. Pulmonary/Chest: Effort normal and breath sounds normal.  Abdominal: Soft. She exhibits no distension. There is no tenderness.  Musculoskeletal: Normal range of motion. She exhibits no edema and no tenderness.  Neurological: She is alert and oriented to person, place, and time. She has normal strength and normal reflexes. No cranial nerve deficit or sensory deficit. She displays a negative Romberg sign. GCS eye subscore is 4. GCS verbal subscore  is 5. GCS motor subscore is 6.  Normal gait. Normal finger to nose. Normal rapid alternating hand movements.  Skin: Skin is warm and dry.  Psychiatric: She has a normal  mood and affect.    ED Course  Procedures (including critical care time)  Labs Reviewed  BASIC METABOLIC PANEL - Abnormal; Notable for the following:    Creatinine, Ser 1.12 (*)    GFR calc non Af Amer 56 (*)    GFR calc Af Amer 65 (*)    All other components within normal limits  CBC - Abnormal; Notable for the following:    Platelets 549 (*)    All other components within normal limits  POCT I-STAT TROPONIN I   Ct Head Wo Contrast  06/08/2012  *RADIOLOGY REPORT*  Clinical Data:  Headache and dizziness  CT HEAD WITHOUT CONTRAST  Technique:  Contiguous axial images were obtained from the base of the skull through the vertex without contrast  Comparison:  09/03/2008  Findings:  The brain has a normal appearance without evidence for hemorrhage, acute infarction, hydrocephalus, or mass lesion.  There is no extra axial fluid collection.  The skull and paranasal sinuses are normal.  IMPRESSION: Normal CT of the head without contrast.   Original Report Authenticated By: Carl Best, M.D.    No diagnosis found.  MDM  52 y/o female with history of CAD, HTN, HLD who presents with cc of headache and dizziness. Neurologically intact. Normal Gait. CT head normal. EKG without acute ischemia. Labs c/w baseline. Headache improved after migraine cocktail. Don't suspect CVA,  Meningitis, cerebral venous thrombosis. Likely migraine headache. The patient was discharged home with pcp follow up.         Donita Brooks, MD 06/08/12 2218

## 2012-06-08 NOTE — ED Notes (Signed)
Pt reports that since Sunday she has had a generalized headache with some dizziness. Also states she has felt disoriented, and "walking into stuff" at home. Reports she has had some n/v, denies any chest pain or SOB at this time. Pt with cardiac hx.

## 2012-06-08 NOTE — Telephone Encounter (Signed)
Patient's daughter Stephan Minister) called our office requesting an appt to be seen by one of our providers today.  I spoke with the daughter and informed her that we are unable to schedule an appointment for the patient since she is currently an [facility] patient and had an appointment this morning to see Dr. Alain Marion..    Daughter states she contacted the [facility] office and was told she is not able to schedule an appointment their due to pattern of "no shows".  Please advise if patient will be discharged from practice?  With patient's no show history none of the [facility] providers will accept patient as a transfer.  Please follow up with patient.

## 2012-06-09 MED ORDER — NITROGLYCERIN 0.4 MG SL SUBL: 0.4000 mg | SUBLINGUAL_TABLET | SUBLINGUAL | Status: DC | PRN

## 2012-06-09 NOTE — Telephone Encounter (Signed)
I will not see the pt. Thx

## 2012-06-09 NOTE — ED Provider Notes (Signed)
I saw and evaluated the patient, reviewed the resident's note and I agree with the findings and plan. Pt c/o dull frontal headache earlier. Currently resolved w rx. No temporal or sinus tenderness. Neck supple, no rigidity. Ambulates w steady gait.   Mirna Mires, MD 06/09/12 1120

## 2012-06-09 NOTE — Telephone Encounter (Signed)
x

## 2012-06-09 NOTE — Telephone Encounter (Signed)
Ok NTG

## 2012-06-09 NOTE — Addendum Note (Signed)
Addended by: Cassandria Anger on: 06/09/2012 01:24 PM   Modules accepted: Orders

## 2012-06-10 NOTE — Telephone Encounter (Signed)
Noted - ER visit reviewed - no acute med issue identified thanks

## 2012-06-12 ENCOUNTER — Other Ambulatory Visit: Payer: Self-pay | Admitting: Emergency Medicine

## 2012-06-12 NOTE — Telephone Encounter (Signed)
Pharmacy is requesting a refill.  Please advised

## 2012-07-03 ENCOUNTER — Other Ambulatory Visit: Payer: Self-pay | Admitting: *Deleted

## 2012-07-03 MED ORDER — PRAVASTATIN SODIUM 40 MG PO TABS: 40.0000 mg | ORAL_TABLET | Freq: Every day | ORAL | Status: DC

## 2012-07-03 NOTE — Telephone Encounter (Signed)
Rcd fax from [facility] for refill of Pravastatin.

## 2012-07-05 ENCOUNTER — Encounter (HOSPITAL_COMMUNITY): Payer: Self-pay | Admitting: Psychiatry

## 2012-07-05 ENCOUNTER — Ambulatory Visit (INDEPENDENT_AMBULATORY_CARE_PROVIDER_SITE_OTHER): Payer: Medicare Other | Admitting: Psychiatry

## 2012-07-05 VITALS — BP 134/96 | HR 75 | Ht 64.0 in | Wt 196.6 lb

## 2012-07-05 DIAGNOSIS — F3160 Bipolar disorder, current episode mixed, unspecified: Secondary | ICD-10-CM

## 2012-07-05 DIAGNOSIS — F3130 Bipolar disorder, current episode depressed, mild or moderate severity, unspecified: Secondary | ICD-10-CM

## 2012-07-05 MED ORDER — LAMOTRIGINE 25 MG PO TABS: ORAL_TABLET | ORAL | Status: DC
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
## 2012-07-20 ENCOUNTER — Other Ambulatory Visit: Payer: Self-pay | Admitting: Internal Medicine

## 2012-07-20 DIAGNOSIS — D242 Benign neoplasm of left breast: Secondary | ICD-10-CM

## 2012-07-27 ENCOUNTER — Ambulatory Visit (INDEPENDENT_AMBULATORY_CARE_PROVIDER_SITE_OTHER): Payer: No Typology Code available for payment source | Admitting: Psychiatry

## 2012-07-27 ENCOUNTER — Encounter (HOSPITAL_COMMUNITY): Payer: Self-pay | Admitting: Psychiatry

## 2012-07-27 VITALS — BP 149/93 | HR 74 | Ht 64.0 in | Wt 189.6 lb

## 2012-07-27 DIAGNOSIS — F3160 Bipolar disorder, current episode mixed, unspecified: Secondary | ICD-10-CM

## 2012-07-27 DIAGNOSIS — F3130 Bipolar disorder, current episode depressed, mild or moderate severity, unspecified: Secondary | ICD-10-CM

## 2012-07-27 MED ORDER — HYDROXYZINE PAMOATE 25 MG PO CAPS: 25.0000 mg | ORAL_CAPSULE | Freq: Two times a day (BID) | ORAL | Status: DC | PRN

## 2012-07-27 MED ORDER — LAMOTRIGINE 100 MG PO TABS: 100.0000 mg | ORAL_TABLET | Freq: Every day | ORAL | Status: DC

## 2012-07-27 MED ORDER — QUETIAPINE FUMARATE 200 MG PO TABS: 200.0000 mg | ORAL_TABLET | Freq: Every day | ORAL | Status: DC
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
## 2012-08-01 ENCOUNTER — Ambulatory Visit (INDEPENDENT_AMBULATORY_CARE_PROVIDER_SITE_OTHER): Payer: No Typology Code available for payment source | Admitting: Psychiatry

## 2012-08-01 DIAGNOSIS — F329 Major depressive disorder, single episode, unspecified: Secondary | ICD-10-CM

## 2012-08-01 DIAGNOSIS — F3289 Other specified depressive episodes: Secondary | ICD-10-CM

## 2012-08-01 DIAGNOSIS — F411 Generalized anxiety disorder: Secondary | ICD-10-CM

## 2012-08-02 ENCOUNTER — Ambulatory Visit
Admission: RE | Admit: 2012-08-02 | Discharge: 2012-08-02 | Disposition: A | Discharge status: Home / Self Care | Payer: Medicare Other | Source: Ambulatory Visit | Attending: Internal Medicine | Admitting: Internal Medicine

## 2012-08-02 ENCOUNTER — Encounter (HOSPITAL_COMMUNITY): Payer: Self-pay | Admitting: Psychiatry

## 2012-08-02 DIAGNOSIS — D242 Benign neoplasm of left breast: Secondary | ICD-10-CM
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
## 2012-08-24 ENCOUNTER — Ambulatory Visit (HOSPITAL_COMMUNITY): Payer: Self-pay | Admitting: Psychiatry

## 2012-09-01 ENCOUNTER — Telehealth: Payer: Self-pay | Admitting: Hematology & Oncology

## 2012-09-01 NOTE — Telephone Encounter (Signed)
LVOM FOR PT TO RETURN CALL IN RE NP APPT.  °

## 2012-09-07 ENCOUNTER — Ambulatory Visit (HOSPITAL_COMMUNITY): Payer: No Typology Code available for payment source | Admitting: Psychiatry

## 2012-09-07 NOTE — Progress Notes (Signed)
   THERAPIST PROGRESS NOTE  Session Time: 2:00-2:50  Participation Level: Active  Behavioral Response: CasualAlertDysphoric  Type of Therapy: Individual Therapy  Treatment Goals addressed: stress management  Interventions: CBT  Summary: Hayley Miller is a 52 y.o. female who presents with depression.   Suicidal/Homicidal: Nowithout intent/plan  Therapist Response: Pt. Explored relationship between stress and fibromialgia history. Pt. Participated in guided visualization exercise.  Plan: Return again in 2-3 weeks.  Diagnosis: Axis I: Depressive Disorder NOS    Axis II: No diagnosis    Renford Dills 09/07/2012

## 2012-09-18 ENCOUNTER — Ambulatory Visit (INDEPENDENT_AMBULATORY_CARE_PROVIDER_SITE_OTHER): Payer: No Typology Code available for payment source | Admitting: Psychiatry

## 2012-09-18 ENCOUNTER — Ambulatory Visit (HOSPITAL_COMMUNITY): Payer: Self-pay | Admitting: Psychiatry

## 2012-09-18 ENCOUNTER — Encounter (HOSPITAL_COMMUNITY): Payer: Self-pay | Admitting: Psychiatry

## 2012-09-18 VITALS — BP 159/109 | HR 86 | Ht 64.0 in | Wt 179.4 lb

## 2012-09-18 DIAGNOSIS — F3160 Bipolar disorder, current episode mixed, unspecified: Secondary | ICD-10-CM

## 2012-09-18 DIAGNOSIS — F3130 Bipolar disorder, current episode depressed, mild or moderate severity, unspecified: Secondary | ICD-10-CM
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
## 2012-09-25 ENCOUNTER — Ambulatory Visit (INDEPENDENT_AMBULATORY_CARE_PROVIDER_SITE_OTHER): Payer: No Typology Code available for payment source | Admitting: Psychiatry

## 2012-09-25 ENCOUNTER — Encounter (HOSPITAL_COMMUNITY): Payer: Self-pay | Admitting: Psychiatry

## 2012-09-25 DIAGNOSIS — F411 Generalized anxiety disorder: Secondary | ICD-10-CM

## 2012-09-25 DIAGNOSIS — F329 Major depressive disorder, single episode, unspecified: Secondary | ICD-10-CM

## 2012-09-25 NOTE — Progress Notes (Signed)
   THERAPIST PROGRESS NOTE  Session Time: 2:30-3:20  Participation Level: Active  Behavioral Response: CasualAlertDysphoric  Type of Therapy: Individual Therapy  Treatment Goals addressed: coping skills, stress management  Interventions: CBT  Summary: Hayley Miller is a 53 y.o. female who presents with depression and anxiety.   Suicidal/Homicidal: Nowithout intent/plan  Therapist Response: Pt. Continues with conflict of adult son who is living in her home with his family. Pt. Continues to feel guilty for asking him to leave even though she is unhappy living in her mother's home. Explored themes related to personal responsibility and caretaking, importance of opening self to developing new relationships. Introduced Architectural technologist.  Plan: Return again in 2 weeks.  Diagnosis: Axis I: Anxiety Disorder NOS and Depressive Disorder NOS    Axis II: No diagnosis    Renford Dills 09/25/2012

## 2012-10-07 ENCOUNTER — Other Ambulatory Visit: Payer: Self-pay | Admitting: Gastroenterology

## 2012-10-09 NOTE — Telephone Encounter (Signed)
NEEDS OFFICE VISIT.

## 2012-10-11 ENCOUNTER — Ambulatory Visit (INDEPENDENT_AMBULATORY_CARE_PROVIDER_SITE_OTHER): Payer: No Typology Code available for payment source | Admitting: Psychiatry

## 2012-10-11 ENCOUNTER — Encounter (HOSPITAL_COMMUNITY): Payer: Self-pay | Admitting: Psychiatry

## 2012-10-11 DIAGNOSIS — F329 Major depressive disorder, single episode, unspecified: Secondary | ICD-10-CM

## 2012-10-11 DIAGNOSIS — F411 Generalized anxiety disorder: Secondary | ICD-10-CM

## 2012-10-11 NOTE — Progress Notes (Signed)
   THERAPIST PROGRESS NOTE  Session Time: 12:30-1:10  Participation Level: Minimal  Behavioral Response: CasualAlertdysphoric  Type of Therapy: Individual Therapy  Treatment Goals addressed: stress management, emotion regulation  Interventions: CBT  Summary: Hayley Miller is a 52 y.o. female who presents with depression, anxiety.   Suicidal/Homicidal: Nowithout intent/plan  Therapist Response: Pt. Reported that she wanted to stop counseling and expected today's session to be final session. Pt. Stated that she does not like talking about her problems. Pt. Reported that son continues to live in her house, she has accepted the situation and will allow him continue to live there temporarily for the benefit of her granddaughter. Pt. Participated in guided meditation.  Plan: Return again in 2 weeks.  Diagnosis: Axis I: Depressive Disorder NOS    Axis II: No diagnosis    Renford Dills 10/11/2012

## 2012-11-15 ENCOUNTER — Telehealth: Payer: Self-pay | Admitting: Hematology and Oncology

## 2012-11-15 ENCOUNTER — Telehealth: Payer: Self-pay

## 2012-11-15 NOTE — Telephone Encounter (Signed)
S/W PT IN RE TO NP APPT 09/10 @ 3 W/DR. [place] PACKET MAILED.

## 2012-11-15 NOTE — Telephone Encounter (Signed)
LVOM FOR KATIE ABOUT NP APPT 09/10 @ 3 W/DR. VICTOR, REQUESTED RECORDS BE REFAX TO [phone].

## 2012-11-15 NOTE — Telephone Encounter (Signed)
C/D 11/15/12 for appt. 12/06/12

## 2012-11-20 ENCOUNTER — Ambulatory Visit (HOSPITAL_COMMUNITY): Payer: Self-pay | Admitting: Psychiatry

## 2012-11-22 ENCOUNTER — Other Ambulatory Visit: Payer: Self-pay | Admitting: Gastroenterology

## 2012-12-05 ENCOUNTER — Telehealth: Payer: Self-pay | Admitting: Internal Medicine

## 2012-12-05 NOTE — Telephone Encounter (Signed)
CALLED PT TO R/S TO 10/06 @ 2:30 W/DR. CHISM PT CONFIRMED.

## 2012-12-05 NOTE — Telephone Encounter (Signed)
S/w pt and gave new time to 2 w/Dr. Juliann Mule. PT confirmed

## 2012-12-06 ENCOUNTER — Ambulatory Visit: Payer: Self-pay

## 2012-12-14 ENCOUNTER — Other Ambulatory Visit: Payer: Self-pay | Admitting: Obstetrics and Gynecology

## 2012-12-14 DIAGNOSIS — N6310 Unspecified lump in the right breast, unspecified quadrant: Secondary | ICD-10-CM

## 2012-12-29 ENCOUNTER — Other Ambulatory Visit: Payer: Self-pay | Admitting: Internal Medicine

## 2013-01-01 ENCOUNTER — Telehealth: Payer: Self-pay | Admitting: Internal Medicine

## 2013-01-01 ENCOUNTER — Ambulatory Visit: Payer: Self-pay | Admitting: Lab

## 2013-01-01 ENCOUNTER — Ambulatory Visit: Payer: Self-pay

## 2013-01-01 ENCOUNTER — Other Ambulatory Visit: Payer: Self-pay | Admitting: Internal Medicine

## 2013-01-01 DIAGNOSIS — D649 Anemia, unspecified: Secondary | ICD-10-CM

## 2013-01-01 NOTE — Telephone Encounter (Signed)
lvm for pt regarding to lab add on today.Hayley KitchenMarland Miller

## 2013-01-02 ENCOUNTER — Ambulatory Visit
Admission: RE | Admit: 2013-01-02 | Discharge: 2013-01-02 | Disposition: A | Discharge status: Home / Self Care | Payer: No Typology Code available for payment source | Source: Ambulatory Visit | Attending: Obstetrics and Gynecology | Admitting: Obstetrics and Gynecology

## 2013-01-02 ENCOUNTER — Other Ambulatory Visit: Payer: Self-pay | Admitting: Obstetrics and Gynecology

## 2013-01-02 DIAGNOSIS — N6310 Unspecified lump in the right breast, unspecified quadrant: Secondary | ICD-10-CM

## 2013-02-21 ENCOUNTER — Inpatient Hospital Stay (HOSPITAL_COMMUNITY): Admission: RE | Admit: 2013-02-21 | Payer: Self-pay | Source: Ambulatory Visit

## 2013-03-30 ENCOUNTER — Other Ambulatory Visit: Payer: Self-pay | Admitting: Internal Medicine

## 2013-04-11 ENCOUNTER — Other Ambulatory Visit: Payer: Self-pay | Admitting: Obstetrics and Gynecology

## 2013-04-23 NOTE — Patient Instructions (Addendum)
   Your procedure is scheduled on:  Tuesday, Feb 10  Enter through the [employer] of [facility] at:  6 AM Pick up the phone at the desk and dial [phone] and inform us of your arrival.  Please call this number if you have any problems the morning of surgery: [phone]  Remember: Do not eat or drink after midnight:  Monday Take these medicines the morning of surgery with a SIP OF WATER:  Do not wear jewelry, make-up, or FINGER nail polish No metal in your hair or on your body. Do not wear lotions, powders, perfumes.  You may wear deodorant.  Do not bring valuables to the hospital. Contacts, dentures or bridgework may not be worn into surgery.  Leave suitcase in the car. After Surgery it may be brought to your room. For patients being admitted to the hospital, checkout time is 11:00am the [place] of discharge.

## 2013-04-24 ENCOUNTER — Telehealth: Payer: Self-pay | Admitting: Internal Medicine

## 2013-04-24 ENCOUNTER — Encounter (HOSPITAL_COMMUNITY): Payer: Self-pay | Admitting: Pharmacist

## 2013-04-24 ENCOUNTER — Encounter (HOSPITAL_COMMUNITY)
Admission: RE | Admit: 2013-04-24 | Discharge: 2013-04-24 | Disposition: A | Discharge status: Home / Self Care | Payer: Medicare HMO | Source: Ambulatory Visit | Attending: Obstetrics and Gynecology | Admitting: Obstetrics and Gynecology

## 2013-04-24 DIAGNOSIS — Z01812 Encounter for preprocedural laboratory examination: Secondary | ICD-10-CM | POA: Insufficient documentation

## 2013-04-24 DIAGNOSIS — Z01818 Encounter for other preprocedural examination: Secondary | ICD-10-CM | POA: Insufficient documentation

## 2013-04-24 LAB — CBC
HCT: 35 % — ABNORMAL LOW (ref 36.0–46.0)
Hemoglobin: 10.7 g/dL — ABNORMAL LOW (ref 12.0–15.0)
MCH: 22.9 pg — ABNORMAL LOW (ref 26.0–34.0)
MCHC: 30.6 g/dL (ref 30.0–36.0)
MCV: 74.8 fL — ABNORMAL LOW (ref 78.0–100.0)
Platelets: 555 K/uL — ABNORMAL HIGH (ref 150–400)
RBC: 4.68 MIL/uL (ref 3.87–5.11)
RDW: 18.8 % — ABNORMAL HIGH (ref 11.5–15.5)
WBC: 7.8 K/uL (ref 4.0–10.5)

## 2013-04-24 LAB — BASIC METABOLIC PANEL
BUN: 11 mg/dL (ref 6–23)
CALCIUM: 9.6 mg/dL (ref 8.4–10.5)
CHLORIDE: 104 meq/L (ref 96–112)
CO2: 27 meq/L (ref 19–32)
Creatinine, Ser: 1.03 mg/dL (ref 0.50–1.10)
GFR calc Af Amer: 72 mL/min — ABNORMAL LOW (ref >90)
GFR calc non Af Amer: 62 mL/min — ABNORMAL LOW (ref >90)
GLUCOSE: 99 mg/dL (ref 70–99)
Potassium: 4.4 mEq/L (ref 3.7–5.3)
SODIUM: 141 meq/L (ref 137–147)

## 2013-04-24 NOTE — Pre-Procedure Instructions (Signed)
Informed Dr Rudean Curt of patient's BP=170/122, P= 69 and 171/111, P=74 at today's PAT appt.  Patient needs cardiac clearance and blood pressure under controlled in order to have the surgical procedure.  Notified Stacy at MD's office.  Stacy to call Dr Dorris Carnes, patient's cardiologist to set her up with an appt. and will call the patient.  Patient notified that Marzetta Board would be calling her with appt. date and time with cardiologist.

## 2013-04-24 NOTE — Telephone Encounter (Signed)
Left message for pt to call, she has not been seen since 2013. She will nedd to call and make an appointment for clearance. Stacy at [facility] aware.

## 2013-04-24 NOTE — Telephone Encounter (Signed)
New Message  Gay Filler with [facility] called. Requests a Cardiac Clearance for Feb 11 procedure. Pt will have a laparoscopic vaginal hysterectomy. Please call.

## 2013-04-30 NOTE — Telephone Encounter (Signed)
Follow up scheduled

## 2013-05-01 ENCOUNTER — Encounter: Payer: Self-pay | Admitting: Physician Assistant

## 2013-05-01 ENCOUNTER — Ambulatory Visit (INDEPENDENT_AMBULATORY_CARE_PROVIDER_SITE_OTHER): Payer: Medicare HMO | Admitting: Physician Assistant

## 2013-05-01 VITALS — BP 175/99 | HR 81 | Ht 64.0 in | Wt 175.0 lb

## 2013-05-01 DIAGNOSIS — R0602 Shortness of breath: Secondary | ICD-10-CM

## 2013-05-01 DIAGNOSIS — R079 Chest pain, unspecified: Secondary | ICD-10-CM

## 2013-05-01 DIAGNOSIS — I2510 Atherosclerotic heart disease of native coronary artery without angina pectoris: Secondary | ICD-10-CM

## 2013-05-01 DIAGNOSIS — Z01810 Encounter for preprocedural cardiovascular examination: Secondary | ICD-10-CM

## 2013-05-01 DIAGNOSIS — E785 Hyperlipidemia, unspecified: Secondary | ICD-10-CM

## 2013-05-01 DIAGNOSIS — I10 Essential (primary) hypertension: Secondary | ICD-10-CM

## 2013-05-01 MED ORDER — AMLODIPINE BESYLATE 5 MG PO TABS: 5.0000 mg | ORAL_TABLET | Freq: Every day | ORAL | Status: DC

## 2013-05-01 NOTE — Progress Notes (Addendum)
[facility] [place], [place] [place], [place]  [address] Phone: [phone] Fax:  [phone]  Date:  05/01/2013   ID:  Hayley Miller, DOB 05/28/1960, MRN [id number]  PCP:  Wenda Low, MD  Cardiologist:  Dr. Dorris Carnes     History of Present Illness: Hayley Miller is a 53 y.o. female CAD, status post anterolateral MI in 2006 treated with PTCA only to the LAD, HTN, HL, GERD, fibromyalgia.  LHC (10/2009): Luminal irregularities in the LAD, no significant obstruction in the RCA or circumflex, apical AK with EF 55-60%.  Pharmacologic Myoview (07/2011):  No ischemia, EF 63%.  Echo (11/2011): Mild LVH, EF 123456, grade 1 diastolic dysfunction.  Last seen by Dr. Harrington Challenger 02/2012.    She returns for surgical clearance.  She needs a TAH/BSO for fibroid uterus. The surgeon is Dr. Freda Munro and the surgery is scheduled 2/10.  Since last seeing Dr. Harrington Challenger, she has noted symptoms of chest pain and arm pain.  Her left arm symptoms are described as a tingling or "shock" sensation. These occur at rest. They're not exertional. However, she does state that she had similar symptoms prior to her MI. She has taken nitroglycerin for chest pain. She really denies any exertional chest pain. She does note dyspnea with exertion but this is chronic and stable. She probably describes NYHA class IIb symptoms. She denies orthopnea, PND or edema. She denies syncope.  Recent Labs: 04/24/2013: Creatinine 1.03; Hemoglobin 10.7*; Potassium 4.4   Wt Readings from Last 3 Encounters:  04/24/13 178 lb (80.74 kg)  09/18/12 179 lb 6.4 oz (81.375 kg)  07/27/12 189 lb 9.6 oz (86.002 kg)     Past Medical History  Diagnosis Date  . HTN (hypertension)   . HLD (hyperlipidemia)   . Chronic pain syndrome   . Hemorrhage     upper gastrointestional. GI bleed 10/09 due to NSAID  . Depression   . GERD (gastroesophageal reflux disease)   . Anxiety   . Fibromyalgia   . Rhinitis   . Myocardial infarction     x2  . Allergy   . Anemia   .  Erosive esophagitis   . Cameron lesion, acute   . Hiatal hernia   . CAD (coronary artery disease)     a. s/p AL MI in 2006 tx with PTCA to LAD;  b. LHC (10/2009): lum irregs in LAD, o/w no CAD, EF 55-60%;  c. Myoview (07/2011):  no ischemia, EF 63%;  d. Echo (11/2011):  mild LVH, EF 60-65%, Gr 1 DD    Current Outpatient Prescriptions  Medication Sig Dispense Refill  . aspirin EC 81 MG tablet Take 81 mg by mouth daily.      [name] atenolol (TENORMIN) 50 MG tablet TAKE 1 TABLET TWICE DAILY.  60 tablet  1  . cetirizine (ZYRTEC) 10 MG tablet Take 10 mg by mouth as needed for allergies.      [name] gabapentin (NEURONTIN) 300 MG capsule Take 300 mg by mouth 3 (three) times daily.      . hydrochlorothiazide (HYDRODIURIL) 25 MG tablet Take 1 tablet (25 mg total) by mouth daily.  30 tablet  5  . hydrOXYzine (ATARAX/VISTARIL) 25 MG tablet Take 25 mg by mouth every 4 (four) hours as needed for itching.      . loratadine (CLARITIN) 10 MG tablet Take 10 mg by mouth daily as needed for allergies.      [name] NEXIUM 40 MG capsule TAKE (1) CAPSULE TWICE DAILY.  60 capsule  0  . nitroGLYCERIN (NITROSTAT) 0.4 MG SL tablet Place 1 tablet (0.4 mg total) under the tongue every 5 (five) minutes as needed for chest pain.  20 tablet  1  . potassium chloride (K-DUR) 10 MEQ tablet Take 10 mEq by mouth 2 (two) times daily.      . pravastatin (PRAVACHOL) 40 MG tablet Take 1 tablet (40 mg total) by mouth at bedtime.  30 tablet  5  . ranitidine (ZANTAC) 150 MG tablet Take 1 tablet (150 mg total) by mouth 2 (two) times daily.  60 tablet  3   No current facility-administered medications for this visit.    Allergies:   Clonidine hydrochloride; Ibuprofen; and Lovastatin   Social History:  The patient  reports that she has been passively smoking.  She has never used smokeless tobacco. She reports that she does not drink alcohol or use illicit drugs.   Family History:  The patient's family history includes Colon cancer in her maternal aunt;  Colon polyps in her mother; Diabetes in her maternal aunt; Heart disease in her maternal grandmother and son; Heart murmur in her mother; Hypertension in her mother. There is no history of Esophageal cancer, Rectal cancer, or Stomach cancer.   ROS:  Please see the history of present illness.      All other systems reviewed and negative.   PHYSICAL EXAM: VS:  There were no vitals taken for this visit. Well nourished, well developed, in no acute distress HEENT: normal Neck: no JVD Cardiac:  normal S1, S2; RRR; no murmur Lungs:  clear to auscultation bilaterally, no wheezing, rhonchi or rales Abd: soft, nontender, no hepatomegaly Ext: no edema Skin: warm and dry Neuro:  CNs 2-12 intact, no focal abnormalities noted  EKG:  NSR, HR 74, LAD, nonspecific ST-T wave changes     ASSESSMENT AND PLAN:  1. Surgical clearance: Patient needs a hysterectomy for fibroid uterus.  She has had recent left arm symptoms that are reminiscent of what she had prior to her myocardial infarction. She's also had some chest pain. Overall her symptoms are quite atypical for ischemia.  She has chronic dyspnea with exertion. It is difficult to assess her functional status. She does have some subtle nonspecific ST changes on her ECG that were not noted previously. Therefore, the patient will be set up for an ETT-Myoview. If this is low risk, she will require no further cardiac workup prior to her non-cardiac surgery. 2. CAD:  Proceed with Myoview as noted. Continue aspirin, beta blocker, statin. 3. Hypertension:  Uncontrolled. Add amlodipine 5 mg daily. 4. Hyperlipidemia:  Continue statin. Followed by primary care. 5. Disposition:   Follow up with Dr. Harrington Challenger in 3 months.  Signed, Richardson Dopp, PA-C  05/01/2013 1:55 PM    Addendum:  Carlton Adam Myoview (05/02/13):  Low risk, no ischemia with normal EF.   She may proceed with her non-cardiac surgery at acceptable risk.  If ASA needs to be held, this should be resumed post op as  soon as safe to resume.  She should remain on her beta blocker throughout the peri-operative period. Signed,  Richardson Dopp, PA-C   05/02/2013 12:50 PM

## 2013-05-01 NOTE — Patient Instructions (Addendum)
Your physician has requested that you have en exercise stress myoview PER SCOTT WEAVER, PAC SURGERY HAS BEEN SCHEDULED FOR 05/08/13; DX SURG CLEARANCE, CHEST PAIN, CAD, HTN. For further information please visit [email or web address]. Please follow instruction sheet, as given.  START AMLODIPINE 5 MG DAILY; RX SENT IN  Your physician recommends that you schedule a follow-up appointment in: [place]. Harrington Challenger

## 2013-05-02 ENCOUNTER — Encounter: Payer: Self-pay | Admitting: Physician Assistant

## 2013-05-02 ENCOUNTER — Ambulatory Visit (HOSPITAL_COMMUNITY)
Admission: RE | Admit: 2013-05-02 | Discharge: 2013-05-02 | Disposition: A | Discharge status: Home / Self Care | Payer: Medicare HMO | Source: Ambulatory Visit | Attending: Physician Assistant | Admitting: Physician Assistant

## 2013-05-02 DIAGNOSIS — Z01810 Encounter for preprocedural cardiovascular examination: Secondary | ICD-10-CM

## 2013-05-02 DIAGNOSIS — R0602 Shortness of breath: Secondary | ICD-10-CM

## 2013-05-02 DIAGNOSIS — R0609 Other forms of dyspnea: Secondary | ICD-10-CM | POA: Insufficient documentation

## 2013-05-02 DIAGNOSIS — Z8249 Family history of ischemic heart disease and other diseases of the circulatory system: Secondary | ICD-10-CM | POA: Insufficient documentation

## 2013-05-02 DIAGNOSIS — I2510 Atherosclerotic heart disease of native coronary artery without angina pectoris: Secondary | ICD-10-CM

## 2013-05-02 DIAGNOSIS — F17200 Nicotine dependence, unspecified, uncomplicated: Secondary | ICD-10-CM | POA: Insufficient documentation

## 2013-05-02 DIAGNOSIS — R9431 Abnormal electrocardiogram [ECG] [EKG]: Secondary | ICD-10-CM | POA: Insufficient documentation

## 2013-05-02 DIAGNOSIS — R0989 Other specified symptoms and signs involving the circulatory and respiratory systems: Secondary | ICD-10-CM | POA: Insufficient documentation

## 2013-05-02 DIAGNOSIS — Z9861 Coronary angioplasty status: Secondary | ICD-10-CM | POA: Insufficient documentation

## 2013-05-02 DIAGNOSIS — R079 Chest pain, unspecified: Secondary | ICD-10-CM

## 2013-05-02 DIAGNOSIS — I252 Old myocardial infarction: Secondary | ICD-10-CM | POA: Insufficient documentation

## 2013-05-02 DIAGNOSIS — R5381 Other malaise: Secondary | ICD-10-CM | POA: Insufficient documentation

## 2013-05-02 DIAGNOSIS — E785 Hyperlipidemia, unspecified: Secondary | ICD-10-CM | POA: Insufficient documentation

## 2013-05-02 DIAGNOSIS — I10 Essential (primary) hypertension: Secondary | ICD-10-CM | POA: Insufficient documentation

## 2013-05-02 DIAGNOSIS — E663 Overweight: Secondary | ICD-10-CM | POA: Insufficient documentation

## 2013-05-02 DIAGNOSIS — R5383 Other fatigue: Secondary | ICD-10-CM

## 2013-05-02 MED ORDER — TECHNETIUM TC 99M SESTAMIBI GENERIC - CARDIOLITE
10.0000 | Freq: Once | INTRAVENOUS | Status: AC | PRN
  Administered 2013-05-02: 10 via INTRAVENOUS

## 2013-05-02 MED ORDER — TECHNETIUM TC 99M SESTAMIBI GENERIC - CARDIOLITE
30.0000 | Freq: Once | INTRAVENOUS | Status: AC | PRN
  Administered 2013-05-02: 30 via INTRAVENOUS

## 2013-05-02 MED ORDER — REGADENOSON 0.4 MG/5ML IV SOLN
0.4000 mg | Freq: Once | INTRAVENOUS | Status: AC
  Administered 2013-05-02: 0.4 mg via INTRAVENOUS

## 2013-05-02 NOTE — Procedures (Addendum)
[facility] [address] [place] [address] [id number]  Cardiology Nuclear Med Study  Hayley Miller is a 53 y.o. female     MRN : [id number]     DOB: [birth year]  Procedure Date: 05/02/2013  Nuclear Med Background Indication for Stress Test:  Surgical Clearance and Abnormal EKG History:  CAD;MI-2006;PTCA-2006 Cardiac Risk Factors: Family History - CAD, Hypertension, Lipids, Overweight and Smoker  Symptoms:  Chest Pain, DOE and Fatigue   Nuclear Pre-Procedure Caffeine/Decaff Intake:  7:00pm NPO After: 5:00am   IV Site: R Forearm  IV 0.9% NS with Angio Cath:  22g  Chest Size (in):  n/a IV Started by: Azucena Cecil, RN  Height: 5\' 4"  (1.626 m)  Cup Size: D  BMI:  Body mass index is 30.02 kg/(m^2). Weight:  175 lb (79.379 kg)   Tech Comments:  Pt took atenolol this am changed to [place] 1 or 2 [place] study: 1 Ast  Stress Test Type:  [place] Provider:  Dorris Carnes, MD   Resting Radionuclide: Technetium 30m Sestamibi  Resting Radionuclide Dose: 10.4 mCi   Stress Radionuclide:  Technetium 51m Sestamibi  Stress Radionuclide Dose: 29.4 mCi           Stress Protocol Rest HR: 68 Stress HR: 88  Rest BP: 140/103 Stress BP: 125/90  Exercise Time (min): n/a METS: n/a   Predicted Max HR: 169 bpm % Max HR: 53.25 bpm Rate Pressure Product: 13320  Dose of Adenosine (mg):  n/a Dose of Lexiscan: 0.4 mg  Dose of Atropine (mg): n/a Dose of Dobutamine: n/a mcg/kg/min (at max HR)  Stress Test Technologist: Leane Para, CCT Nuclear Technologist: Otho Perl, CNMT   Rest Procedure:  Myocardial perfusion imaging was performed at rest 45 minutes following the intravenous administration of Technetium 104m Sestamibi. Stress Procedure:  The patient received IV Lexiscan 0.4 mg over 15-seconds.  Technetium 42m Sestamibi injected at 30-seconds.  There were no significant changes with Lexiscan.   Quantitative spect images were obtained after a 45 minute delay.  Transient Ischemic Dilatation (Normal <1.22):  0.93 Lung/Heart Ratio (Normal <0.45):  0.32 QGS EDV:  80 ml QGS ESV:  33 ml LV Ejection Fraction: 58%  Rest ECG: NSR - Normal EKG  Stress ECG: No significant change from baseline ECG  QPS Raw Data Images:  Significant liver uptake and inferior attenuation artifact Stress Images:  There is decreased uptake in the inferior wall. Rest Images:  There is decreased uptake in the inferior wall. Subtraction (SDS):  Inferoapical perfusion defect, worse at rest than stress  Impression Exercise Capacity:  Lexiscan with no exercise. BP Response:  Normal blood pressure response. Clinical Symptoms:  No significant symptoms noted. ECG Impression:  No significant ECG changes with Lexiscan. Comparison with Prior Nuclear Study: No images to compare  Overall Impression:  Low risk stress nuclear study with significant inferior bowel attenuation artifact. Inferoapical defect was worse at rest than stress. No reversible ischemia.  LV Wall Motion:  NL LV Function; NL Wall Motion; EF 58%.  Pixie Casino, MD, [facility] Board Certified in Nuclear Cardiology Attending Cardiologist [place], MD  05/02/2013 12:19 PM

## 2013-05-07 MED ORDER — CEFAZOLIN SODIUM-DEXTROSE 2-3 GM-% IV SOLR
2.0000 g | INTRAVENOUS | Status: AC
  Administered 2013-05-08: 2 g via INTRAVENOUS

## 2013-05-08 ENCOUNTER — Ambulatory Visit (HOSPITAL_COMMUNITY): Payer: Medicare HMO | Admitting: Anesthesiology

## 2013-05-08 ENCOUNTER — Observation Stay (HOSPITAL_COMMUNITY)
Admission: RE | Admit: 2013-05-08 | Discharge: 2013-05-09 | Disposition: A | Discharge status: Home / Self Care | Payer: Medicare HMO | Source: Ambulatory Visit | Attending: Obstetrics and Gynecology | Admitting: Obstetrics and Gynecology

## 2013-05-08 ENCOUNTER — Encounter (HOSPITAL_COMMUNITY)
Admission: RE | Disposition: A | Discharge status: Home / Self Care | Payer: Self-pay | Source: Ambulatory Visit | Attending: Obstetrics and Gynecology

## 2013-05-08 ENCOUNTER — Encounter (HOSPITAL_COMMUNITY): Payer: Self-pay | Admitting: *Deleted

## 2013-05-08 ENCOUNTER — Encounter (HOSPITAL_COMMUNITY): Payer: Medicare HMO | Admitting: Anesthesiology

## 2013-05-08 DIAGNOSIS — N393 Stress incontinence (female) (male): Secondary | ICD-10-CM | POA: Insufficient documentation

## 2013-05-08 DIAGNOSIS — N8300 Follicular cyst of ovary, unspecified side: Secondary | ICD-10-CM | POA: Insufficient documentation

## 2013-05-08 DIAGNOSIS — K219 Gastro-esophageal reflux disease without esophagitis: Secondary | ICD-10-CM | POA: Insufficient documentation

## 2013-05-08 DIAGNOSIS — D251 Intramural leiomyoma of uterus: Secondary | ICD-10-CM | POA: Insufficient documentation

## 2013-05-08 DIAGNOSIS — I2510 Atherosclerotic heart disease of native coronary artery without angina pectoris: Secondary | ICD-10-CM | POA: Insufficient documentation

## 2013-05-08 DIAGNOSIS — N8000 Endometriosis of the uterus, unspecified: Secondary | ICD-10-CM | POA: Insufficient documentation

## 2013-05-08 DIAGNOSIS — N814 Uterovaginal prolapse, unspecified: Principal | ICD-10-CM | POA: Insufficient documentation

## 2013-05-08 DIAGNOSIS — K449 Diaphragmatic hernia without obstruction or gangrene: Secondary | ICD-10-CM | POA: Insufficient documentation

## 2013-05-08 DIAGNOSIS — N72 Inflammatory disease of cervix uteri: Secondary | ICD-10-CM | POA: Insufficient documentation

## 2013-05-08 DIAGNOSIS — G894 Chronic pain syndrome: Secondary | ICD-10-CM | POA: Insufficient documentation

## 2013-05-08 DIAGNOSIS — N841 Polyp of cervix uteri: Secondary | ICD-10-CM | POA: Insufficient documentation

## 2013-05-08 DIAGNOSIS — N838 Other noninflammatory disorders of ovary, fallopian tube and broad ligament: Secondary | ICD-10-CM | POA: Insufficient documentation

## 2013-05-08 DIAGNOSIS — E785 Hyperlipidemia, unspecified: Secondary | ICD-10-CM | POA: Insufficient documentation

## 2013-05-08 DIAGNOSIS — F17200 Nicotine dependence, unspecified, uncomplicated: Secondary | ICD-10-CM | POA: Insufficient documentation

## 2013-05-08 DIAGNOSIS — I10 Essential (primary) hypertension: Secondary | ICD-10-CM | POA: Insufficient documentation

## 2013-05-08 DIAGNOSIS — D649 Anemia, unspecified: Secondary | ICD-10-CM | POA: Insufficient documentation

## 2013-05-08 DIAGNOSIS — D252 Subserosal leiomyoma of uterus: Secondary | ICD-10-CM | POA: Insufficient documentation

## 2013-05-08 DIAGNOSIS — D219 Benign neoplasm of connective and other soft tissue, unspecified: Secondary | ICD-10-CM | POA: Diagnosis present

## 2013-05-08 DIAGNOSIS — N949 Unspecified condition associated with female genital organs and menstrual cycle: Secondary | ICD-10-CM | POA: Insufficient documentation

## 2013-05-08 HISTORY — PX: LAPAROSCOPIC ASSISTED VAGINAL HYSTERECTOMY: SHX5398

## 2013-05-08 HISTORY — PX: CYSTOSCOPY: SHX5120

## 2013-05-08 HISTORY — PX: BLADDER SUSPENSION: SHX72

## 2013-05-08 HISTORY — PX: LAPAROSCOPIC BILATERAL SALPINGO OOPHERECTOMY: SHX5890

## 2013-05-08 LAB — PREGNANCY, URINE: Preg Test, Ur: NEGATIVE

## 2013-05-08 SURGERY — HYSTERECTOMY, VAGINAL, LAPAROSCOPY-ASSISTED
Anesthesia: General | Site: Vagina

## 2013-05-08 MED ORDER — LACTATED RINGERS IV SOLN
INTRAVENOUS | Status: DC
  Administered 2013-05-08 (×5): via INTRAVENOUS

## 2013-05-08 MED ORDER — GLYCOPYRROLATE 0.2 MG/ML IJ SOLN
INTRAMUSCULAR | Status: AC
  Filled 2013-05-08: qty 3

## 2013-05-08 MED ORDER — HYDROMORPHONE HCL PF 1 MG/ML IJ SOLN
INTRAMUSCULAR | Status: AC
Start: 2013-05-08 — End: 2013-05-08
  Administered 2013-05-08: 0.5 mg via INTRAVENOUS
  Filled 2013-05-08: qty 1

## 2013-05-08 MED ORDER — BUPIVACAINE HCL (PF) 0.25 % IJ SOLN
INTRAMUSCULAR | Status: DC | PRN
  Administered 2013-05-08: 6 mL

## 2013-05-08 MED ORDER — LACTATED RINGERS IR SOLN
Status: DC | PRN
  Administered 2013-05-08: 3000 mL

## 2013-05-08 MED ORDER — NEOSTIGMINE METHYLSULFATE 1 MG/ML IJ SOLN
INTRAMUSCULAR | Status: AC
  Filled 2013-05-08: qty 1

## 2013-05-08 MED ORDER — DEXAMETHASONE SODIUM PHOSPHATE 10 MG/ML IJ SOLN
INTRAMUSCULAR | Status: DC | PRN
  Administered 2013-05-08: 10 mg via INTRAVENOUS

## 2013-05-08 MED ORDER — PHENYLEPHRINE HCL 10 MG/ML IJ SOLN
INTRAMUSCULAR | Status: DC | PRN
  Administered 2013-05-08 (×9): 40 ug via INTRAVENOUS
  Administered 2013-05-08: 80 ug via INTRAVENOUS
  Administered 2013-05-08 (×7): 40 ug via INTRAVENOUS

## 2013-05-08 MED ORDER — HYDROMORPHONE HCL PF 1 MG/ML IJ SOLN
0.2000 mg | INTRAMUSCULAR | Status: DC | PRN
  Administered 2013-05-08: 0.6 mg via INTRAVENOUS
  Filled 2013-05-08: qty 1

## 2013-05-08 MED ORDER — KETOROLAC TROMETHAMINE 30 MG/ML IJ SOLN
INTRAMUSCULAR | Status: AC
  Filled 2013-05-08: qty 1

## 2013-05-08 MED ORDER — POTASSIUM CHLORIDE ER 10 MEQ PO TBCR
10.0000 meq | EXTENDED_RELEASE_TABLET | Freq: Two times a day (BID) | ORAL | Status: DC
  Administered 2013-05-08 – 2013-05-09 (×2): 10 meq via ORAL
  Filled 2013-05-08 (×5): qty 1

## 2013-05-08 MED ORDER — LIDOCAINE-EPINEPHRINE 1 %-1:100000 IJ SOLN
INTRAMUSCULAR | Status: DC | PRN
  Administered 2013-05-08: 60 mL

## 2013-05-08 MED ORDER — DOCUSATE SODIUM 100 MG PO CAPS
100.0000 mg | ORAL_CAPSULE | Freq: Two times a day (BID) | ORAL | Status: DC
  Administered 2013-05-08 – 2013-05-09 (×2): 100 mg via ORAL
  Filled 2013-05-08 (×2): qty 1

## 2013-05-08 MED ORDER — FENTANYL CITRATE 0.05 MG/ML IJ SOLN
INTRAMUSCULAR | Status: AC
  Filled 2013-05-08: qty 5

## 2013-05-08 MED ORDER — PHENYLEPHRINE 40 MCG/ML (10ML) SYRINGE FOR IV PUSH (FOR BLOOD PRESSURE SUPPORT)
PREFILLED_SYRINGE | INTRAVENOUS | Status: AC
  Filled 2013-05-08: qty 10

## 2013-05-08 MED ORDER — ONDANSETRON HCL 4 MG/2ML IJ SOLN
INTRAMUSCULAR | Status: DC | PRN
  Administered 2013-05-08: 4 mg via INTRAVENOUS

## 2013-05-08 MED ORDER — HYDROCHLOROTHIAZIDE 25 MG PO TABS
25.0000 mg | ORAL_TABLET | Freq: Every day | ORAL | Status: DC
Start: 2013-05-09 — End: 2013-05-09
  Administered 2013-05-09: 25 mg via ORAL
  Filled 2013-05-08 (×2): qty 1

## 2013-05-08 MED ORDER — ROCURONIUM BROMIDE 100 MG/10ML IV SOLN
INTRAVENOUS | Status: AC
  Filled 2013-05-08: qty 1

## 2013-05-08 MED ORDER — ONDANSETRON HCL 4 MG/2ML IJ SOLN: 4.0000 mg | Freq: Four times a day (QID) | INTRAMUSCULAR | Status: DC | PRN

## 2013-05-08 MED ORDER — LIDOCAINE-EPINEPHRINE 1 %-1:100000 IJ SOLN
INTRAMUSCULAR | Status: AC
  Filled 2013-05-08: qty 4

## 2013-05-08 MED ORDER — DEXTROSE IN LACTATED RINGERS 5 % IV SOLN
INTRAVENOUS | Status: DC
  Administered 2013-05-08 – 2013-05-09 (×2): via INTRAVENOUS

## 2013-05-08 MED ORDER — EPHEDRINE SULFATE 50 MG/ML IJ SOLN
INTRAMUSCULAR | Status: DC | PRN
  Administered 2013-05-08: 10 mg via INTRAVENOUS

## 2013-05-08 MED ORDER — HYDROMORPHONE HCL PF 1 MG/ML IJ SOLN
0.2500 mg | INTRAMUSCULAR | Status: DC | PRN
  Administered 2013-05-08 (×2): 0.5 mg via INTRAVENOUS
  Administered 2013-05-08: 0.25 mg via INTRAVENOUS

## 2013-05-08 MED ORDER — ROCURONIUM BROMIDE 100 MG/10ML IV SOLN
INTRAVENOUS | Status: DC | PRN
  Administered 2013-05-08: 10 mg via INTRAVENOUS
  Administered 2013-05-08: 5 mg via INTRAVENOUS
  Administered 2013-05-08: 50 mg via INTRAVENOUS

## 2013-05-08 MED ORDER — NITROGLYCERIN 0.4 MG SL SUBL
0.4000 mg | SUBLINGUAL_TABLET | SUBLINGUAL | Status: DC | PRN
  Filled 2013-05-08: qty 25

## 2013-05-08 MED ORDER — GLYCOPYRROLATE 0.2 MG/ML IJ SOLN
INTRAMUSCULAR | Status: DC | PRN
  Administered 2013-05-08: 0.6 mg via INTRAVENOUS

## 2013-05-08 MED ORDER — MIDAZOLAM HCL 2 MG/2ML IJ SOLN
INTRAMUSCULAR | Status: DC | PRN
  Administered 2013-05-08: 2 mg via INTRAVENOUS

## 2013-05-08 MED ORDER — LIDOCAINE HCL (CARDIAC) 20 MG/ML IV SOLN
INTRAVENOUS | Status: DC | PRN
  Administered 2013-05-08: 80 mg via INTRAVENOUS

## 2013-05-08 MED ORDER — SIMETHICONE 80 MG PO CHEW: 80.0000 mg | CHEWABLE_TABLET | Freq: Four times a day (QID) | ORAL | Status: DC | PRN

## 2013-05-08 MED ORDER — DEXAMETHASONE SODIUM PHOSPHATE 10 MG/ML IJ SOLN
INTRAMUSCULAR | Status: AC
  Filled 2013-05-08: qty 1

## 2013-05-08 MED ORDER — HYDROMORPHONE HCL PF 1 MG/ML IJ SOLN
INTRAMUSCULAR | Status: AC
  Administered 2013-05-08: 0.25 mg via INTRAVENOUS
  Filled 2013-05-08: qty 1

## 2013-05-08 MED ORDER — OXYCODONE-ACETAMINOPHEN 5-325 MG PO TABS
1.0000 | ORAL_TABLET | ORAL | Status: DC | PRN
  Administered 2013-05-08 – 2013-05-09 (×4): 2 via ORAL
  Filled 2013-05-08 (×4): qty 2

## 2013-05-08 MED ORDER — ONDANSETRON HCL 4 MG PO TABS: 4.0000 mg | ORAL_TABLET | Freq: Four times a day (QID) | ORAL | Status: DC | PRN

## 2013-05-08 MED ORDER — ONDANSETRON HCL 4 MG/2ML IJ SOLN
INTRAMUSCULAR | Status: AC
  Filled 2013-05-08: qty 2

## 2013-05-08 MED ORDER — MIDAZOLAM HCL 2 MG/2ML IJ SOLN
INTRAMUSCULAR | Status: AC
  Filled 2013-05-08: qty 2

## 2013-05-08 MED ORDER — SODIUM CHLORIDE 0.9 % IJ SOLN
INTRAMUSCULAR | Status: AC
  Filled 2013-05-08: qty 50

## 2013-05-08 MED ORDER — PROPOFOL 10 MG/ML IV EMUL
INTRAVENOUS | Status: AC
  Filled 2013-05-08: qty 20

## 2013-05-08 MED ORDER — SODIUM CHLORIDE 0.9 % IJ SOLN
INTRAMUSCULAR | Status: DC | PRN
  Administered 2013-05-08: 50 mL via INTRAVENOUS

## 2013-05-08 MED ORDER — LIDOCAINE-EPINEPHRINE 1 %-1:100000 IJ SOLN
INTRAMUSCULAR | Status: DC | PRN
  Administered 2013-05-08: 10 mL

## 2013-05-08 MED ORDER — LIDOCAINE HCL (CARDIAC) 20 MG/ML IV SOLN
INTRAVENOUS | Status: AC
Start: 2013-05-08 — End: 2013-05-08
  Filled 2013-05-08: qty 5

## 2013-05-08 MED ORDER — AMLODIPINE BESYLATE 5 MG PO TABS
5.0000 mg | ORAL_TABLET | Freq: Every day | ORAL | Status: DC
  Administered 2013-05-09: 5 mg via ORAL
  Filled 2013-05-08 (×2): qty 1

## 2013-05-08 MED ORDER — FENTANYL CITRATE 0.05 MG/ML IJ SOLN
INTRAMUSCULAR | Status: DC | PRN
  Administered 2013-05-08 (×4): 50 ug via INTRAVENOUS

## 2013-05-08 MED ORDER — EPHEDRINE 5 MG/ML INJ
INTRAVENOUS | Status: AC
  Filled 2013-05-08: qty 10

## 2013-05-08 MED ORDER — PHENYLEPHRINE 40 MCG/ML (10ML) SYRINGE FOR IV PUSH (FOR BLOOD PRESSURE SUPPORT)
PREFILLED_SYRINGE | INTRAVENOUS | Status: AC
  Filled 2013-05-08: qty 5

## 2013-05-08 MED ORDER — BUPIVACAINE HCL (PF) 0.25 % IJ SOLN
INTRAMUSCULAR | Status: AC
  Filled 2013-05-08: qty 30

## 2013-05-08 MED ORDER — PROPOFOL 10 MG/ML IV BOLUS
INTRAVENOUS | Status: DC | PRN
  Administered 2013-05-08: 50 mg via INTRAVENOUS
  Administered 2013-05-08: 150 mg via INTRAVENOUS

## 2013-05-08 MED ORDER — NEOSTIGMINE METHYLSULFATE 1 MG/ML IJ SOLN
INTRAMUSCULAR | Status: DC | PRN
  Administered 2013-05-08: 2.5 mg via INTRAVENOUS

## 2013-05-08 MED ORDER — LIDOCAINE-EPINEPHRINE 1 %-1:100000 IJ SOLN
INTRAMUSCULAR | Status: AC
  Filled 2013-05-08: qty 1

## 2013-05-08 MED ORDER — MORPHINE SULFATE 10 MG/ML IJ SOLN
10.0000 mg | Freq: Once | INTRAMUSCULAR | Status: AC
  Administered 2013-05-08: 10 mg via INTRAMUSCULAR
  Filled 2013-05-08: qty 1

## 2013-05-08 SURGICAL SUPPLY — 38 items
ADH SKN CLS APL DERMABOND .7 (GAUZE/BANDAGES/DRESSINGS) ×4
CATH ROBINSON RED A/P 16FR (CATHETERS) ×4 IMPLANT
CLOSURE WOUND 1/4 X3 (GAUZE/BANDAGES/DRESSINGS)
CLOTH BEACON ORANGE TIMEOUT ST (SAFETY) ×4 IMPLANT
COVER TABLE BACK 60X90 (DRAPES) ×4 IMPLANT
DECANTER SPIKE VIAL GLASS SM (MISCELLANEOUS) ×4 IMPLANT
DERMABOND ADVANCED (GAUZE/BANDAGES/DRESSINGS) ×4
DERMABOND ADVANCED .7 DNX12 (GAUZE/BANDAGES/DRESSINGS) ×4 IMPLANT
ELECT REM PT RETURN 9FT ADLT (ELECTROSURGICAL)
ELECTRODE REM PT RTRN 9FT ADLT (ELECTROSURGICAL) IMPLANT
EVACUATOR SMOKE 8.L (FILTER) ×4 IMPLANT
FORCEPS CUTTING 33CM 5MM (CUTTING FORCEPS) ×4 IMPLANT
GLOVE ECLIPSE 7.0 STRL STRAW (GLOVE) ×8 IMPLANT
GOWN STRL REUS W/TWL LRG LVL3 (GOWN DISPOSABLE) ×16 IMPLANT
NS IRRIG 1000ML POUR BTL (IV SOLUTION) ×4 IMPLANT
PACK LAVH (CUSTOM PROCEDURE TRAY) ×4 IMPLANT
PROTECTOR NERVE ULNAR (MISCELLANEOUS) ×4 IMPLANT
SCISSORS LAP 5X35 DISP (ENDOMECHANICALS) IMPLANT
SET IRRIG TUBING LAPAROSCOPIC (IRRIGATION / IRRIGATOR) IMPLANT
SLING TVT EXACT (Sling) ×4 IMPLANT
SOLUTION ELECTROLUBE (MISCELLANEOUS) ×4 IMPLANT
STRIP CLOSURE SKIN 1/4X3 (GAUZE/BANDAGES/DRESSINGS) IMPLANT
SUT MNCRL 0 MO-4 VIOLET 18 CR (SUTURE) ×4 IMPLANT
SUT MNCRL 0 VIOLET 6X18 (SUTURE) ×2 IMPLANT
SUT MONOCRYL 0 6X18 (SUTURE) ×2
SUT MONOCRYL 0 MO 4 18  CR/8 (SUTURE) ×4
SUT VIC AB 0 CT1 18XCR BRD8 (SUTURE) ×4 IMPLANT
SUT VIC AB 0 CT1 8-18 (SUTURE) ×8
SUT VIC AB 2-0 CT1 27 (SUTURE) ×16
SUT VIC AB 2-0 CT1 TAPERPNT 27 (SUTURE) ×8 IMPLANT
SUT VICRYL 0 UR6 27IN ABS (SUTURE) ×8 IMPLANT
SUT VICRYL RAPIDE 3 0 (SUTURE) ×4 IMPLANT
TOWEL OR 17X24 6PK STRL BLUE (TOWEL DISPOSABLE) ×12 IMPLANT
TRAY FOLEY BAG SILVER LF 14FR (CATHETERS) ×4 IMPLANT
TROCAR BALLN 12MMX100 BLUNT (TROCAR) ×4 IMPLANT
TROCAR XCEL NON-BLD 5MMX100MML (ENDOMECHANICALS) ×8 IMPLANT
WARMER LAPAROSCOPE (MISCELLANEOUS) ×4 IMPLANT
WATER STERILE IRR 1000ML POUR (IV SOLUTION) IMPLANT

## 2013-05-08 NOTE — Anesthesia Preprocedure Evaluation (Addendum)
Anesthesia Evaluation  Patient identified by MRN, date of birth, ID band Patient awake    Reviewed: Allergy & Precautions, H&P , Patient's Chart, lab work & pertinent test results, reviewed documented beta blocker date and time   Airway Mallampati: II TM Distance: >3 FB Neck ROM: full    Dental no notable dental hx.    Pulmonary  breath sounds clear to auscultation  Pulmonary exam normal       Cardiovascular hypertension, On Medications + Past MI (No CAD sx, Good EF) Rhythm:regular Rate:Normal     Neuro/Psych PSYCHIATRIC DISORDERS  Neuromuscular disease    GI/Hepatic hiatal hernia, GERD-  ,  Endo/Other    Renal/GU      Musculoskeletal   Abdominal   Peds  Hematology  (+) anemia ,   Anesthesia Other Findings   Reproductive/Obstetrics                          Anesthesia Physical Anesthesia Plan  ASA: III  Anesthesia Plan: General   Post-op Pain Management:    Induction: Intravenous  Airway Management Planned: Oral ETT  Additional Equipment:   Intra-op Plan:   Post-operative Plan: Extubation in OR  Informed Consent: I have reviewed the patients History and Physical, chart, labs and discussed the procedure including the risks, benefits and alternatives for the proposed anesthesia with the patient or authorized representative who has indicated his/her understanding and acceptance.   Dental Advisory Given and Dental advisory given  Plan Discussed with: CRNA and Surgeon  Anesthesia Plan Comments: (  Discussed general anesthesia, including possible nausea, instrumentation of airway, sore throat,pulmonary aspiration, etc. I asked if the were any outstanding questions, or  concerns before we proceeded. )        Anesthesia Quick Evaluation

## 2013-05-08 NOTE — Anesthesia Postprocedure Evaluation (Signed)
  Anesthesia Post Note  Patient: Hayley Miller  Procedure(s) Performed: Procedure(s) (LRB): LAPAROSCOPIC ASSISTED VAGINAL HYSTERECTOMY (N/A) LAPAROSCOPIC BILATERAL SALPINGO OOPHORECTOMY (N/A) CYSTOSCOPY (N/A) TRANSVAGINAL TAPE (TVT) PROCEDURE (N/A)  Anesthesia type: GA  Patient location: PACU  Post pain: Pain level controlled  Post assessment: Post-op Vital signs reviewed  Last Vitals:  Filed Vitals:   05/08/13 1000  BP: 95/49  Pulse: 78  Temp: 37.3 C  Resp: 16    Post vital signs: Reviewed  Level of consciousness: sedated  Complications: No apparent anesthesia complications

## 2013-05-08 NOTE — Transfer of Care (Signed)
Immediate Anesthesia Transfer of Care Note  Patient: Hayley Miller  Procedure(s) Performed: Procedure(s): LAPAROSCOPIC ASSISTED VAGINAL HYSTERECTOMY (N/A) LAPAROSCOPIC BILATERAL SALPINGO OOPHORECTOMY (N/A) CYSTOSCOPY (N/A) TRANSVAGINAL TAPE (TVT) PROCEDURE (N/A)  Patient Location: PACU  Anesthesia Type:General  Level of Consciousness: awake, alert , oriented and patient cooperative  Airway & Oxygen Therapy: Patient Spontanous Breathing and Patient connected to nasal cannula oxygen  Post-op Assessment: Report given to PACU RN and Post -op Vital signs reviewed and stable  Post vital signs: Reviewed and stable  Complications: No apparent anesthesia complications

## 2013-05-08 NOTE — H&P (Signed)
Pt is a 53 y/o old black female who presents to the OR for a LAVHBSO possible TAHBSO along with a TVT/Cysto for symptomatic uterine fibroids and SUI. Pt has a several yr ho of increasing pelvic pain and prolapse. She has classic symptoms of SUI and no urge incontience. She has a h.o. a MI and has seen cardiology for clearance. Chief Complaint: HPI:  Past Medical History  Diagnosis Date  . HTN (hypertension)   . HLD (hyperlipidemia)   . Chronic pain syndrome   . Hemorrhage     upper gastrointestional. GI bleed 10/09 due to NSAID  . Depression   . GERD (gastroesophageal reflux disease)   . Anxiety   . Fibromyalgia   . Rhinitis   . Myocardial infarction     x2  . Allergy   . Anemia   . Erosive esophagitis   . Cameron lesion, acute   . Hiatal hernia   . CAD (coronary artery disease)     a. s/p AL MI in 2006 tx with PTCA to LAD;  b. LHC (10/2009): lum irregs in LAD, o/w no CAD, EF 55-60%;  c. Myoview (07/2011):  no ischemia, EF 63%;  d. Echo (11/2011):  mild LVH, EF 60-65%, Gr 1 DD;  e.  Lexiscan Myoview (04/2013):  Inferior bowel atten artifact with inferoapical defect; no ischemia; EF 58% (low risk)    Past Surgical History  Procedure Laterality Date  . L breast - cyst removed  2002  . Cardiac catheterization    . Upper gastrointestinal endoscopy      Family History  Problem Relation Age of Onset  . Hypertension Mother   . Heart murmur Mother   . Colon polyps Mother   . Colon cancer Maternal Aunt   . Heart disease Son   . Heart disease Maternal Grandmother   . Diabetes Maternal Aunt   . Esophageal cancer Neg Hx   . Rectal cancer Neg Hx   . Stomach cancer Neg Hx    Social History:  reports that she has been passively smoking.  She has never used smokeless tobacco. She reports that she does not drink alcohol or use illicit drugs.  Allergies:  Allergies  Allergen Reactions  . Clonidine Hydrochloride Other (See Comments)    REACTION: FATIGUE  . Ibuprofen Other (See Comments)     REACTION: GI bleed  . Lovastatin Other (See Comments)    REACTION: pains    Medications Prior to Admission  Medication Sig Dispense Refill  . amLODipine (NORVASC) 5 MG tablet Take 1 tablet (5 mg total) by mouth daily.  30 tablet  11  . aspirin EC 81 MG tablet Take 81 mg by mouth daily.      [name] atenolol (TENORMIN) 50 MG tablet TAKE 1 TABLET TWICE DAILY.  60 tablet  1  . cetirizine (ZYRTEC) 10 MG tablet Take 10 mg by mouth as needed for allergies.      [name] gabapentin (NEURONTIN) 300 MG capsule Take 300 mg by mouth 3 (three) times daily.      . hydrochlorothiazide (HYDRODIURIL) 25 MG tablet Take 1 tablet (25 mg total) by mouth daily.  30 tablet  5  . loratadine (CLARITIN) 10 MG tablet Take 10 mg by mouth daily as needed for allergies.      [name] NEXIUM 40 MG capsule TAKE (1) CAPSULE TWICE DAILY.  60 capsule  0  . nitroGLYCERIN (NITROSTAT) 0.4 MG SL tablet Place 1 tablet (0.4 mg total) under the tongue every 5 (five)  minutes as needed for chest pain.  20 tablet  1  . potassium chloride (K-DUR) 10 MEQ tablet Take 10 mEq by mouth 2 (two) times daily.      . pravastatin (PRAVACHOL) 40 MG tablet Take 1 tablet (40 mg total) by mouth at bedtime.  30 tablet  5  . hydrOXYzine (ATARAX/VISTARIL) 25 MG tablet Take 25 mg by mouth every 4 (four) hours as needed for itching.           Blood pressure 141/91, pulse 73, temperature 99.3 F (37.4 C), temperature source Oral, resp. rate 18, SpO2 97.00%. BP 141/91  Pulse 73  Temp(Src) 99.3 F (37.4 C) (Oral)  Resp 18  SpO2 97% General appearance: alert and cooperative Lungs: clear to auscultation bilaterally Abdomen: soft, non-tender; bowel sounds normal; no masses,  no organomegaly   Lab Results  Component Value Date   WBC 7.8 04/24/2013   HGB 10.7* 04/24/2013   HCT 35.0* 04/24/2013   MCV 74.8* 04/24/2013   PLT 555* 04/24/2013   Lab Results  Component Value Date   PREGTESTUR NEGATIVE 05/08/2013     Assessment/Plan Proceed with Ablation  Patient  Active Problem List   Diagnosis Date Noted  . Midsternal chest pain 08/25/2011  . Chest pain 08/24/2011  . WEIGHT GAIN 11/13/2009  . FIBROMYALGIA 04/18/2009  . MYOCARDIAL INFARCTION 04/15/2009  . Chronic pain syndrome 04/03/2009  . HEADACHE 10/07/2008  . LOW BACK PAIN 01/12/2008  . DEPRESSION 07/27/2007  . HYPERLIPIDEMIA 06/27/2007  . ANXIETY 06/27/2007  . HYPERTENSION 06/27/2007  . CORONARY ARTERY DISEASE 06/27/2007  . GERD 06/27/2007   Symptomatic fibroids/prolapse/SUI Plan/ Will proceed with LAVHBSO/possible TAHBSO with TVT/cysto  Sheril Hammond E 05/08/2013, 7:20 AM

## 2013-05-08 NOTE — Anesthesia Postprocedure Evaluation (Signed)
  Anesthesia Post-op Note  Patient: Hayley Miller  Procedure(s) Performed: Procedure(s): LAPAROSCOPIC ASSISTED VAGINAL HYSTERECTOMY (N/A) LAPAROSCOPIC BILATERAL SALPINGO OOPHORECTOMY (N/A) CYSTOSCOPY (N/A) TRANSVAGINAL TAPE (TVT) PROCEDURE (N/A)  Patient Location: PACU and Women's Unit  Anesthesia Type:General  Level of Consciousness: awake, alert , oriented and patient cooperative  Airway and Oxygen Therapy: Patient Spontanous Breathing  Post-op Pain: moderate  Post-op Assessment: Post-op Vital signs reviewed  Post-op Vital Signs: Reviewed and stable  Complications: No apparent anesthesia complications  Anesthesia Post Note  Patient: [name]  Procedure(s) Performed: Procedure(s) (LRB): LAPAROSCOPIC ASSISTED VAGINAL HYSTERECTOMY (N/A) LAPAROSCOPIC BILATERAL SALPINGO OOPHORECTOMY (N/A) CYSTOSCOPY (N/A) TRANSVAGINAL TAPE (TVT) PROCEDURE (N/A)  Anesthesia type: General  Patient location: Women's Unit  Post pain: Pain level controlled  Post assessment: Post-op Vital signs reviewed  Last Vitals:  Filed Vitals:   05/08/13 1400  BP: 109/57  Pulse: 67  Temp: 36.4 C  Resp: 16    Post vital signs: Reviewed  Level of consciousness: sedated  Complications: No apparent anesthesia complications

## 2013-05-09 ENCOUNTER — Encounter (HOSPITAL_COMMUNITY): Payer: Self-pay | Admitting: Obstetrics and Gynecology

## 2013-05-09 LAB — COMPREHENSIVE METABOLIC PANEL
ALT: 9 U/L (ref 0–35)
AST: 13 U/L (ref 0–37)
Albumin: 3.2 g/dL — ABNORMAL LOW (ref 3.5–5.2)
Alkaline Phosphatase: 44 U/L (ref 39–117)
BUN: 9 mg/dL (ref 6–23)
CALCIUM: 9.2 mg/dL (ref 8.4–10.5)
CO2: 25 meq/L (ref 19–32)
Chloride: 104 mEq/L (ref 96–112)
Creatinine, Ser: 0.84 mg/dL (ref 0.50–1.10)
GFR calc Af Amer: >90 mL/min (ref >90)
GFR, EST NON AFRICAN AMERICAN: 79 mL/min — AB (ref >90)
GLUCOSE: 114 mg/dL — AB (ref 70–99)
Potassium: 3.8 mEq/L (ref 3.7–5.3)
SODIUM: 138 meq/L (ref 137–147)
Total Bilirubin: 0.2 mg/dL — ABNORMAL LOW (ref 0.3–1.2)
Total Protein: 6.4 g/dL (ref 6.0–8.3)

## 2013-05-09 LAB — CBC
HEMATOCRIT: 27.9 % — AB (ref 36.0–46.0)
HEMOGLOBIN: 8.7 g/dL — AB (ref 12.0–15.0)
MCH: 23 pg — AB (ref 26.0–34.0)
MCHC: 31.2 g/dL (ref 30.0–36.0)
MCV: 73.6 fL — AB (ref 78.0–100.0)
Platelets: 457 10*3/uL — ABNORMAL HIGH (ref 150–400)
RBC: 3.79 MIL/uL — AB (ref 3.87–5.11)
RDW: 17.8 % — ABNORMAL HIGH (ref 11.5–15.5)
WBC: 13.1 10*3/uL — ABNORMAL HIGH (ref 4.0–10.5)

## 2013-05-09 NOTE — Progress Notes (Signed)
Patient's vaginal packing removed. Scant drainage, no clots present. Patient tolerated well.

## 2013-05-09 NOTE — Op Note (Signed)
NAMEFLORIDA, MIZRAHI                  ACCOUNT NO.:  [id number]  MEDICAL RECORD NO.:  [id number]  LOCATION:  9302                          FACILITY:  [facility]  PHYSICIAN:  Freda Munro, M.D.    DATE OF BIRTH:  1961-02-18  DATE OF PROCEDURE:  05/08/2013 DATE OF DISCHARGE:                              OPERATIVE REPORT   PREOPERATIVE DIAGNOSES: 1. Symptomatic uterine fibroids. 2. Pelvic pain. 3. Uterine prolapse. 4. Urinary stress incontinence.  POSTOPERATIVE DIAGNOSES: 1. Symptomatic uterine fibroids. 2. Pelvic pain. 3. Uterine prolapse. 4. Urinary stress incontinence.  PROCEDURE: 1. Laparoscopic-assisted vaginal hysterectomy with bilateral salpingo-     oophorectomy. 2. Transvaginal urethropexy with TVT Exact. 3. Cystoscopy.  SURGEON:  Freda Munro, M.D.  ASSISTANT:  Barbaraann Rondo, M.D.  ANESTHESIA:  General and local.  ANTIBIOTICS:  Ancef 2 g.  DRAINS:  Foley bedside drainage.  ESTIMATED BLOOD LOSS:  150 mL.  COMPLICATIONS:  None.  PROCEDURE:  The patient was taken to the operating room, where general anesthetic was administered without difficulty.  She was then placed in a dorsal lithotomy position.  She was prepped and draped in usual fashion for this procedure.  A Hulka tenaculum was applied to the anterior cervical lip.  Her umbilicus was then injected with 0.25% Marcaine.  A vertical skin incision was made.  The fascia was identified, grasped with Kocher's and entered with Mayo scissors. Parietal peritoneum was entered bluntly.  0-Vicryl suture was placed in a pursestring fashion.  The Hasson cannula was placed in the abdominal cavity and 3 L of carbon dioxide was insufflated.  The patient was then placed in Trendelenburg.  The patient appeared to have a normal- appearing liver, gallbladder was not visualized.  The appendix was not visualized.  The ovaries were normal bilaterally.  There were multiple uterine leiomyomata.  Fallopian tubes were normal  bilaterally.  5 mm ports were placed in the right and left lower quadrants of the abdomen under direct visualization.  At this point, the right adnexa was examined, the ureter identified.  The infundibulopelvic ligament was cauterized and transected.  The round ligament was cauterized and transected and the remaining broad ligament was cauterized and transected down to the level of the uterine artery.  A similar procedure was performed on the opposite side.  At this point, attention was focused on the vagina.  Weighted speculum was placed in the vagina.  The posterior cul-de-sac was entered sharply.  Uterosacral ligaments were bilaterally clamped, cut, and ligated with 0-Monocryl suture.  The cervix was circumscribed and the bladder pillars were bilaterally clamped, cut, and ligated with 0-Monocryl suture.  Anterior cul-de-sac was entered sharply.  The cardinal ligaments were sterilely clamped, cut, and ligated with 0-Monocryl suture.  Uterine vessels were bilaterally clamped, cut, and ligated with 0-Monocryl suture.  The uterus was then removed and sent to Pathology.  All pedicles were checked and felt to be hemostatic.  The posterior cuff was then run using 2-0 Vicryl in a running, locking fashion.  Uterosacral ligaments were reapproximated in the midline with 0-Monocryl suture.  Remaining vaginal cuff was then closed using 2-0 Vicryl in a running, locking fashion.  At this point,  the abdomen was re-insufflated.  All pedicles were examined, irrigated, and felt to be hemostatic.  At this point, the scope, pneumoperitoneum, and ports were all removed.  Fascia was closed with 0-Monocryl suture, the skin with Dermabond.  Following this, the urethropexy was begun.  The retropubic space was injected with a dilute solution of Neo-Synephrine.  The anterior vaginal wall was also injected with a dilute solution of lidocaine.  A vertical skin incision was made approximately 1 cm distal from the  urethral meatus, and the peri- urethral space opened with the Metzenbaum scissors.  A 16-gauge catheter with a stylet placed through it was placed into the bladder after it had been drained and the bladder deviated to the patient's left.  The TVT Exact trocar was placed through the anterior vaginal wall past the pelvic bone into the retropubic space and out to the skin.  A similar procedure was performed on the opposite side.  At this point, cystoscopy was performed.  There was no evidence of any foreign materials in the bladder.  Both ureter openings were visualized and appeared to be functioning normally.  The urethra also had no foreign material in it. At this point, the cystoscope was removed, the bladder was drained, and the tape was placed into proper position after a Claiborne Billings was placed between the tape and the urethra.  Excess material was trimmed off at the suprapubic region, and it was closed with Dermabond.  The vaginal opening was closed using 2-0 Vicryl in a running, locking fashion.  The vagina was then packed with 2-inch iodoform.  The patient was awakened, taken to the recovery room in stable condition.  Instrument and lap counts were correct x2.          ______________________________ Freda Munro, M.D.     MA/MEDQ  D:  05/08/2013  T:  05/09/2013  Job:  [id number]

## 2013-05-09 NOTE — Progress Notes (Signed)
Discharge instructions reviewed with patient.  Patient states understanding of home care, medications, activity, signs/symptoms to report to MD and return MD office visit.  No home equipment needed.  Patient ambulated for discharge in stable condition with staff without incident.  

## 2013-05-09 NOTE — Progress Notes (Signed)
POD#1    Pt had vaginal packing and Foley removed this am. Has not voided yet. Tolerating pain with p.o.pain meds.  HGB-8.7 VSSAF ABD- soft, non distended. IMP/ Stable Plan/ Voiding trial.

## 2013-05-09 NOTE — Progress Notes (Signed)
Pt doing well.  Had a normal voiding trial. Has tolerated diet. No vaginal bleeding. Would like to go home.  Plan/ Will discharge to home with percocet.

## 2013-05-09 NOTE — Progress Notes (Signed)
Ur chart review completed.  

## 2013-05-16 NOTE — Discharge Summary (Signed)
[name], Hayley Miller                  ACCOUNT NO.:  [id number]  MEDICAL RECORD NO.:  [id number]  LOCATION:  9302                          FACILITY:  [facility]  PHYSICIAN:  Freda Munro, M.D.    DATE OF BIRTH:  September 14, 1960  DATE OF ADMISSION:  05/08/2013 DATE OF DISCHARGE:  05/09/2013                              DISCHARGE SUMMARY   PRINCIPAL DISCHARGE DIAGNOSES: 1. Symptomatic uterine fibroids. 2. Stress urinary incontinence.  PRINCIPLE PROCEDURES: 1. Laparoscopic-assisted vaginal hysterectomy with bilateral salpingo-     oophorectomy. 2. TVT urethropexy. 3. Cystoscopy.  HISTORY OF PRESENT ILLNESS:  Hayley Miller is a 53 year old, black female, who presented to [facility] on May 08, 2013, for a laparoscopic assisted vaginal hysterectomy with bilateral salpingo-oophorectomy, and TVT urethropexy secondary to a several year history of worsening pelvic pain, pelvic pressure, and heavy menses.  The patient also had classic stress urinary incontinence.  Of significance is that she had a history of an MI and did receive Cardiology clearance prior to the procedure.  HOSPITAL COURSE:  The patient underwent this procedure without difficulty.  A complete description can be found in dictated operative note.  The patient's postoperative course was benign.  The patient had a postop hemoglobin of 8.7.  She underwent a voiding trial, which she passed successfully.  She had very little postvoid residual.  The patient had a strong desire to go home, therefore she was discharged to home.  She was instructed to follow up in the office in 2 weeks.  She was told to call the office with any heavy bleeding, significant pain and difficulty voiding.  The patient's pathology was benign.          ______________________________ Freda Munro, M.D.     MA/MEDQ  D:  05/16/2013  T:  05/16/2013  Job:  [id number]

## 2013-05-23 ENCOUNTER — Encounter (HOSPITAL_COMMUNITY): Payer: Self-pay | Admitting: Obstetrics and Gynecology

## 2013-05-23 NOTE — Addendum Note (Signed)
Addendum created 05/23/13 2047 by Rudean Curt, MD   Modules edited: Anesthesia Events

## 2013-07-26 ENCOUNTER — Other Ambulatory Visit: Payer: Self-pay | Admitting: Internal Medicine

## 2013-09-24 ENCOUNTER — Encounter: Payer: Self-pay | Admitting: *Deleted

## 2013-12-03 ENCOUNTER — Emergency Department (HOSPITAL_COMMUNITY)
Admission: EM | Admit: 2013-12-03 | Discharge: 2013-12-03 | Disposition: A | Discharge status: Home / Self Care | Payer: Medicare HMO | Attending: Emergency Medicine | Admitting: Emergency Medicine

## 2013-12-03 ENCOUNTER — Encounter (HOSPITAL_COMMUNITY): Payer: Self-pay | Admitting: Emergency Medicine

## 2013-12-03 ENCOUNTER — Emergency Department (HOSPITAL_COMMUNITY): Payer: Medicare HMO

## 2013-12-03 DIAGNOSIS — D649 Anemia, unspecified: Secondary | ICD-10-CM | POA: Insufficient documentation

## 2013-12-03 DIAGNOSIS — G8929 Other chronic pain: Secondary | ICD-10-CM | POA: Diagnosis not present

## 2013-12-03 DIAGNOSIS — Z7982 Long term (current) use of aspirin: Secondary | ICD-10-CM | POA: Insufficient documentation

## 2013-12-03 DIAGNOSIS — I2510 Atherosclerotic heart disease of native coronary artery without angina pectoris: Secondary | ICD-10-CM | POA: Diagnosis not present

## 2013-12-03 DIAGNOSIS — E785 Hyperlipidemia, unspecified: Secondary | ICD-10-CM | POA: Diagnosis not present

## 2013-12-03 DIAGNOSIS — Z9889 Other specified postprocedural states: Secondary | ICD-10-CM | POA: Insufficient documentation

## 2013-12-03 DIAGNOSIS — Z79899 Other long term (current) drug therapy: Secondary | ICD-10-CM | POA: Insufficient documentation

## 2013-12-03 DIAGNOSIS — F411 Generalized anxiety disorder: Secondary | ICD-10-CM | POA: Insufficient documentation

## 2013-12-03 DIAGNOSIS — I10 Essential (primary) hypertension: Secondary | ICD-10-CM | POA: Diagnosis not present

## 2013-12-03 DIAGNOSIS — K219 Gastro-esophageal reflux disease without esophagitis: Secondary | ICD-10-CM | POA: Diagnosis not present

## 2013-12-03 DIAGNOSIS — IMO0001 Reserved for inherently not codable concepts without codable children: Secondary | ICD-10-CM | POA: Diagnosis not present

## 2013-12-03 DIAGNOSIS — I252 Old myocardial infarction: Secondary | ICD-10-CM | POA: Insufficient documentation

## 2013-12-03 DIAGNOSIS — Z8709 Personal history of other diseases of the respiratory system: Secondary | ICD-10-CM | POA: Insufficient documentation

## 2013-12-03 DIAGNOSIS — M25519 Pain in unspecified shoulder: Secondary | ICD-10-CM | POA: Diagnosis not present

## 2013-12-03 DIAGNOSIS — M25511 Pain in right shoulder: Secondary | ICD-10-CM

## 2013-12-03 MED ORDER — HYDROCODONE-ACETAMINOPHEN 5-325 MG PO TABS: 1.0000 | ORAL_TABLET | Freq: Four times a day (QID) | ORAL | Status: DC | PRN

## 2013-12-03 MED ORDER — ONDANSETRON 8 MG PO TBDP
8.0000 mg | ORAL_TABLET | Freq: Once | ORAL | Status: AC
  Administered 2013-12-03: 8 mg via ORAL
  Filled 2013-12-03: qty 1

## 2013-12-03 MED ORDER — OXYCODONE-ACETAMINOPHEN 5-325 MG PO TABS
2.0000 | ORAL_TABLET | Freq: Once | ORAL | Status: AC
Start: 2013-12-03 — End: 2013-12-03
  Administered 2013-12-03: 2 via ORAL
  Filled 2013-12-03: qty 2

## 2013-12-03 NOTE — ED Notes (Signed)
Hayley Miller, nursing director at bedside with patient and family.

## 2013-12-03 NOTE — ED Provider Notes (Signed)
CSN: [id number]     Arrival date & time 12/03/13  1228 History   First MD Initiated Contact with Patient 12/03/13 1340     Chief Complaint  Patient presents with  . Numbness  . Arm Pain    right     (Consider location/radiation/quality/duration/timing/severity/associated sxs/prior Treatment) HPI Comments: Patient is a 53 year old female history of hypertension, hyperlipidemia, chronic pain syndrome, depression, anxiety, fibromyalgia, coronary artery disease who presents today for evaluation of right arm pain. She reports the pain is a tingling type pain. She had brief tingling in her mouth last night which resolved and did not return today. She took Gabapentin prior to arrival without relief of her symtpoms. She reports that she cannot move her arm due to the pain. She denies history of DVT or PE, no long trips, recent surgeries. The patient denies any headache, dizziness, lightheadedness, chest pain, shortness of breath, fever, chills, nausea, vomiting.    The history is provided by the patient. No language interpreter was used.    Past Medical History  Diagnosis Date  . HTN (hypertension)   . HLD (hyperlipidemia)   . Chronic pain syndrome   . Hemorrhage     upper gastrointestional. GI bleed 10/09 due to NSAID  . Depression   . GERD (gastroesophageal reflux disease)   . Anxiety   . Fibromyalgia   . Rhinitis   . Myocardial infarction     x2  . Allergy   . Anemia   . Erosive esophagitis   . Cameron lesion, acute   . Hiatal hernia   . CAD (coronary artery disease)     a. s/p AL MI in 2006 tx with PTCA to LAD;  b. LHC (10/2009): lum irregs in LAD, o/w no CAD, EF 55-60%;  c. Myoview (07/2011):  no ischemia, EF 63%;  d. Echo (11/2011):  mild LVH, EF 60-65%, Gr 1 DD;  e.  Lexiscan Myoview (04/2013):  Inferior bowel atten artifact with inferoapical defect; no ischemia; EF 58% (low risk)   Past Surgical History  Procedure Laterality Date  . L breast - cyst removed  2002  . Cardiac  catheterization    . Upper gastrointestinal endoscopy    . Laparoscopic assisted vaginal hysterectomy N/A 05/08/2013    Procedure: LAPAROSCOPIC ASSISTED VAGINAL HYSTERECTOMY;  Surgeon: Olga Millers, MD;  Location: [facility];  Service: Gynecology;  Laterality: N/A;  . Laparoscopic bilateral salpingo oopherectomy N/A 05/08/2013    Procedure: LAPAROSCOPIC BILATERAL SALPINGO OOPHORECTOMY;  Surgeon: Olga Millers, MD;  Location: [facility];  Service: Gynecology;  Laterality: N/A;  . Cystoscopy N/A 05/08/2013    Procedure: CYSTOSCOPY;  Surgeon: Olga Millers, MD;  Location: [facility];  Service: Gynecology;  Laterality: N/A;  . Bladder suspension N/A 05/08/2013    Procedure: TRANSVAGINAL TAPE (TVT) PROCEDURE;  Surgeon: Olga Millers, MD;  Location: [facility];  Service: Gynecology;  Laterality: N/A;   Family History  Problem Relation Age of Onset  . Hypertension Mother   . Heart murmur Mother   . Colon polyps Mother   . Colon cancer Maternal Aunt   . Heart disease Son   . Heart disease Maternal Grandmother   . Diabetes Maternal Aunt   . Esophageal cancer Neg Hx   . Rectal cancer Neg Hx   . Stomach cancer Neg Hx    History  Substance Use Topics  . Smoking status: Passive Smoke Exposure - Never Smoker  . Smokeless tobacco: Never Used     Comment: Divorced, lives  with female domestic partner  . Alcohol Use: No     Comment: occasional   OB History   Grav Para Term Preterm Abortions TAB SAB Ect Mult Living                 Review of Systems  Constitutional: Negative for fever, chills and diaphoresis.  Respiratory: Negative for shortness of breath.   Cardiovascular: Negative for chest pain.  Gastrointestinal: Negative for nausea, vomiting and abdominal pain.  Musculoskeletal: Positive for arthralgias and myalgias.  Neurological: Negative for dizziness, facial asymmetry, light-headedness and headaches.  All other systems reviewed and are negative.     Allergies  Clonidine hydrochloride;  Ibuprofen; and Lovastatin  Home Medications   Prior to Admission medications   Medication Sig Start Date End Date Taking? Authorizing Provider  amLODipine (NORVASC) 5 MG tablet Take 5 mg by mouth daily.   Yes Historical Provider, MD  aspirin EC 81 MG tablet Take 81 mg by mouth daily.   Yes Historical Provider, MD  atenolol (TENORMIN) 50 MG tablet Take 50 mg by mouth 2 (two) times daily.   Yes Historical Provider, MD  diphenhydrAMINE (BENADRYL) 25 mg capsule Take 25 mg by mouth 2 (two) times daily.   Yes Historical Provider, MD  esomeprazole (NEXIUM) 40 MG capsule Take 40 mg by mouth daily at 12 noon.   Yes Historical Provider, MD  ferrous sulfate 325 (65 FE) MG tablet Take 325 mg by mouth daily with breakfast.   Yes Historical Provider, MD  gabapentin (NEURONTIN) 300 MG capsule Take 300 mg by mouth 3 (three) times daily.   Yes Historical Provider, MD  potassium chloride (K-DUR) 10 MEQ tablet Take 10 mEq by mouth daily.  05/01/12  Yes Rowe Clack, MD  pravastatin (PRAVACHOL) 40 MG tablet Take 1 tablet (40 mg total) by mouth at bedtime. 07/03/12  Yes Rowe Clack, MD  nitroGLYCERIN (NITROSTAT) 0.4 MG SL tablet Place 1 tablet (0.4 mg total) under the tongue every 5 (five) minutes as needed for chest pain. 06/09/12   Aleksei Plotnikov V, MD   BP 171/110  Pulse 82  Temp(Src) 98 F (36.7 C) (Oral)  Resp 18  SpO2 100%  LMP 06/07/2012 Physical Exam  Nursing note and vitals reviewed. Constitutional: She is oriented to person, place, and time. She appears well-developed and well-nourished. No distress.  HENT:  Head: Normocephalic and atraumatic.  Right Ear: External ear normal.  Left Ear: External ear normal.  Nose: Nose normal.  Mouth/Throat: Oropharynx is clear and moist.  Eyes: Conjunctivae and EOM are normal. Pupils are equal, round, and reactive to light.  Neck: Normal range of motion. No spinous process tenderness and no muscular tenderness present. No rigidity. No edema  present.  Cardiovascular: Normal rate, regular rhythm, normal heart sounds, intact distal pulses and normal pulses.   Pulses:      Radial pulses are 2+ on the right side, and 2+ on the left side.  Pulmonary/Chest: Effort normal and breath sounds normal. No stridor. No respiratory distress. She has no wheezes. She has no rales.  Abdominal: Soft. She exhibits no distension.  Musculoskeletal: Normal range of motion.  Patient is exquisitely tender to palpation over right shoulder. No rashes, lesions, swelling, bruising. Full range of motion of elbow, wrist.  Grip strength is decreased on the right do to pain.  Sensation intact. Compartment soft.  Neurological: She is alert and oriented to person, place, and time. She has normal strength.  Skin: Skin is warm and  dry. She is not diaphoretic. No erythema.  Psychiatric: She has a normal mood and affect. Her behavior is normal.    ED Course  Procedures (including critical care time) Labs Review Labs Reviewed - No data to display  Imaging Review Dg Shoulder Right  12/03/2013   CLINICAL DATA:  Right shoulder pain since last night  EXAM: RIGHT SHOULDER - 2+ VIEW  COMPARISON:  None.  FINDINGS: There is no evidence of fracture or dislocation. There is no evidence of arthropathy or other focal bone abnormality. Soft tissues are unremarkable. Patient is unable to raise arm for the axillary view.  IMPRESSION: Negative.   Electronically Signed   By: Conchita Paris M.D.   On: 12/03/2013 14:45     EKG Interpretation   Date/Time:  Monday December 03 2013 12:55:24 EDT Ventricular Rate:  80 PR Interval:  209 QRS Duration: 89 QT Interval:  471 QTC Calculation: 543 R Axis:   -76 Text Interpretation:  Sinus rhythm Borderline prolonged PR interval Left  axis deviation Low voltage, precordial leads Probable anteroseptal  infarct, old Prolonged QT interval Baseline wander in lead(s) V3 V5 No  significant change since last tracing Confirmed by [place]  ([id number]) on 12/03/2013 1:35:16 PM      MDM   Final diagnoses:  Right shoulder pain    Patient presents emergency department for evaluation of right shoulder pain. Patient denies any other symptoms including headache, dizziness, lightheadedness, chest pain, shortness of breath, diaphoresis. X-ray of shoulder is unremarkable. Patient is exquisitely tender to palpation and range of motion is limited for this reason. Sensation is intact. Patient is to followup with her primary care physician. Discussed reasons to return to emergency Department immediately. Vital signs stable for discharge. Discussed case with Dr. Wilson Singer who agrees with plan. Patient / Family / Caregiver informed of clinical course, understand medical decision-making process, and agree with plan.     Elwyn Lade, PA-C 12/07/13 [phone]

## 2013-12-03 NOTE — ED Notes (Signed)
Patient states that she is still having pain.  Reviewed discharge instructions and prescription.

## 2013-12-03 NOTE — Discharge Instructions (Signed)
Shoulder Pain The shoulder is the joint that connects your arms to your body. The bones that form the shoulder joint include the upper arm bone (humerus), the shoulder blade (scapula), and the collarbone (clavicle). The top of the humerus is shaped like a ball and fits into a rather flat socket on the scapula (glenoid cavity). A combination of muscles and strong, fibrous tissues that connect muscles to bones (tendons) support your shoulder joint and hold the ball in the socket. Small, fluid-filled sacs (bursae) are located in different areas of the joint. They act as cushions between the bones and the overlying soft tissues and help reduce friction between the gliding tendons and the bone as you move your arm. Your shoulder joint allows a wide range of motion in your arm. This range of motion allows you to do things like scratch your back or throw a ball. However, this range of motion also makes your shoulder more prone to pain from overuse and injury. Causes of shoulder pain can originate from both injury and overuse and usually can be grouped in the following four categories:  Redness, swelling, and pain (inflammation) of the tendon (tendinitis) or the bursae (bursitis).  Instability, such as a dislocation of the joint.  Inflammation of the joint (arthritis).  Broken bone (fracture). HOME CARE INSTRUCTIONS   Apply ice to the sore area.  Put ice in a plastic bag.  Place a towel between your skin and the bag.  Leave the ice on for 15-20 minutes, 3-4 times per [place] for the first 2 days, or as directed by your health care provider.  Stop using cold packs if they do not help with the pain.  If you have a shoulder sling or immobilizer, wear it as long as your caregiver instructs. Only remove it to shower or bathe. Move your arm as little as possible, but keep your hand moving to prevent swelling.  Squeeze a soft ball or foam pad as much as possible to help prevent swelling.  Only take  over-the-counter or prescription medicines for pain, discomfort, or fever as directed by your caregiver. SEEK MEDICAL CARE IF:   Your shoulder pain increases, or new pain develops in your arm, hand, or fingers.  Your hand or fingers become cold and numb.  Your pain is not relieved with medicines. SEEK IMMEDIATE MEDICAL CARE IF:   Your arm, hand, or fingers are numb or tingling.  Your arm, hand, or fingers are significantly swollen or turn white or blue. MAKE SURE YOU:   Understand these instructions.  Will watch your condition.  Will get help right away if you are not doing well or get worse. Document Released: 12/23/2004 Document Revised: 07/30/2013 Document Reviewed: 02/27/2011 [facility] Patient Information 2015 [place], [place]. This information is not intended to replace advice given to you by your health care provider. Make sure you discuss any questions you have with your health care provider.  Pain of Unknown Etiology (Pain Without a Known Cause) You have come to your caregiver because of pain. Pain can occur in any part of the body. Often there is not a definite cause. If your laboratory (blood or urine) work was normal and X-rays or other studies were normal, your caregiver may treat you without knowing the cause of the pain. An example of this is the headache. Most headaches are diagnosed by taking a history. This means your caregiver asks you questions about your headaches. Your caregiver determines a treatment based on your answers. Usually testing done  for headaches is normal. Often testing is not done unless there is no response to medications. Regardless of where your pain is located today, you can be given medications to make you comfortable. If no physical cause of pain can be found, most cases of pain will gradually leave as suddenly as they came.  If you have a painful condition and no reason can be found for the pain, it is important that you follow up with your caregiver.  If the pain becomes worse or does not go away, it may be necessary to repeat tests and look further for a possible cause.  Only take over-the-counter or prescription medicines for pain, discomfort, or fever as directed by your caregiver.  For the protection of your privacy, test results cannot be given over the phone. Make sure you receive the results of your test. Ask how these results are to be obtained if you have not been informed. It is your responsibility to obtain your test results.  You may continue all activities unless the activities cause more pain. When the pain lessens, it is important to gradually resume normal activities. Resume activities by beginning slowly and gradually increasing the intensity and duration of the activities or exercise. During periods of severe pain, bed rest may be helpful. Lie or sit in any position that is comfortable.  Ice used for acute (sudden) conditions may be effective. Use a large plastic bag filled with ice and wrapped in a towel. This may provide pain relief.  See your caregiver for continued problems. Your caregiver can help or refer you for exercises or physical therapy if necessary. If you were given medications for your condition, do not drive, operate machinery or power tools, or sign legal documents for 24 hours. Do not drink alcohol, take sleeping pills, or take other medications that may interfere with treatment. See your caregiver immediately if you have pain that is becoming worse and not relieved by medications. Document Released: 12/08/2000 Document Revised: 01/03/2013 Document Reviewed: 03/15/2005 [facility] Patient Information 2015 [place], [place]. This information is not intended to replace advice given to you by your health care provider. Make sure you discuss any questions you have with your health care provider.

## 2013-12-03 NOTE — ED Notes (Addendum)
Initial contact-pt c/o right arm pain and states "I can't move it." C/o numbness and tingling. Reports mouth tingling last night. Took Gabapentin today before coming. Has had 2 MIs in the past. Not on blood thinners. Denies previous arm injury. No hx DVT. No visible swelling. Limited ROM on right arm. Neurologically intact. Face symmetrical. Awaiting MD/PA.

## 2013-12-08 NOTE — ED Provider Notes (Signed)
Medical screening examination/treatment/procedure(s) were performed by non-physician practitioner and as supervising physician I was immediately available for consultation/collaboration.   EKG Interpretation   Date/Time:  Monday December 03 2013 12:55:24 EDT Ventricular Rate:  80 PR Interval:  209 QRS Duration: 89 QT Interval:  471 QTC Calculation: 543 R Axis:   -76 Text Interpretation:  Sinus rhythm Borderline prolonged PR interval Left  axis deviation Low voltage, precordial leads Probable anteroseptal  infarct, old Prolonged QT interval Baseline wander in lead(s) V3 V5 No  significant change since last tracing Confirmed by Wilson Singer  MD, [place]  ([id number]) on 12/03/2013 1:35:16 PM       Virgel Manifold, MD 12/08/13 1704

## 2014-01-22 ENCOUNTER — Other Ambulatory Visit (INDEPENDENT_AMBULATORY_CARE_PROVIDER_SITE_OTHER): Payer: Medicare HMO

## 2014-01-22 ENCOUNTER — Ambulatory Visit (INDEPENDENT_AMBULATORY_CARE_PROVIDER_SITE_OTHER): Payer: Medicare HMO | Admitting: Internal Medicine

## 2014-01-22 ENCOUNTER — Encounter: Payer: Self-pay | Admitting: Internal Medicine

## 2014-01-22 VITALS — BP 152/82 | HR 74 | Temp 97.8°F | Resp 12 | Ht 64.0 in | Wt 159.0 lb

## 2014-01-22 DIAGNOSIS — F32A Depression, unspecified: Secondary | ICD-10-CM

## 2014-01-22 DIAGNOSIS — I10 Essential (primary) hypertension: Secondary | ICD-10-CM | POA: Diagnosis not present

## 2014-01-22 DIAGNOSIS — M797 Fibromyalgia: Secondary | ICD-10-CM | POA: Diagnosis not present

## 2014-01-22 DIAGNOSIS — Z23 Encounter for immunization: Secondary | ICD-10-CM

## 2014-01-22 DIAGNOSIS — I2510 Atherosclerotic heart disease of native coronary artery without angina pectoris: Secondary | ICD-10-CM | POA: Diagnosis not present

## 2014-01-22 DIAGNOSIS — E785 Hyperlipidemia, unspecified: Secondary | ICD-10-CM

## 2014-01-22 DIAGNOSIS — F329 Major depressive disorder, single episode, unspecified: Secondary | ICD-10-CM

## 2014-01-22 LAB — LIPID PANEL
CHOL/HDL RATIO: 6
CHOLESTEROL: 218 mg/dL — AB (ref 0–200)
HDL: 37.3 mg/dL — ABNORMAL LOW (ref >39.00)
LDL CALC: 145 mg/dL — AB (ref 0–99)
NonHDL: 180.7
TRIGLYCERIDES: 181 mg/dL — AB (ref 0.0–149.0)
VLDL: 36.2 mg/dL (ref 0.0–40.0)

## 2014-01-22 LAB — HEMOGLOBIN A1C: Hgb A1c MFr Bld: 5.3 % (ref 4.6–6.5)

## 2014-01-22 LAB — BASIC METABOLIC PANEL
BUN: 15 mg/dL (ref 6–23)
CHLORIDE: 104 meq/L (ref 96–112)
CO2: 26 meq/L (ref 19–32)
Calcium: 10.5 mg/dL (ref 8.4–10.5)
Creatinine, Ser: 1.1 mg/dL (ref 0.4–1.2)
GFR: 69.31 mL/min (ref >60.00)
Glucose, Bld: 94 mg/dL (ref 70–99)
Potassium: 4 mEq/L (ref 3.5–5.1)
SODIUM: 138 meq/L (ref 135–145)

## 2014-01-22 MED ORDER — AMLODIPINE BESYLATE 5 MG PO TABS: 5.0000 mg | ORAL_TABLET | Freq: Every day | ORAL | Status: DC

## 2014-01-22 MED ORDER — LEVOCETIRIZINE DIHYDROCHLORIDE 5 MG PO TABS: 5.0000 mg | ORAL_TABLET | Freq: Every evening | ORAL | Status: DC

## 2014-01-22 MED ORDER — PREGABALIN 75 MG PO CAPS: 75.0000 mg | ORAL_CAPSULE | Freq: Two times a day (BID) | ORAL | Status: DC

## 2014-01-22 MED ORDER — NITROGLYCERIN 0.4 MG SL SUBL: 0.4000 mg | SUBLINGUAL_TABLET | SUBLINGUAL | Status: DC | PRN

## 2014-01-22 MED ORDER — PRAVASTATIN SODIUM 40 MG PO TABS: 40.0000 mg | ORAL_TABLET | Freq: Every day | ORAL | Status: DC

## 2014-01-22 MED ORDER — ESOMEPRAZOLE MAGNESIUM 40 MG PO CPDR: 40.0000 mg | DELAYED_RELEASE_CAPSULE | Freq: Every day | ORAL | Status: DC

## 2014-01-22 MED ORDER — DULOXETINE HCL 30 MG PO CPEP: 30.0000 mg | ORAL_CAPSULE | Freq: Every day | ORAL | Status: DC

## 2014-01-22 NOTE — Patient Instructions (Signed)
Stop taking atenolol. Also stop taking gabapentin. Stop taking potassium. We will check your blood levels today.   We will start cymbalta for your fibromyalgia. This will take 3-4 weeks until you notice the full effect so keep taking it even if you do not notice a difference.   We will also give you lyrica for the pain and fibromyalgia. You can take 1 pill up to 2 times per [place]. This should work in the first 2-3 days you are taking it.   For the sinuses we will have you try xyzal. Take 1 pill per [place] to help with the sinuses. You do not need antibiotics at this time.

## 2014-01-22 NOTE — Progress Notes (Signed)
Pre visit review using our clinic review tool, if applicable. No additional management support is needed unless otherwise documented below in the visit note. 

## 2014-01-23 NOTE — Assessment & Plan Note (Addendum)
Patient thinks that her atenolol is causing her itching and will stop. She will continue amlodipine as it was started after the itching started. She will also stop potassium as she is not on a BP med that lowers it. She does have history of hypokalemia while on HCTZ 25 mg. Check BMP today.

## 2014-01-23 NOTE — Assessment & Plan Note (Signed)
Patient not doing well with gabapentin. Will start cymbalta and lyrica for better pain control and mood.

## 2014-01-23 NOTE — Assessment & Plan Note (Signed)
Continue pravastatin and check lipid panel today.

## 2014-01-23 NOTE — Assessment & Plan Note (Signed)
Taking ASA, statin. Needs refill of nitro rx. Denies anginal symptoms at this time.

## 2014-01-23 NOTE — Progress Notes (Signed)
   Subjective:    Patient ID: Hayley Miller, female    DOB: Apr 04, 1960, 53 y.o.   MRN: [id number]  HPI The patient is a 52 YO female who is coming in today to establish care. She has PMH of HTN, fibromyalgia, CAD. She thinks that her BP medication is causing her to itch and it has for some time. She is still taking the medicines but she is frustrated. Her fibromyalgia is not well controlled and she does not think the gabapentin is helping. She denies chest pains, SOB, abdominal pain.   Review of Systems  Constitutional: Negative for fever, activity change, appetite change, fatigue and unexpected weight change.  HENT: Negative.   Eyes: Negative.   Respiratory: Negative for cough, chest tightness and shortness of breath.   Cardiovascular: Negative for chest pain, palpitations and leg swelling.  Gastrointestinal: Negative for abdominal pain, diarrhea, constipation and abdominal distention.  Musculoskeletal: Positive for back pain and myalgias. Negative for arthralgias and gait problem.  Skin: Negative.   Neurological: Negative for dizziness, weakness, light-headedness and headaches.  Psychiatric/Behavioral: Positive for dysphoric mood. The patient is nervous/anxious.       Objective:   Physical Exam  Constitutional: She is oriented to person, place, and time. She appears well-developed and well-nourished. No distress.  HENT:  Head: Normocephalic and atraumatic.  Eyes: EOM are normal.  Neck: Normal range of motion.  Cardiovascular: Normal rate and regular rhythm.   Pulmonary/Chest: Effort normal and breath sounds normal.  Abdominal: Soft. Bowel sounds are normal.  Neurological: She is alert and oriented to person, place, and time. Coordination normal.  Skin: Skin is warm and dry.  No rash or erythema but she is having some itching. Not scratching during our visit.   Filed Vitals:   01/22/14 1403  BP: 152/82  Pulse: 74  Temp: 97.8 F (36.6 C)  TempSrc: Oral  Resp: 12  Height: 5\' 4"   (1.626 m)  Weight: 159 lb (72.122 kg)  SpO2: 98%      Assessment & Plan:

## 2014-01-23 NOTE — Assessment & Plan Note (Signed)
Affect a little flat, hopefully cymbalta will also help with some of her mood disturbance.

## 2014-02-06 ENCOUNTER — Telehealth: Payer: Self-pay

## 2014-02-06 NOTE — Telephone Encounter (Signed)
PA initiated via cover my meds. °

## 2014-02-07 NOTE — Telephone Encounter (Signed)
PA approved.

## 2014-02-27 ENCOUNTER — Other Ambulatory Visit: Payer: Self-pay | Admitting: Internal Medicine

## 2014-03-05 ENCOUNTER — Telehealth: Payer: Self-pay | Admitting: Internal Medicine

## 2014-03-05 ENCOUNTER — Other Ambulatory Visit: Payer: Self-pay | Admitting: Geriatric Medicine

## 2014-03-05 MED ORDER — DULOXETINE HCL 30 MG PO CPEP
30.0000 mg | ORAL_CAPSULE | Freq: Every day | ORAL | Status: DC
Start: 2014-03-05 — End: 2014-03-11

## 2014-03-05 NOTE — Telephone Encounter (Signed)
Sent to [facility]

## 2014-03-05 NOTE — Telephone Encounter (Signed)
Pt has appt sched 12/14 with PCP, pt is out of medication, can you refill: DULoxetine (CYMBALTA) 30 MG capsule [Pharmacy Med Name: DULOXETINE HCL DR 30 MG CAP]

## 2014-03-11 ENCOUNTER — Ambulatory Visit (INDEPENDENT_AMBULATORY_CARE_PROVIDER_SITE_OTHER): Payer: Medicare HMO | Admitting: Internal Medicine

## 2014-03-11 ENCOUNTER — Encounter: Payer: Self-pay | Admitting: Internal Medicine

## 2014-03-11 ENCOUNTER — Other Ambulatory Visit (INDEPENDENT_AMBULATORY_CARE_PROVIDER_SITE_OTHER): Payer: Medicare HMO

## 2014-03-11 VITALS — BP 138/92 | HR 95 | Temp 97.9°F | Resp 18 | Ht 64.0 in | Wt 158.0 lb

## 2014-03-11 DIAGNOSIS — R55 Syncope and collapse: Secondary | ICD-10-CM

## 2014-03-11 DIAGNOSIS — M797 Fibromyalgia: Secondary | ICD-10-CM

## 2014-03-11 DIAGNOSIS — H9312 Tinnitus, left ear: Secondary | ICD-10-CM

## 2014-03-11 DIAGNOSIS — H9319 Tinnitus, unspecified ear: Secondary | ICD-10-CM | POA: Insufficient documentation

## 2014-03-11 DIAGNOSIS — Z23 Encounter for immunization: Secondary | ICD-10-CM

## 2014-03-11 DIAGNOSIS — Z418 Encounter for other procedures for purposes other than remedying health state: Secondary | ICD-10-CM

## 2014-03-11 DIAGNOSIS — Z299 Encounter for prophylactic measures, unspecified: Secondary | ICD-10-CM

## 2014-03-11 LAB — CBC
HEMATOCRIT: 46 % (ref 36.0–46.0)
Hemoglobin: 15 g/dL (ref 12.0–15.0)
MCHC: 32.6 g/dL (ref 30.0–36.0)
MCV: 93.1 fl (ref 78.0–100.0)
Platelets: 356 10*3/uL (ref 150.0–400.0)
RBC: 4.94 Mil/uL (ref 3.87–5.11)
RDW: 14.1 % (ref 11.5–15.5)
WBC: 11.4 10*3/uL — ABNORMAL HIGH (ref 4.0–10.5)

## 2014-03-11 MED ORDER — DULOXETINE HCL 60 MG PO CPEP: 60.0000 mg | ORAL_CAPSULE | Freq: Every day | ORAL | Status: DC

## 2014-03-11 NOTE — Patient Instructions (Addendum)
We are going to check your blood counts today. We will call you back with the results.   We are going to increase your Cymbalta(also called duloxetine) to 60 mg. Until your pills are gone you can take 2 pills a Hacker. When you get the new prescription make sure to ask for the stronger strength. Once you get the new prescription take 1 pill a Jaster.  We did check your EKG today which did not show any problems. If you do pass out again we would like you to go to the emergency room for evaluation.  We will see you back in about 3-6 months.  Fibromyalgia Fibromyalgia is a disorder that is often misunderstood. It is associated with muscular pains and tenderness that comes and goes. It is often associated with fatigue and sleep disturbances. Though it tends to be long-lasting, fibromyalgia is not life-threatening. CAUSES  The exact cause of fibromyalgia is unknown. People with certain gene types are predisposed to developing fibromyalgia and other conditions. Certain factors can play a role as triggers, such as:  Spine disorders.  Arthritis.  Severe injury (trauma) and other physical stressors.  Emotional stressors. SYMPTOMS   The main symptom is pain and stiffness in the muscles and joints, which can vary over time.  Sleep and fatigue problems. Other related symptoms may include:  Bowel and bladder problems.  Headaches.  Visual problems.  Problems with odors and noises.  Depression or mood changes.  Painful periods (dysmenorrhea).  Dryness of the skin or eyes. DIAGNOSIS  There are no specific tests for diagnosing fibromyalgia. Patients can be diagnosed accurately from the specific symptoms they have. The diagnosis is made by determining that nothing else is causing the problems. TREATMENT  There is no cure. Management includes medicines and an active, healthy lifestyle. The goal is to enhance physical fitness, decrease pain, and improve sleep. HOME CARE INSTRUCTIONS   Only take  over-the-counter or prescription medicines as directed by your caregiver. Sleeping pills, tranquilizers, and pain medicines may make your problems worse.  Low-impact aerobic exercise is very important and advised for treatment. At first, it may seem to make pain worse. Gradually increasing your tolerance will overcome this feeling.  Learning relaxation techniques and how to control stress will help you. Biofeedback, visual imagery, hypnosis, muscle relaxation, yoga, and meditation are all options.  Anti-inflammatory medicines and physical therapy may provide short-term help.  Acupuncture or massage treatments may help.  Take muscle relaxant medicines as suggested by your caregiver.  Avoid stressful situations.  Plan a healthy lifestyle. This includes your diet, sleep, rest, exercise, and friends.  Find and practice a hobby you enjoy.  Join a fibromyalgia support group for interaction, ideas, and sharing advice. This may be helpful. SEEK MEDICAL CARE IF:  You are not having good results or improvement from your treatment. FOR MORE INFORMATION  National Fibromyalgia Association: www.fmaware.[place]: www.arthritis.org Document Released: 03/15/2005 Document Revised: 06/07/2011 Document Reviewed: 06/25/2009 [facility] Patient Information 2015 [place], [place]. This information is not intended to replace advice given to you by your health care provider. Make sure you discuss any questions you have with your health care provider.

## 2014-03-11 NOTE — Assessment & Plan Note (Signed)
Patient much improved with Lyrica 75 mg twice a [place], also taking Cymbalta 30 mg daily. Will increase Cymbalta to 60 mg daily for maximum benefit. Advised that exercise is also very beneficial and fibromyalgia to help establish a state of normal.

## 2014-03-11 NOTE — Addendum Note (Signed)
Addended by: Vertell Novak A on: 03/11/2014 11:20 AM   Modules accepted: Orders

## 2014-03-11 NOTE — Assessment & Plan Note (Signed)
Most likely be vasovagal syncope. No prior episodes and with poor oral intake for 2-3 days prior to event. Did speak with her about the fact that if she has recurrent syncope she does need to seek medical attention right after it happens. No abnormality on EKG as noted. QTc interval previously prolonged however normal on today's exam.

## 2014-03-11 NOTE — Assessment & Plan Note (Signed)
Ears examined, no signs of wax either ear, TMs both normal. Will refer to ENT. She is also having some sinus tenderness, refuses nasal corticosteroids. Currently taking Xyzal with little relief. She does have a tooth that is broken on the right upper side of her mouth however no signs of infection or abscess. She claims it is nontender with eating.

## 2014-03-11 NOTE — Progress Notes (Signed)
   Subjective:    Patient ID: Hayley Miller, female    DOB: 1960-10-21, 53 y.o.   MRN: [id number]  HPI The patient is a 53 year old female comes in today for follow-up of her fibromyalgia. We started Cymbalta and Lyrica on her about 2 months ago. She has been taking these faithfully. She states that they were very helpful at the beginning and now are moderately helpful. She also is having some tinnitus in her left ear as well as some sinus congestion. She has tried nose sprays in the past which were not effective and she does not wish to retry, she is currently taking Xyzal and she states this does not help. She also had an episode about 2 weeks ago where she was not feeling well for 2-3 days then stood up to go to the bathroom felt woozy and dizzy and while she was trying to return to her bed and passed out. She denies any previous episodes of syncope and no episodes since that occurrence. She did not feel any palpitations, chest pains.  Review of Systems  Constitutional: Negative for fever, activity change, appetite change, fatigue and unexpected weight change.  HENT: Positive for congestion, dental problem and tinnitus.   Eyes: Negative.   Respiratory: Negative for cough, chest tightness and shortness of breath.   Cardiovascular: Negative for chest pain, palpitations and leg swelling.  Gastrointestinal: Negative for abdominal pain, diarrhea, constipation and abdominal distention.  Musculoskeletal: Positive for myalgias and back pain. Negative for arthralgias and gait problem.  Skin: Negative.   Neurological: Positive for syncope. Negative for dizziness, tremors, seizures, weakness, light-headedness and headaches.      Objective:   Physical Exam  Constitutional: She is oriented to person, place, and time. She appears well-developed and well-nourished. No distress.  HENT:  Head: Normocephalic and atraumatic.  Right Ear: External ear normal.  Left Ear: External ear normal.  Mouth/Throat:  Oropharynx is clear and moist.  Tooth right upper molar cracked, no surrounding erythema, abscess, swelling.  Eyes: EOM are normal.  Neck: Normal range of motion.  Cardiovascular: Normal rate and regular rhythm.   No murmur heard. Pulmonary/Chest: Effort normal and breath sounds normal.  Abdominal: Soft. Bowel sounds are normal.  Neurological: She is alert and oriented to person, place, and time. Coordination normal.  Skin: Skin is warm and dry.   Filed Vitals:   03/11/14 0827  BP: 138/92  Pulse: 95  Temp: 97.9 F (36.6 C)  TempSrc: Oral  Resp: 18  Height: 5\' 4"  (1.626 m)  Weight: 158 lb (71.668 kg)  SpO2: 96%   EKG: Rate 81, intervals normal, QTC 412, rhythm regular, axis normal. No ST abnormalities. This does represent a change from prior, QTC was elongated on previous ekg however was normal prior to that. No change from ekg from 2/15    Assessment & Plan:

## 2014-03-11 NOTE — Progress Notes (Signed)
Pre visit review using our clinic review tool, if applicable. No additional management support is needed unless otherwise documented below in the visit note. 

## 2014-03-25 ENCOUNTER — Ambulatory Visit: Payer: Self-pay | Admitting: Internal Medicine

## 2014-04-01 ENCOUNTER — Ambulatory Visit: Payer: Self-pay | Admitting: Internal Medicine

## 2014-04-10 ENCOUNTER — Other Ambulatory Visit: Payer: Self-pay | Admitting: Otolaryngology

## 2014-04-10 DIAGNOSIS — H918X2 Other specified hearing loss, left ear: Secondary | ICD-10-CM

## 2014-04-10 DIAGNOSIS — IMO0001 Reserved for inherently not codable concepts without codable children: Secondary | ICD-10-CM

## 2014-04-24 ENCOUNTER — Other Ambulatory Visit: Payer: Self-pay | Admitting: Internal Medicine

## 2014-04-26 ENCOUNTER — Ambulatory Visit: Payer: Self-pay | Admitting: Internal Medicine

## 2014-05-03 ENCOUNTER — Ambulatory Visit
Admission: RE | Admit: 2014-05-03 | Discharge: 2014-05-03 | Disposition: A | Discharge status: Home / Self Care | Payer: PPO | Source: Ambulatory Visit | Attending: Otolaryngology | Admitting: Otolaryngology

## 2014-05-03 DIAGNOSIS — H918X2 Other specified hearing loss, left ear: Secondary | ICD-10-CM

## 2014-05-03 DIAGNOSIS — IMO0001 Reserved for inherently not codable concepts without codable children: Secondary | ICD-10-CM

## 2014-05-03 MED ORDER — GADOBENATE DIMEGLUMINE 529 MG/ML IV SOLN
14.0000 mL | Freq: Once | INTRAVENOUS | Status: AC | PRN
  Administered 2014-05-03: 14 mL via INTRAVENOUS

## 2014-05-07 ENCOUNTER — Telehealth: Payer: Self-pay | Admitting: *Deleted

## 2014-05-07 ENCOUNTER — Ambulatory Visit: Payer: Self-pay | Admitting: Internal Medicine

## 2014-05-07 NOTE — Telephone Encounter (Signed)
[facility] - Client TELEPHONE ADVICE RECORD [facility] Patient Name: Saydie Streetman Gender: Female DOB: 06/23/1960 Age: 54 Y 23 D Return Phone Number: [phone] (Primary) Address: City/State/Zip: [place] [address] Client [facility] - Client Client Site [place] Physician [place], [place] Type Call Caller Name Bedelia Salvucci Phone Number [phone] Relationship To Patient Self Is this call to report lab results? No Call Type General Information Initial Comment Caller states she would like to cancel her for tomorrow May 07, 2014 appointment. General Information Type Message Only Nurse Assessment Guidelines Guideline Title Affirmed Question Affirmed Notes Nurse Date/Time (Eastern Time) Disp. Time Eilene Ghazi Time) Disposition Final User 05/06/2014 5:19:22 PM General Information Provided Yes Luanne Bras After Care Instructions Given Call Event Type User Date / Time Description

## 2014-05-20 ENCOUNTER — Other Ambulatory Visit: Payer: Self-pay | Admitting: Geriatric Medicine

## 2014-05-20 MED ORDER — PREGABALIN 75 MG PO CAPS
75.0000 mg | ORAL_CAPSULE | Freq: Two times a day (BID) | ORAL | Status: DC
Start: 2014-05-20 — End: 2014-09-23

## 2014-05-27 ENCOUNTER — Telehealth: Payer: Self-pay | Admitting: Internal Medicine

## 2014-05-27 ENCOUNTER — Other Ambulatory Visit: Payer: Self-pay | Admitting: *Deleted

## 2014-05-27 NOTE — Telephone Encounter (Signed)
Pt called in said that [facility] is needing a PA for her esomeprazole (NEXIUM) 40 MG capsule [id number]

## 2014-05-27 NOTE — Telephone Encounter (Signed)
Left msg on triage needing refill on her nexium. Called pt back no answer LMOM [facility]...Hayley Miller

## 2014-05-31 ENCOUNTER — Telehealth: Payer: Self-pay | Admitting: Geriatric Medicine

## 2014-05-31 NOTE — Telephone Encounter (Signed)
PA approved for Nexium until 123/31/2016

## 2014-08-21 ENCOUNTER — Other Ambulatory Visit: Payer: Self-pay | Admitting: Internal Medicine

## 2014-08-28 ENCOUNTER — Other Ambulatory Visit: Payer: Self-pay | Admitting: Internal Medicine

## 2014-09-23 ENCOUNTER — Other Ambulatory Visit: Payer: Self-pay | Admitting: Internal Medicine

## 2014-10-15 ENCOUNTER — Encounter (HOSPITAL_COMMUNITY): Payer: Self-pay | Admitting: *Deleted

## 2014-10-15 DIAGNOSIS — Z9889 Other specified postprocedural states: Secondary | ICD-10-CM | POA: Diagnosis not present

## 2014-10-15 DIAGNOSIS — I2510 Atherosclerotic heart disease of native coronary artery without angina pectoris: Secondary | ICD-10-CM | POA: Diagnosis not present

## 2014-10-15 DIAGNOSIS — Z79899 Other long term (current) drug therapy: Secondary | ICD-10-CM | POA: Insufficient documentation

## 2014-10-15 DIAGNOSIS — Z3202 Encounter for pregnancy test, result negative: Secondary | ICD-10-CM | POA: Insufficient documentation

## 2014-10-15 DIAGNOSIS — F329 Major depressive disorder, single episode, unspecified: Secondary | ICD-10-CM | POA: Diagnosis not present

## 2014-10-15 DIAGNOSIS — R51 Headache: Secondary | ICD-10-CM | POA: Diagnosis present

## 2014-10-15 DIAGNOSIS — E785 Hyperlipidemia, unspecified: Secondary | ICD-10-CM | POA: Insufficient documentation

## 2014-10-15 DIAGNOSIS — Z8719 Personal history of other diseases of the digestive system: Secondary | ICD-10-CM | POA: Insufficient documentation

## 2014-10-15 DIAGNOSIS — I252 Old myocardial infarction: Secondary | ICD-10-CM | POA: Diagnosis not present

## 2014-10-15 DIAGNOSIS — K219 Gastro-esophageal reflux disease without esophagitis: Secondary | ICD-10-CM | POA: Insufficient documentation

## 2014-10-15 DIAGNOSIS — M797 Fibromyalgia: Secondary | ICD-10-CM | POA: Diagnosis not present

## 2014-10-15 DIAGNOSIS — Z720 Tobacco use: Secondary | ICD-10-CM | POA: Diagnosis not present

## 2014-10-15 DIAGNOSIS — Z7982 Long term (current) use of aspirin: Secondary | ICD-10-CM | POA: Diagnosis not present

## 2014-10-15 DIAGNOSIS — I10 Essential (primary) hypertension: Secondary | ICD-10-CM | POA: Diagnosis not present

## 2014-10-15 DIAGNOSIS — G894 Chronic pain syndrome: Secondary | ICD-10-CM | POA: Insufficient documentation

## 2014-10-15 DIAGNOSIS — Z862 Personal history of diseases of the blood and blood-forming organs and certain disorders involving the immune mechanism: Secondary | ICD-10-CM | POA: Insufficient documentation

## 2014-10-15 NOTE — ED Notes (Signed)
Patient presents stating she started with a headache yesterday.  Takes over the counter meds Tyenol without relief  Has history of the same  Right BP 165/112  Left arm 149/117

## 2014-10-16 ENCOUNTER — Encounter (HOSPITAL_COMMUNITY): Payer: Self-pay | Admitting: Radiology

## 2014-10-16 ENCOUNTER — Emergency Department (HOSPITAL_COMMUNITY)
Admission: EM | Admit: 2014-10-16 | Discharge: 2014-10-16 | Disposition: A | Discharge status: Home / Self Care | Payer: PPO | Attending: Emergency Medicine | Admitting: Emergency Medicine

## 2014-10-16 ENCOUNTER — Emergency Department (HOSPITAL_COMMUNITY): Payer: PPO

## 2014-10-16 DIAGNOSIS — R519 Headache, unspecified: Secondary | ICD-10-CM

## 2014-10-16 DIAGNOSIS — I10 Essential (primary) hypertension: Secondary | ICD-10-CM

## 2014-10-16 DIAGNOSIS — R51 Headache: Secondary | ICD-10-CM

## 2014-10-16 MED ORDER — SODIUM CHLORIDE 0.9 % IV SOLN
Freq: Once | INTRAVENOUS | Status: AC
  Administered 2014-10-16: 05:00:00 via INTRAVENOUS

## 2014-10-16 MED ORDER — DIPHENHYDRAMINE HCL 50 MG/ML IJ SOLN
12.5000 mg | Freq: Once | INTRAMUSCULAR | Status: AC
Start: 2014-10-16 — End: 2014-10-16
  Administered 2014-10-16: 12.5 mg via INTRAVENOUS
  Filled 2014-10-16: qty 1

## 2014-10-16 MED ORDER — ONDANSETRON HCL 4 MG/2ML IJ SOLN
4.0000 mg | Freq: Once | INTRAMUSCULAR | Status: AC
  Administered 2014-10-16: 4 mg via INTRAVENOUS
  Filled 2014-10-16: qty 2

## 2014-10-16 MED ORDER — KETOROLAC TROMETHAMINE 30 MG/ML IJ SOLN
30.0000 mg | Freq: Once | INTRAMUSCULAR | Status: AC
  Administered 2014-10-16: 30 mg via INTRAMUSCULAR
  Filled 2014-10-16: qty 1

## 2014-10-16 NOTE — ED Notes (Signed)
Patient transported to CT 

## 2014-10-16 NOTE — Discharge Instructions (Signed)
Your head ct Scan is normal you were given IV fluids and IV medications including Benadryl, Toradol, Zofran that decreased you pain and blood pressure. Please make an appointment with your PCP fro follow up care. Make sure to take your medications on a regular basis

## 2014-10-16 NOTE — ED Provider Notes (Signed)
CSN: [id number]     Arrival date & time 10/15/14  2246 History   First MD Initiated Contact with Patient 10/16/14 0246     Chief Complaint  Patient presents with  . Headache     (Consider location/radiation/quality/duration/timing/severity/associated sxs/prior Treatment) HPI Comments: Patient states she's had a global headache for the past 2 days.  She's tried taking Tylenol every 4 hours without any relief.  She reports that this happens frequently when her blood pressure goes up.  She states that she's been compliant with her blood pressure medications, new changes in medications with some nausea but no vomiting, some facial or sinus pressure but no rhinitis.  She has not contacted her primary care physician due to this headache. It is similar in nature to previous headaches  Patient is a 54 y.o. female presenting with headaches. The history is provided by the patient.  Headache Pain location:  Generalized Quality:  Dull Radiates to:  Does not radiate Severity currently:  8/10 Severity at highest:  5/10 Onset quality:  Unable to specify Duration:  2 days Timing:  Constant Progression:  Unchanged Chronicity:  Recurrent Similar to prior headaches: yes   Context: not activity, not exposure to bright light, not eating, not stress and not straining   Relieved by:  Nothing Worsened by:  Nothing Ineffective treatments:  Acetaminophen Associated symptoms: congestion, nausea and photophobia   Associated symptoms: no cough, no dizziness, no drainage, no ear pain, no eye pain, no facial pain, no fever, no hearing loss, no loss of balance, no near-syncope, no neck pain, no numbness, no sinus pressure, no visual change and no weakness   Congestion:    Location:  Nasal Nausea:    Severity:  Mild   Onset quality:  Unable to specify   Timing:  Intermittent   Past Medical History  Diagnosis Date  . HTN (hypertension)   . HLD (hyperlipidemia)   . Chronic pain syndrome   . Hemorrhage    upper gastrointestional. GI bleed 10/09 due to NSAID  . Depression   . GERD (gastroesophageal reflux disease)   . Anxiety   . Fibromyalgia   . Rhinitis   . Myocardial infarction     x2  . Allergy   . Anemia   . Erosive esophagitis   . Cameron lesion, acute   . Hiatal hernia   . CAD (coronary artery disease)     a. s/p AL MI in 2006 tx with PTCA to LAD;  b. LHC (10/2009): lum irregs in LAD, o/w no CAD, EF 55-60%;  c. Myoview (07/2011):  no ischemia, EF 63%;  d. Echo (11/2011):  mild LVH, EF 60-65%, Gr 1 DD;  e.  Lexiscan Myoview (04/2013):  Inferior bowel atten artifact with inferoapical defect; no ischemia; EF 58% (low risk)   Past Surgical History  Procedure Laterality Date  . L breast - cyst removed  2002  . Cardiac catheterization    . Upper gastrointestinal endoscopy    . Laparoscopic assisted vaginal hysterectomy N/A 05/08/2013    Procedure: LAPAROSCOPIC ASSISTED VAGINAL HYSTERECTOMY;  Surgeon: Olga Millers, MD;  Location: [facility];  Service: Gynecology;  Laterality: N/A;  . Laparoscopic bilateral salpingo oopherectomy N/A 05/08/2013    Procedure: LAPAROSCOPIC BILATERAL SALPINGO OOPHORECTOMY;  Surgeon: Olga Millers, MD;  Location: [facility];  Service: Gynecology;  Laterality: N/A;  . Cystoscopy N/A 05/08/2013    Procedure: CYSTOSCOPY;  Surgeon: Olga Millers, MD;  Location: [facility];  Service: Gynecology;  Laterality: N/A;  .  Bladder suspension N/A 05/08/2013    Procedure: TRANSVAGINAL TAPE (TVT) PROCEDURE;  Surgeon: Olga Millers, MD;  Location: [facility];  Service: Gynecology;  Laterality: N/A;   Family History  Problem Relation Age of Onset  . Hypertension Mother   . Heart murmur Mother   . Colon polyps Mother   . Colon cancer Maternal Aunt   . Heart disease Son   . Heart disease Maternal Grandmother   . Diabetes Maternal Aunt   . Esophageal cancer Neg Hx   . Rectal cancer Neg Hx   . Stomach cancer Neg Hx    History  Substance Use Topics  . Smoking status: Current Every  Bucker Smoker  . Smokeless tobacco: Never Used     Comment: Divorced, lives with female domestic partner  . Alcohol Use: Yes     Comment: occasional   OB History    No data available     Review of Systems  Constitutional: Negative for fever.  HENT: Positive for congestion. Negative for ear pain, hearing loss, postnasal drip, sinus pressure and sneezing.   Eyes: Positive for photophobia. Negative for pain.  Respiratory: Negative for cough.   Cardiovascular: Negative for near-syncope.  Gastrointestinal: Positive for nausea.  Musculoskeletal: Negative for neck pain.  Skin: Negative for rash.  Neurological: Positive for headaches. Negative for dizziness, speech difficulty, weakness, numbness and loss of balance.      Allergies  Clonidine hydrochloride; Ibuprofen; and Lovastatin  Home Medications   Prior to Admission medications   Medication Sig Start Date End Date Taking? Authorizing Provider  acetaminophen (TYLENOL) 500 MG tablet Take 500 mg by mouth every 6 (six) hours as needed for headache.   Yes Historical Provider, MD  amLODipine (NORVASC) 5 MG tablet TAKE 1 TABLET ONCE DAILY. 08/28/14  Yes Olga Millers, MD  aspirin EC 81 MG tablet Take 81 mg by mouth daily.   Yes Historical Provider, MD  DULoxetine (CYMBALTA) 60 MG capsule Take 1 capsule (60 mg total) by mouth daily. 03/11/14  Yes Olga Millers, MD  estradiol (ESTRACE) 1 MG tablet Take 1 mg by mouth daily.   Yes Historical Provider, MD  levocetirizine (XYZAL) 5 MG tablet TAKE 1 TABLET ONCE DAILY IN THE EVENING. 08/28/14  Yes Olga Millers, MD  loratadine (CLARITIN) 10 MG tablet Take 10 mg by mouth daily.   Yes Historical Provider, MD  LYRICA 75 MG capsule TAKE (1) CAPSULE TWICE DAILY. 09/23/14  Yes Olga Millers, MD  Multiple Vitamin (MULTIVITAMIN WITH MINERALS) TABS tablet Take 1 tablet by mouth daily.   Yes Historical Provider, MD  NEXIUM 40 MG capsule TAKE 1 CAPSULE ONCE DAILY AT 12 NOON. 08/21/14  Yes  Olga Millers, MD  nitroGLYCERIN (NITROSTAT) 0.4 MG SL tablet Place 1 tablet (0.4 mg total) under the tongue every 5 (five) minutes as needed for chest pain. 01/22/14  Yes Olga Millers, MD  pravastatin (PRAVACHOL) 40 MG tablet TAKE ONE TABLET AT BEDTIME. 09/23/14  Yes Olga Millers, MD   BP 142/80 mmHg  Pulse 79  Temp(Src) 97.8 F (36.6 C) (Oral)  Resp 20  Ht 5\' 4"  (1.626 m)  Wt 165 lb 6.4 oz (75.025 kg)  BMI 28.38 kg/m2  SpO2 94%  LMP 06/07/2012 Physical Exam  Constitutional: She is oriented to person, place, and time. She appears well-developed and well-nourished.  HENT:  Head: Normocephalic.  Right Ear: External ear normal.  Left Ear: External ear normal.  Mouth/Throat: Oropharynx is clear and moist.  No oropharyngeal exudate.  Eyes: Pupils are equal, round, and reactive to light.  Neck: Normal range of motion.  Cardiovascular: Normal rate and regular rhythm.   Pulmonary/Chest: Effort normal and breath sounds normal.  Musculoskeletal: Normal range of motion.  Lymphadenopathy:    She has no cervical adenopathy.  Neurological: She is alert and oriented to person, place, and time. No cranial nerve deficit.  Skin: Skin is warm. No rash noted.  Nursing note and vitals reviewed.   ED Course  Procedures (including critical care time) Labs Review Labs Reviewed - No data to display  Imaging Review Ct Head Wo Contrast  10/16/2014   CLINICAL DATA:  Acute onset of generalized headache. High blood pressure. Initial encounter.  EXAM: CT HEAD WITHOUT CONTRAST  TECHNIQUE: Contiguous axial images were obtained from the base of the skull through the vertex without intravenous contrast.  COMPARISON:  CT of the head performed 06/08/2012, and MRI of the brain performed 05/03/2014  FINDINGS: There is no evidence of acute infarction, mass lesion, or intra- or extra-axial hemorrhage on CT.  The posterior fossa, including the cerebellum, brainstem and fourth ventricle, is within  normal limits. The third and lateral ventricles, and basal ganglia are unremarkable in appearance. The cerebral hemispheres are symmetric in appearance, with normal gray-white differentiation. No mass effect or midline shift is seen.  There is no evidence of fracture; visualized osseous structures are unremarkable in appearance. The orbits are within normal limits. The paranasal sinuses and mastoid air cells are well-aerated. No significant soft tissue abnormalities are seen.  IMPRESSION: Unremarkable noncontrast CT of the head.   Electronically Signed   By: Garald Balding M.D.   On: 10/16/2014 04:52     EKG Interpretation None    after IV Fluids, IV Benadryl, Toradol and Zofran pateint HA resolved and BP within normal range  Instructed to take meds on regular basis and FU with PCP as needed   MDM   Final diagnoses:  Headache  Essential hypertension        Junius Creamer, NP 10/16/14 [id number]  Linton Flemings, MD 10/16/14 [phone]

## 2014-10-25 ENCOUNTER — Other Ambulatory Visit (INDEPENDENT_AMBULATORY_CARE_PROVIDER_SITE_OTHER): Payer: PPO

## 2014-10-25 ENCOUNTER — Encounter: Payer: Self-pay | Admitting: Internal Medicine

## 2014-10-25 ENCOUNTER — Ambulatory Visit (INDEPENDENT_AMBULATORY_CARE_PROVIDER_SITE_OTHER): Payer: PPO | Admitting: Internal Medicine

## 2014-10-25 VITALS — BP 168/100 | HR 101 | Temp 98.4°F | Resp 16 | Ht 64.0 in | Wt 164.8 lb

## 2014-10-25 DIAGNOSIS — I10 Essential (primary) hypertension: Secondary | ICD-10-CM

## 2014-10-25 LAB — COMPREHENSIVE METABOLIC PANEL
ALT: 15 U/L (ref 0–35)
AST: 15 U/L (ref 0–37)
Albumin: 4.6 g/dL (ref 3.5–5.2)
Alkaline Phosphatase: 88 U/L (ref 39–117)
BUN: 19 mg/dL (ref 6–23)
CALCIUM: 10.1 mg/dL (ref 8.4–10.5)
CO2: 30 meq/L (ref 19–32)
CREATININE: 0.96 mg/dL (ref 0.40–1.20)
Chloride: 102 mEq/L (ref 96–112)
GFR: 78.33 mL/min (ref >60.00)
Glucose, Bld: 78 mg/dL (ref 70–99)
Potassium: 3.6 mEq/L (ref 3.5–5.1)
SODIUM: 140 meq/L (ref 135–145)
Total Bilirubin: 0.4 mg/dL (ref 0.2–1.2)
Total Protein: 7.9 g/dL (ref 6.0–8.3)

## 2014-10-25 MED ORDER — LISINOPRIL 40 MG PO TABS: 40.0000 mg | ORAL_TABLET | Freq: Every day | ORAL | Status: DC

## 2014-10-25 MED ORDER — ESOMEPRAZOLE MAGNESIUM 40 MG PO CPDR: DELAYED_RELEASE_CAPSULE | ORAL | Status: DC

## 2014-10-25 NOTE — Progress Notes (Signed)
Pre visit review using our clinic review tool, if applicable. No additional management support is needed unless otherwise documented below in the visit note. 

## 2014-10-25 NOTE — Assessment & Plan Note (Signed)
Moderately exacerbated. Likely due to the weight gain and recent stress. Will add lisinopril 40 mg daily and check CMP today. Headaches only sign of end organ damage. Continue amlodipine 5 mg daily as well. See her back in 2 weeks for close follow up. Advised if she has chest pains to return to the ER.

## 2014-10-25 NOTE — Patient Instructions (Signed)
We have sent in a new blood pressure medicine called lisinopril that you can take. Take 1 pill daily and keep taking the amlodipine as well.   We want to see you back in 2 weeks to check on the pressure.  Hypertension Hypertension, commonly called high blood pressure, is when the force of blood pumping through your arteries is too strong. Your arteries are the blood vessels that carry blood from your heart throughout your body. A blood pressure reading consists of a higher number over a lower number, such as 110/72. The higher number (systolic) is the pressure inside your arteries when your heart pumps. The lower number (diastolic) is the pressure inside your arteries when your heart relaxes. Ideally you want your blood pressure below 120/80. Hypertension forces your heart to work harder to pump blood. Your arteries may become narrow or stiff. Having hypertension puts you at risk for heart disease, stroke, and other problems.  RISK FACTORS Some risk factors for high blood pressure are controllable. Others are not.  Risk factors you cannot control include:   Race. You may be at higher risk if you are African American.  Age. Risk increases with age.  Gender. Men are at higher risk than women before age 53 years. After age 57, women are at higher risk than men. Risk factors you can control include:  Not getting enough exercise or physical activity.  Being overweight.  Getting too much fat, sugar, calories, or salt in your diet.  Drinking too much alcohol. SIGNS AND SYMPTOMS Hypertension does not usually cause signs or symptoms. Extremely high blood pressure (hypertensive crisis) may cause headache, anxiety, shortness of breath, and nosebleed. DIAGNOSIS  To check if you have hypertension, your health care provider will measure your blood pressure while you are seated, with your arm held at the level of your heart. It should be measured at least twice using the same arm. Certain conditions can  cause a difference in blood pressure between your right and left arms. A blood pressure reading that is higher than normal on one occasion does not mean that you need treatment. If one blood pressure reading is high, ask your health care provider about having it checked again. TREATMENT  Treating high blood pressure includes making lifestyle changes and possibly taking medicine. Living a healthy lifestyle can help lower high blood pressure. You may need to change some of your habits. Lifestyle changes may include:  Following the DASH diet. This diet is high in fruits, vegetables, and whole grains. It is low in salt, red meat, and added sugars.  Getting at least 2 hours of brisk physical activity every week.  Losing weight if necessary.  Not smoking.  Limiting alcoholic beverages.  Learning ways to reduce stress. If lifestyle changes are not enough to get your blood pressure under control, your health care provider may prescribe medicine. You may need to take more than one. Work closely with your health care provider to understand the risks and benefits. HOME CARE INSTRUCTIONS  Have your blood pressure rechecked as directed by your health care provider.   Take medicines only as directed by your health care provider. Follow the directions carefully. Blood pressure medicines must be taken as prescribed. The medicine does not work as well when you skip doses. Skipping doses also puts you at risk for problems.   Do not smoke.   Monitor your blood pressure at home as directed by your health care provider. SEEK MEDICAL CARE IF:   You think  you are having a reaction to medicines taken.  You have recurrent headaches or feel dizzy.  You have swelling in your ankles.  You have trouble with your vision. SEEK IMMEDIATE MEDICAL CARE IF:  You develop a severe headache or confusion.  You have unusual weakness, numbness, or feel faint.  You have severe chest or abdominal pain.  You  vomit repeatedly.  You have trouble breathing. MAKE SURE YOU:   Understand these instructions.  Will watch your condition.  Will get help right away if you are not doing well or get worse. Document Released: 03/15/2005 Document Revised: 07/30/2013 Document Reviewed: 01/05/2013 [facility] Patient Information 2015 [place], [place]. This information is not intended to replace advice given to you by your health care provider. Make sure you discuss any questions you have with your health care provider.

## 2014-10-25 NOTE — Progress Notes (Signed)
   Subjective:    Patient ID: Hayley Miller, female    DOB: 16-Feb-1961, 54 y.o.   MRN: [id number]  HPI The patient is a 54 YO female coming in for ER follow up (hypertension and headaches, CT head negative for bleed, no adjustment to her BP regimen). She has been still having daily headaches since that visit 1 week ago. Started about 1 month ago. Also feeling tired, no energy. Does not have chest pains, stomach pains, nausea, confusion. Is taking her amlodipine. Weight up some since last visit. Is under a lot of stress in her personal life right now.   Review of Systems  Constitutional: Negative for fever, activity change, appetite change, fatigue and unexpected weight change.  HENT: Positive for tinnitus.   Eyes: Negative.   Respiratory: Negative for cough, chest tightness and shortness of breath.   Cardiovascular: Negative for chest pain, palpitations and leg swelling.  Gastrointestinal: Negative for abdominal pain, diarrhea, constipation and abdominal distention.  Musculoskeletal: Positive for myalgias. Negative for arthralgias and gait problem.  Skin: Negative.   Neurological: Positive for headaches. Negative for dizziness, tremors, seizures, syncope, weakness and light-headedness.      Objective:   Physical Exam  Constitutional: She is oriented to person, place, and time. She appears well-developed and well-nourished. No distress.  HENT:  Head: Normocephalic and atraumatic.  Eyes: EOM are normal.  Neck: Normal range of motion.  Cardiovascular: Normal rate and regular rhythm.   No murmur heard. Pulmonary/Chest: Effort normal and breath sounds normal.  Abdominal: Soft. Bowel sounds are normal.  Neurological: She is alert and oriented to person, place, and time. Coordination normal.  Skin: Skin is warm and dry.   Filed Vitals:   10/25/14 1302  BP: 168/100  Pulse: 101  Temp: 98.4 F (36.9 C)  TempSrc: Oral  Resp: 16  Height: 5\' 4"  (1.626 m)  Weight: 164 lb 12.8 oz (74.753  kg)  SpO2: 98%      Assessment & Plan:

## 2014-10-28 ENCOUNTER — Other Ambulatory Visit: Payer: Self-pay | Admitting: Internal Medicine

## 2014-11-08 ENCOUNTER — Ambulatory Visit: Payer: PPO | Admitting: Internal Medicine

## 2014-11-28 ENCOUNTER — Encounter: Payer: Self-pay | Admitting: Internal Medicine

## 2014-11-28 ENCOUNTER — Ambulatory Visit (INDEPENDENT_AMBULATORY_CARE_PROVIDER_SITE_OTHER): Payer: PPO | Admitting: Internal Medicine

## 2014-11-28 VITALS — BP 168/102 | HR 92 | Temp 97.9°F | Ht 64.0 in | Wt 165.0 lb

## 2014-11-28 DIAGNOSIS — M545 Low back pain, unspecified: Secondary | ICD-10-CM

## 2014-11-28 DIAGNOSIS — I10 Essential (primary) hypertension: Secondary | ICD-10-CM

## 2014-11-28 MED ORDER — CYCLOBENZAPRINE HCL 10 MG PO TABS
10.0000 mg | ORAL_TABLET | Freq: Three times a day (TID) | ORAL | Status: DC | PRN
Start: 2014-11-28 — End: 2014-11-28

## 2014-11-28 MED ORDER — HYDROCODONE-ACETAMINOPHEN 7.5-500 MG PO TABS: 1.0000 | ORAL_TABLET | Freq: Four times a day (QID) | ORAL | Status: DC | PRN

## 2014-11-28 MED ORDER — KETOROLAC TROMETHAMINE 30 MG/ML IM SOLN
30.0000 mg | Freq: Once | INTRAMUSCULAR | Status: AC
  Administered 2014-11-28: 30 mg via INTRAMUSCULAR

## 2014-11-28 MED ORDER — CYCLOBENZAPRINE HCL 5 MG PO TABS: 5.0000 mg | ORAL_TABLET | Freq: Three times a day (TID) | ORAL | Status: DC | PRN
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
## 2014-11-28 NOTE — Addendum Note (Signed)
Addended by: Lyman Bishop on: 11/28/2014 05:07 PM   Modules accepted: Orders

## 2014-11-28 NOTE — Progress Notes (Signed)
Pre visit review using our clinic review tool, if applicable. No additional management support is needed unless otherwise documented below in the visit note. 
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
## 2014-11-28 NOTE — Patient Instructions (Signed)
You had the pain shot today (toradol)  Please take all new medication as prescribed - the pain medication, and muscle relaxer as needed  Please continue all other medications as before, and refills have been done if requested.  Please have the pharmacy call with any other refills you may need.  Please keep your appointments with your specialists as you may have planned
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
## 2014-11-29 ENCOUNTER — Other Ambulatory Visit: Payer: Self-pay | Admitting: Internal Medicine

## 2014-12-29 ENCOUNTER — Other Ambulatory Visit: Payer: Self-pay | Admitting: Internal Medicine

## 2015-02-11 ENCOUNTER — Encounter: Payer: Self-pay | Admitting: Internal Medicine

## 2015-02-24 ENCOUNTER — Other Ambulatory Visit: Payer: Self-pay | Admitting: Internal Medicine

## 2015-04-01 ENCOUNTER — Other Ambulatory Visit: Payer: Self-pay | Admitting: Internal Medicine

## 2015-05-01 ENCOUNTER — Other Ambulatory Visit: Payer: Self-pay | Admitting: Internal Medicine

## 2015-05-30 ENCOUNTER — Other Ambulatory Visit: Payer: Self-pay | Admitting: Internal Medicine

## 2015-06-03 ENCOUNTER — Encounter: Payer: Self-pay | Admitting: Adult Health

## 2015-06-03 ENCOUNTER — Telehealth: Payer: Self-pay | Admitting: Internal Medicine

## 2015-06-03 ENCOUNTER — Ambulatory Visit (INDEPENDENT_AMBULATORY_CARE_PROVIDER_SITE_OTHER): Payer: PPO | Admitting: Adult Health

## 2015-06-03 VITALS — BP 160/98 | HR 120 | Temp 98.4°F | Ht 64.0 in | Wt 171.0 lb

## 2015-06-03 DIAGNOSIS — I10 Essential (primary) hypertension: Secondary | ICD-10-CM | POA: Diagnosis not present

## 2015-06-03 DIAGNOSIS — R000 Tachycardia, unspecified: Secondary | ICD-10-CM

## 2015-06-03 DIAGNOSIS — J0140 Acute pansinusitis, unspecified: Secondary | ICD-10-CM

## 2015-06-03 MED ORDER — DOXYCYCLINE HYCLATE 100 MG PO CAPS: 100.0000 mg | ORAL_CAPSULE | Freq: Two times a day (BID) | ORAL | Status: DC

## 2015-06-03 NOTE — Progress Notes (Addendum)
Subjective:    Patient ID: Hayley Miller, female    DOB: 1961/03/02, 55 y.o.   MRN: [id number]  HPI  55 year old female who presents to the office today for less than 24 hours of non productive cough, rhinorrhea, frontal and maxillary sinus pain and pressure and generalized weakness.   She reports that at 5 am this morning she took 4 tabs of Alka Seltzer plus cold medication, 2 tabs of sudafed , two packets of BC powder and one zyrtec.   In the office she is lethargic, warm, and falls asleep easily. She is able to hold a conversation but states " I can't remember, I am so sleepy. I have been up all night with a runny nose. "  Her BP is up in the office today to 180/120. - She endorses taking her medications at home.   Review of Systems  Constitutional: Positive for activity change, appetite change and fatigue. Negative for chills and diaphoresis.  HENT: Positive for congestion, rhinorrhea, sinus pressure and trouble swallowing. Negative for nosebleeds and postnasal drip.   Respiratory: Negative.   Cardiovascular: Negative.   Skin: Negative.   Neurological: Positive for weakness and headaches.  Psychiatric/Behavioral: Positive for confusion and sleep disturbance.  All other systems reviewed and are negative.  Past Medical History  Diagnosis Date  . HTN (hypertension)   . HLD (hyperlipidemia)   . Chronic pain syndrome   . Hemorrhage     upper gastrointestional. GI bleed 10/09 due to NSAID  . Depression   . GERD (gastroesophageal reflux disease)   . Anxiety   . Fibromyalgia   . Rhinitis   . Myocardial infarction ([place])     x2  . Allergy   . Anemia   . Erosive esophagitis   . Cameron lesion, acute   . Hiatal hernia   . CAD (coronary artery disease)     a. s/p AL MI in 2006 tx with PTCA to LAD;  b. LHC (10/2009): lum irregs in LAD, o/w no CAD, EF 55-60%;  c. Myoview (07/2011):  no ischemia, EF 63%;  d. Echo (11/2011):  mild LVH, EF 60-65%, Gr 1 DD;  e.  Lexiscan Myoview (04/2013):   Inferior bowel atten artifact with inferoapical defect; no ischemia; EF 58% (low risk)    Social History   Social History  . Marital Status: Single    Spouse Name: N/A  . Number of Children: 1  . Years of Education: N/A   Occupational History  . unemployed    Social History Main Topics  . Smoking status: Current Every [place]rone Smoker  . Smokeless tobacco: Never Used     Comment: Divorced, lives with female domestic partner  . Alcohol Use: Yes     Comment: occasional  . Drug Use: No  . Sexual Activity: Yes    Birth Control/ Protection: Other-see comments     Comment: has a female domestic partner   Other Topics Concern  . Not on file   Social History Narrative   Lives alone - divorced; 1 child.    Used to live with female domestic partner   Unemployed - disability.           Past Surgical History  Procedure Laterality Date  . L breast - cyst removed  2002  . Cardiac catheterization    . Upper gastrointestinal endoscopy    . Laparoscopic assisted vaginal hysterectomy N/A 05/08/2013    Procedure: LAPAROSCOPIC ASSISTED VAGINAL HYSTERECTOMY;  Surgeon: Olga Millers,  MD;  Location: [facility];  Service: Gynecology;  Laterality: N/A;  . Laparoscopic bilateral salpingo oopherectomy N/A 05/08/2013    Procedure: LAPAROSCOPIC BILATERAL SALPINGO OOPHORECTOMY;  Surgeon: Olga Millers, MD;  Location: [facility];  Service: Gynecology;  Laterality: N/A;  . Cystoscopy N/A 05/08/2013    Procedure: CYSTOSCOPY;  Surgeon: Olga Millers, MD;  Location: [facility];  Service: Gynecology;  Laterality: N/A;  . Bladder suspension N/A 05/08/2013    Procedure: TRANSVAGINAL TAPE (TVT) PROCEDURE;  Surgeon: Olga Millers, MD;  Location: [facility];  Service: Gynecology;  Laterality: N/A;    Family History  Problem Relation Age of Onset  . Hypertension Mother   . Heart murmur Mother   . Colon polyps Mother   . Colon cancer Maternal Aunt   . Heart disease Son   . Heart disease Maternal Grandmother   .  Diabetes Maternal Aunt   . Esophageal cancer Neg Hx   . Rectal cancer Neg Hx   . Stomach cancer Neg Hx     Allergies  Allergen Reactions  . Clonidine Hydrochloride Other (See Comments)    REACTION: FATIGUE  . Ibuprofen Other (See Comments)    REACTION: GI bleed  . Lovastatin Other (See Comments)    REACTION: pains    Current Outpatient Prescriptions on File Prior to Visit  Medication Sig Dispense Refill  . acetaminophen (TYLENOL) 500 MG tablet Take 500 mg by mouth every 6 (six) hours as needed for headache.    [name] amLODipine (NORVASC) 5 MG tablet TAKE 1 TABLET ONCE DAILY. 30 tablet 0  . aspirin EC 81 MG tablet Take 81 mg by mouth daily.    . DULoxetine (CYMBALTA) 60 MG capsule TAKE (1) CAPSULE DAILY. 30 capsule 11  . estradiol (ESTRACE) 1 MG tablet Take 1 mg by mouth daily.    [name] HYDROcodone-acetaminophen (LORTAB) 7.5-500 MG per tablet Take 1 tablet by mouth every 6 (six) hours as needed for pain. 50 tablet 0  . levocetirizine (XYZAL) 5 MG tablet TAKE 1 TABLET ONCE DAILY IN THE EVENING. 30 tablet 0  . lisinopril (PRINIVIL,ZESTRIL) 40 MG tablet Take 1 tablet (40 mg total) by mouth daily. 90 tablet 3  . loratadine (CLARITIN) 10 MG tablet Take 10 mg by mouth daily.    [name] LYRICA 75 MG capsule TAKE (1) CAPSULE TWICE DAILY. 60 capsule 2  . Multiple Vitamin (MULTIVITAMIN WITH MINERALS) TABS tablet Take 1 tablet by mouth daily.    [name] NEXIUM 40 MG capsule TAKE 1 CAPSULE AT 12:OO [place]. 30 capsule 0  . NITROSTAT 0.4 MG SL tablet DISSOLVE ONE TABLET UNDER TONGUE AS NEEDED FOR CHEST PAIN. MAY REPEATIN 5 MIN IF NEEDED-MAX OF 3. 25 tablet 0  . pravastatin (PRAVACHOL) 40 MG tablet TAKE ONE TABLET AT BEDTIME. 30 tablet 11   No current facility-administered medications on file prior to visit.    BP 180/120 mmHg  Pulse 120  Temp(Src) 98.4 F (36.9 C) (Oral)  Ht 5\' 4"  (1.626 m)  Wt 171 lb (77.565 kg)  BMI 29.34 kg/m2  SpO2 96%  LMP 06/07/2012       Objective:   Physical Exam    Constitutional: She is oriented to person, place, and time. She appears well-developed and well-nourished.  HENT:  Nose: Rhinorrhea present. Right sinus exhibits maxillary sinus tenderness and frontal sinus tenderness. Left sinus exhibits maxillary sinus tenderness and frontal sinus tenderness.  Cardiovascular: Regular rhythm, normal heart sounds and intact distal pulses.  Tachycardia present.  Pulmonary/Chest: Effort normal and breath sounds normal. No respiratory distress. She has no wheezes. She has no rales. She exhibits no tenderness.  Musculoskeletal: Normal range of motion. She exhibits no edema or tenderness.  Neurological: She is alert and oriented to person, place, and time.  Skin: Skin is warm and dry. No rash noted. She is not diaphoretic. No erythema. No pallor.  Psychiatric: She has a normal mood and affect. Her behavior is normal. Judgment and thought content normal.  Nursing note and vitals reviewed.     Assessment & Plan:  1. Acute pansinusitis, recurrence not specified - doxycycline (VIBRAMYCIN) 100 MG capsule; Take 1 capsule (100 mg total) by mouth 2 (two) times daily.  Dispense: 20 capsule; Refill: 0  2. Tachycardia - Likely due to the amount of sudafed she took.  - EKG 12-Lead- Sinus  Rhythm  - frequent ectopic ventricular beats, rate 98  3. Essential hypertension - Again, partially due to amount of OTC cold medications she took.  - Gave two Nitro tabs in the office today. Her BP came down to 160/98. She was advised to taken an additional dose of Norvasc when she gets home.  - Needs to follow up with Dr. Sharlet Salina this week.

## 2015-06-03 NOTE — Telephone Encounter (Signed)
PLEASE NOTE: All timestamps contained within this report are represented as [place] Standard Time. CONFIDENTIALTY NOTICE: This fax transmission is intended only for the addressee. It contains information that is legally privileged, confidential or otherwise protected from use or disclosure. If you are not the intended recipient, you are strictly prohibited from reviewing, disclosing, copying using or disseminating any of this information or taking any action in reliance on or regarding this information. If you have received this fax in error, please notify us immediately by telephone so that we can arrange for its return to [place]. Phone: [phone], Toll-Free: [phone], Fax: [phone] Page: 1 of 1 Call Id: [id number] [name] DOB: 03-02-1961 Initial Comment Caller states she has been vomiting. Nurse Assessment Nurse: Marcelline Deist, RN, Lynda Date/Time (Eastern Time): 06/03/2015 9:02:12 AM Confirm and document reason for call. If symptomatic, describe symptoms. You must click the next button to save text entered. ---Caller states she has been vomiting. Also has a headache. Thinks it was something she ate Sat. Has a cold. Some diarrhea, too. Has the patient traveled out of the country within the last 30 days? ---Not Applicable Does the patient have any new or worsening symptoms? ---Yes Will a triage be completed? ---Yes Related visit to physician within the last 2 weeks? ---No Does the PT have any chronic conditions? (i.e. diabetes, asthma, etc.) ---Yes List chronic conditions. ---had 2 heart attacks Is the patient pregnant or possibly pregnant? (Ask all females between the ages of 48-55) ---No Is this a behavioral health or substance abuse call? ---No Guidelines Guideline Title Affirmed Question Affirmed Notes Sinus Pain or Congestion [1] Redness or swelling on the  cheek, forehead or around the eye AND [2] no fever Final Disposition User See Physician within 4 Hours (or PCP triage) Marcelline Deist, RN, Kermit Balo Comments No availability at [facility] office today, so patient scheduled with NP at [facility] office. patient has bad headache, her eyes are bloodshot, she is stuffy and has vomited a couple times this am. Not sure if fever, but has had sweating and chills. Referrals REFERRED TO PCP OFFICE Disagree/Comply: Comply

## 2015-06-03 NOTE — Patient Instructions (Signed)
I am sorry you are feeling so badly  Your blood pressure more elevated today due to all the over the counter  medication you took. Please do not take any more of that.   I have sent in a prescription for doxycyline, take this twice a [place] for 10 days.   You can also use Flonase.   Follow up with Dr. Sharlet Salina this week.

## 2015-06-03 NOTE — Progress Notes (Signed)
Pre visit review using our clinic review tool, if applicable. No additional management support is needed unless otherwise documented below in the visit note. 

## 2015-06-03 NOTE — Telephone Encounter (Signed)
Appt was made for today @ 10:15.Marland KitchenJohny Miller

## 2015-06-27 ENCOUNTER — Other Ambulatory Visit: Payer: Self-pay | Admitting: Internal Medicine

## 2015-06-30 ENCOUNTER — Other Ambulatory Visit: Payer: Self-pay | Admitting: Internal Medicine

## 2015-07-02 ENCOUNTER — Other Ambulatory Visit: Payer: Self-pay | Admitting: Geriatric Medicine

## 2015-07-02 MED ORDER — AMLODIPINE BESYLATE 5 MG PO TABS: 5.0000 mg | ORAL_TABLET | Freq: Every day | ORAL | Status: DC

## 2015-07-02 MED ORDER — LEVOCETIRIZINE DIHYDROCHLORIDE 5 MG PO TABS: ORAL_TABLET | ORAL | Status: DC

## 2015-07-02 MED ORDER — ESOMEPRAZOLE MAGNESIUM 40 MG PO CPDR: DELAYED_RELEASE_CAPSULE | ORAL | Status: DC

## 2015-07-02 NOTE — Telephone Encounter (Signed)
Last OV was 02/11/2015 and last refill was 11/29/14 #60 with 2 refills. Please advise in Dr. Nathanial Millman absence, thanks.

## 2015-07-02 NOTE — Telephone Encounter (Signed)
Faxed to pharmacy

## 2015-08-28 ENCOUNTER — Other Ambulatory Visit: Payer: Self-pay | Admitting: Internal Medicine

## 2015-09-15 ENCOUNTER — Other Ambulatory Visit: Payer: Self-pay | Admitting: Obstetrics and Gynecology

## 2015-09-15 DIAGNOSIS — N63 Unspecified lump in unspecified breast: Secondary | ICD-10-CM

## 2015-09-26 ENCOUNTER — Other Ambulatory Visit: Payer: Self-pay | Admitting: Internal Medicine

## 2015-10-01 ENCOUNTER — Telehealth: Payer: Self-pay

## 2015-10-01 DIAGNOSIS — R159 Full incontinence of feces: Secondary | ICD-10-CM | POA: Diagnosis not present

## 2015-10-01 DIAGNOSIS — K5900 Constipation, unspecified: Secondary | ICD-10-CM | POA: Diagnosis not present

## 2015-10-01 DIAGNOSIS — N816 Rectocele: Secondary | ICD-10-CM | POA: Diagnosis not present

## 2015-10-01 NOTE — Telephone Encounter (Signed)
Hayley Miller with [facility]  192/134 178/112  Pt has stated that pt has taken all medications today as she usually does. Pt has been using Vicks OTC inhaler for about a month and TID. Active ingredient is levmetamfetamine (about 50 mg).  Pt scheduled to see PCP tomorrow.

## 2015-10-02 ENCOUNTER — Encounter: Payer: Self-pay | Admitting: Internal Medicine

## 2015-10-02 ENCOUNTER — Ambulatory Visit
Admission: RE | Admit: 2015-10-02 | Discharge: 2015-10-02 | Disposition: A | Discharge status: Home / Self Care | Payer: PPO | Source: Ambulatory Visit | Attending: Obstetrics and Gynecology | Admitting: Obstetrics and Gynecology

## 2015-10-02 ENCOUNTER — Ambulatory Visit (INDEPENDENT_AMBULATORY_CARE_PROVIDER_SITE_OTHER): Payer: PPO | Admitting: Internal Medicine

## 2015-10-02 ENCOUNTER — Other Ambulatory Visit (INDEPENDENT_AMBULATORY_CARE_PROVIDER_SITE_OTHER): Payer: PPO

## 2015-10-02 VITALS — BP 160/110 | HR 100 | Temp 98.1°F | Resp 16 | Ht 64.0 in | Wt 170.4 lb

## 2015-10-02 DIAGNOSIS — I10 Essential (primary) hypertension: Secondary | ICD-10-CM

## 2015-10-02 DIAGNOSIS — J0110 Acute frontal sinusitis, unspecified: Secondary | ICD-10-CM | POA: Diagnosis not present

## 2015-10-02 DIAGNOSIS — N63 Unspecified lump in unspecified breast: Secondary | ICD-10-CM

## 2015-10-02 DIAGNOSIS — J321 Chronic frontal sinusitis: Secondary | ICD-10-CM

## 2015-10-02 DIAGNOSIS — M797 Fibromyalgia: Secondary | ICD-10-CM

## 2015-10-02 LAB — CBC
HCT: 43.4 % (ref 36.0–46.0)
HEMOGLOBIN: 14.5 g/dL (ref 12.0–15.0)
MCHC: 33.4 g/dL (ref 30.0–36.0)
MCV: 92.2 fl (ref 78.0–100.0)
PLATELETS: 406 10*3/uL — AB (ref 150.0–400.0)
RBC: 4.71 Mil/uL (ref 3.87–5.11)
RDW: 13.7 % (ref 11.5–15.5)
WBC: 9.5 10*3/uL (ref 4.0–10.5)

## 2015-10-02 LAB — LIPID PANEL
Cholesterol: 179 mg/dL (ref 0–200)
HDL: 40.7 mg/dL (ref >39.00)
NONHDL: 138.71
TRIGLYCERIDES: 206 mg/dL — AB (ref 0.0–149.0)
Total CHOL/HDL Ratio: 4
VLDL: 41.2 mg/dL — ABNORMAL HIGH (ref 0.0–40.0)

## 2015-10-02 LAB — COMPREHENSIVE METABOLIC PANEL
ALBUMIN: 4.2 g/dL (ref 3.5–5.2)
ALK PHOS: 74 U/L (ref 39–117)
ALT: 10 U/L (ref 0–35)
AST: 14 U/L (ref 0–37)
BUN: 13 mg/dL (ref 6–23)
CO2: 28 mEq/L (ref 19–32)
Calcium: 9.7 mg/dL (ref 8.4–10.5)
Chloride: 106 mEq/L (ref 96–112)
Creatinine, Ser: 0.96 mg/dL (ref 0.40–1.20)
GFR: 78.05 mL/min (ref >60.00)
Glucose, Bld: 82 mg/dL (ref 70–99)
POTASSIUM: 3.7 meq/L (ref 3.5–5.1)
Sodium: 140 mEq/L (ref 135–145)
TOTAL PROTEIN: 7.4 g/dL (ref 6.0–8.3)
Total Bilirubin: 0.3 mg/dL (ref 0.2–1.2)

## 2015-10-02 LAB — TROPONIN I: TNIDX: 0.01 ug/l (ref 0.00–0.06)

## 2015-10-02 LAB — HEMOGLOBIN A1C: HEMOGLOBIN A1C: 5.3 % (ref 4.6–6.5)

## 2015-10-02 LAB — LDL CHOLESTEROL, DIRECT: LDL DIRECT: 105 mg/dL

## 2015-10-02 MED ORDER — AMLODIPINE BESYLATE 10 MG PO TABS: 10.0000 mg | ORAL_TABLET | Freq: Every day | ORAL | Status: DC

## 2015-10-02 MED ORDER — METHYLPREDNISOLONE ACETATE 40 MG/ML IJ SUSP
40.0000 mg | Freq: Once | INTRAMUSCULAR | Status: AC
  Administered 2015-10-02: 40 mg via INTRAMUSCULAR

## 2015-10-02 MED ORDER — GABAPENTIN 300 MG PO CAPS: 300.0000 mg | ORAL_CAPSULE | Freq: Three times a day (TID) | ORAL | Status: DC

## 2015-10-02 NOTE — Progress Notes (Signed)
Pre visit review using our clinic review tool, if applicable. No additional management support is needed unless otherwise documented below in the visit note. 

## 2015-10-02 NOTE — Patient Instructions (Addendum)
We will check the blood work today and call you back with the results.   We have increased the amlodipine to 10 mg daily. Take 2 pills of what you have left and when you fill the new prescription it will be stronger so take 1 pill daily.   Continue taking the lisinopril the same.   We need to see you back in 2-3 weeks in the office. The high blood pressure is serious and we need to follow up on it.   You need to stop taking the bc powder or goody powder or ibuprofen or mortin or naproxen as these medicines can increase your blood pressure.   If you have any chest pains again you need to go immediately to the ER.   We have given you a shot of steroids for the sinuses today.   We have sent in pain medicine called gabapentin. For the first 3 days you can take 1 pill daily. Then you can increase to 1 pill twice a Se[place] as needed. Then after another 3 days you can take 1 pill up to 3 times per [place].

## 2015-10-03 ENCOUNTER — Encounter: Payer: Self-pay | Admitting: Internal Medicine

## 2015-10-03 DIAGNOSIS — J019 Acute sinusitis, unspecified: Secondary | ICD-10-CM | POA: Insufficient documentation

## 2015-10-03 NOTE — Assessment & Plan Note (Signed)
She is not on adequate dosing of lyrica and will stop and use gabapentin with quick taper to 300 mg TID over the next 1-2 weeks. She is also taking cymbalta 60 mg daily.

## 2015-10-03 NOTE — Assessment & Plan Note (Signed)
Increase amlodipine to 10 mg daily and continue lisinopril 40 mg daily. Asked her to stop all bc powder and all sinus medication. Needs visit in 2-3 weeks for follow up. Advised if any more chest pains needs to go immediately to ER. She agrees with plan.

## 2015-10-03 NOTE — Assessment & Plan Note (Signed)
Depo-medrol 40 mg IM given at visit. No antibiotics today. Have asked her to stop all cold medicine as they are elevating her blood pressure. She is okay to use xyzal and flonase only.

## 2015-10-03 NOTE — Progress Notes (Signed)
   Subjective:    Patient ID: Hayley Miller, female    DOB: [birth year], 55 y.o.   MRN: [id number]  HPI The patient is a 55 YO female coming in for multiple complaints. She has not been back for some time. She is having high blood pressure at home and at her gyn. She is having headaches and not feeling well with blood pressure. She had a chest pain several weeks ago that she did not seek care for. She declines any work up for that today. She is taking her amlodipine 5 mg and lisinopril 40 mg daily. No side effects from medicine. She denies chest pains now and no SOB. She is taking OTC cold medicine and is taking at least TID right now. She is using cold medicine and vicks inhaler. She is still smoking about the same amount.  Next concern is her fibromyalgia pain. She is taking her medications as she should and is in flare. She is wanting something for pain. She is taking a lot of bc powders at home.  Next concern is her sinuses. They have been blocked for several weeks. Severe pain. Coughing some as well. Pain in her ears. Yellow and green nasal drainage.  Other concerns are present as well but we are not able to adequately address them today.   Review of Systems  Constitutional: Negative for fever, activity change, appetite change, fatigue and unexpected weight change.  HENT: Positive for congestion, ear discharge, ear pain, postnasal drip, rhinorrhea, sinus pressure and tinnitus.   Eyes: Negative.   Respiratory: Positive for cough. Negative for chest tightness and shortness of breath.   Cardiovascular: Negative for chest pain, palpitations and leg swelling.  Gastrointestinal: Negative for abdominal pain, diarrhea, constipation and abdominal distention.  Musculoskeletal: Positive for myalgias and arthralgias. Negative for gait problem.  Skin: Negative.   Neurological: Positive for headaches. Negative for dizziness, tremors, seizures, syncope, weakness and light-headedness.      Objective:   Physical Exam  Constitutional: She is oriented to person, place, and time. She appears well-developed and well-nourished. No distress.  HENT:  Head: Normocephalic and atraumatic.  Bilateral TM normal, frontal sinus tenderness, yellow crusting in the nose, oropharynx with redness  Eyes: EOM are normal.  Neck: Normal range of motion. No JVD present.  Shotty LAD in the neck  Cardiovascular: Normal rate and regular rhythm.   No murmur heard. Pulmonary/Chest: Effort normal and breath sounds normal. No respiratory distress. She has no wheezes.  Abdominal: Soft. Bowel sounds are normal. She exhibits no distension. There is no tenderness.  Musculoskeletal: She exhibits no edema.  Neurological: She is alert and oriented to person, place, and time. Coordination normal.  Skin: Skin is warm and dry.  Psychiatric:  Some scattered thoughts and hard to redirect   Filed Vitals:   10/02/15 1551 10/02/15 1625  BP: 158/100 160/110  Pulse: 100   Temp: 98.1 F (36.7 C)   TempSrc: Oral   Resp: 16   Height: 5\' 4"  (1.626 m)   Weight: 170 lb 6.4 oz (77.293 kg)   SpO2: 97%       Assessment & Plan:  Depo-medrol 40 mg IM given at visit.

## 2015-10-20 ENCOUNTER — Ambulatory Visit: Payer: PPO | Admitting: Internal Medicine

## 2015-10-28 ENCOUNTER — Other Ambulatory Visit: Payer: Self-pay | Admitting: Internal Medicine

## 2015-11-07 ENCOUNTER — Ambulatory Visit: Payer: PPO | Admitting: Internal Medicine

## 2015-11-28 ENCOUNTER — Other Ambulatory Visit: Payer: Self-pay | Admitting: Internal Medicine

## 2015-12-30 ENCOUNTER — Other Ambulatory Visit: Payer: Self-pay | Admitting: Internal Medicine

## 2016-01-29 ENCOUNTER — Other Ambulatory Visit: Payer: Self-pay | Admitting: Internal Medicine

## 2016-03-02 ENCOUNTER — Other Ambulatory Visit: Payer: Self-pay | Admitting: Internal Medicine

## 2016-03-31 ENCOUNTER — Other Ambulatory Visit: Payer: Self-pay | Admitting: Internal Medicine

## 2016-04-28 ENCOUNTER — Other Ambulatory Visit: Payer: Self-pay | Admitting: Internal Medicine

## 2016-04-30 ENCOUNTER — Other Ambulatory Visit: Payer: Self-pay | Admitting: Internal Medicine

## 2016-05-30 ENCOUNTER — Other Ambulatory Visit: Payer: Self-pay | Admitting: Internal Medicine

## 2016-06-01 ENCOUNTER — Ambulatory Visit (INDEPENDENT_AMBULATORY_CARE_PROVIDER_SITE_OTHER): Payer: PPO | Admitting: Internal Medicine

## 2016-06-01 ENCOUNTER — Other Ambulatory Visit (INDEPENDENT_AMBULATORY_CARE_PROVIDER_SITE_OTHER): Payer: PPO

## 2016-06-01 ENCOUNTER — Encounter: Payer: Self-pay | Admitting: Internal Medicine

## 2016-06-01 VITALS — BP 148/106 | HR 112 | Temp 98.6°F | Ht 64.0 in | Wt 170.0 lb

## 2016-06-01 DIAGNOSIS — M545 Low back pain, unspecified: Secondary | ICD-10-CM

## 2016-06-01 DIAGNOSIS — F331 Major depressive disorder, recurrent, moderate: Secondary | ICD-10-CM | POA: Diagnosis not present

## 2016-06-01 DIAGNOSIS — G8929 Other chronic pain: Secondary | ICD-10-CM

## 2016-06-01 DIAGNOSIS — I208 Other forms of angina pectoris: Secondary | ICD-10-CM | POA: Diagnosis not present

## 2016-06-01 DIAGNOSIS — I10 Essential (primary) hypertension: Secondary | ICD-10-CM

## 2016-06-01 DIAGNOSIS — I25110 Atherosclerotic heart disease of native coronary artery with unstable angina pectoris: Secondary | ICD-10-CM

## 2016-06-01 LAB — COMPREHENSIVE METABOLIC PANEL
ALBUMIN: 4.2 g/dL (ref 3.5–5.2)
ALT: 11 U/L (ref 0–35)
AST: 14 U/L (ref 0–37)
Alkaline Phosphatase: 84 U/L (ref 39–117)
BUN: 13 mg/dL (ref 6–23)
CALCIUM: 10.1 mg/dL (ref 8.4–10.5)
CHLORIDE: 110 meq/L (ref 96–112)
CO2: 27 mEq/L (ref 19–32)
Creatinine, Ser: 0.99 mg/dL (ref 0.40–1.20)
GFR: 75.14 mL/min (ref >60.00)
Glucose, Bld: 86 mg/dL (ref 70–99)
POTASSIUM: 3.4 meq/L — AB (ref 3.5–5.1)
Sodium: 144 mEq/L (ref 135–145)
Total Bilirubin: 0.2 mg/dL (ref 0.2–1.2)
Total Protein: 7.6 g/dL (ref 6.0–8.3)

## 2016-06-01 MED ORDER — DESVENLAFAXINE SUCCINATE ER 50 MG PO TB24: 50.0000 mg | ORAL_TABLET | Freq: Every day | ORAL | 3 refills | Status: DC

## 2016-06-01 MED ORDER — LISINOPRIL-HYDROCHLOROTHIAZIDE 20-25 MG PO TABS: 1.0000 | ORAL_TABLET | Freq: Every day | ORAL | 0 refills | Status: DC
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
## 2016-06-01 NOTE — Patient Instructions (Addendum)
There are some mild changes in the EKG of the heart from before and some of those changes are likely from the uncontrolled blood pressure. We need you to go back to see the heart doctor and have placed the referral.   We are changing the blood pressure medicine to lisinopril/hctz which will still be 1 pill per [place] but will help the blood pressure more.   We have sent in pristiq which should help with pain and with the anxiety.

## 2016-06-01 NOTE — Progress Notes (Signed)
Pre visit review using our clinic review tool, if applicable. No additional management support is needed unless otherwise documented below in the visit note. 

## 2016-06-02 DIAGNOSIS — I208 Other forms of angina pectoris: Secondary | ICD-10-CM | POA: Insufficient documentation

## 2016-06-02 NOTE — Assessment & Plan Note (Signed)
Since she is off cymbalta and did not notice benefit will try pristiq as she would benefit from the possible pain relief as well as depression/anxiety relief. If no results can try an SSRI.

## 2016-06-02 NOTE — Assessment & Plan Note (Signed)
BP is high again today and she never returned for follow up previously. EKG with some changes from prior and discussed with her likely old changes. Referral to cardiology for evaluation given hx CAD. Will continue amlodipine and change lisinopril to lisinopril/hctz. Per pharmacy records she is filling meds so likely is taking meds but poor compliance with follow up limits our ability to titrate therapy. Need her back within weeks and discussed this with her today.

## 2016-06-02 NOTE — Assessment & Plan Note (Signed)
EKG with minor changes and severe uncontrolled hypertension. Placed cardiology referral given her hx CAD and new anginal episodes which are increasing in frequency. She is using nitro several times per month which is too often.

## 2016-06-02 NOTE — Assessment & Plan Note (Signed)
Referral to neurosurgery postponed due to acute cardiac issues and blood pressure control. She is aware that we will address this issue in the future when we can get her overall health more stable.

## 2016-06-02 NOTE — Assessment & Plan Note (Signed)
Referral to cardiology as she is having more anginal episodes and hx CAD. Uncontrolled hypertension and smoker.

## 2016-07-01 ENCOUNTER — Encounter: Payer: Self-pay | Admitting: Internal Medicine

## 2016-07-05 ENCOUNTER — Other Ambulatory Visit: Payer: Self-pay | Admitting: Internal Medicine

## 2016-07-19 ENCOUNTER — Encounter: Payer: Self-pay | Admitting: Internal Medicine

## 2016-07-19 ENCOUNTER — Ambulatory Visit (INDEPENDENT_AMBULATORY_CARE_PROVIDER_SITE_OTHER): Payer: PPO | Admitting: Internal Medicine

## 2016-07-19 VITALS — BP 144/102 | HR 100 | Ht 64.0 in | Wt 167.8 lb

## 2016-07-19 DIAGNOSIS — I10 Essential (primary) hypertension: Secondary | ICD-10-CM | POA: Diagnosis not present

## 2016-07-19 DIAGNOSIS — R072 Precordial pain: Secondary | ICD-10-CM

## 2016-07-19 DIAGNOSIS — I2510 Atherosclerotic heart disease of native coronary artery without angina pectoris: Secondary | ICD-10-CM | POA: Diagnosis not present

## 2016-07-19 MED ORDER — AMLODIPINE-OLMESARTAN 5-20 MG PO TABS: 2.0000 | ORAL_TABLET | Freq: Every day | ORAL | Status: DC

## 2016-07-19 MED ORDER — LISINOPRIL-HYDROCHLOROTHIAZIDE 20-25 MG PO TABS: 2.0000 | ORAL_TABLET | Freq: Every day | ORAL | 1 refills | Status: DC

## 2016-07-19 NOTE — Patient Instructions (Addendum)
Your physician has recommended you make the following change in your medication:  1.) stop lisinopril/hctz for cough 2.) start Azor (amlodipine and olmesartan) 5mg /20mg  tablet---take 2 tablets by mouth every morning.    3.) stop amlodipine Call next Monday to let us know if your cough has improved.  If it has, we will send a prescription for this medication to your pharmacy.  Your physician recommends that you schedule a follow-up appointment in: 6 weeks with Dr. Harrington Challenger.

## 2016-07-19 NOTE — Progress Notes (Signed)
Cardiology Office Note   Date:  07/19/2016   ID:  Nasrin Lanzo Junkin, DOB Feb 04, 1961, MRN [id number]  PCP:  Hoyt Koch, MD  Cardiologist:   Dorris Carnes, MD   F/U of CAD     History of Present Illness: Phila Shoaf Weible is a 56 y.o. female with a history of CAD (MI in 2006 with PTCA to LAD).  Cath In 2011:  Irreg LAD; no signif dz RCA nad LCx  LVEF 55 to 60%  Myovue in 2013 normal.  I saw her in 2013  She saw scot weaver in 2015  Myovue normal Seen by Dr Sharlet Salina for HTN   Pt notes intermittent CP  Occurs when lying down or when bending over.  L sided.  None with walking    Pt also complains of head aches (frontal and Back) Breathing is OK        Current Meds  Medication Sig  . acetaminophen (TYLENOL) 500 MG tablet Take 500 mg by mouth every 6 (six) hours as needed for headache.  [name] amLODipine (NORVASC) 10 MG tablet TAKE 1 TABLET ONCE DAILY.  [name] aspirin EC 81 MG tablet Take 81 mg by mouth daily.  . cholecalciferol (VITAMIN D) 1000 units tablet Take 1,000 Units by mouth daily.  . Cyanocobalamin (VITAMIN B 12 PO) Take by mouth.  . desvenlafaxine (PRISTIQ) 50 MG 24 hr tablet Take 1 tablet (50 mg total) by mouth daily.  [name] gabapentin (NEURONTIN) 300 MG capsule Take 1 capsule (300 mg total) by mouth 3 (three) times daily.  [name] levocetirizine (XYZAL) 5 MG tablet TAKE 1 TABLET ONCE DAILY IN THE EVENING.  [name] lisinopril-hydrochlorothiazide (PRINZIDE,ZESTORETIC) 20-25 MG tablet Take 1 tablet by mouth daily. Needs visit for refill  . Multiple Vitamin (MULTIVITAMIN WITH MINERALS) TABS tablet Take 1 tablet by mouth daily.  [name] NEXIUM 40 MG capsule TAKE 1 CAPSULE AT 12:00 NOON EACH [place].  [name] NITROSTAT 0.4 MG SL tablet DISSOLVE ONE TABLET UNDER TONGUE AS NEEDED FOR CHEST PAIN. MAY REPEATIN 5 MIN IF NEEDED-MAX OF 3.  . Omega-3 Fatty Acids (FISH OIL) 1000 MG CAPS Take by mouth.  . pravastatin (PRAVACHOL) 40 MG tablet TAKE ONE TABLET AT BEDTIME.  . vitamin C (ASCORBIC ACID) 500 MG tablet Take 500 mg by  mouth daily.     Allergies:   Clonidine hydrochloride; Ibuprofen; and Lovastatin   Past Medical History:  Diagnosis Date  . Allergy   . Anemia   . Anxiety   . CAD (coronary artery disease)    a. s/p AL MI in 2006 tx with PTCA to LAD;  b. LHC (10/2009): lum irregs in LAD, o/w no CAD, EF 55-60%;  c. Myoview (07/2011):  no ischemia, EF 63%;  d. Echo (11/2011):  mild LVH, EF 60-65%, Gr 1 DD;  e.  Lexiscan Myoview (04/2013):  Inferior bowel atten artifact with inferoapical defect; no ischemia; EF 58% (low risk)  . Cameron lesion, acute   . Chronic pain syndrome   . Depression   . Erosive esophagitis   . Fibromyalgia   . GERD (gastroesophageal reflux disease)   . Hemorrhage    upper gastrointestional. GI bleed 10/09 due to NSAID  . Hiatal hernia   . HLD (hyperlipidemia)   . HTN (hypertension)   . Myocardial infarction ([place])    x2  . Rhinitis     Past Surgical History:  Procedure Laterality Date  . BLADDER SUSPENSION N/A 05/08/2013   Procedure: TRANSVAGINAL TAPE (TVT) PROCEDURE;  Surgeon: Elta Guadeloupe  Kathline Magic, MD;  Location: [facility];  Service: Gynecology;  Laterality: N/A;  . CARDIAC CATHETERIZATION    . CYSTOSCOPY N/A 05/08/2013   Procedure: CYSTOSCOPY;  Surgeon: Olga Millers, MD;  Location: [facility];  Service: Gynecology;  Laterality: N/A;  . L breast - cyst removed  2002  . LAPAROSCOPIC ASSISTED VAGINAL HYSTERECTOMY N/A 05/08/2013   Procedure: LAPAROSCOPIC ASSISTED VAGINAL HYSTERECTOMY;  Surgeon: Olga Millers, MD;  Location: [facility];  Service: Gynecology;  Laterality: N/A;  . LAPAROSCOPIC BILATERAL SALPINGO OOPHERECTOMY N/A 05/08/2013   Procedure: LAPAROSCOPIC BILATERAL SALPINGO OOPHORECTOMY;  Surgeon: Olga Millers, MD;  Location: [facility];  Service: Gynecology;  Laterality: N/A;  . UPPER GASTROINTESTINAL ENDOSCOPY       Social History:  The patient  reports that she has been smoking.  She has never used smokeless tobacco. She reports that she drinks alcohol. She reports that she does  not use drugs.   Family History:  The patient's family history includes Colon cancer in her maternal aunt; Colon polyps in her mother; Diabetes in her maternal aunt; Heart disease in her maternal grandmother and son; Heart murmur in her mother; Hypertension in her mother.    ROS:  Please see the history of present illness. All other systems are reviewed and  Negative to the above problem except as noted.    PHYSICAL EXAM: VS:  BP (!) 144/102   Pulse 100   Ht 5\' 4"  (1.626 m)   Wt 167 lb 12.8 oz (76.1 kg)   LMP 06/07/2012   SpO2 94%   BMI 28.80 kg/m   GEN: Well nourished, well developed, in no acute distress  HEENT: normal  Neck: no JVD, carotid bruits, or masses Cardiac: RRR; no murmurs, rubs, or gallops,no edema  Respiratory:  clear to auscultation bilaterally, normal work of breathing GI: soft, nontender, nondistended, + BS  No hepatomegaly  MS: no deformity Moving all extremities   Skin: warm and dry, no rash Neuro:  Strength and sensation are intact Psych: euthymic mood, full affect   EKG:  EKG is ordered today.  SR 90 bpm  PVCss  LAFB  Septal MI     Lipid Panel    Component Value Date/Time   CHOL 179 10/02/2015 1628   TRIG 206.0 (H) 10/02/2015 1628   TRIG 444 11/14/2009   HDL 40.70 10/02/2015 1628   CHOLHDL 4 10/02/2015 1628   VLDL 41.2 (H) 10/02/2015 1628   LDLCALC 145 (H) 01/22/2014 1439   LDLDIRECT 105.0 10/02/2015 1628      Wt Readings from Last 3 Encounters:  07/19/16 167 lb 12.8 oz (76.1 kg)  06/01/16 170 lb (77.1 kg)  10/02/15 170 lb 6.4 oz (77.3 kg)      ASSESSMENT AND PLAN:  1  Chest pain  Atypcial for coronary ischemia  More muscular  2  CAD  I am not convinced of active angina  Continue meds    3  HTN  BP is up  She does have a cough  Given Rx samples for Azor  Stop amlodipine and prinizide  Try 2 Azor (5/20) qd  Call about cough in 1 wk  Will then call in for ARB Or if no change increase prinizide to 40/25 CA F/U in 6 wks.    6  HL   Continue statin      Current medicines are reviewed at length with the patient today.  The patient does not have concerns regarding medicines.  Signed, Dorris Carnes, MD  07/19/2016 1:28 PM    [facility] [place], [place], [place]  [address] Phone: [phone]; Fax: [phone]

## 2016-07-27 ENCOUNTER — Telehealth: Payer: Self-pay | Admitting: Internal Medicine

## 2016-07-27 MED ORDER — AMLODIPINE-OLMESARTAN 5-20 MG PO TABS: 2.0000 | ORAL_TABLET | Freq: Every day | ORAL | 1 refills | Status: DC

## 2016-07-27 NOTE — Telephone Encounter (Signed)
OK to use nicotine patch  Cannot smoke at same time Prob recomm 14 mg patch fro 1 wk  Then 7 mg patch for 1 to  2 wks

## 2016-07-27 NOTE — Telephone Encounter (Signed)
Called patient regarding her concern about a medication. Patient states the Azor is working for her and that she is no longer coughing. Called in [place] to her preferred pharmacy per Dr. Alan Ripper order. Called patient to let her know it has been sent to her pharmacy and she verbalized understanding on how to take the medication.   Patient wants something to help her to quit smoking, possibly a nicotine patch or something to that nature. Told patient I would send message to Dr. Harrington Challenger letting her know. Patient verbalized understanding and thanked me for my call.

## 2016-07-27 NOTE — Telephone Encounter (Signed)
Pt returning call to nurse concerning her medication. Please call pt.

## 2016-07-29 MED ORDER — NICOTINE 14 MG/24HR TD PT24: 14.0000 mg | MEDICATED_PATCH | Freq: Every day | TRANSDERMAL | 0 refills | Status: AC

## 2016-07-29 MED ORDER — NICOTINE 7 MG/24HR TD PT24: 7.0000 mg | MEDICATED_PATCH | Freq: Every day | TRANSDERMAL | 0 refills | Status: AC

## 2016-07-29 NOTE — Telephone Encounter (Signed)
Left message for patient to call back.  Per Dr. Harrington Challenger, sent prescriptions for nicotine patches 14 mg x 7 days and 7 mg x 14 days.   I tried several numbers.  Did not reach patient to inform.

## 2016-08-11 ENCOUNTER — Other Ambulatory Visit: Payer: Self-pay | Admitting: Internal Medicine

## 2016-08-29 NOTE — Progress Notes (Signed)
Cardiology Office Note   Date:  08/30/2016   ID:  Hayley Miller, DOB 1961-03-05, MRN [id number]  PCP:  Hoyt Koch, MD  Cardiologist:   Dorris Carnes, MD    F/u of CAD     History of Present Illness: Hayley Miller is a 56 y.o. female with a history of CAD (MI in 2006 with PTCA to LAD).  Cath In 2011:  Irreg LAD; no signif dz RCA nad LCx  LVEF 55 to 60%  Myovue in 2013 normal.  I saw her in 2013  She saw scot weaver in 2015  Myovue normal Seen by Dr Sharlet Salina for HTN  Intermitt CPwht bending or ying  Down  None while walking   Pt was seen in April 2018  Recomm Azor   SInce then she has been itching all over   She says that it was since she started Azor  No wheezing, no swelling   Tired all the time   Smoking 1 ppd  Wants to quit   Pt denies CP      Current Meds  Medication Sig  . acetaminophen (TYLENOL) 500 MG tablet Take 500 mg by mouth every 6 (six) hours as needed for headache.  [name] amLODipine-olmesartan (AZOR) 5-20 MG tablet Take 2 tablets by mouth daily.  [name] aspirin EC 81 MG tablet Take 81 mg by mouth daily.  . cholecalciferol (VITAMIN D) 1000 units tablet Take 1,000 Units by mouth daily.  . Cyanocobalamin (VITAMIN B 12 PO) Take by mouth.  . desvenlafaxine (PRISTIQ) 50 MG 24 hr tablet Take 1 tablet (50 mg total) by mouth daily.  [name] gabapentin (NEURONTIN) 300 MG capsule Take 1 capsule (300 mg total) by mouth 3 (three) times daily.  [name] levocetirizine (XYZAL) 5 MG tablet TAKE 1 TABLET ONCE DAILY IN THE EVENING.  . Multiple Vitamin (MULTIVITAMIN WITH MINERALS) TABS tablet Take 1 tablet by mouth daily.  [name] NEXIUM 40 MG capsule TAKE 1 CAPSULE AT 12:00 NOON EACH [place].  [name] NITROSTAT 0.4 MG SL tablet DISSOLVE ONE TABLET UNDER TONGUE AS NEEDED FOR CHEST PAIN. MAY REPEATIN 5 MIN IF NEEDED-MAX OF 3.  . Omega-3 Fatty Acids (FISH OIL) 1000 MG CAPS Take by mouth.  . pravastatin (PRAVACHOL) 40 MG tablet TAKE ONE TABLET AT BEDTIME.  . vitamin C (ASCORBIC ACID) 500 MG tablet Take 500 mg by  mouth daily.     Allergies:   Clonidine; Clonidine hydrochloride; Codeine; Ibuprofen; and Lovastatin   Past Medical History:  Diagnosis Date  . Allergy   . Anemia   . Anxiety   . CAD (coronary artery disease)    a. s/p AL MI in 2006 tx with PTCA to LAD;  b. LHC (10/2009): lum irregs in LAD, o/w no CAD, EF 55-60%;  c. Myoview (07/2011):  no ischemia, EF 63%;  d. Echo (11/2011):  mild LVH, EF 60-65%, Gr 1 DD;  e.  Lexiscan Myoview (04/2013):  Inferior bowel atten artifact with inferoapical defect; no ischemia; EF 58% (low risk)  . Cameron lesion, acute   . Chronic pain syndrome   . Depression   . Erosive esophagitis   . Fibromyalgia   . GERD (gastroesophageal reflux disease)   . Hemorrhage    upper gastrointestional. GI bleed 10/09 due to NSAID  . Hiatal hernia   . HLD (hyperlipidemia)   . HTN (hypertension)   . Myocardial infarction ([place])    x2  . Rhinitis     Past Surgical History:  Procedure Laterality  Date  . BLADDER SUSPENSION N/A 05/08/2013   Procedure: TRANSVAGINAL TAPE (TVT) PROCEDURE;  Surgeon: Olga Millers, MD;  Location: [facility];  Service: Gynecology;  Laterality: N/A;  . CARDIAC CATHETERIZATION    . CYSTOSCOPY N/A 05/08/2013   Procedure: CYSTOSCOPY;  Surgeon: Olga Millers, MD;  Location: [facility];  Service: Gynecology;  Laterality: N/A;  . L breast - cyst removed  2002  . LAPAROSCOPIC ASSISTED VAGINAL HYSTERECTOMY N/A 05/08/2013   Procedure: LAPAROSCOPIC ASSISTED VAGINAL HYSTERECTOMY;  Surgeon: Olga Millers, MD;  Location: [facility];  Service: Gynecology;  Laterality: N/A;  . LAPAROSCOPIC BILATERAL SALPINGO OOPHERECTOMY N/A 05/08/2013   Procedure: LAPAROSCOPIC BILATERAL SALPINGO OOPHORECTOMY;  Surgeon: Olga Millers, MD;  Location: [facility];  Service: Gynecology;  Laterality: N/A;  . UPPER GASTROINTESTINAL ENDOSCOPY       Social History:  The patient  reports that she has been smoking.  She has never used smokeless tobacco. She reports that she drinks alcohol. She  reports that she does not use drugs.   Family History:  The patient's family history includes Colon cancer in her maternal aunt; Colon polyps in her mother; Diabetes in her maternal aunt; Heart disease in her maternal grandmother and son; Heart murmur in her mother; Hypertension in her mother.    ROS:  Please see the history of present illness. All other systems are reviewed and  Negative to the above problem except as noted.    PHYSICAL EXAM: VS:  BP (!) 144/98   Pulse 99   Ht 5\' 4"  (1.626 m)   Wt 77.9 kg (171 lb 12.8 oz)   LMP 06/07/2012   SpO2 98%   BMI 29.49 kg/m   GEN: Well nourished, well developed, in no acute distress  HEENT: normal  Neck: no JVD, carotid bruits, or masses Cardiac: RRR; no murmurs, rubs, or gallops,no edema  Respiratory:  clear to auscultation bilaterally, normal work of breathing GI: soft, nontender, nondistended, + BS  No hepatomegaly  MS: no deformity Moving all extremities   Skin: warm and dry  No distinct rash   Neuro:  Strength and sensation are intact Psych: euthymic mood, full affect   EKG:  EKG is not  ordered today.   Lipid Panel    Component Value Date/Time   CHOL 179 10/02/2015 1628   TRIG 206.0 (H) 10/02/2015 1628   TRIG 444 11/14/2009   HDL 40.70 10/02/2015 1628   CHOLHDL 4 10/02/2015 1628   VLDL 41.2 (H) 10/02/2015 1628   LDLCALC 145 (H) 01/22/2014 1439   LDLDIRECT 105.0 10/02/2015 1628      Wt Readings from Last 3 Encounters:  08/30/16 77.9 kg (171 lb 12.8 oz)  07/19/16 76.1 kg (167 lb 12.8 oz)  06/01/16 77.1 kg (170 lb)      ASSESSMENT AND PLAN:  1  HTN  BP is high here and at home  I do nto see a distinct rash.  Not convinced she is allergic but I would stop azor Try amlodipine 10  Add Hyzaar 100/12.5  F/u in 3 to 4 wks  2  Lipids  COntinue statin  3  Fatigue  Check TSH  I am not convinced this represents anginal equivalent    4  CAD  I am not convinced of active angina  Follow     Current medicines are  reviewed at length with the patient today.  The patient does not have concerns regarding medicines.  Signed, Dorris Carnes, MD  08/30/2016 1:32 PM  [facility] [place], [place], [place]  [address] Phone: [phone]; Fax: [phone]

## 2016-08-30 ENCOUNTER — Ambulatory Visit (INDEPENDENT_AMBULATORY_CARE_PROVIDER_SITE_OTHER): Payer: PPO | Admitting: Internal Medicine

## 2016-08-30 ENCOUNTER — Encounter: Payer: Self-pay | Admitting: Internal Medicine

## 2016-08-30 ENCOUNTER — Encounter (INDEPENDENT_AMBULATORY_CARE_PROVIDER_SITE_OTHER): Payer: Self-pay

## 2016-08-30 VITALS — BP 144/98 | HR 99 | Ht 64.0 in | Wt 171.8 lb

## 2016-08-30 DIAGNOSIS — R5383 Other fatigue: Secondary | ICD-10-CM

## 2016-08-30 DIAGNOSIS — I10 Essential (primary) hypertension: Secondary | ICD-10-CM | POA: Diagnosis not present

## 2016-08-30 DIAGNOSIS — I2510 Atherosclerotic heart disease of native coronary artery without angina pectoris: Secondary | ICD-10-CM

## 2016-08-30 MED ORDER — LOSARTAN POTASSIUM-HCTZ 100-12.5 MG PO TABS: 1.0000 | ORAL_TABLET | Freq: Every day | ORAL | 11 refills | Status: DC

## 2016-08-30 MED ORDER — AMLODIPINE BESYLATE 10 MG PO TABS: 10.0000 mg | ORAL_TABLET | Freq: Every day | ORAL | 3 refills | Status: DC

## 2016-08-30 NOTE — Patient Instructions (Addendum)
Medication Instructions:  STOP Azor (Amlodipine/olmesartan) START Hyzaar (losartan/hctz) 100/12.5 mg once daily START Amlodipine 10 mg once daily   Labwork: TODAY - TSH   Testing/Procedures: None Ordered   Follow-Up: Your physician recommends that you return for follow-up with Dr. Harrington Challenger on June 29 at 3:20 pm   If you need a refill on your cardiac medications before your next appointment, please call your pharmacy.   Thank you for choosing [facility]! Christen Bame, RN [phone]

## 2016-08-31 LAB — TSH: TSH: 1.54 u[IU]/mL (ref 0.450–4.500)

## 2016-09-24 ENCOUNTER — Ambulatory Visit: Payer: Self-pay | Admitting: Internal Medicine

## 2016-10-19 ENCOUNTER — Other Ambulatory Visit: Payer: Self-pay | Admitting: Pharmacy Technician

## 2016-10-19 NOTE — Patient Outreach (Signed)
[facility] [facility]) Care Management  10/19/2016  Karolyn Messing Key 09/08/1960 [id number]   I contacted patient in reference to medication adherence for [employer]. The medication in question is Pravastatin which the patient has not refilled since 05-31-16. I left a voicemail for the patient to return my call at her earliest convenience. If I do not hear from the patient next couple of days I will attempt to reach out to her again.  Doreene Burke, [place] [phone]

## 2016-10-29 ENCOUNTER — Other Ambulatory Visit: Payer: Self-pay | Admitting: Internal Medicine

## 2016-11-26 ENCOUNTER — Other Ambulatory Visit: Payer: Self-pay | Admitting: Internal Medicine

## 2016-12-28 ENCOUNTER — Encounter: Payer: Self-pay | Admitting: Internal Medicine

## 2016-12-28 ENCOUNTER — Ambulatory Visit (INDEPENDENT_AMBULATORY_CARE_PROVIDER_SITE_OTHER): Payer: PPO | Admitting: Internal Medicine

## 2016-12-28 DIAGNOSIS — J0111 Acute recurrent frontal sinusitis: Secondary | ICD-10-CM

## 2016-12-28 MED ORDER — AMOXICILLIN-POT CLAVULANATE 875-125 MG PO TABS: 1.0000 | ORAL_TABLET | Freq: Two times a day (BID) | ORAL | 0 refills | Status: DC

## 2016-12-28 NOTE — Progress Notes (Signed)
   Subjective:    Patient ID: Hayley Miller, female    DOB: [birth year], 56 y.o.   MRN: [id number]  HPI The patient is a 56 YO female coming in for sinus infection. Tried taking allergy medicine but it has not helped. She denies fevers but some chills. She is having pain and headaches and drainage. She has had worsening since onset about 2 weeks ago. Still smoking. Denies significant cough or SOB. Some clear phlegm which is normal for her.   Review of Systems  Constitutional: Negative.   HENT: Positive for congestion, postnasal drip, rhinorrhea, sinus pain and sinus pressure. Negative for ear discharge, ear pain, sore throat, tinnitus and trouble swallowing.   Eyes: Negative.   Respiratory: Positive for cough. Negative for chest tightness and shortness of breath.   Cardiovascular: Negative for chest pain, palpitations and leg swelling.  Gastrointestinal: Negative for abdominal distention, abdominal pain, constipation, diarrhea, nausea and vomiting.  Musculoskeletal: Negative.   Skin: Negative.       Objective:   Physical Exam  Constitutional: She appears well-developed and well-nourished.  HENT:  Head: Normocephalic and atraumatic.  Frontal sinus pressure, oropharynx with redness and yellow drainage.   Eyes: Pupils are equal, round, and reactive to light. EOM are normal.  Neck: Normal range of motion. No tracheal deviation present. No thyromegaly present.  Cardiovascular: Normal rate and regular rhythm.   Pulmonary/Chest: Effort normal. No respiratory distress. She has no wheezes. She has no rales.  Abdominal: Soft.  Lymphadenopathy:    She has no cervical adenopathy.  Skin: Skin is warm and dry.   Vitals:   12/28/16 1612  BP: (!) 150/90  Pulse: 88  Temp: 98.4 F (36.9 C)  TempSrc: Oral  SpO2: 99%  Weight: 169 lb (76.7 kg)  Height: 5\' 4"  (1.626 m)      Assessment & Plan:

## 2016-12-28 NOTE — Patient Instructions (Signed)
We have sent in the augmentin for the sinuses. Take 1 pill twice a [place] for 10 days.

## 2016-12-29 NOTE — Assessment & Plan Note (Signed)
Rx for augmentin for recurrent sinusitis.

## 2017-02-28 ENCOUNTER — Other Ambulatory Visit: Payer: Self-pay | Admitting: Internal Medicine

## 2017-02-28 ENCOUNTER — Other Ambulatory Visit: Payer: Self-pay | Admitting: Family

## 2017-03-01 ENCOUNTER — Other Ambulatory Visit: Payer: Self-pay | Admitting: Internal Medicine

## 2017-03-11 ENCOUNTER — Other Ambulatory Visit: Payer: Self-pay | Admitting: Internal Medicine

## 2017-04-28 ENCOUNTER — Ambulatory Visit: Payer: Self-pay | Admitting: Family Medicine

## 2017-04-29 ENCOUNTER — Ambulatory Visit (INDEPENDENT_AMBULATORY_CARE_PROVIDER_SITE_OTHER): Payer: Medicare Other | Admitting: Internal Medicine

## 2017-04-29 ENCOUNTER — Encounter: Payer: Self-pay | Admitting: Internal Medicine

## 2017-04-29 VITALS — BP 150/100 | HR 104 | Temp 98.1°F | Ht 64.0 in | Wt 169.0 lb

## 2017-04-29 DIAGNOSIS — I10 Essential (primary) hypertension: Secondary | ICD-10-CM | POA: Diagnosis not present

## 2017-04-29 DIAGNOSIS — R110 Nausea: Secondary | ICD-10-CM

## 2017-04-29 DIAGNOSIS — R6889 Other general symptoms and signs: Secondary | ICD-10-CM | POA: Insufficient documentation

## 2017-04-29 DIAGNOSIS — R040 Epistaxis: Secondary | ICD-10-CM | POA: Diagnosis not present

## 2017-04-29 MED ORDER — CETIRIZINE HCL 10 MG PO TABS: 10.0000 mg | ORAL_TABLET | Freq: Every day | ORAL | 0 refills | Status: DC

## 2017-04-29 MED ORDER — ONDANSETRON HCL 4 MG PO TABS: 4.0000 mg | ORAL_TABLET | Freq: Three times a day (TID) | ORAL | 0 refills | Status: DC | PRN

## 2017-04-29 MED ORDER — NITROGLYCERIN 0.4 MG SL SUBL: 0.4000 mg | SUBLINGUAL_TABLET | SUBLINGUAL | 0 refills | Status: DC | PRN

## 2017-04-29 MED ORDER — HYDROCODONE-HOMATROPINE 5-1.5 MG/5ML PO SYRP: 5.0000 mL | ORAL_SOLUTION | Freq: Three times a day (TID) | ORAL | 0 refills | Status: DC | PRN

## 2017-04-29 MED ORDER — ONDANSETRON HCL 4 MG/2ML IJ SOLN
4.0000 mg | Freq: Once | INTRAMUSCULAR | Status: AC
  Administered 2017-04-29: 4 mg via INTRAMUSCULAR

## 2017-04-29 NOTE — Patient Instructions (Signed)
We have sent in the cough medicine that you can use up to 3 times per [place].   We have sent in nausea medicine that you can take every 6 hours as needed.   Make sure to drink plenty of fluids like pedia-lyte or gatorade to help replace the fluids from diarrhea. You can try some foods as well and try some bland foods first like soups, rice, toast, bananas.   This is likely the flu and we can only help the symptoms. We have sent in the cough medicine to use and also zyrtec (cetirizine) to take daily to help dry up the drainage.   For the nose bleed you can get affrin over the counter which you can use for nose bleed to help stop bleeding. Avoid blowing or picking for the next 5 days as the skin in the nose is very sensitive. It is okay to use saline nose spray to help rinse it. You can use vaseline on the rim of the nose which can help moisturize the nose tissue to help it heal sooner.   Nosebleed, Adult A nosebleed is when blood comes out of the nose. Nosebleeds are common. Usually, they are not a sign of a serious condition. Nosebleeds can happen if a small blood vessel in your nose starts to bleed or if the lining of your nose (mucous membrane) cracks. They are commonly caused by:  Allergies.  Colds.  Picking your nose.  Blowing your nose too hard.  An injury from sticking an object into your nose or getting hit in the nose.  Dry or cold air.  Less common causes of nosebleeds include:  Toxic fumes.  Something abnormal in the nose or in the air-filled spaces in the bones of the face (sinuses).  Growths in the nose, such as polyps.  Medicines or conditions that cause blood to clot slowly.  Certain illnesses or procedures that irritate or dry out the nasal passages.  Follow these instructions at home: When you have a nosebleed:  Sit down and tilt your head slightly forward.  Use a clean towel or tissue to pinch your nostrils under the bony part of your nose. After 10 minutes,  let go of your nose and see if bleeding starts again. Do not release pressure before that time. If there is still bleeding, repeat the pinching and holding for 10 minutes until the bleeding stops.  Do not place tissues or gauze in the nose to stop bleeding.  Avoid lying down and avoid tilting your head backward. That may make blood collect in the throat and cause gagging or coughing.  Use a nasal spray decongestant to help with a nosebleed as told by your health care provider.  Do not use petroleum jelly or mineral oil in your nose. It can drip into your lungs. After a nosebleed:  Avoid blowing your nose or sniffing for a number of hours.  Avoid straining, lifting, or bending at the waist for several days. You may resume other normal activities as you are able.  Use saline spray or a humidifier as told by your health care provider.  Aspirinand blood thinners make bleeding more likely. If you are prescribed these medicines and you suffer from nosebleeds: ? Ask your health care provider if you should stop taking the medicines or if you should adjust the dose. ? Do not stop taking medicines that your health care provider has recommended unless told by your health care provider.  If your nosebleed was caused by  dry mucous membranes, use over-the-counter saline nasal spray or gel. This will keep the mucous membranes moist and allow them to heal. If you must use a lubricant: ? Choose one that is water-soluble. ? Use only as much as you need and use it only as often as needed. ? Do not lie down until several hours after you use it. Contact a health care provider if:  You have a fever.  You get nosebleeds often or more often than usual.  You bruise very easily.  You have a nosebleed from having something stuck in your nose.  You have bleeding in your mouth.  You vomit or cough up brown material.  You have a nosebleed after you start a new medicine. Get help right away if:  You have  a nosebleed after a fall or a head injury.  Your nosebleed does not go away after 20 minutes.  You feel dizzy or weak.  You have unusual bleeding from other parts of your body.  You have unusual bruising on other parts of your body.  You become sweaty.  You vomit blood. This information is not intended to replace advice given to you by your health care provider. Make sure you discuss any questions you have with your health care provider. Document Released: 12/23/2004 Document Revised: 11/13/2015 Document Reviewed: 09/30/2015 Elsevier Interactive Patient Education  2018 [place] Diet A bland diet consists of foods that do not have a lot of fat or fiber. Foods without fat or fiber are easier for the body to digest. They are also less likely to irritate your mouth, throat, stomach, and other parts of your gastrointestinal tract. A bland diet is sometimes called a BRAT diet. What is my plan? Your health care provider or dietitian may recommend specific changes to your diet to prevent and treat your symptoms, such as:  Eating small meals often.  Cooking food until it is soft enough to chew easily.  Chewing your food well.  Drinking fluids slowly.  Not eating foods that are very spicy, sour, or fatty.  Not eating citrus fruits, such as oranges and grapefruit.  What do I need to know about this diet?  Eat a variety of foods from the bland diet food list.  Do not follow a bland diet longer than you have to.  Ask your health care provider whether you should take vitamins. What foods can I eat? Grains  Hot cereals, such as cream of wheat. Bread, crackers, or tortillas made from refined white flour. Rice. Vegetables Canned or cooked vegetables. Mashed or boiled potatoes. Fruits Bananas. Applesauce. Other types of cooked or canned fruit with the skin and seeds removed, such as canned peaches or pears. Meats and Other Protein Sources Scrambled eggs. Creamy peanut  butter or other nut butters. Lean, well-cooked meats, such as chicken or fish. Tofu. Soups or broths. Dairy Low-fat dairy products, such as milk, cottage cheese, or yogurt. Beverages Water. Herbal tea. Apple juice. Sweets and Desserts Pudding. Custard. Fruit gelatin. Ice cream. Fats and Oils Mild salad dressings. Canola or olive oil. The items listed above may not be a complete list of allowed foods or beverages. Contact your dietitian for more options. What foods are not recommended? Foods and ingredients that are often not recommended include:  Spicy foods, such as hot sauce or salsa.  Fried foods.  Sour foods, such as pickled or fermented foods.  Raw vegetables or fruits, especially citrus or berries.  Caffeinated drinks.  Alcohol.  Strongly flavored seasonings or condiments.  The items listed above may not be a complete list of foods and beverages that are not allowed. Contact your dietitian for more information. This information is not intended to replace advice given to you by your health care provider. Make sure you discuss any questions you have with your health care provider. Document Released: 07/07/2015 Document Revised: 08/21/2015 Document Reviewed: 03/27/2014 Elsevier Interactive Patient Education  2018 [employer].    Influenza, Adult Influenza ("the flu") is an infection in the lungs, nose, and throat (respiratory tract). It is caused by a virus. The flu causes many common cold symptoms, as well as a high fever and body aches. It can make you feel very sick. The flu spreads easily from person to person (is contagious). Getting a flu shot (influenza vaccination) every year is the best way to prevent the flu. Follow these instructions at home:  Take over-the-counter and prescription medicines only as told by your doctor.  Use a cool mist humidifier to add moisture (humidity) to the air in your home. This can make it easier to breathe.  Rest as  needed.  Drink enough fluid to keep your pee (urine) clear or pale yellow.  Cover your mouth and nose when you cough or sneeze.  Wash your hands with soap and water often, especially after you cough or sneeze. If you cannot use soap and water, use hand sanitizer.  Stay home from work or school as told by your doctor. Unless you are visiting your doctor, try to avoid leaving home until your fever has been gone for 24 hours without the use of medicine.  Keep all follow-up visits as told by your doctor. This is important. How is this prevented?  Getting a yearly (annual) flu shot is the best way to avoid getting the flu. You may get the flu shot in late summer, fall, or winter. Ask your doctor when you should get your flu shot.  Wash your hands often or use hand sanitizer often.  Avoid contact with people who are sick during cold and flu season.  Eat healthy foods.  Drink plenty of fluids.  Get enough sleep.  Exercise regularly. Contact a doctor if:  You get new symptoms.  You have: ? Chest pain. ? Watery poop (diarrhea). ? A fever.  Your cough gets worse.  You start to have more mucus.  You feel sick to your stomach (nauseous).  You throw up (vomit). Get help right away if:  You start to be short of breath or have trouble breathing.  Your skin or nails turn a bluish color.  You have very bad pain or stiffness in your neck.  You get a sudden headache.  You get sudden pain in your face or ear.  You cannot stop throwing up. This information is not intended to replace advice given to you by your health care provider. Make sure you discuss any questions you have with your health care provider. Document Released: 12/23/2007 Document Revised: 08/21/2015 Document Reviewed: 01/07/2015 Elsevier Interactive Patient Education  2017 [employer].

## 2017-04-29 NOTE — Assessment & Plan Note (Signed)
Blood crusted on exam and counseled on avoiding blowing and picking. Affrin prn for bleeding which will not stop. Demonstrated how to stop bleeding and how long to hold to stop bleeding.

## 2017-04-29 NOTE — Assessment & Plan Note (Signed)
Outside window for tamiflu. Given zofran IM today. Rx for zofran today as well as hydrocodone cough syrup. [place] narcotic database reviewed and no inappropriate fills. Rx for zyrtec for congestion. Advised plenty of liquids. Advised of return parameters and when to seek care in the ER if not able to keep down liquids or vomiting or diarrhea excessively and getting dizzy.

## 2017-04-29 NOTE — Progress Notes (Signed)
   Subjective:    Patient ID: Hayley Miller, female    DOB: 12/28/1960, 57 y.o.   MRN: [id number]  HPI The patient is a 57 YO female coming in sick. Started about 3 days ago with vomiting and diarrhea. She is having some fevers as well. She is having some cough and cold symptoms as well. Some body aches and fatigue. She had a nose bleed this morning and had EMS come help her stop it. They gave her some affrin in her nose. This stopped the bleeding and it has not come back. She is being very careful with blowing her nose. No picking. She is worsening over the week. No vomiting today but threw up last night. Diarrhea is mostly loose and denies blood. She is limiting foods and drinking some liquids mostly soda or water. Still smoking some but not as much lately. Has not tried anything for it yet as she cannot keep anything down.   Review of Systems  Constitutional: Positive for activity change, appetite change, chills and fever. Negative for fatigue and unexpected weight change.  HENT: Positive for congestion, postnasal drip, rhinorrhea and sinus pressure. Negative for ear discharge, ear pain, sinus pain, sneezing, sore throat, tinnitus, trouble swallowing and voice change.   Eyes: Negative.   Respiratory: Positive for cough. Negative for chest tightness, shortness of breath and wheezing.   Cardiovascular: Negative.   Gastrointestinal: Positive for diarrhea, nausea and vomiting. Negative for abdominal distention, abdominal pain, blood in stool and constipation.  Musculoskeletal: Positive for myalgias.  Neurological: Negative.       Objective:   Physical Exam  Constitutional: She is oriented to person, place, and time. She appears well-developed and well-nourished. She appears distressed.  Appears ill  HENT:  Head: Normocephalic and atraumatic.  Oropharynx with redness and clear drainage, nose with swollen turbinates and blood crusted bilaterally, TMs normal bilaterally  Eyes: EOM are normal.    Neck: Normal range of motion. No thyromegaly present.  Cardiovascular: Normal rate and regular rhythm.  Pulmonary/Chest: Effort normal and breath sounds normal. No respiratory distress. She has no wheezes. She has no rales.  Abdominal: Soft. Bowel sounds are normal. She exhibits no distension. There is no tenderness. There is no rebound.  Musculoskeletal: She exhibits tenderness.  Lymphadenopathy:    She has cervical adenopathy.  Neurological: She is alert and oriented to person, place, and time.  Skin: Skin is warm and dry.   Vitals:   04/29/17 1033  BP: (!) 150/100  Pulse: (!) 104  Temp: 98.1 F (36.7 C)  TempSrc: Oral  SpO2: 98%  Weight: 169 lb (76.7 kg)  Height: 5\' 4"  (1.626 m)      Assessment & Plan:  Zofran IM given at visit.

## 2017-04-29 NOTE — Assessment & Plan Note (Signed)
Previously better but has not taken meds due to vomiting and nausea. She will resume once she gets the nausea meds in her system.

## 2017-05-18 ENCOUNTER — Other Ambulatory Visit: Payer: Self-pay | Admitting: Internal Medicine

## 2017-07-18 ENCOUNTER — Emergency Department (HOSPITAL_COMMUNITY): Payer: Medicare Other

## 2017-07-18 ENCOUNTER — Observation Stay (HOSPITAL_COMMUNITY)
Admission: EM | Admit: 2017-07-18 | Discharge: 2017-07-21 | Disposition: A | Discharge status: Home / Self Care | Payer: Medicare Other | Attending: Family Medicine | Admitting: Family Medicine

## 2017-07-18 ENCOUNTER — Encounter (HOSPITAL_COMMUNITY): Payer: Self-pay | Admitting: Emergency Medicine

## 2017-07-18 DIAGNOSIS — K219 Gastro-esophageal reflux disease without esophagitis: Secondary | ICD-10-CM | POA: Insufficient documentation

## 2017-07-18 DIAGNOSIS — I160 Hypertensive urgency: Secondary | ICD-10-CM | POA: Diagnosis present

## 2017-07-18 DIAGNOSIS — F329 Major depressive disorder, single episode, unspecified: Secondary | ICD-10-CM | POA: Diagnosis not present

## 2017-07-18 DIAGNOSIS — E785 Hyperlipidemia, unspecified: Secondary | ICD-10-CM | POA: Diagnosis not present

## 2017-07-18 DIAGNOSIS — R7989 Other specified abnormal findings of blood chemistry: Secondary | ICD-10-CM | POA: Diagnosis not present

## 2017-07-18 DIAGNOSIS — I25110 Atherosclerotic heart disease of native coronary artery with unstable angina pectoris: Secondary | ICD-10-CM | POA: Diagnosis not present

## 2017-07-18 DIAGNOSIS — I10 Essential (primary) hypertension: Secondary | ICD-10-CM | POA: Diagnosis not present

## 2017-07-18 DIAGNOSIS — Z955 Presence of coronary angioplasty implant and graft: Secondary | ICD-10-CM | POA: Diagnosis not present

## 2017-07-18 DIAGNOSIS — F419 Anxiety disorder, unspecified: Secondary | ICD-10-CM | POA: Diagnosis not present

## 2017-07-18 DIAGNOSIS — I700 Atherosclerosis of aorta: Secondary | ICD-10-CM | POA: Diagnosis not present

## 2017-07-18 DIAGNOSIS — F17210 Nicotine dependence, cigarettes, uncomplicated: Secondary | ICD-10-CM | POA: Insufficient documentation

## 2017-07-18 DIAGNOSIS — R778 Other specified abnormalities of plasma proteins: Secondary | ICD-10-CM

## 2017-07-18 DIAGNOSIS — R079 Chest pain, unspecified: Secondary | ICD-10-CM | POA: Diagnosis present

## 2017-07-18 DIAGNOSIS — M797 Fibromyalgia: Secondary | ICD-10-CM | POA: Insufficient documentation

## 2017-07-18 DIAGNOSIS — E162 Hypoglycemia, unspecified: Secondary | ICD-10-CM | POA: Diagnosis present

## 2017-07-18 DIAGNOSIS — R112 Nausea with vomiting, unspecified: Secondary | ICD-10-CM | POA: Diagnosis not present

## 2017-07-18 DIAGNOSIS — R071 Chest pain on breathing: Secondary | ICD-10-CM | POA: Diagnosis not present

## 2017-07-18 DIAGNOSIS — Z885 Allergy status to narcotic agent status: Secondary | ICD-10-CM | POA: Diagnosis not present

## 2017-07-18 DIAGNOSIS — K5730 Diverticulosis of large intestine without perforation or abscess without bleeding: Secondary | ICD-10-CM | POA: Diagnosis not present

## 2017-07-18 DIAGNOSIS — I214 Non-ST elevation (NSTEMI) myocardial infarction: Secondary | ICD-10-CM | POA: Diagnosis present

## 2017-07-18 DIAGNOSIS — R748 Abnormal levels of other serum enzymes: Secondary | ICD-10-CM | POA: Diagnosis present

## 2017-07-18 DIAGNOSIS — E876 Hypokalemia: Secondary | ICD-10-CM | POA: Diagnosis not present

## 2017-07-18 DIAGNOSIS — R0789 Other chest pain: Secondary | ICD-10-CM | POA: Diagnosis not present

## 2017-07-18 DIAGNOSIS — G894 Chronic pain syndrome: Secondary | ICD-10-CM | POA: Insufficient documentation

## 2017-07-18 DIAGNOSIS — Z7982 Long term (current) use of aspirin: Secondary | ICD-10-CM | POA: Diagnosis not present

## 2017-07-18 DIAGNOSIS — R0602 Shortness of breath: Secondary | ICD-10-CM | POA: Diagnosis not present

## 2017-07-18 DIAGNOSIS — Z8249 Family history of ischemic heart disease and other diseases of the circulatory system: Secondary | ICD-10-CM | POA: Diagnosis not present

## 2017-07-18 DIAGNOSIS — R51 Headache: Secondary | ICD-10-CM | POA: Diagnosis not present

## 2017-07-18 DIAGNOSIS — I2510 Atherosclerotic heart disease of native coronary artery without angina pectoris: Secondary | ICD-10-CM | POA: Diagnosis present

## 2017-07-18 DIAGNOSIS — I252 Old myocardial infarction: Secondary | ICD-10-CM | POA: Insufficient documentation

## 2017-07-18 HISTORY — DX: Other chronic pain: G89.29

## 2017-07-18 HISTORY — DX: Headache, unspecified: R51.9

## 2017-07-18 HISTORY — DX: Other seasonal allergic rhinitis: J30.2

## 2017-07-18 HISTORY — DX: Low back pain, unspecified: M54.50

## 2017-07-18 HISTORY — DX: Headache: R51

## 2017-07-18 HISTORY — DX: Low back pain: M54.5

## 2017-07-18 LAB — CBC WITH DIFFERENTIAL/PLATELET
Basophils Absolute: 0 10*3/uL (ref 0.0–0.1)
Basophils Relative: 0 %
EOS ABS: 0.1 10*3/uL (ref 0.0–0.7)
EOS PCT: 1 %
HCT: 44.8 % (ref 36.0–46.0)
Hemoglobin: 15.1 g/dL — ABNORMAL HIGH (ref 12.0–15.0)
LYMPHS ABS: 2.2 10*3/uL (ref 0.7–4.0)
LYMPHS PCT: 26 %
MCH: 31.7 pg (ref 26.0–34.0)
MCHC: 33.7 g/dL (ref 30.0–36.0)
MCV: 94.1 fL (ref 78.0–100.0)
MONO ABS: 0.5 10*3/uL (ref 0.1–1.0)
MONOS PCT: 6 %
Neutro Abs: 5.8 10*3/uL (ref 1.7–7.7)
Neutrophils Relative %: 67 %
PLATELETS: 367 10*3/uL (ref 150–400)
RBC: 4.76 MIL/uL (ref 3.87–5.11)
RDW: 13.6 % (ref 11.5–15.5)
WBC: 8.6 10*3/uL (ref 4.0–10.5)

## 2017-07-18 LAB — COMPREHENSIVE METABOLIC PANEL
ALBUMIN: 2.2 g/dL — AB (ref 3.5–5.0)
ALT: 12 U/L — AB (ref 14–54)
AST: 45 U/L — AB (ref 15–41)
Alkaline Phosphatase: 34 U/L — ABNORMAL LOW (ref 38–126)
Anion gap: 6 (ref 5–15)
BILIRUBIN TOTAL: 1 mg/dL (ref 0.3–1.2)
BUN: 16 mg/dL (ref 6–20)
CHLORIDE: 122 mmol/L — AB (ref 101–111)
CO2: 17 mmol/L — ABNORMAL LOW (ref 22–32)
CREATININE: 0.68 mg/dL (ref 0.44–1.00)
Calcium: 5.7 mg/dL — CL (ref 8.9–10.3)
GFR calc Af Amer: >60 mL/min (ref >60)
GLUCOSE: 62 mg/dL — AB (ref 65–99)
POTASSIUM: 3.7 mmol/L (ref 3.5–5.1)
Sodium: 145 mmol/L (ref 135–145)
Total Protein: 3.7 g/dL — ABNORMAL LOW (ref 6.5–8.1)

## 2017-07-18 LAB — RAPID URINE DRUG SCREEN, HOSP PERFORMED
AMPHETAMINES: NOT DETECTED
BENZODIAZEPINES: NOT DETECTED
Barbiturates: NOT DETECTED
COCAINE: NOT DETECTED
Opiates: NOT DETECTED
Tetrahydrocannabinol: NOT DETECTED

## 2017-07-18 LAB — I-STAT TROPONIN, ED: Troponin i, poc: 0.18 ng/mL (ref 0.00–0.08)

## 2017-07-18 LAB — BRAIN NATRIURETIC PEPTIDE: B NATRIURETIC PEPTIDE 5: 62 pg/mL (ref 0.0–100.0)

## 2017-07-18 LAB — TROPONIN I: TROPONIN I: 0.11 ng/mL — AB (ref <0.03)

## 2017-07-18 LAB — CBG MONITORING, ED: Glucose-Capillary: 117 mg/dL — ABNORMAL HIGH (ref 65–99)

## 2017-07-18 LAB — LIPASE, BLOOD: Lipase: 23 U/L (ref 11–51)

## 2017-07-18 MED ORDER — NITROGLYCERIN 0.4 MG SL SUBL: 0.4000 mg | SUBLINGUAL_TABLET | SUBLINGUAL | Status: DC | PRN

## 2017-07-18 MED ORDER — GI COCKTAIL ~~LOC~~
30.0000 mL | Freq: Once | ORAL | Status: AC
  Administered 2017-07-18: 30 mL via ORAL
  Filled 2017-07-18: qty 30

## 2017-07-18 MED ORDER — METOCLOPRAMIDE HCL 5 MG/ML IJ SOLN
10.0000 mg | Freq: Once | INTRAMUSCULAR | Status: AC
  Administered 2017-07-18: 10 mg via INTRAVENOUS
  Filled 2017-07-18: qty 2

## 2017-07-18 MED ORDER — LABETALOL HCL 5 MG/ML IV SOLN
20.0000 mg | Freq: Once | INTRAVENOUS | Status: AC
  Administered 2017-07-18: 20 mg via INTRAVENOUS
  Filled 2017-07-18: qty 4

## 2017-07-18 MED ORDER — IOPAMIDOL (ISOVUE-370) INJECTION 76%
INTRAVENOUS | Status: AC
  Filled 2017-07-18: qty 100

## 2017-07-18 MED ORDER — IOPAMIDOL (ISOVUE-370) INJECTION 76%
100.0000 mL | Freq: Once | INTRAVENOUS | Status: AC | PRN
  Administered 2017-07-18: 100 mL via INTRAVENOUS

## 2017-07-18 MED ORDER — SODIUM CHLORIDE 0.9 % IV SOLN
1.0000 g | Freq: Once | INTRAVENOUS | Status: AC
  Administered 2017-07-18: 1 g via INTRAVENOUS
  Filled 2017-07-18: qty 10

## 2017-07-18 MED ORDER — LABETALOL HCL 5 MG/ML IV SOLN
10.0000 mg | Freq: Once | INTRAVENOUS | Status: AC
  Administered 2017-07-18: 10 mg via INTRAVENOUS
  Filled 2017-07-18: qty 4

## 2017-07-18 MED ORDER — DIPHENHYDRAMINE HCL 50 MG/ML IJ SOLN
25.0000 mg | Freq: Once | INTRAMUSCULAR | Status: AC
  Administered 2017-07-18: 25 mg via INTRAVENOUS
  Filled 2017-07-18: qty 1

## 2017-07-18 MED ORDER — IOPAMIDOL (ISOVUE-370) INJECTION 76%
INTRAVENOUS | Status: AC
Start: 2017-07-18 — End: 2017-07-19
  Filled 2017-07-18: qty 100

## 2017-07-18 NOTE — ED Triage Notes (Signed)
Per EMS, pt from her mother's house. Pt reports around 0900 dry cough for which she took Nyquil for. When pt woke up, she had N/Vx3. Pt then reported heavy/aching pain from neck to chest radiating to both arms/hands. Pt reports taking Pepto, 324 ASA and 1 home nitro with some relief. EMS gave another nitro without more relief. Pt still reporting some gen weakness and 4/10 arm pain and 2/10 chest pain. Pt also reports HA last 2 days. Hx HTN. EMS VS BP 207/130, HR 95, R 22, SpO2 93% room air, CBG 130. 96% on 2LNC. Lungs clear per ems. Pt is A&O and ambulatory.

## 2017-07-18 NOTE — ED Notes (Signed)
ED Provider at bedside. 

## 2017-07-18 NOTE — ED Notes (Addendum)
Patient transported to X-ray 

## 2017-07-18 NOTE — H&P (Addendum)
History and Physical    Hayley Miller ZOX:[id number] DOB: 1960/12/08 DOA: 07/18/2017  Referring MD/NP/PA: Dr. Duffy Bruce PCP: Hoyt Koch, MD  Patient coming from: home   Chief Complaint: Chest pain  I have personally briefly reviewed patient's old medical records in [place]   HPI: Hayley Miller is a 57 y.o. female with medical history significant of HTN, HLD, MI, CAD s/p PCI, anxiety, anemia; who presents with compliants of chest pain. at around 9 AM she woke up with complaints of a dry cough for which she took NyQuil and took a nap.  However reports having, upon waking centralized chest pressure radiating into her neck  and down both arms.  Associated symptoms include complaints of nausea, 3 episodes of vomiting, diaphoresis, shortness of breath, and generalized weakness she tried taking Pepto, 324 mg of aspirin and 1 nitroglycerin at home with some mild relief.  Patient notes that over the last several days she is been having headaches for which she is been taking BC Goody Powders up to 3 times daily.  Denies having any significant focal deficits, change in vision, leg swelling,  ED Course: On admission to the emergency department patient was noted to be afebrile, pulse 72-97, respirations of 14-23, blood pressure up to 216/120, and O2 saturation maintained on room air.  Labs revealed normal CBC, CO2 17, glucose 62, troponin  0.11.  UDS was negative.  CT angiogram of the chest did not show any acute signs of a pulmonary embolus.  TRH called to admit.  Review of Systems  Constitutional: Positive for malaise/fatigue. Negative for weight loss.  HENT: Positive for congestion. Negative for ear discharge.   Eyes: Negative for double vision and photophobia.  Respiratory: Positive for cough and shortness of breath.   Cardiovascular: Positive for chest pain. Negative for leg swelling.  Gastrointestinal: Positive for nausea and vomiting. Negative for abdominal pain and diarrhea.    Genitourinary: Negative for dysuria and hematuria.  Musculoskeletal: Negative for falls and joint pain.  Skin: Negative for itching and rash.  Neurological: Positive for weakness and headaches. Negative for focal weakness and loss of consciousness.  Psychiatric/Behavioral: Negative for substance abuse and suicidal ideas.    Past Medical History:  Diagnosis Date  . Allergy   . Anemia   . Anxiety   . CAD (coronary artery disease)    a. s/p AL MI in 2006 tx with PTCA to LAD;  b. LHC (10/2009): lum irregs in LAD, o/w no CAD, EF 55-60%;  c. Myoview (07/2011):  no ischemia, EF 63%;  d. Echo (11/2011):  mild LVH, EF 60-65%, Gr 1 DD;  e.  Lexiscan Myoview (04/2013):  Inferior bowel atten artifact with inferoapical defect; no ischemia; EF 58% (low risk)  . Cameron lesion, acute   . Chronic pain syndrome   . Depression   . Erosive esophagitis   . Fibromyalgia   . GERD (gastroesophageal reflux disease)   . Hemorrhage    upper gastrointestional. GI bleed 10/09 due to NSAID  . Hiatal hernia   . HLD (hyperlipidemia)   . HTN (hypertension)   . Myocardial infarction ([place])    x2  . Rhinitis     Past Surgical History:  Procedure Laterality Date  . BLADDER SUSPENSION N/A 05/08/2013   Procedure: TRANSVAGINAL TAPE (TVT) PROCEDURE;  Surgeon: Olga Millers, MD;  Location: [facility];  Service: Gynecology;  Laterality: N/A;  . CARDIAC CATHETERIZATION    . CYSTOSCOPY N/A 05/08/2013   Procedure: CYSTOSCOPY;  Surgeon: Olga Millers, MD;  Location: [facility];  Service: Gynecology;  Laterality: N/A;  . L breast - cyst removed  2002  . LAPAROSCOPIC ASSISTED VAGINAL HYSTERECTOMY N/A 05/08/2013   Procedure: LAPAROSCOPIC ASSISTED VAGINAL HYSTERECTOMY;  Surgeon: Olga Millers, MD;  Location: [facility];  Service: Gynecology;  Laterality: N/A;  . LAPAROSCOPIC BILATERAL SALPINGO OOPHERECTOMY N/A 05/08/2013   Procedure: LAPAROSCOPIC BILATERAL SALPINGO OOPHORECTOMY;  Surgeon: Olga Millers, MD;  Location: [facility];   Service: Gynecology;  Laterality: N/A;  . UPPER GASTROINTESTINAL ENDOSCOPY       reports that she has been smoking.  She has never used smokeless tobacco. She reports that she drinks alcohol. She reports that she does not use drugs.  Allergies  Allergen Reactions  . Clonidine Hydrochloride Other (See Comments)    REACTION: FATIGUE  . Codeine Other (See Comments)    Unknown reaction (patient reports taking cough syrup with codeine)  . Ibuprofen Other (See Comments)    GI bleed  . Lovastatin Other (See Comments)    Join pain    Family History  Problem Relation Age of Onset  . Hypertension Mother   . Heart murmur Mother   . Colon polyps Mother   . Colon cancer Maternal Aunt   . Heart disease Son   . Heart disease Maternal Grandmother   . Diabetes Maternal Aunt   . Esophageal cancer Neg Hx   . Rectal cancer Neg Hx   . Stomach cancer Neg Hx     Prior to Admission medications   Medication Sig Start Date End Date Taking? Authorizing Provider  aspirin EC 81 MG tablet Take 81 mg by mouth daily.   Yes [provider]  Aspirin-Salicylamide-Caffeine (BC HEADACHE POWDER PO) Take 1 packet by mouth 3 (three) times daily as needed (headache).   Yes [provider]  Cod Liver Oil CAPS Take 1 capsule by mouth daily.   Yes [provider]  esomeprazole (NEXIUM) 40 MG capsule TAKE 1 CAPSULE AT 12:00 NOON EACH [place]. Patient taking differently: TAKE 1 CAPSULE BY MOUTH DAILY 05/18/17  Yes Hoyt Koch, MD  gabapentin (NEURONTIN) 300 MG capsule TAKE (1) CAPSULE THREE TIMES DAILY. Patient taking differently: TAKE (1) CAPSULE BY MOUTH THREE TIMES DAILY AS NEEDED FOR PAIN 03/01/17  Yes Hoyt Koch, MD  Ginkgo Biloba (GNP GINGKO BILOBA EXTRACT PO) Take 1 tablet by mouth daily.   Yes [provider]  losartan-hydrochlorothiazide (HYZAAR) 100-12.5 MG tablet Take 1 tablet by mouth daily. 08/30/16  Yes Fay Records, MD  MAGNESIUM PO Take 1 tablet by mouth  daily.   Yes [provider]  Menthol-Methyl Salicylate (MUSCLE RUB) 10-15 % CREA Apply 1 application topically daily as needed for muscle pain.   Yes [provider]  Multiple Vitamin (MULTIVITAMIN WITH MINERALS) TABS tablet Take 1 tablet by mouth daily.   Yes [provider]  nitroGLYCERIN (NITROSTAT) 0.4 MG SL tablet Place 1 tablet (0.4 mg total) under the tongue every 5 (five) minutes as needed for chest pain. 04/29/17  Yes Hoyt Koch, MD  pravastatin (PRAVACHOL) 40 MG tablet Take 1 tablet (40 mg total) by mouth at bedtime. Annual appt is due in Mus see provider for future refills 11/26/16  Yes Hoyt Koch, MD  Tetrahydrozoline HCl (VISINE OP) Place 1 drop into both eyes daily as needed (dry eyes).   Yes [provider]  cetirizine (ZYRTEC) 10 MG tablet Take 1 tablet (10 mg total) by mouth daily.  Patient not taking: Reported on 07/18/2017 04/29/17   Hoyt Koch, MD  desvenlafaxine (PRISTIQ) 50 MG 24 hr tablet Take 1 tablet (50 mg total) by mouth daily. Patient not taking: Reported on 12/28/2016 06/01/16   Hoyt Koch, MD  HYDROcodone-homatropine [facility]) 5-1.5 MG/5ML syrup Take 5 mLs by mouth every 8 (eight) hours as needed for cough. Patient not taking: Reported on 07/18/2017 04/29/17   Hoyt Koch, MD  levocetirizine (XYZAL) 5 MG tablet TAKE 1 TABLET ONCE DAILY IN THE EVENING. Patient not taking: Reported on 12/28/2016 07/05/16   Hoyt Koch, MD  ondansetron (ZOFRAN) 4 MG tablet Take 1 tablet (4 mg total) by mouth every 8 (eight) hours as needed for nausea or vomiting. Patient not taking: Reported on 07/18/2017 04/29/17   Hoyt Koch, MD    Physical Exam:  Constitutional: NAD, calm, comfortable Vitals:   07/18/17 2210 07/18/17 2245 07/18/17 2300 07/18/17 2315  BP: (!) 201/117 (!) 199/112 (!) 189/102 (!) 185/92  Pulse: 86 79 76 79  Resp: 14 19 18 18   Temp:      TempSrc:      SpO2: 97% 97% 98%  95%  Weight:      Height:       Eyes: PERRL, lids and conjunctivae normal ENMT: Mucous membranes are moist. Posterior pharynx clear of any exudate or lesions.  Neck: normal, supple, no masses, no thyromegaly Respiratory: clear to auscultation bilaterally, no wheezing, no crackles. Normal respiratory effort. No accessory muscle use.  Cardiovascular: Regular rate and rhythm, no murmurs / rubs / gallops. No extremity edema. 2+ pedal pulses. No carotid bruits.  Abdomen: no tenderness, no masses palpated. No hepatosplenomegaly. Bowel sounds positive.  Musculoskeletal: no clubbing / cyanosis. No joint deformity upper and lower extremities. Good ROM, no contractures. Normal muscle tone.  Skin: no rashes, lesions, ulcers. No induration Neurologic: CN 2-12 grossly intact. Sensation intact, DTR normal. Strength 5/5 in all 4.  Psychiatric: Normal judgment and insight. Alert and oriented x 3. Normal mood.     Labs on Admission: I have personally reviewed following labs and imaging studies  CBC: Recent Labs  Lab 07/18/17 2234  WBC 8.6  NEUTROABS 5.8  HGB 15.1*  HCT 44.8  MCV 94.1  PLT 811   Basic Metabolic Panel: Recent Labs  Lab 07/18/17 1937  NA 145  K 3.7  CL 122*  CO2 17*  GLUCOSE 62*  BUN 16  CREATININE 0.68  CALCIUM 5.7*   GFR: Estimated Creatinine Clearance: 79.7 mL/min (by C-G formula based on SCr of 0.68 mg/dL). Liver Function Tests: Recent Labs  Lab 07/18/17 1937  AST 45*  ALT 12*  ALKPHOS 34*  BILITOT 1.0  PROT 3.7*  ALBUMIN 2.2*   Recent Labs  Lab 07/18/17 1937  LIPASE 23   No results for input(s): AMMONIA in the last 168 hours. Coagulation Profile: No results for input(s): INR, PROTIME in the last 168 hours. Cardiac Enzymes: Recent Labs  Lab 07/18/17 1937  TROPONINI 0.11*   BNP (last 3 results) No results for input(s): PROBNP in the last 8760 hours. HbA1C: No results for input(s): HGBA1C in the last 72 hours. CBG: Recent Labs  Lab  07/18/17 2253  GLUCAP 117*   Lipid Profile: No results for input(s): CHOL, HDL, LDLCALC, TRIG, CHOLHDL, LDLDIRECT in the last 72 hours. Thyroid Function Tests: No results for input(s): TSH, T4TOTAL, FREET4, T3FREE, THYROIDAB in the last 72 hours. Anemia Panel: No results for input(s): VITAMINB12, FOLATE, FERRITIN, TIBC, IRON, RETICCTPCT  in the last 72 hours. Urine analysis:    Component Value Date/Time   COLORURINE YELLOW 09/03/2008 2133   APPEARANCEUR CLOUDY (A) 09/03/2008 2133   LABSPEC 1.024 09/03/2008 2133   PHURINE 7.5 09/03/2008 2133   GLUCOSEU NEGATIVE 09/03/2008 2133   GLUCOSEU NEGATIVE 07/03/2008 1527   HGBUR SMALL (A) 09/03/2008 2133   BILIRUBINUR NEGATIVE 09/03/2008 2133   KETONESUR 15 (A) 09/03/2008 2133   PROTEINUR NEGATIVE 09/03/2008 2133   UROBILINOGEN 0.2 09/03/2008 2133   NITRITE NEGATIVE 09/03/2008 2133   LEUKOCYTESUR NEGATIVE 09/03/2008 2133   Sepsis Labs: No results found for this or any previous visit (from the past 240 hour(s)).   Radiological Exams on Admission: Dg Chest 2 View  Result Date: 07/18/2017 CLINICAL DATA:  Chest pain since this morning EXAM: CHEST - 2 VIEW COMPARISON:  04/09/2012 FINDINGS: AP view of the chest. Borderline cardiomegaly with tortuous atherosclerotic aorta. No active pulmonary disease. No acute osseous abnormality. IMPRESSION: No active cardiopulmonary disease. Aortic atherosclerosis. Borderline cardiomegaly. Electronically Signed   By: Ashley Royalty M.D.   On: 07/18/2017 20:53   Ct Head Wo Contrast  Result Date: 07/18/2017 CLINICAL DATA:  Headache and weakness for 2 days. EXAM: CT HEAD WITHOUT CONTRAST TECHNIQUE: Contiguous axial images were obtained from the base of the skull through the vertex without intravenous contrast. COMPARISON:  10/16/2014 FINDINGS: Brain: No evidence of acute infarction, hemorrhage, hydrocephalus, extra-axial collection or mass lesion/mass effect. Vascular: No hyperdense vessel or unexpected calcification.  Skull: Normal. Negative for fracture or focal lesion. Sinuses/Orbits: Air-fluid level in the right maxillary sinus. Partial opacification of the ethmoid sinuses. Other: None. IMPRESSION: No acute intracranial abnormality. Right maxillary and ethmoid sinusitis. Electronically Signed   By: Fidela Salisbury M.D.   On: 07/18/2017 22:09   Ct Angio Chest/abd/pel For Dissection W And/or Wo Contrast  Result Date: 07/18/2017 CLINICAL DATA:  Chest pain radiating to the neck and arms EXAM: CT ANGIOGRAPHY CHEST, ABDOMEN AND PELVIS TECHNIQUE: Multidetector CT imaging through the chest, abdomen and pelvis was performed using the standard protocol during bolus administration of intravenous contrast. Multiplanar reconstructed images and MIPs were obtained and reviewed to evaluate the vascular anatomy. CONTRAST:  100 mL Isovue 370 intravenous COMPARISON:  Chest x-ray 07/18/2017 FINDINGS: CTA CHEST FINDINGS Cardiovascular: Non contrasted images of the chest demonstrate no intramural hematoma. Mild aortic atherosclerosis. No aneurysm. No dissection. Mild cardiomegaly. No significant pericardial effusion Mediastinum/Nodes: Midline trachea. Subcentimeter hypodense thyroid nodules. No significant mediastinal adenopathy. Small esophageal hiatal hernia. Lungs/Pleura: No acute consolidation or pleural effusion. 2 mm right middle lobe pulmonary nodule, series 9, image number 64. Musculoskeletal: No chest wall abnormality. No acute or significant osseous findings. Review of the MIP images confirms the above findings. CTA ABDOMEN AND PELVIS FINDINGS VASCULAR Aorta: Normal caliber aorta without aneurysm, dissection, vasculitis or significant stenosis. Celiac: Patent without evidence of aneurysm, dissection, vasculitis or significant stenosis. Left gastric artery appears to arise from the aorta, it is origin is not well visualized. SMA: Patent without evidence of aneurysm, dissection, vasculitis or significant stenosis. Renals: Single  left and 2 right renal arteries are patent. IMA: Patent without evidence of aneurysm, dissection, vasculitis or significant stenosis. Inflow: Patent without evidence of aneurysm, dissection, vasculitis or significant stenosis. Review of the MIP images confirms the above findings. NON-VASCULAR Hepatobiliary: No focal liver abnormality is seen. No gallstones, gallbladder wall thickening, or biliary dilatation. Pancreas: Unremarkable. No pancreatic ductal dilatation or surrounding inflammatory changes. Spleen: Normal in size without focal abnormality. Adrenals/Urinary Tract: Adrenal glands are unremarkable.  Kidneys are normal, without renal calculi, focal lesion, or hydronephrosis. Bladder is unremarkable. Stomach/Bowel: Stomach is within normal limits. Appendix appears normal. No evidence of bowel wall thickening, distention, or inflammatory changes. Sigmoid colon diverticula without acute inflammation Lymphatic: No significantly enlarged lymph nodes Reproductive: Status post hysterectomy. No adnexal masses. Other: Negative for free air or free fluid. Small fat in the umbilical region Musculoskeletal: No acute or significant osseous findings. Review of the MIP images confirms the above findings. IMPRESSION: 1. Negative for aortic aneurysm or dissection. 2. No significant vascular disease within the abdomen or pelvis. 3. 2 mm right middle lobe pulmonary nodule. No follow-up needed if patient is low-risk. Non-contrast chest CT can be considered in 12 months if patient is high-risk. This recommendation follows the consensus statement: Guidelines for Management of Incidental Pulmonary Nodules Detected on CT Images: From the Fleischner Society 2017; Radiology 2017; 284:228-243. 4. No CT evidence for acute intra-abdominal or pelvic abnormality 5. Sigmoid colon diverticula without acute inflammation Electronically Signed   By: Donavan Foil M.D.   On: 07/18/2017 22:28    EKG: Independently reviewed.  Sinus rhythm with  anterior Q waves and  new T wave flatting in the lateral leads  Assessment/Plan NSTEMI, CAD: Acute.  Patient presents with complaints of chest pain.  Initial troponin noted to be elevated at 0.11 on admission.  EKG with new changes, but no significant ST elevation.  Patient previously noted to have coronary artery disease requiring PCI. - Admit to stepdown bed - Trend Cardiac troponin  - Heparin drip per pharmacy - Check echocardiogram - Message sent for cardiology to eval  Hypertensive urgency: Acute.  Initial blood pressures elevated to 216/120.  - Continue losartan-hydrochlorothiazide - Hydralazine and labetalol IV prn  Hypocalcemia: Acute initial calcium noted to be 5.7 on admission.  Patient was given 1 g of calcium gluconate. - Give additional 1 g of calcium gluconate - Continue to monitor and replace as needed  Hypoglycemia: Acute.  Initial glucose noted to be 62 on initial lab work.  Patient not on any insulin or hypoglycemic agents.  Patient was given some ginger ale with improvement of blood glucose. - Hypoglycemic protocols - Continue to monitor blood glucose  Headaches: Acute.  Patient's headaches could be secondary to persistently elevated blood pressure and/or use of BC Goody powders - Fioricet x 1 dose   Hyperlipidemia - Continue pravstatin  DVT prophylaxis: heparin Code Status: Full Family Communication: none  Disposition Plan: TBD  Consults called: none  Admission status: observation  Norval Morton MD [facility] Pager [phone]   If 7PM-7AM, please contact night-coverage [email or web address] Password [facility]  07/18/2017, 11:57 PM

## 2017-07-18 NOTE — ED Notes (Signed)
Pt given gingerale, graham crackers and peanut butter for low glucose.

## 2017-07-18 NOTE — ED Notes (Signed)
Patient transported to CT 

## 2017-07-18 NOTE — ED Provider Notes (Signed)
[facility] CATH LAB Provider Note   CSN: [id number] Arrival date & time: 07/18/17  1828     History   Chief Complaint Chief Complaint  Patient presents with  . Chest Pain  . Hypertension    HPI Hayley Miller is a 57 y.o. female.  HPI   57 year old female with extensive past medical history as below including coronary disease, hypertension, esophagitis, here with chest pain.  The patient states she awoke in her usual state of health today.  She did have some mild cough and nausea throughout the [place] yesterday but this resolved when she went to bed.  She went and ate then laid back to go to sleep, when she developed nausea.  She then began vomiting.  She then developed a dry, nonproductive cough with associated dull, central, epigastric chest pressure.  She had associated radiation of this pressure to her bilateral shoulders down her arms.  She had some occasional intermittent numbness with this.  She had shortness of breath.  She has nausea.  She states her pain is currently 3 out of 10.  She took an aspirin as well as nitroglycerin, which did improve her pain at home.  Denies any alleviating factors.  No recent fevers or chills.  Of note, she does have known sick contacts with recent GI illnesses.  Past Medical History:  Diagnosis Date  . Allergy   . Anemia   . Anxiety   . CAD (coronary artery disease)    a. s/p AL MI in 2006 tx with PTCA to LAD;  b. LHC (10/2009): lum irregs in LAD, o/w no CAD, EF 55-60%;  c. Myoview (07/2011):  no ischemia, EF 63%;  d. Echo (11/2011):  mild LVH, EF 60-65%, Gr 1 DD;  e.  Lexiscan Myoview (04/2013):  Inferior bowel atten artifact with inferoapical defect; no ischemia; EF 58% (low risk)  . Jacynda Brunke lesion, acute   . Chronic pain syndrome   . Depression   . Erosive esophagitis   . Fibromyalgia   . GERD (gastroesophageal reflux disease)   . Hemorrhage    upper gastrointestional. GI bleed 10/09 due to NSAID  . Hiatal hernia   .  HLD (hyperlipidemia)   . HTN (hypertension)   . Myocardial infarction ([place])    x2  . Rhinitis     Patient Active Problem List   Diagnosis Date Noted  . Chest pain 07/19/2017  . Hypertensive urgency 07/19/2017  . Hypocalcemia 07/19/2017  . Hypoglycemia 07/19/2017  . Flu-like symptoms 04/29/2017  . Bleeding nose 04/29/2017  . Other forms of angina pectoris ([place]) 06/02/2016  . Acute sinusitis 10/03/2015  . Syncope 03/11/2014  . Tinnitus 03/11/2014  . Fibromyalgia 04/18/2009  . NSTEMI (non-ST elevated myocardial infarction) ([place]) 04/15/2009  . LOW BACK PAIN 01/12/2008  . Depression 07/27/2007  . Hyperlipidemia 06/27/2007  . Severe uncontrolled hypertension 06/27/2007  . CAD (coronary artery disease) 06/27/2007  . GERD 06/27/2007    Past Surgical History:  Procedure Laterality Date  . BLADDER SUSPENSION N/A 05/08/2013   Procedure: TRANSVAGINAL TAPE (TVT) PROCEDURE;  Surgeon: Olga Millers, MD;  Location: [facility];  Service: Gynecology;  Laterality: N/A;  . CARDIAC CATHETERIZATION    . CYSTOSCOPY N/A 05/08/2013   Procedure: CYSTOSCOPY;  Surgeon: Olga Millers, MD;  Location: [facility];  Service: Gynecology;  Laterality: N/A;  . L breast - cyst removed  2002  . LAPAROSCOPIC ASSISTED VAGINAL HYSTERECTOMY N/A 05/08/2013   Procedure: LAPAROSCOPIC ASSISTED VAGINAL HYSTERECTOMY;  Surgeon: Olga Millers, MD;  Location: [facility];  Service: Gynecology;  Laterality: N/A;  . LAPAROSCOPIC BILATERAL SALPINGO OOPHERECTOMY N/A 05/08/2013   Procedure: LAPAROSCOPIC BILATERAL SALPINGO OOPHORECTOMY;  Surgeon: Olga Millers, MD;  Location: [facility];  Service: Gynecology;  Laterality: N/A;  . UPPER GASTROINTESTINAL ENDOSCOPY       OB History   None      Home Medications    Prior to Admission medications   Medication Sig Start Date End Date Taking? Authorizing Provider  aspirin EC 81 MG tablet Take 81 mg by mouth daily.   Yes [provider]  Aspirin-Salicylamide-Caffeine (BC HEADACHE  POWDER PO) Take 1 packet by mouth 3 (three) times daily as needed (headache).   Yes [provider]  Cod Liver Oil CAPS Take 1 capsule by mouth daily.   Yes [provider]  esomeprazole (NEXIUM) 40 MG capsule TAKE 1 CAPSULE AT 12:00 NOON EACH [place]. Patient taking differently: TAKE 1 CAPSULE BY MOUTH DAILY 05/18/17  Yes Hoyt Koch, MD  gabapentin (NEURONTIN) 300 MG capsule TAKE (1) CAPSULE THREE TIMES DAILY. Patient taking differently: TAKE (1) CAPSULE BY MOUTH THREE TIMES DAILY AS NEEDED FOR PAIN 03/01/17  Yes Hoyt Koch, MD  Ginkgo Biloba (GNP GINGKO BILOBA EXTRACT PO) Take 1 tablet by mouth daily.   Yes [provider]  losartan-hydrochlorothiazide (HYZAAR) 100-12.5 MG tablet Take 1 tablet by mouth daily. 08/30/16  Yes Fay Records, MD  MAGNESIUM PO Take 1 tablet by mouth daily.   Yes [provider]  Menthol-Methyl Salicylate (MUSCLE RUB) 10-15 % CREA Apply 1 application topically daily as needed for muscle pain.   Yes [provider]  Multiple Vitamin (MULTIVITAMIN WITH MINERALS) TABS tablet Take 1 tablet by mouth daily.   Yes [provider]  nitroGLYCERIN (NITROSTAT) 0.4 MG SL tablet Place 1 tablet (0.4 mg total) under the tongue every 5 (five) minutes as needed for chest pain. 04/29/17  Yes Hoyt Koch, MD  pravastatin (PRAVACHOL) 40 MG tablet Take 1 tablet (40 mg total) by mouth at bedtime. Annual appt is due in Mus see provider for future refills 11/26/16  Yes Hoyt Koch, MD  Tetrahydrozoline HCl (VISINE OP) Place 1 drop into both eyes daily as needed (dry eyes).   Yes [provider]  cetirizine (ZYRTEC) 10 MG tablet Take 1 tablet (10 mg total) by mouth daily. Patient not taking: Reported on 07/18/2017 04/29/17   Hoyt Koch, MD  desvenlafaxine (PRISTIQ) 50 MG 24 hr tablet Take 1 tablet (50 mg total) by mouth daily. Patient not taking: Reported on 12/28/2016 06/01/16   Hoyt Koch, MD  HYDROcodone-homatropine [facility]) 5-1.5 MG/5ML syrup Take 5 mLs by mouth every 8 (eight) hours as needed for cough. Patient not taking: Reported on 07/18/2017 04/29/17   Hoyt Koch, MD  levocetirizine (XYZAL) 5 MG tablet TAKE 1 TABLET ONCE DAILY IN THE EVENING. Patient not taking: Reported on 12/28/2016 07/05/16   Hoyt Koch, MD  ondansetron (ZOFRAN) 4 MG tablet Take 1 tablet (4 mg total) by mouth every 8 (eight) hours as needed for nausea or vomiting. Patient not taking: Reported on 07/18/2017 04/29/17   Hoyt Koch, MD    Family History Family History  Problem Relation Age of Onset  . Hypertension Mother   . Heart murmur Mother   . Colon polyps Mother   . Colon cancer Maternal Aunt   . Heart disease Son   . Heart disease Maternal  Grandmother   . Diabetes Maternal Aunt   . Esophageal cancer Neg Hx   . Rectal cancer Neg Hx   . Stomach cancer Neg Hx     Social History Social History   Tobacco Use  . Smoking status: Current Every [place] Smoker  . Smokeless tobacco: Never Used  . Tobacco comment: Divorced, lives with female domestic partner  Substance Use Topics  . Alcohol use: Yes    Comment: occasional  . Drug use: No     Allergies   Clonidine hydrochloride; Codeine; Ibuprofen; and Lovastatin   Review of Systems Review of Systems  Constitutional: Positive for fatigue.  Respiratory: Positive for chest tightness and shortness of breath.   Cardiovascular: Positive for chest pain.  Gastrointestinal: Positive for nausea and vomiting.  All other systems reviewed and are negative.    Physical Exam Updated Vital Signs BP (!) 170/94   Pulse 85   Temp 98.8 F (37.1 C) (Oral)   Resp 16   Ht 5\' 4"  (1.626 m)   Wt 76.7 kg (169 lb)   LMP 06/07/2012   SpO2 97%   BMI 29.01 kg/m   Physical Exam  Constitutional: She is oriented to person, place, and time. She appears well-developed and well-nourished. No distress.  HENT:  Head:  Normocephalic and atraumatic.  Eyes: Conjunctivae are normal.  Neck: Neck supple.  Cardiovascular: Normal rate, regular rhythm and normal heart sounds. Exam reveals no friction rub.  No murmur heard. Pulmonary/Chest: Effort normal and breath sounds normal. No respiratory distress. She has no wheezes. She has no rales.  Abdominal: She exhibits no distension.  Musculoskeletal: She exhibits no edema.  Neurological: She is alert and oriented to person, place, and time. She exhibits normal muscle tone.  Skin: Skin is warm. Capillary refill takes less than 2 seconds.  Psychiatric: She has a normal mood and affect.  Nursing note and vitals reviewed.    ED Treatments / Results  Labs (all labs ordered are listed, but only abnormal results are displayed) Labs Reviewed  COMPREHENSIVE METABOLIC PANEL - Abnormal; Notable for the following components:      Result Value   Chloride 122 (*)    CO2 17 (*)    Glucose, Bld 62 (*)    Calcium 5.7 (*)    Total Protein 3.7 (*)    Albumin 2.2 (*)    AST 45 (*)    ALT 12 (*)    Alkaline Phosphatase 34 (*)    All other components within normal limits  TROPONIN I - Abnormal; Notable for the following components:   Troponin I 0.11 (*)    All other components within normal limits  CBC WITH DIFFERENTIAL/PLATELET - Abnormal; Notable for the following components:   Hemoglobin 15.1 (*)    All other components within normal limits  TROPONIN I - Abnormal; Notable for the following components:   Troponin I 1.03 (*)    All other components within normal limits  TROPONIN I - Abnormal; Notable for the following components:   Troponin I 0.91 (*)    All other components within normal limits  BASIC METABOLIC PANEL - Abnormal; Notable for the following components:   Sodium 134 (*)    Potassium 5.2 (*)    CO2 20 (*)    All other components within normal limits  LIPID PANEL - Abnormal; Notable for the following components:   Cholesterol 240 (*)    LDL  Cholesterol 170 (*)    All other components within normal limits  TROPONIN I - Abnormal; Notable for the following components:   Troponin I 0.74 (*)    All other components within normal limits  COMPREHENSIVE METABOLIC PANEL - Abnormal; Notable for the following components:   ALT 13 (*)    All other components within normal limits  I-STAT TROPONIN, ED - Abnormal; Notable for the following components:   Troponin i, poc 0.18 (*)    All other components within normal limits  CBG MONITORING, ED - Abnormal; Notable for the following components:   Glucose-Capillary 117 (*)    All other components within normal limits  LIPASE, BLOOD  RAPID URINE DRUG SCREEN, HOSP PERFORMED  BRAIN NATRIURETIC PEPTIDE  HIV ANTIBODY (ROUTINE TESTING)  CBC WITH DIFFERENTIAL/PLATELET  MAGNESIUM  SALICYLATE LEVEL  CBC WITH DIFFERENTIAL/PLATELET  CALCIUM, IONIZED  CBG MONITORING, ED  CBG MONITORING, ED  CBG MONITORING, ED    EKG EKG Interpretation  Date/Time:  Monday July 18 2017 18:49:13 EDT Ventricular Rate:  96 PR Interval:    QRS Duration: 96 QT Interval:  396 QTC Calculation: 501 R Axis:   -60 Text Interpretation:  Sinus rhythm Probable left atrial enlargement Left anterior fascicular block Left ventricular hypertrophy Anterior Q waves, possibly due to LVH Since last EKT, TWI/flattening in lateral leads is new Confirmed by Duffy Bruce [phone]) on 07/18/2017 6:52:17 PM   Radiology Dg Chest 2 View  Result Date: 07/18/2017 CLINICAL DATA:  Chest pain since this morning EXAM: CHEST - 2 VIEW COMPARISON:  04/09/2012 FINDINGS: AP view of the chest. Borderline cardiomegaly with tortuous atherosclerotic aorta. No active pulmonary disease. No acute osseous abnormality. IMPRESSION: No active cardiopulmonary disease. Aortic atherosclerosis. Borderline cardiomegaly. Electronically Signed   By: Ashley Royalty M.D.   On: 07/18/2017 20:53   Ct Head Wo Contrast  Result Date: 07/18/2017 CLINICAL DATA:  Headache  and weakness for 2 days. EXAM: CT HEAD WITHOUT CONTRAST TECHNIQUE: Contiguous axial images were obtained from the base of the skull through the vertex without intravenous contrast. COMPARISON:  10/16/2014 FINDINGS: Brain: No evidence of acute infarction, hemorrhage, hydrocephalus, extra-axial collection or mass lesion/mass effect. Vascular: No hyperdense vessel or unexpected calcification. Skull: Normal. Negative for fracture or focal lesion. Sinuses/Orbits: Air-fluid level in the right maxillary sinus. Partial opacification of the ethmoid sinuses. Other: None. IMPRESSION: No acute intracranial abnormality. Right maxillary and ethmoid sinusitis. Electronically Signed   By: Fidela Salisbury M.D.   On: 07/18/2017 22:09   Ct Angio Chest/abd/pel For Dissection W And/or Wo Contrast  Result Date: 07/18/2017 CLINICAL DATA:  Chest pain radiating to the neck and arms EXAM: CT ANGIOGRAPHY CHEST, ABDOMEN AND PELVIS TECHNIQUE: Multidetector CT imaging through the chest, abdomen and pelvis was performed using the standard protocol during bolus administration of intravenous contrast. Multiplanar reconstructed images and MIPs were obtained and reviewed to evaluate the vascular anatomy. CONTRAST:  100 mL Isovue 370 intravenous COMPARISON:  Chest x-ray 07/18/2017 FINDINGS: CTA CHEST FINDINGS Cardiovascular: Non contrasted images of the chest demonstrate no intramural hematoma. Mild aortic atherosclerosis. No aneurysm. No dissection. Mild cardiomegaly. No significant pericardial effusion Mediastinum/Nodes: Midline trachea. Subcentimeter hypodense thyroid nodules. No significant mediastinal adenopathy. Small esophageal hiatal hernia. Lungs/Pleura: No acute consolidation or pleural effusion. 2 mm right middle lobe pulmonary nodule, series 9, image number 64. Musculoskeletal: No chest wall abnormality. No acute or significant osseous findings. Review of the MIP images confirms the above findings. CTA ABDOMEN AND PELVIS FINDINGS  VASCULAR Aorta: Normal caliber aorta without aneurysm, dissection, vasculitis or significant stenosis. Celiac: Patent without evidence of  aneurysm, dissection, vasculitis or significant stenosis. Left gastric artery appears to arise from the aorta, it is origin is not well visualized. SMA: Patent without evidence of aneurysm, dissection, vasculitis or significant stenosis. Renals: Single left and 2 right renal arteries are patent. IMA: Patent without evidence of aneurysm, dissection, vasculitis or significant stenosis. Inflow: Patent without evidence of aneurysm, dissection, vasculitis or significant stenosis. Review of the MIP images confirms the above findings. NON-VASCULAR Hepatobiliary: No focal liver abnormality is seen. No gallstones, gallbladder wall thickening, or biliary dilatation. Pancreas: Unremarkable. No pancreatic ductal dilatation or surrounding inflammatory changes. Spleen: Normal in size without focal abnormality. Adrenals/Urinary Tract: Adrenal glands are unremarkable. Kidneys are normal, without renal calculi, focal lesion, or hydronephrosis. Bladder is unremarkable. Stomach/Bowel: Stomach is within normal limits. Appendix appears normal. No evidence of bowel wall thickening, distention, or inflammatory changes. Sigmoid colon diverticula without acute inflammation Lymphatic: No significantly enlarged lymph nodes Reproductive: Status post hysterectomy. No adnexal masses. Other: Negative for free air or free fluid. Small fat in the umbilical region Musculoskeletal: No acute or significant osseous findings. Review of the MIP images confirms the above findings. IMPRESSION: 1. Negative for aortic aneurysm or dissection. 2. No significant vascular disease within the abdomen or pelvis. 3. 2 mm right middle lobe pulmonary nodule. No follow-up needed if patient is low-risk. Non-contrast chest CT can be considered in 12 months if patient is high-risk. This recommendation follows the consensus statement:  Guidelines for Management of Incidental Pulmonary Nodules Detected on CT Images: From the Fleischner Society 2017; Radiology 2017; 284:228-243. 4. No CT evidence for acute intra-abdominal or pelvic abnormality 5. Sigmoid colon diverticula without acute inflammation Electronically Signed   By: Donavan Foil M.D.   On: 07/18/2017 22:28    Procedures .Critical Care Performed by: Duffy Bruce, MD Authorized by: Duffy Bruce, MD   Critical care provider statement:    Critical care time (minutes):  35   Critical care time was exclusive of:  Separately billable procedures and treating other patients and teaching time   Critical care was necessary to treat or prevent imminent or life-threatening deterioration of the following conditions:  Circulatory failure and cardiac failure   Critical care was time spent personally by me on the following activities:  Development of treatment plan with patient or surrogate, discussions with consultants, evaluation of patient's response to treatment, examination of patient, obtaining history from patient or surrogate, ordering and performing treatments and interventions, ordering and review of laboratory studies, ordering and review of radiographic studies, pulse oximetry, re-evaluation of patient's condition and review of old charts   I assumed direction of critical care for this patient from another provider in my specialty: no     (including critical care time)  Medications Ordered in ED Medications  nitroGLYCERIN (NITROSTAT) SL tablet 0.4 mg ( Sublingual MAR Hold 07/19/17 1330)  acetaminophen (TYLENOL) tablet 650 mg ( Oral MAR Hold 07/19/17 1330)  ondansetron (ZOFRAN) injection 4 mg ( Intravenous MAR Hold 07/19/17 1330)  gi cocktail (Maalox,Lidocaine,Donnatal) ( Oral MAR Hold 07/19/17 1330)  pantoprazole (PROTONIX) EC tablet 40 mg ( Oral Automatically Held 07/27/17 1000)  tetrahydrozoline 0.05 % ophthalmic solution 1 drop ( Both Eyes MAR Hold 07/19/17 1330)    pravastatin (PRAVACHOL) tablet 40 mg ( Oral Automatically Held 07/27/17 2200)  hydrALAZINE (APRESOLINE) injection 10 mg ( Intravenous MAR Hold 07/19/17 1330)  labetalol (NORMODYNE,TRANDATE) injection 10 mg ( Intravenous MAR Hold 07/19/17 1330)  heparin ADULT infusion 100 units/mL (25000 units/238mL sodium chloride 0.45%) (1,000 Units/hr Intravenous  New Bag/Given 07/19/17 0601)  sodium chloride flush (NS) 0.9 % injection 3 mL (has no administration in time range)  sodium chloride flush (NS) 0.9 % injection 3 mL (has no administration in time range)  0.9 %  sodium chloride infusion (has no administration in time range)  0.9 %  sodium chloride infusion (has no administration in time range)  morphine 4 MG/ML injection 2 mg ( Intravenous MAR Hold 07/19/17 1330)  losartan (COZAAR) tablet 100 mg ( Oral Automatically Held 07/27/17 1000)    And  hydrochlorothiazide (MICROZIDE) capsule 12.5 mg ( Oral Automatically Held 07/27/17 1000)  aspirin chewable tablet 81 mg (has no administration in time range)  midazolam (VERSED) injection (1 mg Intravenous Given 07/19/17 1352)  fentaNYL (SUBLIMAZE) injection (25 mcg Intravenous Given 07/19/17 1352)  heparin infusion 2 units/mL in 0.9 % sodium chloride (500 mLs Other New Bag/Given 07/19/17 1356)  lidocaine (PF) (XYLOCAINE) 1 % injection (2 mLs Intradermal Given 07/19/17 1357)  Radial Cocktail/Verapamil only (10 mLs Intra-arterial Given 07/19/17 1359)  heparin injection (4,000 Units Intravenous Given 07/19/17 1402)  iopamidol (ISOVUE-370) 76 % injection (75 mLs Intra-arterial Given 07/19/17 1419)  metoCLOPramide (REGLAN) injection 10 mg (10 mg Intravenous Given 07/18/17 1939)  diphenhydrAMINE (BENADRYL) injection 25 mg (25 mg Intravenous Given 07/18/17 1939)  gi cocktail (Maalox,Lidocaine,Donnatal) (30 mLs Oral Given 07/18/17 1938)  labetalol (NORMODYNE,TRANDATE) injection 10 mg (10 mg Intravenous Given 07/18/17 2112)  calcium gluconate 1 g in sodium chloride 0.9 % 100 mL IVPB (0  g Intravenous Stopped 07/18/17 2252)  iopamidol (ISOVUE-370) 76 % injection 100 mL (100 mLs Intravenous Contrast Given 07/18/17 2142)  labetalol (NORMODYNE,TRANDATE) injection 20 mg (20 mg Intravenous Given 07/18/17 2247)  calcium gluconate 1 g in sodium chloride 0.9 % 100 mL IVPB (0 g Intravenous Stopped 07/19/17 0414)  diphenhydrAMINE (BENADRYL) injection 25 mg (25 mg Intravenous Given 07/19/17 0429)  butalbital-acetaminophen-caffeine (FIORICET, ESGIC) 50-325-40 MG per tablet 1 tablet (1 tablet Oral Given 07/19/17 0457)  heparin bolus via infusion 4,000 Units (4,000 Units Intravenous Bolus from Bag 07/19/17 0605)     Initial Impression / Assessment and Plan / ED Course  I have reviewed the triage vital signs and the nursing notes.  Pertinent labs & imaging results that were available during my care of the patient were reviewed by me and considered in my medical decision making (see chart for details).  Clinical Course as of Jul 20 1419  Mon Jul 18, 1412  926 57 year old female here with atypical chest pain radiating to her shoulders with high blood pressure.  Concern for possible hypertensive urgency with possible ACS or ACS equivalent.  She has a history of MI in the past.  EKG without acute changes.  Must also consider possible GI source given her vomiting as well as no GI sick contacts.  Will treat with nitroglycerin, reassess, and send screening labs.   [CI]  2026 Troponin positive at 0.18.  EKG without ST elevation.  Given her multiple complaints including headache, bilateral arm pain, chest pain rating to her back, will send for dissection study.   [CI]    Clinical Course User Index [CI] Duffy Bruce, MD    CT neg. Pt with improvement in BP after IV antihypertensives. Admit to medicine for hTN urgency with demand ischemia.  Final Clinical Impressions(s) / ED Diagnoses   Final diagnoses:  Hypertensive urgency  Elevated troponin    ED Discharge Orders    None       Duffy Bruce, MD 07/19/17 1421

## 2017-07-19 ENCOUNTER — Observation Stay (HOSPITAL_COMMUNITY): Payer: Medicare Other

## 2017-07-19 ENCOUNTER — Encounter (HOSPITAL_COMMUNITY)
Admission: EM | Disposition: A | Discharge status: Home / Self Care | Payer: Self-pay | Source: Home / Self Care | Attending: Emergency Medicine

## 2017-07-19 ENCOUNTER — Encounter (HOSPITAL_COMMUNITY): Payer: Self-pay | Admitting: Cardiology

## 2017-07-19 DIAGNOSIS — I214 Non-ST elevation (NSTEMI) myocardial infarction: Secondary | ICD-10-CM | POA: Diagnosis not present

## 2017-07-19 DIAGNOSIS — I25110 Atherosclerotic heart disease of native coronary artery with unstable angina pectoris: Secondary | ICD-10-CM | POA: Diagnosis not present

## 2017-07-19 DIAGNOSIS — E162 Hypoglycemia, unspecified: Secondary | ICD-10-CM | POA: Diagnosis present

## 2017-07-19 DIAGNOSIS — I160 Hypertensive urgency: Secondary | ICD-10-CM

## 2017-07-19 DIAGNOSIS — R079 Chest pain, unspecified: Secondary | ICD-10-CM

## 2017-07-19 DIAGNOSIS — R51 Headache: Secondary | ICD-10-CM | POA: Diagnosis not present

## 2017-07-19 DIAGNOSIS — I10 Essential (primary) hypertension: Secondary | ICD-10-CM | POA: Diagnosis not present

## 2017-07-19 HISTORY — PX: CARDIAC CATHETERIZATION: SHX172

## 2017-07-19 HISTORY — PX: LEFT HEART CATH AND CORONARY ANGIOGRAPHY: CATH118249

## 2017-07-19 LAB — MAGNESIUM: MAGNESIUM: 1.8 mg/dL (ref 1.7–2.4)

## 2017-07-19 LAB — COMPREHENSIVE METABOLIC PANEL
ALT: 13 U/L — AB (ref 14–54)
AST: 37 U/L (ref 15–41)
Albumin: 3.9 g/dL (ref 3.5–5.0)
Alkaline Phosphatase: 55 U/L (ref 38–126)
Anion gap: 11 (ref 5–15)
BUN: 15 mg/dL (ref 6–20)
CHLORIDE: 104 mmol/L (ref 101–111)
CO2: 25 mmol/L (ref 22–32)
CREATININE: 0.96 mg/dL (ref 0.44–1.00)
Calcium: 10.1 mg/dL (ref 8.9–10.3)
GFR calc Af Amer: >60 mL/min (ref >60)
GLUCOSE: 95 mg/dL (ref 65–99)
Potassium: 4.1 mmol/L (ref 3.5–5.1)
Sodium: 140 mmol/L (ref 135–145)
Total Bilirubin: 0.9 mg/dL (ref 0.3–1.2)
Total Protein: 7 g/dL (ref 6.5–8.1)

## 2017-07-19 LAB — BASIC METABOLIC PANEL
ANION GAP: 13 (ref 5–15)
BUN: 14 mg/dL (ref 6–20)
CHLORIDE: 101 mmol/L (ref 101–111)
CO2: 20 mmol/L — AB (ref 22–32)
Calcium: 9.4 mg/dL (ref 8.9–10.3)
Creatinine, Ser: 0.94 mg/dL (ref 0.44–1.00)
GFR calc Af Amer: >60 mL/min (ref >60)
GFR calc non Af Amer: >60 mL/min (ref >60)
Glucose, Bld: 74 mg/dL (ref 65–99)
Potassium: 5.2 mmol/L — ABNORMAL HIGH (ref 3.5–5.1)
SODIUM: 134 mmol/L — AB (ref 135–145)

## 2017-07-19 LAB — CBC WITH DIFFERENTIAL/PLATELET
BASOS ABS: 0 10*3/uL (ref 0.0–0.1)
Basophils Relative: 0 %
Eosinophils Absolute: 0.2 10*3/uL (ref 0.0–0.7)
Eosinophils Relative: 2 %
HEMATOCRIT: 44.2 % (ref 36.0–46.0)
HEMOGLOBIN: 14.5 g/dL (ref 12.0–15.0)
LYMPHS PCT: 29 %
Lymphs Abs: 2.4 10*3/uL (ref 0.7–4.0)
MCH: 30.9 pg (ref 26.0–34.0)
MCHC: 32.8 g/dL (ref 30.0–36.0)
MCV: 94 fL (ref 78.0–100.0)
MONOS PCT: 5 %
Monocytes Absolute: 0.4 10*3/uL (ref 0.1–1.0)
NEUTROS PCT: 64 %
Neutro Abs: 5.1 10*3/uL (ref 1.7–7.7)
Platelets: 329 10*3/uL (ref 150–400)
RBC: 4.7 MIL/uL (ref 3.87–5.11)
RDW: 14 % (ref 11.5–15.5)
WBC: 8.1 10*3/uL (ref 4.0–10.5)

## 2017-07-19 LAB — LIPID PANEL
Cholesterol: 240 mg/dL — ABNORMAL HIGH (ref 0–200)
HDL: 49 mg/dL (ref >40)
LDL Cholesterol: 170 mg/dL — ABNORMAL HIGH (ref 0–99)
Total CHOL/HDL Ratio: 4.9 RATIO
Triglycerides: 105 mg/dL (ref <150)
VLDL: 21 mg/dL (ref 0–40)

## 2017-07-19 LAB — CBG MONITORING, ED
GLUCOSE-CAPILLARY: 91 mg/dL (ref 65–99)
GLUCOSE-CAPILLARY: 85 mg/dL (ref 65–99)
GLUCOSE-CAPILLARY: 84 mg/dL (ref 65–99)

## 2017-07-19 LAB — TROPONIN I
TROPONIN I: 0.91 ng/mL — AB (ref <0.03)
TROPONIN I: 0.74 ng/mL — AB (ref <0.03)
Troponin I: 1.03 ng/mL (ref <0.03)

## 2017-07-19 LAB — SALICYLATE LEVEL

## 2017-07-19 LAB — HIV ANTIBODY (ROUTINE TESTING W REFLEX): HIV Screen 4th Generation wRfx: NONREACTIVE

## 2017-07-19 SURGERY — LEFT HEART CATH AND CORONARY ANGIOGRAPHY
Anesthesia: LOCAL

## 2017-07-19 MED ORDER — SODIUM CHLORIDE 0.9% FLUSH: 3.0000 mL | INTRAVENOUS | Status: DC | PRN

## 2017-07-19 MED ORDER — FENTANYL CITRATE (PF) 100 MCG/2ML IJ SOLN
INTRAMUSCULAR | Status: DC | PRN
  Administered 2017-07-19: 25 ug via INTRAVENOUS

## 2017-07-19 MED ORDER — IOPAMIDOL (ISOVUE-370) INJECTION 76%
INTRAVENOUS | Status: AC
  Filled 2017-07-19: qty 100

## 2017-07-19 MED ORDER — MIDAZOLAM HCL 2 MG/2ML IJ SOLN
INTRAMUSCULAR | Status: DC | PRN
  Administered 2017-07-19: 1 mg via INTRAVENOUS

## 2017-07-19 MED ORDER — LIDOCAINE HCL (PF) 1 % IJ SOLN
INTRAMUSCULAR | Status: DC | PRN
  Administered 2017-07-19: 2 mL via INTRADERMAL

## 2017-07-19 MED ORDER — HYDROCHLOROTHIAZIDE 12.5 MG PO CAPS
12.5000 mg | ORAL_CAPSULE | Freq: Every day | ORAL | Status: DC
  Administered 2017-07-20 – 2017-07-21 (×2): 12.5 mg via ORAL
  Filled 2017-07-19 (×2): qty 1

## 2017-07-19 MED ORDER — NITROGLYCERIN 0.4 MG SL SUBL: 0.4000 mg | SUBLINGUAL_TABLET | SUBLINGUAL | Status: DC | PRN

## 2017-07-19 MED ORDER — SODIUM CHLORIDE 0.9% FLUSH
3.0000 mL | Freq: Two times a day (BID) | INTRAVENOUS | Status: DC
  Administered 2017-07-20 – 2017-07-21 (×4): 3 mL via INTRAVENOUS

## 2017-07-19 MED ORDER — HEPARIN (PORCINE) IN NACL 2-0.9 UNITS/ML
INTRAMUSCULAR | Status: AC | PRN
  Administered 2017-07-19 (×2): 500 mL

## 2017-07-19 MED ORDER — SODIUM CHLORIDE 0.9 % WEIGHT BASED INFUSION: 1.0000 mL/kg/h | INTRAVENOUS | Status: AC

## 2017-07-19 MED ORDER — ONDANSETRON HCL 4 MG/2ML IJ SOLN
4.0000 mg | Freq: Four times a day (QID) | INTRAMUSCULAR | Status: DC | PRN
  Administered 2017-07-19: 4 mg via INTRAVENOUS
  Filled 2017-07-19: qty 2

## 2017-07-19 MED ORDER — HYDRALAZINE HCL 20 MG/ML IJ SOLN
10.0000 mg | INTRAMUSCULAR | Status: DC | PRN
  Administered 2017-07-19 – 2017-07-20 (×3): 10 mg via INTRAVENOUS
  Filled 2017-07-19 (×3): qty 1

## 2017-07-19 MED ORDER — MORPHINE SULFATE (PF) 4 MG/ML IV SOLN
2.0000 mg | INTRAVENOUS | Status: DC | PRN
  Administered 2017-07-19 (×2): 2 mg via INTRAVENOUS
  Filled 2017-07-19: qty 1

## 2017-07-19 MED ORDER — PANTOPRAZOLE SODIUM 40 MG PO TBEC
40.0000 mg | DELAYED_RELEASE_TABLET | Freq: Every day | ORAL | Status: DC
  Administered 2017-07-20 – 2017-07-21 (×2): 40 mg via ORAL
  Filled 2017-07-19 (×2): qty 1

## 2017-07-19 MED ORDER — MIDAZOLAM HCL 2 MG/2ML IJ SOLN
INTRAMUSCULAR | Status: AC
Start: 2017-07-19 — End: 2017-07-19
  Filled 2017-07-19: qty 2

## 2017-07-19 MED ORDER — IOPAMIDOL (ISOVUE-370) INJECTION 76%
INTRAVENOUS | Status: DC | PRN
  Administered 2017-07-19: 75 mL via INTRA_ARTERIAL

## 2017-07-19 MED ORDER — ACETAMINOPHEN 325 MG PO TABS
650.0000 mg | ORAL_TABLET | ORAL | Status: DC | PRN
  Administered 2017-07-19 – 2017-07-20 (×4): 650 mg via ORAL
  Filled 2017-07-19 (×4): qty 2

## 2017-07-19 MED ORDER — BUTALBITAL-APAP-CAFFEINE 50-325-40 MG PO TABS
1.0000 | ORAL_TABLET | Freq: Four times a day (QID) | ORAL | Status: DC | PRN
  Administered 2017-07-19 – 2017-07-20 (×3): 1 via ORAL
  Filled 2017-07-19 (×3): qty 1

## 2017-07-19 MED ORDER — HEPARIN (PORCINE) IN NACL 100-0.45 UNIT/ML-% IJ SOLN
1000.0000 [IU]/h | INTRAMUSCULAR | Status: DC
  Administered 2017-07-19: 1000 [IU]/h via INTRAVENOUS
  Filled 2017-07-19: qty 250

## 2017-07-19 MED ORDER — HEPARIN SODIUM (PORCINE) 1000 UNIT/ML IJ SOLN
INTRAMUSCULAR | Status: DC | PRN
  Administered 2017-07-19: 4000 [IU] via INTRAVENOUS

## 2017-07-19 MED ORDER — LIDOCAINE HCL (PF) 1 % IJ SOLN
INTRAMUSCULAR | Status: AC
  Filled 2017-07-19: qty 30

## 2017-07-19 MED ORDER — MORPHINE SULFATE (PF) 4 MG/ML IV SOLN
INTRAVENOUS | Status: AC
  Filled 2017-07-19: qty 1

## 2017-07-19 MED ORDER — TETRAHYDROZOLINE HCL 0.05 % OP SOLN: 1.0000 [drp] | Freq: Every day | OPHTHALMIC | Status: DC | PRN

## 2017-07-19 MED ORDER — LABETALOL HCL 5 MG/ML IV SOLN
10.0000 mg | INTRAVENOUS | Status: DC | PRN
  Administered 2017-07-19 – 2017-07-20 (×4): 10 mg via INTRAVENOUS
  Filled 2017-07-19 (×4): qty 4

## 2017-07-19 MED ORDER — BUTALBITAL-APAP-CAFFEINE 50-325-40 MG PO TABS
1.0000 | ORAL_TABLET | ORAL | Status: AC
  Administered 2017-07-19: 1 via ORAL
  Filled 2017-07-19: qty 1

## 2017-07-19 MED ORDER — GI COCKTAIL ~~LOC~~
30.0000 mL | Freq: Four times a day (QID) | ORAL | Status: DC | PRN
  Administered 2017-07-19: 30 mL via ORAL
  Filled 2017-07-19: qty 30

## 2017-07-19 MED ORDER — LOSARTAN POTASSIUM 50 MG PO TABS
100.0000 mg | ORAL_TABLET | Freq: Every day | ORAL | Status: DC
  Administered 2017-07-20 – 2017-07-21 (×2): 100 mg via ORAL
  Filled 2017-07-19 (×3): qty 2

## 2017-07-19 MED ORDER — VERAPAMIL HCL 2.5 MG/ML IV SOLN
INTRAVENOUS | Status: DC | PRN
  Administered 2017-07-19: 10 mL via INTRA_ARTERIAL

## 2017-07-19 MED ORDER — ENOXAPARIN SODIUM 40 MG/0.4ML ~~LOC~~ SOLN
40.0000 mg | SUBCUTANEOUS | Status: DC
  Administered 2017-07-19: 40 mg via SUBCUTANEOUS
  Filled 2017-07-19: qty 0.4

## 2017-07-19 MED ORDER — SODIUM CHLORIDE 0.9 % IV SOLN
1.0000 g | Freq: Once | INTRAVENOUS | Status: AC
  Administered 2017-07-19: 1 g via INTRAVENOUS
  Filled 2017-07-19: qty 10

## 2017-07-19 MED ORDER — SODIUM CHLORIDE 0.9% FLUSH: 3.0000 mL | Freq: Two times a day (BID) | INTRAVENOUS | Status: DC

## 2017-07-19 MED ORDER — FENTANYL CITRATE (PF) 100 MCG/2ML IJ SOLN
INTRAMUSCULAR | Status: AC
  Filled 2017-07-19: qty 2

## 2017-07-19 MED ORDER — LOSARTAN POTASSIUM-HCTZ 100-12.5 MG PO TABS: 1.0000 | ORAL_TABLET | Freq: Every day | ORAL | Status: DC

## 2017-07-19 MED ORDER — SODIUM CHLORIDE 0.9 % IV SOLN: INTRAVENOUS | Status: DC

## 2017-07-19 MED ORDER — PRAVASTATIN SODIUM 40 MG PO TABS
40.0000 mg | ORAL_TABLET | Freq: Every day | ORAL | Status: DC
  Administered 2017-07-19 – 2017-07-20 (×2): 40 mg via ORAL
  Filled 2017-07-19 (×2): qty 1

## 2017-07-19 MED ORDER — HEPARIN SODIUM (PORCINE) 1000 UNIT/ML IJ SOLN
INTRAMUSCULAR | Status: AC
  Filled 2017-07-19: qty 1

## 2017-07-19 MED ORDER — ASPIRIN 81 MG PO CHEW: 81.0000 mg | CHEWABLE_TABLET | ORAL | Status: DC

## 2017-07-19 MED ORDER — SODIUM CHLORIDE 0.9 % IV SOLN
250.0000 mL | INTRAVENOUS | Status: DC | PRN
Start: 2017-07-20 — End: 2017-07-21

## 2017-07-19 MED ORDER — HEPARIN BOLUS VIA INFUSION
4000.0000 [IU] | Freq: Once | INTRAVENOUS | Status: AC
  Administered 2017-07-19: 4000 [IU] via INTRAVENOUS
  Filled 2017-07-19: qty 4000

## 2017-07-19 MED ORDER — DIPHENHYDRAMINE HCL 50 MG/ML IJ SOLN
25.0000 mg | Freq: Once | INTRAMUSCULAR | Status: AC
  Administered 2017-07-19: 25 mg via INTRAVENOUS
  Filled 2017-07-19: qty 1

## 2017-07-19 MED ORDER — SODIUM CHLORIDE 0.9 % IV SOLN: 250.0000 mL | INTRAVENOUS | Status: DC | PRN

## 2017-07-19 MED ORDER — HEPARIN SODIUM (PORCINE) 5000 UNIT/ML IJ SOLN
5000.0000 [IU] | Freq: Three times a day (TID) | INTRAMUSCULAR | Status: DC
  Administered 2017-07-20 – 2017-07-21 (×3): 5000 [IU] via SUBCUTANEOUS
  Filled 2017-07-19 (×3): qty 1

## 2017-07-19 MED ORDER — HEPARIN (PORCINE) IN NACL 1000-0.9 UT/500ML-% IV SOLN
INTRAVENOUS | Status: AC
  Filled 2017-07-19: qty 1000

## 2017-07-19 MED ORDER — VERAPAMIL HCL 2.5 MG/ML IV SOLN
INTRAVENOUS | Status: AC
  Filled 2017-07-19: qty 2

## 2017-07-19 MED ORDER — HYDRALAZINE HCL 20 MG/ML IJ SOLN
INTRAMUSCULAR | Status: AC
  Filled 2017-07-19: qty 1

## 2017-07-19 SURGICAL SUPPLY — 16 items
BAND CMPR LRG ZPHR (HEMOSTASIS) ×1
BAND ZEPHYR COMPRESS 30 LONG (HEMOSTASIS) ×2 IMPLANT
CATH INFINITI 5 FR JL3.5 (CATHETERS) ×2 IMPLANT
CATH INFINITI 5FR ANG PIGTAIL (CATHETERS) ×2 IMPLANT
CATH INFINITI JR4 5F (CATHETERS) ×2 IMPLANT
COVER PRB 48X5XTLSCP FOLD TPE (BAG) ×1 IMPLANT
COVER PROBE 5X48 (BAG) ×2
GUIDEWIRE INQWIRE 1.5J.035X260 (WIRE) ×1 IMPLANT
INQWIRE 1.5J .035X260CM (WIRE) ×2
KIT HEART LEFT (KITS) ×2 IMPLANT
NEEDLE PERC 21GX4CM (NEEDLE) ×2 IMPLANT
PACK CARDIAC CATHETERIZATION (CUSTOM PROCEDURE TRAY) ×2 IMPLANT
SHEATH RAIN RADIAL 21G 6FR (SHEATH) ×2 IMPLANT
SYR MEDRAD MARK V 150ML (SYRINGE) ×2 IMPLANT
TRANSDUCER W/STOPCOCK (MISCELLANEOUS) ×2 IMPLANT
TUBING CIL FLEX 10 FLL-RA (TUBING) ×2 IMPLANT

## 2017-07-19 NOTE — ED Notes (Signed)
Admitting MD Tamala Julian at bedside.

## 2017-07-19 NOTE — ED Notes (Signed)
Cards at bedside to evaluate

## 2017-07-19 NOTE — Progress Notes (Signed)
ANTICOAGULATION CONSULT NOTE - Initial Consult  Pharmacy Consult for heparin Indication: chest pain/ACS  Allergies  Allergen Reactions  . Clonidine Hydrochloride Other (See Comments)    REACTION: FATIGUE  . Codeine Other (See Comments)    Unknown reaction (patient reports taking cough syrup with codeine)  . Ibuprofen Other (See Comments)    GI bleed  . Lovastatin Other (See Comments)    Join pain    Patient Measurements: Height: 5\' 4"  (162.6 cm) Weight: 169 lb (76.7 kg) IBW/kg (Calculated) : 54.7 Heparin Dosing Weight: 70.9 kg  Vital Signs: Temp: 98.8 F (37.1 C) (04/22 1853) Temp Source: Oral (04/22 1853) BP: 216/122 (04/23 0500) Pulse Rate: 84 (04/23 0500)  Labs: Recent Labs    07/18/17 1937 07/18/17 2234 07/19/17 0247  HGB  --  15.1*  --   HCT  --  44.8  --   PLT  --  367  --   CREATININE 0.68  --   --   TROPONINI 0.11*  --  1.03*    Estimated Creatinine Clearance: 79.7 mL/min (by C-G formula based on SCr of 0.68 mg/dL).   Medical History: Past Medical History:  Diagnosis Date  . Allergy   . Anemia   . Anxiety   . CAD (coronary artery disease)    a. s/p AL MI in 2006 tx with PTCA to LAD;  b. LHC (10/2009): lum irregs in LAD, o/w no CAD, EF 55-60%;  c. Myoview (07/2011):  no ischemia, EF 63%;  d. Echo (11/2011):  mild LVH, EF 60-65%, Gr 1 DD;  e.  Lexiscan Myoview (04/2013):  Inferior bowel atten artifact with inferoapical defect; no ischemia; EF 58% (low risk)  . [place] lesion, acute   . Chronic pain syndrome   . Depression   . Erosive esophagitis   . Fibromyalgia   . GERD (gastroesophageal reflux disease)   . Hemorrhage    upper gastrointestional. GI bleed 10/09 due to NSAID  . Hiatal hernia   . HLD (hyperlipidemia)   . HTN (hypertension)   . Myocardial infarction ([place])    x2  . Rhinitis     Medications:  See medication history  Assessment: 57 yo lady to start heparin for CP.  She was not on anticoagulation PTA Goal of Therapy:  Heparin  level 0.3-0.7 units/ml Monitor platelets by anticoagulation protocol: Yes   Plan:  Heparin 4000 unit bolus and drip at 1000 units/hr Check heparin level 6 hours after start and daily while on heparin Monitor for bleeding complications  Thanks for allowing pharmacy to be a part of this patient's care.  Excell Seltzer, PharmD Clinical Pharmacist 07/19/2017,5:28 AM

## 2017-07-19 NOTE — Progress Notes (Signed)
Attempted echo. Unable to perform due to patient in cath lab. Will attempt once patient returns to room.

## 2017-07-19 NOTE — ED Notes (Signed)
Dr. Claiborne Billings to come see patient; no new orders at this time.

## 2017-07-19 NOTE — Progress Notes (Signed)
Pt wrist was level zero when I made my first rounds but she vomited and was squeezing the side rail while vomiting and afterwards there was bruising and scant amount of bleeding. I reminded her not to use her wrist, notified the doctor and re-applied the TR band.

## 2017-07-19 NOTE — Progress Notes (Signed)
Rec'd pt from cath lab drowsey but responsive. Right radial TR band in place with no bleeding noted.  Patient offers no c/o pain presently.  Will continue to observe.

## 2017-07-19 NOTE — ED Notes (Signed)
Dr Kelly at bedside

## 2017-07-19 NOTE — Consult Note (Addendum)
Cardiology Consultation:   Patient ID: Hayley Miller; [id number]; 17-Mar-1961   Admit date: 07/18/2017 Date of Consult: 07/19/2017  Primary Care Provider: Hoyt Koch, MD Primary Cardiologist: Dorris Carnes, MD  Primary Electrophysiologist: NA   Patient Profile:   Hayley Miller is a 57 y.o. female with a hx of HTN, CAD with MI in 2006 and PTCA to LAD, last cath 2011 stable, neg nuc 2015 who is being seen today for the evaluation of chest pain and elevated troponin at the request of Dr. Bonner Puna.  History of Present Illness:   Hayley Miller has a hx of HTN, CAD with MI in 2006 and PTCA to LAD, last cath 2011 stable, neg nuc 2015, HLD on statin.    Pt presented to ER last evening with N/V and heavy aching pain from neck to chest with radiation to both arms and hands.  This was after nyguil she took for cough.  She did take ASA and NTG with some relief.  Her BP was significantly elevated at 207/130.    Also with H/A.  She has rec'd benadryl, GI cocktail, Fioricet, labetalol  And BP now 134/84, had brief episode of 77/48 around 9 am with drop in pulse to 47.  She also had sl Lt face droop with this.  Resolved quickly.  Also had mouth numbness.    EKG:  The EKG was personally reviewed and demonstrates:  SR with mild T wave inversions lat, new from 04/2016.  Now much deeper T wave inversions in lat leads.   Telemetry:  Telemetry was personally reviewed and demonstrates:  SR except for episode of SB.   Troponin poc 0.18;  Troponin 0.11;1.03' 0.91  BNP 62 Na 145, K+ 3.7, BUN 16, Cr 0.68  AST 45, ALT 12  WBC 8.6 HGB 15.1 Drug screen neg.   CT Head:  No acute intracranial abnormality  CTA of chest : IMPRESSION: 1. Negative for aortic aneurysm or dissection. 2. No significant vascular disease within the abdomen or pelvis. 3. 2 mm right middle lobe pulmonary nodule. No follow-up needed if patient is low-risk. Non-contrast chest CT can be considered in 12 months if patient is high-risk. This  recommendation follows the consensus statement: Guidelines for Management of Incidental Pulmonary Nodules Detected on CT Images: From the Fleischner Society 2017; Radiology 2017; 284:228-243. 4. No CT evidence for acute intra-abdominal or pelvic abnormality 5. Sigmoid colon diverticula without acute inflammation  Currently no chest pain some Rt arm pain.  No nausea.  No residual symptoms of mouth being numb.   Past Medical History:  Diagnosis Date  . Allergy   . Anemia   . Anxiety   . CAD (coronary artery disease)    a. s/p AL MI in 2006 tx with PTCA to LAD;  b. LHC (10/2009): lum irregs in LAD, o/w no CAD, EF 55-60%;  c. Myoview (07/2011):  no ischemia, EF 63%;  d. Echo (11/2011):  mild LVH, EF 60-65%, Gr 1 DD;  e.  Lexiscan Myoview (04/2013):  Inferior bowel atten artifact with inferoapical defect; no ischemia; EF 58% (low risk)  . Cameron lesion, acute   . Chronic pain syndrome   . Depression   . Erosive esophagitis   . Fibromyalgia   . GERD (gastroesophageal reflux disease)   . Hemorrhage    upper gastrointestional. GI bleed 10/09 due to NSAID  . Hiatal hernia   . HLD (hyperlipidemia)   . HTN (hypertension)   . Myocardial infarction ([place])    x2  .  Rhinitis     Past Surgical History:  Procedure Laterality Date  . BLADDER SUSPENSION N/A 05/08/2013   Procedure: TRANSVAGINAL TAPE (TVT) PROCEDURE;  Surgeon: Olga Millers, MD;  Location: [facility];  Service: Gynecology;  Laterality: N/A;  . CARDIAC CATHETERIZATION    . CYSTOSCOPY N/A 05/08/2013   Procedure: CYSTOSCOPY;  Surgeon: Olga Millers, MD;  Location: [facility];  Service: Gynecology;  Laterality: N/A;  . L breast - cyst removed  2002  . LAPAROSCOPIC ASSISTED VAGINAL HYSTERECTOMY N/A 05/08/2013   Procedure: LAPAROSCOPIC ASSISTED VAGINAL HYSTERECTOMY;  Surgeon: Olga Millers, MD;  Location: [facility];  Service: Gynecology;  Laterality: N/A;  . LAPAROSCOPIC BILATERAL SALPINGO OOPHERECTOMY N/A 05/08/2013   Procedure: LAPAROSCOPIC  BILATERAL SALPINGO OOPHORECTOMY;  Surgeon: Olga Millers, MD;  Location: [facility];  Service: Gynecology;  Laterality: N/A;  . UPPER GASTROINTESTINAL ENDOSCOPY       Home Medications:  Prior to Admission medications   Medication Sig Start Date End Date Taking? Authorizing Provider  aspirin EC 81 MG tablet Take 81 mg by mouth daily.   Yes [provider]  Aspirin-Salicylamide-Caffeine (BC HEADACHE POWDER PO) Take 1 packet by mouth 3 (three) times daily as needed (headache).   Yes [provider]  Cod Liver Oil CAPS Take 1 capsule by mouth daily.   Yes [provider]  esomeprazole (NEXIUM) 40 MG capsule TAKE 1 CAPSULE AT 12:00 NOON EACH [place]. Patient taking differently: TAKE 1 CAPSULE BY MOUTH DAILY 05/18/17  Yes Hoyt Koch, MD  gabapentin (NEURONTIN) 300 MG capsule TAKE (1) CAPSULE THREE TIMES DAILY. Patient taking differently: TAKE (1) CAPSULE BY MOUTH THREE TIMES DAILY AS NEEDED FOR PAIN 03/01/17  Yes Hoyt Koch, MD  Ginkgo Biloba (GNP GINGKO BILOBA EXTRACT PO) Take 1 tablet by mouth daily.   Yes [provider]  losartan-hydrochlorothiazide (HYZAAR) 100-12.5 MG tablet Take 1 tablet by mouth daily. 08/30/16  Yes Fay Records, MD  MAGNESIUM PO Take 1 tablet by mouth daily.   Yes [provider]  Menthol-Methyl Salicylate (MUSCLE RUB) 10-15 % CREA Apply 1 application topically daily as needed for muscle pain.   Yes [provider]  Multiple Vitamin (MULTIVITAMIN WITH MINERALS) TABS tablet Take 1 tablet by mouth daily.   Yes [provider]  nitroGLYCERIN (NITROSTAT) 0.4 MG SL tablet Place 1 tablet (0.4 mg total) under the tongue every 5 (five) minutes as needed for chest pain. 04/29/17  Yes Hoyt Koch, MD  pravastatin (PRAVACHOL) 40 MG tablet Take 1 tablet (40 mg total) by mouth at bedtime. Annual appt is due in Mus see provider for future refills 11/26/16  Yes Hoyt Koch, MD  Tetrahydrozoline HCl  (VISINE OP) Place 1 drop into both eyes daily as needed (dry eyes).   Yes [provider]  cetirizine (ZYRTEC) 10 MG tablet Take 1 tablet (10 mg total) by mouth daily. Patient not taking: Reported on 07/18/2017 04/29/17   Hoyt Koch, MD  desvenlafaxine (PRISTIQ) 50 MG 24 hr tablet Take 1 tablet (50 mg total) by mouth daily. Patient not taking: Reported on 12/28/2016 06/01/16   Hoyt Koch, MD  HYDROcodone-homatropine [facility]) 5-1.5 MG/5ML syrup Take 5 mLs by mouth every 8 (eight) hours as needed for cough. Patient not taking: Reported on 07/18/2017 04/29/17   Hoyt Koch, MD  levocetirizine (XYZAL) 5 MG tablet TAKE 1 TABLET ONCE DAILY IN THE EVENING. Patient not taking: Reported on 12/28/2016 07/05/16   Hoyt Koch, MD  ondansetron (ZOFRAN) 4 MG tablet Take 1 tablet (4 mg total) by mouth every 8 (eight) hours as needed for nausea or vomiting. Patient not taking: Reported on 07/18/2017 04/29/17   Hoyt Koch, MD    Inpatient Medications: Scheduled Meds: . losartan-hydrochlorothiazide  1 tablet Oral Daily  . pantoprazole  40 mg Oral Daily  . pravastatin  40 mg Oral QHS   Continuous Infusions: . heparin 1,000 Units/hr (07/19/17 0601)   PRN Meds: acetaminophen, gi cocktail, hydrALAZINE, labetalol, nitroGLYCERIN, ondansetron (ZOFRAN) IV, tetrahydrozoline  Allergies:    Allergies  Allergen Reactions  . Clonidine Hydrochloride Other (See Comments)    REACTION: FATIGUE  . Codeine Other (See Comments)    Unknown reaction (patient reports taking cough syrup with codeine)  . Ibuprofen Other (See Comments)    GI bleed  . Lovastatin Other (See Comments)    Join pain    Social History:   Social History   Socioeconomic History  . Marital status: Single    Spouse name: Not on file  . Number of children: 1  . Years of education: Not on file  . Highest education level: Not on file  Occupational History  . Occupation: unemployed    Fish farm manager:  UNEMPLOYED  Social Needs  . Financial resource strain: Not on file  . Food insecurity:    Worry: Not on file    Inability: Not on file  . Transportation needs:    Medical: Not on file    Non-medical: Not on file  Tobacco Use  . Smoking status: Current Every [place] Smoker  . Smokeless tobacco: Never Used  . Tobacco comment: Divorced, lives with female domestic partner  Substance and Sexual Activity  . Alcohol use: Yes    Comment: occasional  . Drug use: No  . Sexual activity: Yes    Birth control/protection: Other-see comments    Comment: has a female domestic partner  Lifestyle  . Physical activity:    Days per week: Not on file    Minutes per session: Not on file  . Stress: Not on file  Relationships  . Social connections:    Talks on phone: Not on file    Gets together: Not on file    Attends religious service: Not on file    Active member of club or organization: Not on file    Attends meetings of clubs or organizations: Not on file    Relationship status: Not on file  . Intimate partner violence:    Fear of current or ex partner: Not on file    Emotionally abused: Not on file    Physically abused: Not on file    Forced sexual activity: Not on file  Other Topics Concern  . Not on file  Social History Narrative   Lives alone - divorced; 1 child.    Used to live with female domestic partner   Unemployed - disability.           Family History:    Family History  Problem Relation Age of Onset  . Hypertension Mother   . Heart murmur Mother   . Colon polyps Mother   . Colon cancer Maternal Aunt   . Heart disease Son   . Heart disease Maternal Grandmother   . Diabetes Maternal Aunt   . Esophageal cancer Neg Hx   . Rectal cancer Neg Hx   . Stomach cancer Neg Hx      ROS:  Please see the history of present illness.  General:no colds or fevers, no weight changes Skin:no rashes or ulcers HEENT:no blurred vision, no congestion CV:see HPI PUL:see HPI GI:no  diarrhea constipation or melena, no indigestion GU:no hematuria, no dysuria MS:no joint pain, no claudication Neuro:no syncope, no lightheadedness Endo:no diabetes, no thyroid disease GYN:  Hx of hysterectomy   All other ROS reviewed and negative.     Physical Exam/Data:   Vitals:   07/19/17 0815 07/19/17 0830 07/19/17 0845 07/19/17 1000  BP: (!) 194/101 (!) 193/101 (!) 190/122 (!) 77/48  Pulse: 81 71 86 (!) 47  Resp: 14 15 (!) 24 13  Temp:      TempSrc:      SpO2: 99% 97% 91% 94%  Weight:      Height:        Intake/Output Summary (Last 24 hours) at 07/19/2017 1006 Last data filed at 07/19/2017 0414 Gross per 24 hour  Intake 220 ml  Output -  Net 220 ml   Filed Weights   07/18/17 1944  Weight: 169 lb (76.7 kg)   Body mass index is 29.01 kg/m.  General:  Well nourished, well developed, in no acute distress, but anxious HEENT: normal Lymph: no adenopathy Neck: no JVD Endocrine:  No thryomegaly Vascular: No carotid bruits; pedal pulses 2+ bilaterally  Cardiac:  normal S1, S2; RRR; no murmur, gallup rub or click Lungs:  clear to auscultation bilaterally, no wheezing, rhonchi or rales  Abd: soft, nontender, no hepatomegaly  Ext: no edema Musculoskeletal:  No deformities, BUE and BLE strength normal and equal Skin: warm and dry  Neuro:  CNs 2-12 intact, no focal abnormalities noted Psych:  Normal affect    Relevant CV Studies: Nuc study 05/02/13 Impression Exercise Capacity:  Lexiscan with no exercise. BP Response:  Normal blood pressure response. Clinical Symptoms:  No significant symptoms noted. ECG Impression:  No significant ECG changes with Lexiscan. Comparison with Prior Nuclear Study: No images to compare  Overall Impression:  Low risk stress nuclear study with significant inferior bowel attenuation artifact. Inferoapical defect was worse at rest than stress. No reversible ischemia.  LV Wall Motion:  NL LV Function; NL Wall Motion; EF 58%.  Echo  12/02/11 Study Conclusions  - Left ventricle: The cavity size was normal. Wall thickness was increased in a pattern of mild LVH. Systolic function was normal. The estimated ejection fraction was in the range of 60% to 65%. Wall motion was normal; there were no regional wall motion abnormalities. Doppler parameters are consistent with abnormal left ventricular relaxation (grade 1 diastolic dysfunction). - Atrial septum: No defect or patent foramen ovale was identified.   Laboratory Data:  Chemistry Recent Labs  Lab 07/18/17 1937  NA 145  K 3.7  CL 122*  CO2 17*  GLUCOSE 62*  BUN 16  CREATININE 0.68  CALCIUM 5.7*  GFRNONAA >60  GFRAA >60  ANIONGAP 6    Recent Labs  Lab 07/18/17 1937  PROT 3.7*  ALBUMIN 2.2*  AST 45*  ALT 12*  ALKPHOS 34*  BILITOT 1.0   Hematology Recent Labs  Lab 07/18/17 2234  WBC 8.6  RBC 4.76  HGB 15.1*  HCT 44.8  MCV 94.1  MCH 31.7  MCHC 33.7  RDW 13.6  PLT 367   Cardiac Enzymes Recent Labs  Lab 07/18/17 1937 07/19/17 0247 07/19/17 0558  TROPONINI 0.11* 1.03* 0.91*    Recent Labs  Lab 07/18/17 2003  TROPIPOC 0.18*    BNP Recent Labs  Lab 07/18/17 2234  BNP 62.0  DDimer No results for input(s): DDIMER in the last 168 hours.  Radiology/Studies:  Dg Chest 2 View  Result Date: 07/18/2017 CLINICAL DATA:  Chest pain since this morning EXAM: CHEST - 2 VIEW COMPARISON:  04/09/2012 FINDINGS: AP view of the chest. Borderline cardiomegaly with tortuous atherosclerotic aorta. No active pulmonary disease. No acute osseous abnormality. IMPRESSION: No active cardiopulmonary disease. Aortic atherosclerosis. Borderline cardiomegaly. Electronically Signed   By: Ashley Royalty M.D.   On: 07/18/2017 20:53   Ct Head Wo Contrast  Result Date: 07/18/2017 CLINICAL DATA:  Headache and weakness for 2 days. EXAM: CT HEAD WITHOUT CONTRAST TECHNIQUE: Contiguous axial images were obtained from the base of the skull through the vertex  without intravenous contrast. COMPARISON:  10/16/2014 FINDINGS: Brain: No evidence of acute infarction, hemorrhage, hydrocephalus, extra-axial collection or mass lesion/mass effect. Vascular: No hyperdense vessel or unexpected calcification. Skull: Normal. Negative for fracture or focal lesion. Sinuses/Orbits: Air-fluid level in the right maxillary sinus. Partial opacification of the ethmoid sinuses. Other: None. IMPRESSION: No acute intracranial abnormality. Right maxillary and ethmoid sinusitis. Electronically Signed   By: Fidela Salisbury M.D.   On: 07/18/2017 22:09   Ct Angio Chest/abd/pel For Dissection W And/or Wo Contrast  Result Date: 07/18/2017 CLINICAL DATA:  Chest pain radiating to the neck and arms EXAM: CT ANGIOGRAPHY CHEST, ABDOMEN AND PELVIS TECHNIQUE: Multidetector CT imaging through the chest, abdomen and pelvis was performed using the standard protocol during bolus administration of intravenous contrast. Multiplanar reconstructed images and MIPs were obtained and reviewed to evaluate the vascular anatomy. CONTRAST:  100 mL Isovue 370 intravenous COMPARISON:  Chest x-ray 07/18/2017 FINDINGS: CTA CHEST FINDINGS Cardiovascular: Non contrasted images of the chest demonstrate no intramural hematoma. Mild aortic atherosclerosis. No aneurysm. No dissection. Mild cardiomegaly. No significant pericardial effusion Mediastinum/Nodes: Midline trachea. Subcentimeter hypodense thyroid nodules. No significant mediastinal adenopathy. Small esophageal hiatal hernia. Lungs/Pleura: No acute consolidation or pleural effusion. 2 mm right middle lobe pulmonary nodule, series 9, image number 64. Musculoskeletal: No chest wall abnormality. No acute or significant osseous findings. Review of the MIP images confirms the above findings. CTA ABDOMEN AND PELVIS FINDINGS VASCULAR Aorta: Normal caliber aorta without aneurysm, dissection, vasculitis or significant stenosis. Celiac: Patent without evidence of aneurysm,  dissection, vasculitis or significant stenosis. Left gastric artery appears to arise from the aorta, it is origin is not well visualized. SMA: Patent without evidence of aneurysm, dissection, vasculitis or significant stenosis. Renals: Single left and 2 right renal arteries are patent. IMA: Patent without evidence of aneurysm, dissection, vasculitis or significant stenosis. Inflow: Patent without evidence of aneurysm, dissection, vasculitis or significant stenosis. Review of the MIP images confirms the above findings. NON-VASCULAR Hepatobiliary: No focal liver abnormality is seen. No gallstones, gallbladder wall thickening, or biliary dilatation. Pancreas: Unremarkable. No pancreatic ductal dilatation or surrounding inflammatory changes. Spleen: Normal in size without focal abnormality. Adrenals/Urinary Tract: Adrenal glands are unremarkable. Kidneys are normal, without renal calculi, focal lesion, or hydronephrosis. Bladder is unremarkable. Stomach/Bowel: Stomach is within normal limits. Appendix appears normal. No evidence of bowel wall thickening, distention, or inflammatory changes. Sigmoid colon diverticula without acute inflammation Lymphatic: No significantly enlarged lymph nodes Reproductive: Status post hysterectomy. No adnexal masses. Other: Negative for free air or free fluid. Small fat in the umbilical region Musculoskeletal: No acute or significant osseous findings. Review of the MIP images confirms the above findings. IMPRESSION: 1. Negative for aortic aneurysm or dissection. 2. No significant vascular disease within the abdomen or pelvis. 3. 2 mm right middle  lobe pulmonary nodule. No follow-up needed if patient is low-risk. Non-contrast chest CT can be considered in 12 months if patient is high-risk. This recommendation follows the consensus statement: Guidelines for Management of Incidental Pulmonary Nodules Detected on CT Images: From the Fleischner Society 2017; Radiology 2017; 284:228-243. 4. No  CT evidence for acute intra-abdominal or pelvic abnormality 5. Sigmoid colon diverticula without acute inflammation Electronically Signed   By: Donavan Foil M.D.   On: 07/18/2017 22:28    Assessment and Plan:   1. Unstable angina atypical with pain not only chest and arms but from neck down, with mildly elevated troponin may have been due to HTN but does have hx of CAD and MI and prior PTCA to LAD.  No PE and no dissection.  Dr. Claiborne Billings to see.  Possible cardiac cath to further eval.  Does have abnormal EKG. Lat T wave inversion.   2.           Severe HTN. Now controlled.  She did have vasovagal episode about an hour ago.  Now resolved.   3.           Lt lip droop with vasovagal episode resolved quickly, none on this exam.  Mouth numbness resolved.   4.            Hx ant MI in 2006 with PTCA  to LAD, with residual 50% stenosis and linear dissection with TIMI-II flow in a long apical segmentlast cath 2011 with non obstructive disease.   5.            HLD on pravastatin.    6.             Tobacco use, instructed on importance of stopping.    For questions or updates, please contact [facility] Please consult [email or web address] for contac ordering questionable laboratory t info under Cardiology/STEMI.   Signed, Cecilie Kicks, NP  07/19/2017 10:06 AM    Patient seen and examined. Agree with assessment and plan.  Hayley Miller is a 57 year old African-American female who has a history of hypertension, hyperlipidemia, tobacco use, and CAD.  Stenosis.  The vessel was small and not stented.   In 2006, she had undergone PTCA of an apical LAD.  Repeat cardiac catheterization in 2011 did not reveal significant restenosis or obstructive disease.  Last evening, she developed a sensation of chest tightness and pressure with arm radiation.  She became significantly hypertensive with peak blood pressure [id number] shortly after arrival to the emergency room which was treated with IV labetalol.  The patient has  experienced a headache.  A CT scan of her head did not show any acute intracranial abnormality.  A CTA of her chest was negative for aortic aneurysm or dissection.  Incidentally she was found to have a 2 mm right middle lobe pulmonary nodule.  Initial laboratory was notable for with a calcium level of 5.7 and anion gap of 6.  This will be repeated.  Initial potassium was 3.7.  Creatinine 0.68 and BUN 16.  Troponins have increased to 1.03 since presentation.  She developed transient vagal response with hypotension to 77/48 and bradycardia to HR 47 which ultimately completely resolved.  Her ECG has involved progressive T wave abnormality inferiorly and anterolaterally and are worrisome for ischemic changes. P with her worrisome symptoms resently she denies any ongoing chest pain or dyspnea but admits to residual headache.  She denies visual blurring or arm or leg weakness.,  ECG changes, and mild elevation of  troponin, I have recommended definitive cardiac catheterization. I have reviewed the risks, indications, and alternatives to cardiac catheterization, possible angioplasty, and stenting with the patient. Risks include but are not limited to bleeding, infection, vascular injury, stroke, myocardial infection, arrhythmia, kidney injury, radiation-related injury in the case of prolonged fluoroscopy use, emergency cardiac surgery, and death. The patient understands the risks of serious complication is 1-2 in 8638 with diagnostic cardiac cath and 1-2% or less with angioplasty/stenting.  Will plan to schedule for the catheterization study study for later today as schedule allows.  Troy Sine, MD, [facility] 07/19/2017 11:19 AM

## 2017-07-19 NOTE — ED Notes (Signed)
Admitting MD - Dr. Bonner Puna paged regarding pain meds for HA. RN contact given on page.

## 2017-07-19 NOTE — Progress Notes (Signed)
Pt is refusing bed alarm. I informed pt about the reason for the bed alarm and requested that she call for assistance.

## 2017-07-19 NOTE — Progress Notes (Signed)
PROGRESS NOTE  Hayley Miller  WYO:[id number] DOB: [birth year] DOA: 07/18/2017 PCP: Hoyt Koch, MD   Brief Narrative: Hayley Miller is a 57 y.o. female with medical history significant of HTN, HLD, MI, CAD s/p PCI, anxiety, anemia; who presents with compliants of chest pain. This developed abruptly with chest tightness/pressure radiating to both arms upon waking. She also had nausea, 3 episodes of vomiting, diaphoresis, shortness of breath, and generalized weakness. She'd been taking goody powder TID for headaches in the past 3 days. On arrival to the ED she was hypertensive to 216/120. Labs revealed normal CBC, CO2 17, glucose 62, troponin  0.11. UDS was negative. CTA chest showed no PE, aortic aneurysm or dissection. She was admitted and troponin rose to 1.03, heparin IV started and cardiology consulted. ECG showed progressive T wave changes inferolaterally worrisome for ischemia. She experienced a transient vagal response w/BP down to 77/48, HR down to 47 which resolved spontaneously. Cardiology is planning cardiac catheterization and she is maintained on heparin gtt.   Assessment & Plan: Principal Problem:   NSTEMI (non-ST elevated myocardial infarction) [facility]) Active Problems:   CAD (coronary artery disease)   Chest pain   Hypertensive urgency   Hypocalcemia   Hypoglycemia  NSTEMI w/hx CAD s/p PTCA to LAD:  - Per cardiology, planning cath.  - Continue ASA, statin, beta blocker, heparin gtt, NTG prn - Echo ordered  HTN with hypertensive urgency:  - losartan and HCTZ.  - Beta blocker IV prn  Headache: Thought to be hypertensive. - Treat headache symptomatically as well  Vasovagal episode: With vital signs documented during the episode and symptoms of perioral numbness, difficulty speaking and left lid droop which resolved spontaneously. No persistent deficits.  - No stroke work up is indicated.   Hypocalcemia: This could be due to hyperventilation.  - Replaced, will recheck  w/albumin  Hypoglycemia: Resolved.  - Start diet when ok w/cardiology - Check AM cortisol  DVT prophylaxis: Heparin gtt Code Status: Full Family Communication: None at bedside Disposition Plan: Uncertain  Consultants:   Cardiology  Procedures:   LHC planned  Echocardiogram ordered  Antimicrobials:  None   Subjective: Chest pain is improved, mostly pain down right arm currently without injury. Headache persists as does high blood pressure.   Objective: Vitals:   07/19/17 1440 07/19/17 1450 07/19/17 1500 07/19/17 1515  BP:   (!) 181/110 (!) 160/99  Pulse: 83 90 86 81  Resp: (!) 23 16 16 14   Temp:      TempSrc:      SpO2: 96% 96% 96% 96%  Weight:      Height:        Intake/Output Summary (Last 24 hours) at 07/19/2017 1529 Last data filed at 07/19/2017 0414 Gross per 24 hour  Intake 220 ml  Output -  Net 220 ml   Filed Weights   07/18/17 1944  Weight: 76.7 kg (169 lb)    Gen: 57 y.o. female in no distress Pulm: Non-labored breathing room air. Clear to auscultation bilaterally.  CV: Regular rate and rhythm. No murmur, rub, or gallop. No JVD, no pedal edema. GI: Abdomen soft, non-tender, non-distended, with normoactive bowel sounds. No organomegaly or masses felt. Ext: Warm, no deformities Skin: No rashes, lesions or ulcers Neuro: Alert and oriented. No focal neurological deficits. Psych: Judgement and insight appear normal. Mood & affect appropriate.   Data Reviewed: I have personally reviewed following labs and imaging studies  CBC: Recent Labs  Lab 07/18/17 2234 07/19/17 0904  WBC 8.6 8.1  NEUTROABS 5.8 5.1  HGB 15.1* 14.5  HCT 44.8 44.2  MCV 94.1 94.0  PLT 367 440   Basic Metabolic Panel: Recent Labs  Lab 07/18/17 1937 07/19/17 0904 07/19/17 1302  NA 145 134* 140  K 3.7 5.2* 4.1  CL 122* 101 104  CO2 17* 20* 25  GLUCOSE 62* 74 95  BUN 16 14 15   CREATININE 0.68 0.94 0.96  CALCIUM 5.7* 9.4 10.1  MG  --  1.8  --    GFR: Estimated  Creatinine Clearance: 66.4 mL/min (by C-G formula based on SCr of 0.96 mg/dL). Liver Function Tests: Recent Labs  Lab 07/18/17 1937 07/19/17 1302  AST 45* 37  ALT 12* 13*  ALKPHOS 34* 55  BILITOT 1.0 0.9  PROT 3.7* 7.0  ALBUMIN 2.2* 3.9   Recent Labs  Lab 07/18/17 1937  LIPASE 23   No results for input(s): AMMONIA in the last 168 hours. Coagulation Profile: No results for input(s): INR, PROTIME in the last 168 hours. Cardiac Enzymes: Recent Labs  Lab 07/18/17 1937 07/19/17 0247 07/19/17 0558 07/19/17 0904  TROPONINI 0.11* 1.03* 0.91* 0.74*   BNP (last 3 results) No results for input(s): PROBNP in the last 8760 hours. HbA1C: No results for input(s): HGBA1C in the last 72 hours. CBG: Recent Labs  Lab 07/18/17 2253 07/19/17 0256 07/19/17 0555 07/19/17 0742  GLUCAP 117* 91 85 84   Lipid Profile: Recent Labs    07/19/17 0904  CHOL 240*  HDL 49  LDLCALC 170*  TRIG 105  CHOLHDL 4.9   Thyroid Function Tests: No results for input(s): TSH, T4TOTAL, FREET4, T3FREE, THYROIDAB in the last 72 hours. Anemia Panel: No results for input(s): VITAMINB12, FOLATE, FERRITIN, TIBC, IRON, RETICCTPCT in the last 72 hours. Urine analysis:    Component Value Date/Time   COLORURINE YELLOW 09/03/2008 2133   APPEARANCEUR CLOUDY (A) 09/03/2008 2133   LABSPEC 1.024 09/03/2008 2133   PHURINE 7.5 09/03/2008 2133   GLUCOSEU NEGATIVE 09/03/2008 2133   GLUCOSEU NEGATIVE 07/03/2008 1527   HGBUR SMALL (A) 09/03/2008 2133   BILIRUBINUR NEGATIVE 09/03/2008 2133   KETONESUR 15 (A) 09/03/2008 2133   PROTEINUR NEGATIVE 09/03/2008 2133   UROBILINOGEN 0.2 09/03/2008 2133   NITRITE NEGATIVE 09/03/2008 2133   LEUKOCYTESUR NEGATIVE 09/03/2008 2133   No results found for this or any previous visit (from the past 240 hour(s)).    Radiology Studies: Dg Chest 2 View  Result Date: 07/18/2017 CLINICAL DATA:  Chest pain since this morning EXAM: CHEST - 2 VIEW COMPARISON:  04/09/2012 FINDINGS:  AP view of the chest. Borderline cardiomegaly with tortuous atherosclerotic aorta. No active pulmonary disease. No acute osseous abnormality. IMPRESSION: No active cardiopulmonary disease. Aortic atherosclerosis. Borderline cardiomegaly. Electronically Signed   By: Ashley Royalty M.D.   On: 07/18/2017 20:53   Ct Head Wo Contrast  Result Date: 07/18/2017 CLINICAL DATA:  Headache and weakness for 2 days. EXAM: CT HEAD WITHOUT CONTRAST TECHNIQUE: Contiguous axial images were obtained from the base of the skull through the vertex without intravenous contrast. COMPARISON:  10/16/2014 FINDINGS: Brain: No evidence of acute infarction, hemorrhage, hydrocephalus, extra-axial collection or mass lesion/mass effect. Vascular: No hyperdense vessel or unexpected calcification. Skull: Normal. Negative for fracture or focal lesion. Sinuses/Orbits: Air-fluid level in the right maxillary sinus. Partial opacification of the ethmoid sinuses. Other: None. IMPRESSION: No acute intracranial abnormality. Right maxillary and ethmoid sinusitis. Electronically Signed   By: Fidela Salisbury M.D.   On: 07/18/2017 22:09   Ct  Angio Chest/abd/pel For Dissection W And/or Wo Contrast  Result Date: 07/18/2017 CLINICAL DATA:  Chest pain radiating to the neck and arms EXAM: CT ANGIOGRAPHY CHEST, ABDOMEN AND PELVIS TECHNIQUE: Multidetector CT imaging through the chest, abdomen and pelvis was performed using the standard protocol during bolus administration of intravenous contrast. Multiplanar reconstructed images and MIPs were obtained and reviewed to evaluate the vascular anatomy. CONTRAST:  100 mL Isovue 370 intravenous COMPARISON:  Chest x-ray 07/18/2017 FINDINGS: CTA CHEST FINDINGS Cardiovascular: Non contrasted images of the chest demonstrate no intramural hematoma. Mild aortic atherosclerosis. No aneurysm. No dissection. Mild cardiomegaly. No significant pericardial effusion Mediastinum/Nodes: Midline trachea. Subcentimeter hypodense  thyroid nodules. No significant mediastinal adenopathy. Small esophageal hiatal hernia. Lungs/Pleura: No acute consolidation or pleural effusion. 2 mm right middle lobe pulmonary nodule, series 9, image number 64. Musculoskeletal: No chest wall abnormality. No acute or significant osseous findings. Review of the MIP images confirms the above findings. CTA ABDOMEN AND PELVIS FINDINGS VASCULAR Aorta: Normal caliber aorta without aneurysm, dissection, vasculitis or significant stenosis. Celiac: Patent without evidence of aneurysm, dissection, vasculitis or significant stenosis. Left gastric artery appears to arise from the aorta, it is origin is not well visualized. SMA: Patent without evidence of aneurysm, dissection, vasculitis or significant stenosis. Renals: Single left and 2 right renal arteries are patent. IMA: Patent without evidence of aneurysm, dissection, vasculitis or significant stenosis. Inflow: Patent without evidence of aneurysm, dissection, vasculitis or significant stenosis. Review of the MIP images confirms the above findings. NON-VASCULAR Hepatobiliary: No focal liver abnormality is seen. No gallstones, gallbladder wall thickening, or biliary dilatation. Pancreas: Unremarkable. No pancreatic ductal dilatation or surrounding inflammatory changes. Spleen: Normal in size without focal abnormality. Adrenals/Urinary Tract: Adrenal glands are unremarkable. Kidneys are normal, without renal calculi, focal lesion, or hydronephrosis. Bladder is unremarkable. Stomach/Bowel: Stomach is within normal limits. Appendix appears normal. No evidence of bowel wall thickening, distention, or inflammatory changes. Sigmoid colon diverticula without acute inflammation Lymphatic: No significantly enlarged lymph nodes Reproductive: Status post hysterectomy. No adnexal masses. Other: Negative for free air or free fluid. Small fat in the umbilical region Musculoskeletal: No acute or significant osseous findings. Review of the  MIP images confirms the above findings. IMPRESSION: 1. Negative for aortic aneurysm or dissection. 2. No significant vascular disease within the abdomen or pelvis. 3. 2 mm right middle lobe pulmonary nodule. No follow-up needed if patient is low-risk. Non-contrast chest CT can be considered in 12 months if patient is high-risk. This recommendation follows the consensus statement: Guidelines for Management of Incidental Pulmonary Nodules Detected on CT Images: From the Fleischner Society 2017; Radiology 2017; 284:228-243. 4. No CT evidence for acute intra-abdominal or pelvic abnormality 5. Sigmoid colon diverticula without acute inflammation Electronically Signed   By: Donavan Foil M.D.   On: 07/18/2017 22:28    Scheduled Meds: . losartan  100 mg Oral Daily   And  . hydrochlorothiazide  12.5 mg Oral Daily  . pantoprazole  40 mg Oral Daily  . pravastatin  40 mg Oral QHS   Continuous Infusions: . heparin 1,000 Units/hr (07/19/17 0601)     LOS: 0 days   Time spent: 35 minutes.  Vance Gather, MD [facility] Pager [phone]  If 7PM-7AM, please contact night-coverage [email or web address] Password [email or web address] 07/19/2017, 3:29 PM

## 2017-07-19 NOTE — ED Notes (Addendum)
Pt had vagal episode where HR went down to 30's; diaphoretic; stated mouth numb; droop to left side when smiled. RN radioed charge who came to bedside. Droop resolved, grips equal, no drifts. HOB lowered and BP taking; HR and BP rising. Cards paged to come stat.

## 2017-07-20 ENCOUNTER — Other Ambulatory Visit: Payer: Self-pay

## 2017-07-20 ENCOUNTER — Observation Stay (HOSPITAL_BASED_OUTPATIENT_CLINIC_OR_DEPARTMENT_OTHER): Payer: Medicare Other

## 2017-07-20 ENCOUNTER — Encounter (HOSPITAL_COMMUNITY): Payer: Self-pay | Admitting: General Practice

## 2017-07-20 DIAGNOSIS — I351 Nonrheumatic aortic (valve) insufficiency: Secondary | ICD-10-CM | POA: Diagnosis not present

## 2017-07-20 DIAGNOSIS — I10 Essential (primary) hypertension: Secondary | ICD-10-CM

## 2017-07-20 DIAGNOSIS — I160 Hypertensive urgency: Secondary | ICD-10-CM | POA: Diagnosis not present

## 2017-07-20 DIAGNOSIS — I25110 Atherosclerotic heart disease of native coronary artery with unstable angina pectoris: Secondary | ICD-10-CM | POA: Diagnosis not present

## 2017-07-20 DIAGNOSIS — I214 Non-ST elevation (NSTEMI) myocardial infarction: Secondary | ICD-10-CM | POA: Diagnosis not present

## 2017-07-20 DIAGNOSIS — R51 Headache: Secondary | ICD-10-CM | POA: Diagnosis not present

## 2017-07-20 LAB — COMPREHENSIVE METABOLIC PANEL
ALBUMIN: 4 g/dL (ref 3.5–5.0)
ALK PHOS: 61 U/L (ref 38–126)
ALT: 16 U/L (ref 14–54)
ANION GAP: 12 (ref 5–15)
AST: 38 U/L (ref 15–41)
BUN: 13 mg/dL (ref 6–20)
CALCIUM: 10.2 mg/dL (ref 8.9–10.3)
CO2: 23 mmol/L (ref 22–32)
CREATININE: 0.96 mg/dL (ref 0.44–1.00)
Chloride: 103 mmol/L (ref 101–111)
GFR calc non Af Amer: >60 mL/min (ref >60)
GLUCOSE: 112 mg/dL — AB (ref 65–99)
Potassium: 3.1 mmol/L — ABNORMAL LOW (ref 3.5–5.1)
SODIUM: 138 mmol/L (ref 135–145)
Total Bilirubin: 0.7 mg/dL (ref 0.3–1.2)
Total Protein: 7.2 g/dL (ref 6.5–8.1)

## 2017-07-20 LAB — CORTISOL-AM, BLOOD: CORTISOL - AM: 12.8 ug/dL (ref 6.7–22.6)

## 2017-07-20 LAB — CBC
HCT: 43.7 % (ref 36.0–46.0)
HEMOGLOBIN: 14.8 g/dL (ref 12.0–15.0)
MCH: 31.7 pg (ref 26.0–34.0)
MCHC: 33.9 g/dL (ref 30.0–36.0)
MCV: 93.6 fL (ref 78.0–100.0)
Platelets: 372 10*3/uL (ref 150–400)
RBC: 4.67 MIL/uL (ref 3.87–5.11)
RDW: 14.5 % (ref 11.5–15.5)
WBC: 8.4 10*3/uL (ref 4.0–10.5)

## 2017-07-20 LAB — ECHOCARDIOGRAM COMPLETE
HEIGHTINCHES: 64 in
Weight: 2600 oz

## 2017-07-20 LAB — MAGNESIUM: Magnesium: 1.9 mg/dL (ref 1.7–2.4)

## 2017-07-20 MED ORDER — POTASSIUM CHLORIDE CRYS ER 20 MEQ PO TBCR
40.0000 meq | EXTENDED_RELEASE_TABLET | Freq: Once | ORAL | Status: AC
  Administered 2017-07-20: 40 meq via ORAL
  Filled 2017-07-20: qty 2

## 2017-07-20 MED ORDER — HYDRALAZINE HCL 20 MG/ML IJ SOLN
10.0000 mg | Freq: Once | INTRAMUSCULAR | Status: AC
  Administered 2017-07-20: 10 mg via INTRAVENOUS
  Filled 2017-07-20: qty 1

## 2017-07-20 MED ORDER — HYDRALAZINE HCL 25 MG PO TABS
25.0000 mg | ORAL_TABLET | Freq: Three times a day (TID) | ORAL | Status: DC
  Administered 2017-07-20 – 2017-07-21 (×3): 25 mg via ORAL
  Filled 2017-07-20 (×3): qty 1

## 2017-07-20 MED ORDER — AMLODIPINE BESYLATE 5 MG PO TABS
5.0000 mg | ORAL_TABLET | Freq: Every day | ORAL | Status: DC
  Administered 2017-07-20 – 2017-07-21 (×2): 5 mg via ORAL
  Filled 2017-07-20 (×2): qty 1

## 2017-07-20 MED ORDER — METOPROLOL TARTRATE 25 MG PO TABS
25.0000 mg | ORAL_TABLET | Freq: Two times a day (BID) | ORAL | Status: DC
  Administered 2017-07-20 – 2017-07-21 (×3): 25 mg via ORAL
  Filled 2017-07-20 (×3): qty 1

## 2017-07-20 MED FILL — Heparin Sod (Porcine)-NaCl IV Soln 1000 Unit/500ML-0.9%: INTRAVENOUS | Qty: 1000 | Status: AC

## 2017-07-20 NOTE — Progress Notes (Signed)
Pt stated, "It's ridiculous that I'm in the hospital and nobody can do nothing for my headache." (See MAR)

## 2017-07-20 NOTE — Progress Notes (Signed)
  Echocardiogram 2D Echocardiogram has been performed.  Jennette Dubin 07/20/2017, 10:17 AM

## 2017-07-20 NOTE — Progress Notes (Addendum)
PROGRESS NOTE  Loneta Tamplin Hynes  GBT:[id number] DOB: [birth year] DOA: 07/18/2017 PCP: Hoyt Koch, MD   Brief Narrative: Thalya Fouche Hyer is a 57 y.o. female with medical history significant of HTN, HLD, MI, CAD s/p PCI, anxiety, anemia; who presents with compliants of chest pain. This developed abruptly with chest tightness/pressure radiating to both arms upon waking. She also had nausea, 3 episodes of vomiting, diaphoresis, shortness of breath, and generalized weakness. She'd been taking goody powder TID for headaches in the past 3 days. On arrival to the ED she was hypertensive to 216/120. Labs revealed normal CBC, CO2 17, glucose 62, troponin  0.11. UDS was negative. CTA chest showed no PE, aortic aneurysm or dissection. She was admitted and troponin rose to 1.03, heparin IV started and cardiology consulted. ECG showed progressive T wave changes inferolaterally worrisome for ischemia. She experienced a transient vagal response w/BP down to 77/48, HR down to 47 which resolved spontaneously. Cardiac catheterization revealed patent coronaries 4/23. Blood pressure has continued to be in the severe range despite multiple medications in addition to home medications.   Assessment & Plan: Principal Problem:   NSTEMI (non-ST elevated myocardial infarction) ([place]) Active Problems:   CAD (coronary artery disease)   Chest pain   Hypertensive urgency   Hypocalcemia   Hypoglycemia  Unstable angina w/hx CAD s/p PTCA to LAD: Symptoms currently subsided. Patent coronaries on catheterization 4/23.  - Continue ASA, statin, beta blocker, NTG prn - Echo ordered  HTN with hypertensive urgency:  - Continue home Losartan 100mg  and HCTZ 12.5mg   - Start metoprolol 25mg  BID, hydralazine 25mg  TID.  - Would consider starting spironolactone if BP remains elevated over next 24 hrs.  - Beta blocker IV prn  Headache: Thought to be hypertensive. - Treat headache symptomatically with fioricet and normalize BP  slowly  Vasovagal episode: With vital signs documented during the episode and symptoms of perioral numbness, difficulty speaking and left lid droop which resolved spontaneously. No persistent deficits.  - No stroke work up is indicated.   Hypocalcemia: This could be due to hyperventilation. Resolved.   Hypoglycemia: Resolved. Cortisol ok.   Hypokalemia: Replace and recheck in AM, Mg ok.  - If persists, may need hyperaldo w/u  DVT prophylaxis: Subcutaneous heparin Code Status: Full Family Communication: Son at bedside Disposition Plan: Uncertain  Consultants:   Cardiology  Procedures:  LEFT HEART CATH AND CORONARY ANGIOGRAPHY   The left ventricular systolic function is normal. LV end diastolic pressure is normal. The left ventricular ejection fraction is 50-55% by visual estimate.   1. Normal coronary anatomy 2. Overall normal LV function. Localized akinesis of the inferior apex 3. Normal LVEDP  Plan: medical management including optimization of BP control.     Echocardiogram ordered  Antimicrobials:  None   Subjective: Chest pain is resolved. No numbness, weakness, palpitations or dyspnea. Bifrontal headache remains constant, severe, pounding, somewhat worse with lights but not alleviated in dark. Fioricet with some improvement as well.   Objective: Vitals:   07/20/17 0219 07/20/17 0621 07/20/17 0825 07/20/17 1222  BP: (!) 182/114 (!) 168/102 (!) 160/130 (!) 197/119  Pulse: 96 (!) 102 (!) 102 82  Resp:      Temp:  98.3 F (36.8 C)    TempSrc:  Oral    SpO2:  100%    Weight:      Height:        Intake/Output Summary (Last 24 hours) at 07/20/2017 1225 Last data filed at 07/20/2017 0600 Gross per  24 hour  Intake 920 ml  Output 1050 ml  Net -130 ml   Filed Weights   07/18/17 1944 07/19/17 1530  Weight: 76.7 kg (169 lb) 73.7 kg (162 lb 8 oz)    Gen: 58 y.o. female in no distress laying in bed in the dark Pulm: Non-labored breathing room air. Clear to  auscultation bilaterally.  CV: Regular rate and rhythm. No murmur, rub, or gallop. No JVD, no pedal edema. GI: Abdomen soft, non-tender, non-distended, with normoactive bowel sounds. No organomegaly or masses felt. Ext: Warm, no deformities Skin: Right radial site with ecchymosis, tenderness without ongoing bleeding. Neuro: Alert and oriented. No focal neurological deficits. Psych: Judgement and insight appear normal. Mood & affect appropriate.   Data Reviewed: I have personally reviewed following labs and imaging studies  CBC: Recent Labs  Lab 07/18/17 2234 07/19/17 0904 07/20/17 0558  WBC 8.6 8.1 8.4  NEUTROABS 5.8 5.1  --   HGB 15.1* 14.5 14.8  HCT 44.8 44.2 43.7  MCV 94.1 94.0 93.6  PLT 367 329 092   Basic Metabolic Panel: Recent Labs  Lab 07/18/17 1937 07/19/17 0904 07/19/17 1302 07/20/17 0558  NA 145 134* 140 138  K 3.7 5.2* 4.1 3.1*  CL 122* 101 104 103  CO2 17* 20* 25 23  GLUCOSE 62* 74 95 112*  BUN 16 14 15 13   CREATININE 0.68 0.94 0.96 0.96  CALCIUM 5.7* 9.4 10.1 10.2  MG  --  1.8  --  1.9   GFR: Estimated Creatinine Clearance: 65.1 mL/min (by C-G formula based on SCr of 0.96 mg/dL). Liver Function Tests: Recent Labs  Lab 07/18/17 1937 07/19/17 1302 07/20/17 0558  AST 45* 37 38  ALT 12* 13* 16  ALKPHOS 34* 55 61  BILITOT 1.0 0.9 0.7  PROT 3.7* 7.0 7.2  ALBUMIN 2.2* 3.9 4.0   Recent Labs  Lab 07/18/17 1937  LIPASE 23   No results for input(s): AMMONIA in the last 168 hours. Coagulation Profile: No results for input(s): INR, PROTIME in the last 168 hours. Cardiac Enzymes: Recent Labs  Lab 07/18/17 1937 07/19/17 0247 07/19/17 0558 07/19/17 0904  TROPONINI 0.11* 1.03* 0.91* 0.74*   BNP (last 3 results) No results for input(s): PROBNP in the last 8760 hours. HbA1C: No results for input(s): HGBA1C in the last 72 hours. CBG: Recent Labs  Lab 07/18/17 2253 07/19/17 0256 07/19/17 0555 07/19/17 0742  GLUCAP 117* 91 85 84   Lipid  Profile: Recent Labs    07/19/17 0904  CHOL 240*  HDL 49  LDLCALC 170*  TRIG 105  CHOLHDL 4.9   Thyroid Function Tests: No results for input(s): TSH, T4TOTAL, FREET4, T3FREE, THYROIDAB in the last 72 hours. Anemia Panel: No results for input(s): VITAMINB12, FOLATE, FERRITIN, TIBC, IRON, RETICCTPCT in the last 72 hours. Urine analysis:    Component Value Date/Time   COLORURINE YELLOW 09/03/2008 2133   APPEARANCEUR CLOUDY (A) 09/03/2008 2133   LABSPEC 1.024 09/03/2008 2133   PHURINE 7.5 09/03/2008 2133   GLUCOSEU NEGATIVE 09/03/2008 2133   GLUCOSEU NEGATIVE 07/03/2008 1527   HGBUR SMALL (A) 09/03/2008 2133   BILIRUBINUR NEGATIVE 09/03/2008 2133   KETONESUR 15 (A) 09/03/2008 2133   PROTEINUR NEGATIVE 09/03/2008 2133   UROBILINOGEN 0.2 09/03/2008 2133   NITRITE NEGATIVE 09/03/2008 2133   LEUKOCYTESUR NEGATIVE 09/03/2008 2133   No results found for this or any previous visit (from the past 240 hour(s)).    Radiology Studies: Dg Chest 2 View  Result Date: 07/18/2017 CLINICAL  DATA:  Chest pain since this morning EXAM: CHEST - 2 VIEW COMPARISON:  04/09/2012 FINDINGS: AP view of the chest. Borderline cardiomegaly with tortuous atherosclerotic aorta. No active pulmonary disease. No acute osseous abnormality. IMPRESSION: No active cardiopulmonary disease. Aortic atherosclerosis. Borderline cardiomegaly. Electronically Signed   By: Ashley Royalty M.D.   On: 07/18/2017 20:53   Ct Head Wo Contrast  Result Date: 07/18/2017 CLINICAL DATA:  Headache and weakness for 2 days. EXAM: CT HEAD WITHOUT CONTRAST TECHNIQUE: Contiguous axial images were obtained from the base of the skull through the vertex without intravenous contrast. COMPARISON:  10/16/2014 FINDINGS: Brain: No evidence of acute infarction, hemorrhage, hydrocephalus, extra-axial collection or mass lesion/mass effect. Vascular: No hyperdense vessel or unexpected calcification. Skull: Normal. Negative for fracture or focal lesion.  Sinuses/Orbits: Air-fluid level in the right maxillary sinus. Partial opacification of the ethmoid sinuses. Other: None. IMPRESSION: No acute intracranial abnormality. Right maxillary and ethmoid sinusitis. Electronically Signed   By: Fidela Salisbury M.D.   On: 07/18/2017 22:09   Ct Angio Chest/abd/pel For Dissection W And/or Wo Contrast  Result Date: 07/18/2017 CLINICAL DATA:  Chest pain radiating to the neck and arms EXAM: CT ANGIOGRAPHY CHEST, ABDOMEN AND PELVIS TECHNIQUE: Multidetector CT imaging through the chest, abdomen and pelvis was performed using the standard protocol during bolus administration of intravenous contrast. Multiplanar reconstructed images and MIPs were obtained and reviewed to evaluate the vascular anatomy. CONTRAST:  100 mL Isovue 370 intravenous COMPARISON:  Chest x-ray 07/18/2017 FINDINGS: CTA CHEST FINDINGS Cardiovascular: Non contrasted images of the chest demonstrate no intramural hematoma. Mild aortic atherosclerosis. No aneurysm. No dissection. Mild cardiomegaly. No significant pericardial effusion Mediastinum/Nodes: Midline trachea. Subcentimeter hypodense thyroid nodules. No significant mediastinal adenopathy. Small esophageal hiatal hernia. Lungs/Pleura: No acute consolidation or pleural effusion. 2 mm right middle lobe pulmonary nodule, series 9, image number 64. Musculoskeletal: No chest wall abnormality. No acute or significant osseous findings. Review of the MIP images confirms the above findings. CTA ABDOMEN AND PELVIS FINDINGS VASCULAR Aorta: Normal caliber aorta without aneurysm, dissection, vasculitis or significant stenosis. Celiac: Patent without evidence of aneurysm, dissection, vasculitis or significant stenosis. Left gastric artery appears to arise from the aorta, it is origin is not well visualized. SMA: Patent without evidence of aneurysm, dissection, vasculitis or significant stenosis. Renals: Single left and 2 right renal arteries are patent. IMA: Patent  without evidence of aneurysm, dissection, vasculitis or significant stenosis. Inflow: Patent without evidence of aneurysm, dissection, vasculitis or significant stenosis. Review of the MIP images confirms the above findings. NON-VASCULAR Hepatobiliary: No focal liver abnormality is seen. No gallstones, gallbladder wall thickening, or biliary dilatation. Pancreas: Unremarkable. No pancreatic ductal dilatation or surrounding inflammatory changes. Spleen: Normal in size without focal abnormality. Adrenals/Urinary Tract: Adrenal glands are unremarkable. Kidneys are normal, without renal calculi, focal lesion, or hydronephrosis. Bladder is unremarkable. Stomach/Bowel: Stomach is within normal limits. Appendix appears normal. No evidence of bowel wall thickening, distention, or inflammatory changes. Sigmoid colon diverticula without acute inflammation Lymphatic: No significantly enlarged lymph nodes Reproductive: Status post hysterectomy. No adnexal masses. Other: Negative for free air or free fluid. Small fat in the umbilical region Musculoskeletal: No acute or significant osseous findings. Review of the MIP images confirms the above findings. IMPRESSION: 1. Negative for aortic aneurysm or dissection. 2. No significant vascular disease within the abdomen or pelvis. 3. 2 mm right middle lobe pulmonary nodule. No follow-up needed if patient is low-risk. Non-contrast chest CT can be considered in 12 months if patient is high-risk.  This recommendation follows the consensus statement: Guidelines for Management of Incidental Pulmonary Nodules Detected on CT Images: From the Fleischner Society 2017; Radiology 2017; 284:228-243. 4. No CT evidence for acute intra-abdominal or pelvic abnormality 5. Sigmoid colon diverticula without acute inflammation Electronically Signed   By: Donavan Foil M.D.   On: 07/18/2017 22:28    Scheduled Meds: . heparin  5,000 Units Subcutaneous Q8H  . hydrALAZINE  25 mg Oral Q8H  . losartan  100  mg Oral Daily   And  . hydrochlorothiazide  12.5 mg Oral Daily  . metoprolol tartrate  25 mg Oral BID  . pantoprazole  40 mg Oral Daily  . pravastatin  40 mg Oral QHS  . sodium chloride flush  3 mL Intravenous Q12H   Continuous Infusions: . sodium chloride       LOS: 0 days   Time spent: 25 minutes.  Vance Gather, MD [facility] Pager [phone]  If 7PM-7AM, please contact night-coverage [email or web address] Password [facility] 07/20/2017, 12:25 PM

## 2017-07-20 NOTE — Progress Notes (Signed)
Visited with patient in regards to Advanced Directive.  Assisting patient complete paperwork with witnesses and notary.  Will follow up this afternoon to make sure it is completed.    07/20/17 1200  Clinical Encounter Type  Visited With Patient  Visit Type Initial;Spiritual support (Advanced Directive)

## 2017-07-20 NOTE — Progress Notes (Signed)
Progress Note  Patient Name: Hayley Miller Date of Encounter: 07/20/2017  Primary Cardiologist: Dorris Carnes, MD   Subjective   Pt denies CP   Breathing is OK    Inpatient Medications    Scheduled Meds: . amLODipine  5 mg Oral Daily  . heparin  5,000 Units Subcutaneous Q8H  . hydrALAZINE  25 mg Oral Q8H  . losartan  100 mg Oral Daily   And  . hydrochlorothiazide  12.5 mg Oral Daily  . metoprolol tartrate  25 mg Oral BID  . pantoprazole  40 mg Oral Daily  . pravastatin  40 mg Oral QHS  . sodium chloride flush  3 mL Intravenous Q12H   Continuous Infusions: . sodium chloride     PRN Meds: sodium chloride, acetaminophen, butalbital-acetaminophen-caffeine, gi cocktail, hydrALAZINE, labetalol, morphine injection, nitroGLYCERIN, ondansetron (ZOFRAN) IV, sodium chloride flush, tetrahydrozoline   Vital Signs    Vitals:   07/20/17 0219 07/20/17 0621 07/20/17 0825 07/20/17 1222  BP: (!) 182/114 (!) 168/102 (!) 160/130 (!) 197/119  Pulse: 96 (!) 102 (!) 102 82  Resp:      Temp:  98.3 F (36.8 C)    TempSrc:  Oral    SpO2:  100%    Weight:      Height:        Intake/Output Summary (Last 24 hours) at 07/20/2017 1423 Last data filed at 07/20/2017 0600 Gross per 24 hour  Intake 920 ml  Output 1050 ml  Net -130 ml   Filed Weights   07/18/17 1944 07/19/17 1530  Weight: 169 lb (76.7 kg) 162 lb 8 oz (73.7 kg)    Telemetry    SR   - Personally Reviewed    Physical Exam   GEN: No acute distress.   Neck: No JVD Cardiac: RRR, no murmurs, rubs, or gallops.  Respiratory: Clear to auscultation bilaterally. GI: Soft, nontender, non-distended  MS: No edema; R wrist with brusing   SOft   Dressing is dry   Neuro:  Nonfocal  Psych: Normal affect   Labs    Chemistry Recent Labs  Lab 07/18/17 1937 07/19/17 0904 07/19/17 1302 07/20/17 0558  NA 145 134* 140 138  K 3.7 5.2* 4.1 3.1*  CL 122* 101 104 103  CO2 17* 20* 25 23  GLUCOSE 62* 74 95 112*  BUN 16 14 15 13     CREATININE 0.68 0.94 0.96 0.96  CALCIUM 5.7* 9.4 10.1 10.2  PROT 3.7*  --  7.0 7.2  ALBUMIN 2.2*  --  3.9 4.0  AST 45*  --  37 38  ALT 12*  --  13* 16  ALKPHOS 34*  --  55 61  BILITOT 1.0  --  0.9 0.7  GFRNONAA >60 >60 >60 >60  GFRAA >60 >60 >60 >60  ANIONGAP 6 13 11 12      Hematology Recent Labs  Lab 07/18/17 2234 07/19/17 0904 07/20/17 0558  WBC 8.6 8.1 8.4  RBC 4.76 4.70 4.67  HGB 15.1* 14.5 14.8  HCT 44.8 44.2 43.7  MCV 94.1 94.0 93.6  MCH 31.7 30.9 31.7  MCHC 33.7 32.8 33.9  RDW 13.6 14.0 14.5  PLT 367 329 372    Cardiac Enzymes Recent Labs  Lab 07/18/17 1937 07/19/17 0247 07/19/17 0558 07/19/17 0904  TROPONINI 0.11* 1.03* 0.91* 0.74*    Recent Labs  Lab 07/18/17 2003  TROPIPOC 0.18*     BNP Recent Labs  Lab 07/18/17 2234  BNP 62.0     DDimer  No results for input(s): DDIMER in the last 168 hours.   Radiology    Dg Chest 2 View  Result Date: 07/18/2017 CLINICAL DATA:  Chest pain since this morning EXAM: CHEST - 2 VIEW COMPARISON:  04/09/2012 FINDINGS: AP view of the chest. Borderline cardiomegaly with tortuous atherosclerotic aorta. No active pulmonary disease. No acute osseous abnormality. IMPRESSION: No active cardiopulmonary disease. Aortic atherosclerosis. Borderline cardiomegaly. Electronically Signed   By: Ashley Royalty M.D.   On: 07/18/2017 20:53   Ct Head Wo Contrast  Result Date: 07/18/2017 CLINICAL DATA:  Headache and weakness for 2 days. EXAM: CT HEAD WITHOUT CONTRAST TECHNIQUE: Contiguous axial images were obtained from the base of the skull through the vertex without intravenous contrast. COMPARISON:  10/16/2014 FINDINGS: Brain: No evidence of acute infarction, hemorrhage, hydrocephalus, extra-axial collection or mass lesion/mass effect. Vascular: No hyperdense vessel or unexpected calcification. Skull: Normal. Negative for fracture or focal lesion. Sinuses/Orbits: Air-fluid level in the right maxillary sinus. Partial opacification of the  ethmoid sinuses. Other: None. IMPRESSION: No acute intracranial abnormality. Right maxillary and ethmoid sinusitis. Electronically Signed   By: Fidela Salisbury M.D.   On: 07/18/2017 22:09   Ct Angio Chest/abd/pel For Dissection W And/or Wo Contrast  Result Date: 07/18/2017 CLINICAL DATA:  Chest pain radiating to the neck and arms EXAM: CT ANGIOGRAPHY CHEST, ABDOMEN AND PELVIS TECHNIQUE: Multidetector CT imaging through the chest, abdomen and pelvis was performed using the standard protocol during bolus administration of intravenous contrast. Multiplanar reconstructed images and MIPs were obtained and reviewed to evaluate the vascular anatomy. CONTRAST:  100 mL Isovue 370 intravenous COMPARISON:  Chest x-ray 07/18/2017 FINDINGS: CTA CHEST FINDINGS Cardiovascular: Non contrasted images of the chest demonstrate no intramural hematoma. Mild aortic atherosclerosis. No aneurysm. No dissection. Mild cardiomegaly. No significant pericardial effusion Mediastinum/Nodes: Midline trachea. Subcentimeter hypodense thyroid nodules. No significant mediastinal adenopathy. Small esophageal hiatal hernia. Lungs/Pleura: No acute consolidation or pleural effusion. 2 mm right middle lobe pulmonary nodule, series 9, image number 64. Musculoskeletal: No chest wall abnormality. No acute or significant osseous findings. Review of the MIP images confirms the above findings. CTA ABDOMEN AND PELVIS FINDINGS VASCULAR Aorta: Normal caliber aorta without aneurysm, dissection, vasculitis or significant stenosis. Celiac: Patent without evidence of aneurysm, dissection, vasculitis or significant stenosis. Left gastric artery appears to arise from the aorta, it is origin is not well visualized. SMA: Patent without evidence of aneurysm, dissection, vasculitis or significant stenosis. Renals: Single left and 2 right renal arteries are patent. IMA: Patent without evidence of aneurysm, dissection, vasculitis or significant stenosis. Inflow: Patent  without evidence of aneurysm, dissection, vasculitis or significant stenosis. Review of the MIP images confirms the above findings. NON-VASCULAR Hepatobiliary: No focal liver abnormality is seen. No gallstones, gallbladder wall thickening, or biliary dilatation. Pancreas: Unremarkable. No pancreatic ductal dilatation or surrounding inflammatory changes. Spleen: Normal in size without focal abnormality. Adrenals/Urinary Tract: Adrenal glands are unremarkable. Kidneys are normal, without renal calculi, focal lesion, or hydronephrosis. Bladder is unremarkable. Stomach/Bowel: Stomach is within normal limits. Appendix appears normal. No evidence of bowel wall thickening, distention, or inflammatory changes. Sigmoid colon diverticula without acute inflammation Lymphatic: No significantly enlarged lymph nodes Reproductive: Status post hysterectomy. No adnexal masses. Other: Negative for free air or free fluid. Small fat in the umbilical region Musculoskeletal: No acute or significant osseous findings. Review of the MIP images confirms the above findings. IMPRESSION: 1. Negative for aortic aneurysm or dissection. 2. No significant vascular disease within the abdomen or pelvis. 3. 2 mm  right middle lobe pulmonary nodule. No follow-up needed if patient is low-risk. Non-contrast chest CT can be considered in 12 months if patient is high-risk. This recommendation follows the consensus statement: Guidelines for Management of Incidental Pulmonary Nodules Detected on CT Images: From the Fleischner Society 2017; Radiology 2017; 284:228-243. 4. No CT evidence for acute intra-abdominal or pelvic abnormality 5. Sigmoid colon diverticula without acute inflammation Electronically Signed   By: Donavan Foil M.D.   On: 07/18/2017 22:28    Cardiac Studies   Echo done today    Patient Profile     56 y.o. female hx of CAD (MI in 2006 with PTCA to LAD   Cath in 2011 with irreg LAD (? If initial event in 2006 was dissection that  healed)  Echo 2013 normal by report  Presented with CP  Episode occurred while pt was vomiting, dizzy    Assessment & Plan    1  CP   Pt had spell of vomiting, BM   She became diapharetic, dizzy, flushed    SInce admit has had trop elevation (peak of 1)  Cath yestreday showed normal coronary arteries   LVEF normal but apical akinesis,  Echo today again shows this defect at the apex ? If initial event in 2006 was SCAD that healed in artery.    Still with trop elevation  I would recomm adding amlodipine to regimen for BP control  Continue to Rx BP  2  HTN  BP is labile but high  Will add amlodipine to regimen and follow    Ambulate   For questions or updates, please contact [place] Please consult [email or web address] for contact info under Cardiology/STEMI.      Signed, Dorris Carnes, MD  07/20/2017, 2:23 PM

## 2017-07-21 DIAGNOSIS — R079 Chest pain, unspecified: Secondary | ICD-10-CM | POA: Diagnosis not present

## 2017-07-21 DIAGNOSIS — I10 Essential (primary) hypertension: Secondary | ICD-10-CM | POA: Diagnosis not present

## 2017-07-21 DIAGNOSIS — I25110 Atherosclerotic heart disease of native coronary artery with unstable angina pectoris: Secondary | ICD-10-CM | POA: Diagnosis not present

## 2017-07-21 DIAGNOSIS — I160 Hypertensive urgency: Secondary | ICD-10-CM | POA: Diagnosis not present

## 2017-07-21 LAB — BASIC METABOLIC PANEL
Anion gap: 10 (ref 5–15)
BUN: 19 mg/dL (ref 6–20)
CHLORIDE: 103 mmol/L (ref 101–111)
CO2: 25 mmol/L (ref 22–32)
Calcium: 10.1 mg/dL (ref 8.9–10.3)
Creatinine, Ser: 1.13 mg/dL — ABNORMAL HIGH (ref 0.44–1.00)
GFR calc Af Amer: >60 mL/min (ref >60)
GFR calc non Af Amer: 54 mL/min — ABNORMAL LOW (ref >60)
GLUCOSE: 90 mg/dL (ref 65–99)
POTASSIUM: 3.4 mmol/L — AB (ref 3.5–5.1)
SODIUM: 138 mmol/L (ref 135–145)

## 2017-07-21 MED ORDER — METOPROLOL TARTRATE 25 MG PO TABS: 25.0000 mg | ORAL_TABLET | Freq: Two times a day (BID) | ORAL | 0 refills | Status: DC

## 2017-07-21 MED ORDER — POTASSIUM CHLORIDE CRYS ER 20 MEQ PO TBCR
40.0000 meq | EXTENDED_RELEASE_TABLET | Freq: Once | ORAL | Status: AC
Start: 2017-07-21 — End: 2017-07-21
  Administered 2017-07-21: 40 meq via ORAL
  Filled 2017-07-21: qty 2

## 2017-07-21 MED ORDER — HYDRALAZINE HCL 25 MG PO TABS: 25.0000 mg | ORAL_TABLET | Freq: Three times a day (TID) | ORAL | 0 refills | Status: DC

## 2017-07-21 MED ORDER — AMLODIPINE BESYLATE 5 MG PO TABS: 5.0000 mg | ORAL_TABLET | Freq: Every day | ORAL | 0 refills | Status: DC

## 2017-07-21 NOTE — Progress Notes (Addendum)
Progress Note  Patient Name: Hayley Miller Date of Encounter: 07/21/2017  Primary Cardiologist: Dorris Carnes, MD   Subjective   No chest pain. C/o pain right wrist cath site.   Inpatient Medications    Scheduled Meds: . amLODipine  5 mg Oral Daily  . heparin  5,000 Units Subcutaneous Q8H  . hydrALAZINE  25 mg Oral Q8H  . losartan  100 mg Oral Daily   And  . hydrochlorothiazide  12.5 mg Oral Daily  . metoprolol tartrate  25 mg Oral BID  . pantoprazole  40 mg Oral Daily  . potassium chloride  40 mEq Oral Once  . pravastatin  40 mg Oral QHS  . sodium chloride flush  3 mL Intravenous Q12H   Continuous Infusions: . sodium chloride     PRN Meds: sodium chloride, acetaminophen, butalbital-acetaminophen-caffeine, gi cocktail, hydrALAZINE, labetalol, morphine injection, nitroGLYCERIN, ondansetron (ZOFRAN) IV, sodium chloride flush, tetrahydrozoline   Vital Signs    Vitals:   07/20/17 1222 07/20/17 1500 07/20/17 2127 07/21/17 0607  BP: (!) 197/119 (!) 158/108 (!) 161/92 (!) 154/92  Pulse: 82  90 81  Resp:   18 18  Temp:   98.7 F (37.1 C) 98.7 F (37.1 C)  TempSrc:   Oral Oral  SpO2:   98% 95%  Weight:    164 lb 3.2 oz (74.5 kg)  Height:        Intake/Output Summary (Last 24 hours) at 07/21/2017 0929 Last data filed at 07/21/2017 5170 Gross per 24 hour  Intake 600 ml  Output 300 ml  Net 300 ml   Filed Weights   07/18/17 1944 07/19/17 1530 07/21/17 0607  Weight: 169 lb (76.7 kg) 162 lb 8 oz (73.7 kg) 164 lb 3.2 oz (74.5 kg)    Telemetry    NSR - Personally Reviewed  ECG    No new tracings - Personally Reviewed  Physical Exam   GEN: No acute distress.   Neck: No JVD Cardiac: RRR, no murmurs, rubs, or gallops.  Respiratory: Clear to auscultation bilaterally. GI: Soft, nontender, non-distended  MS: No edema; No deformity. Neuro:  Nonfocal  Psych: Normal affect   Right radial cath site firm and tender. Radial pulse present, cap refill <3 sec.   Labs    Chemistry Recent Labs  Lab 07/18/17 1937  07/19/17 1302 07/20/17 0558 07/21/17 0412  NA 145   < > 140 138 138  K 3.7   < > 4.1 3.1* 3.4*  CL 122*   < > 104 103 103  CO2 17*   < > 25 23 25   GLUCOSE 62*   < > 95 112* 90  BUN 16   < > 15 13 19   CREATININE 0.68   < > 0.96 0.96 1.13*  CALCIUM 5.7*   < > 10.1 10.2 10.1  PROT 3.7*  --  7.0 7.2  --   ALBUMIN 2.2*  --  3.9 4.0  --   AST 45*  --  37 38  --   ALT 12*  --  13* 16  --   ALKPHOS 34*  --  55 61  --   BILITOT 1.0  --  0.9 0.7  --   GFRNONAA >60   < > >60 >60 54*  GFRAA >60   < > >60 >60 >60  ANIONGAP 6   < > 11 12 10    < > = values in this interval not displayed.     Hematology Recent Labs  Lab 07/18/17 2234 07/19/17 0904 07/20/17 0558  WBC 8.6 8.1 8.4  RBC 4.76 4.70 4.67  HGB 15.1* 14.5 14.8  HCT 44.8 44.2 43.7  MCV 94.1 94.0 93.6  MCH 31.7 30.9 31.7  MCHC 33.7 32.8 33.9  RDW 13.6 14.0 14.5  PLT 367 329 372    Cardiac Enzymes Recent Labs  Lab 07/18/17 1937 07/19/17 0247 07/19/17 0558 07/19/17 0904  TROPONINI 0.11* 1.03* 0.91* 0.74*    Recent Labs  Lab 07/18/17 2003  TROPIPOC 0.18*     BNP Recent Labs  Lab 07/18/17 2234  BNP 62.0     DDimer No results for input(s): DDIMER in the last 168 hours.   Radiology    No results found.  Cardiac Studies   Echocardiogram 07/20/17 Study Conclusions - Left ventricle: The cavity size was normal. Wall thickness was   increased in a pattern of moderate LVH. Systolic function was   normal. The estimated ejection fraction was in the range of 50%   to 55%. There is akinesis of the apicalinferior myocardium.   Doppler parameters are consistent with abnormal left ventricular   relaxation (grade 1 diastolic dysfunction). - Aortic valve: There was mild regurgitation. - Pulmonary arteries: Systolic pressure was mildly increased. PA   peak pressure: 36 mm Hg (S).  Impressions: - Akinesis of the apical inferior wall with overall low normal LV    systolic function; moderate LVH; mild diastolic dysfunction; mild   AI; trace TR with mild pulmonary hypertension.   LEFT HEART CATH AND CORONARY ANGIOGRAPHY 07/19/17  Conclusion    The left ventricular systolic function is normal.  LV end diastolic pressure is normal.  The left ventricular ejection fraction is 50-55% by visual estimate.   1. Normal coronary anatomy 2. Overall normal LV function. Localized akinesis of the inferior apex 3. Normal LVEDP  Plan: medical management including optimization of BP control.       Patient Profile     57 y.o. female with hx of CAD (MI in 2006 with PTCA to LAD, Cath in 2011 with irreg LAD (? If initial event in 2006 was dissection that healed).   Echo 2013 normal by report.  Presented with CP that occurred while pt was vomiting, dizziness.  Assessment & Plan    1. Chest pain -Occurred with a spell of vomiting, BM, with diaphoresis, dizziness, flushing.  -Has hx of MI in 2006 with PTCA to LAD but cath in 2011 showed irreg LAD. Dr Harrington Challenger considered that the 2006 event may have possibly been SCAD and the artery healed.  -Troponins elevated to peak of 1.03 and trended down. BNP not elevated.  -LHC done on 07/19/17 revealed normal LV systolic function and normal coronary anatomy. Recommendation for optimization of BP control.  -Echo done yesterday showed moderate LVH with LVEF 50-55%, grade 1 DD and akinesis of the apical inferior wall. Mild AI and trace TR with mild pulmonary hypertension -No further chest pain.  -Blood pressure control will be key.  2. Hypertension, uncontrolled -Has had difficult to control HTN since age 80. WIll cehck renal artery [place].  -Home regimen includes losartan-HCTZ 100-12.5 mg daily.  -BP has been as high as >200/120.  -metoprolol 25 mg bid has been added as well as hydralazine 25 mg TID. She was getting labetalol prn.  -Yesterday Amlodipine 5 mg added.  -BP still elevated but improved, 154/92 this am.  -Low  sodium, heart healthy diet. I had an in depth discussion with her. Also advised beginning light  exercise once her BP is under control. Starting with 5 minute walk and working up slowly to 20-30 min per [place]. -Advised to stop Gingko Biloba and BC powders.  3. Hyperlpidemia -LDL 170 on Pravastatin 40 mg daily, pt admits that she was not taking it. I discussed the importance of lowering and she agrees that she will start taking the med and follow up in about 2 months to make sure it is working for her.   For questions or updates, please contact [place] Please consult [email or web address] for contact info under Cardiology/STEMI.      Signed, Daune Perch, NP  07/21/2017, 9:29 AM    Pt seen and examined earlier in [place]  Stable for discharge.   F/U in clinic as planned.  Dorris Carnes

## 2017-07-21 NOTE — Discharge Summary (Addendum)
Physician Discharge Summary  Hayley Miller OIN:[id number] DOB: Aug 17, 1960 DOA: 07/18/2017  PCP: Hayley Koch, MD  Admit date: 07/18/2017 Discharge date: 07/21/2017  Admitted From: Home Disposition: Home   Recommendations for Outpatient Follow-up:  1. Follow up with PCP in 1-2 weeks for BP management.  2. Please obtain BMP at follow up  Home Health: None Equipment/Devices: None Discharge Condition: STable CODE STATUS: Full Diet recommendation: Heart healthy  Brief/Interim Summary: Hayley Miller is a 57 y.o.femalewith medical history significant ofHTN, HLD, MI, CADs/pPCI,anxiety, anemia; who presents with compliants ofchest pain. This developed abruptly with chest tightness/pressure radiating to both arms upon waking. She also had nausea, 3 episodes of vomiting, diaphoresis, shortness of breath, and generalized weakness. She'd been taking goody powder TID for headaches in the past 3 days. On arrival to the ED she was hypertensive to 216/120. Labs revealed normal CBC, CO2 17, glucose 62,troponin 0.11.UDS was negative. CTA chest showed no PE, aortic aneurysm or dissection. She was admitted and troponin rose to 1.03, heparin IV started and cardiology consulted. ECG showed progressive T wave changes inferolaterally worrisome for ischemia. She experienced a transient vagal response w/BP down to 77/48, HR down to 47 which resolved spontaneously. Cardiac catheterization revealed patent coronaries 4/23. Blood pressure continued to be in the severe range so multiple medications were introduced stepwise in addition to home medications with improvement. She is stable and ready for discharge with need for further follow up for additional titration of BP medications.   Discharge Diagnoses:  Principal Problem:   NSTEMI (non-ST elevated myocardial infarction) [facility]) Active Problems:   CAD (coronary artery disease)   Chest pain   Hypertensive urgency   Hypocalcemia   Hypoglycemia  Chest  pain w/hx CAD s/p PTCA to LAD: Symptoms currently subsided. Patent coronaries on catheterization 4/23.  - Continue ASA, statin, beta blocker, NTG prn - Follow up with cardiology.  Resistant HTN with hypertensive urgency:  - Continue home Losartan 100mg  and HCTZ 12.5mg   - Started metoprolol 25mg  BID, hydralazine 25mg  TID, and norvasc 5mg .  - Would consider starting spironolactone if BP remains elevated at follow up.  Headache: Resolved with improvement in BP, thought to be hypertensive. - Treat headache symptomatically  Vasovagal episode: With vital signs documented during the episode and symptoms of perioral numbness, difficulty speaking and left lid droop which resolved spontaneously. No persistent deficits.  - No stroke work up is indicated.   Hypocalcemia: This could be due to hyperventilation. Resolved.   Hypoglycemia: Resolved. Cortisol ok.   Hypokalemia: Replace and recheck at follow up. Mg 1.9 - If persists, may need hyperaldo w/u   Discharge Instructions Discharge Instructions    Diet - low sodium heart healthy   Complete by:  As directed    Discharge instructions   Complete by:  As directed    You were admitted for symptoms that were due to very very elevated blood pressure. Fortunately your blood pressure has improved but it has taken multiple medications, all of which will need to be continued and have been prescribed, sent to [facility]  - Continue taking all medications you previously were taking with the exception of ginko biloba as this can worsen hypertension.  - Start taking norvasc, hydralazine, and metoprolol as directed - If your symptoms return, seek medical attention right away, otherwise follow up with your PCP and cardiologist in the next 1-2 weeks.   Increase activity slowly   Complete by:  As directed      Allergies  as of 07/21/2017      Reactions   Clonidine Hydrochloride Other (See Comments)   REACTION: FATIGUE   Codeine Other (See  Comments)   Unknown reaction (patient reports taking cough syrup with codeine)   Ibuprofen Other (See Comments)   GI bleed   Lovastatin Other (See Comments)   Join pain      Medication List    STOP taking these medications   GNP GINGKO BILOBA EXTRACT PO     TAKE these medications   amLODipine 5 MG tablet Commonly known as:  NORVASC Take 1 tablet (5 mg total) by mouth daily. Start taking on:  07/22/2017   aspirin EC 81 MG tablet Take 81 mg by mouth daily.   BC HEADACHE POWDER PO Take 1 packet by mouth 3 (three) times daily as needed (headache).   cetirizine 10 MG tablet Commonly known as:  ZYRTEC Take 1 tablet (10 mg total) by mouth daily.   Cod Liver Oil Caps Take 1 capsule by mouth daily.   desvenlafaxine 50 MG 24 hr tablet Commonly known as:  PRISTIQ Take 1 tablet (50 mg total) by mouth daily.   esomeprazole 40 MG capsule Commonly known as:  NEXIUM TAKE 1 CAPSULE AT 12:00 NOON EACH [place]. What changed:  See the new instructions.   gabapentin 300 MG capsule Commonly known as:  NEURONTIN TAKE (1) CAPSULE THREE TIMES DAILY. What changed:  See the new instructions.   hydrALAZINE 25 MG tablet Commonly known as:  APRESOLINE Take 1 tablet (25 mg total) by mouth every 8 (eight) hours.   HYDROcodone-homatropine 5-1.5 MG/5ML syrup Commonly known as:  HYCODAN Take 5 mLs by mouth every 8 (eight) hours as needed for cough.   levocetirizine 5 MG tablet Commonly known as:  XYZAL TAKE 1 TABLET ONCE DAILY IN THE EVENING.   losartan-hydrochlorothiazide 100-12.5 MG tablet Commonly known as:  HYZAAR Take 1 tablet by mouth daily.   MAGNESIUM PO Take 1 tablet by mouth daily.   metoprolol tartrate 25 MG tablet Commonly known as:  LOPRESSOR Take 1 tablet (25 mg total) by mouth 2 (two) times daily.   multivitamin with minerals Tabs tablet Take 1 tablet by mouth daily.   MUSCLE RUB 10-15 % Crea Apply 1 application topically daily as needed for muscle pain.    nitroGLYCERIN 0.4 MG SL tablet Commonly known as:  NITROSTAT Place 1 tablet (0.4 mg total) under the tongue every 5 (five) minutes as needed for chest pain.   ondansetron 4 MG tablet Commonly known as:  ZOFRAN Take 1 tablet (4 mg total) by mouth every 8 (eight) hours as needed for nausea or vomiting.   pravastatin 40 MG tablet Commonly known as:  PRAVACHOL Take 1 tablet (40 mg total) by mouth at bedtime. Annual appt is due in [place] see provider for future refills   VISINE OP Place 1 drop into both eyes daily as needed (dry eyes).      Follow-up Information    Fay Records, MD. Schedule an appointment as soon as possible for a visit.   Specialty:  Cardiology Contact information: [address] [place] [address] [place] [address] [phone]        Hayley Koch, MD. Schedule an appointment as soon as possible for a visit in 1 week(s).   Specialty:  Internal Medicine Contact information: [place] [address] [phone]          Allergies  Allergen Reactions  . Clonidine Hydrochloride Other (See  Comments)    REACTION: FATIGUE  . Codeine Other (See Comments)    Unknown reaction (patient reports taking cough syrup with codeine)  . Ibuprofen Other (See Comments)    GI bleed  . Lovastatin Other (See Comments)    Join pain    Consultations:  Cardiology  Procedures/Studies: Dg Chest 2 View  Result Date: 07/18/2017 CLINICAL DATA:  Chest pain since this morning EXAM: CHEST - 2 VIEW COMPARISON:  04/09/2012 FINDINGS: AP view of the chest. Borderline cardiomegaly with tortuous atherosclerotic aorta. No active pulmonary disease. No acute osseous abnormality. IMPRESSION: No active cardiopulmonary disease. Aortic atherosclerosis. Borderline cardiomegaly. Electronically Signed   By: Ashley Royalty M.D.   On: 07/18/2017 20:53   Ct Head Wo Contrast  Result Date: 07/18/2017 CLINICAL DATA:  Headache and weakness for 2 days. EXAM: CT HEAD WITHOUT  CONTRAST TECHNIQUE: Contiguous axial images were obtained from the base of the skull through the vertex without intravenous contrast. COMPARISON:  10/16/2014 FINDINGS: Brain: No evidence of acute infarction, hemorrhage, hydrocephalus, extra-axial collection or mass lesion/mass effect. Vascular: No hyperdense vessel or unexpected calcification. Skull: Normal. Negative for fracture or focal lesion. Sinuses/Orbits: Air-fluid level in the right maxillary sinus. Partial opacification of the ethmoid sinuses. Other: None. IMPRESSION: No acute intracranial abnormality. Right maxillary and ethmoid sinusitis. Electronically Signed   By: Fidela Salisbury M.D.   On: 07/18/2017 22:09   Ct Angio Chest/abd/pel For Dissection W And/or Wo Contrast  Result Date: 07/18/2017 CLINICAL DATA:  Chest pain radiating to the neck and arms EXAM: CT ANGIOGRAPHY CHEST, ABDOMEN AND PELVIS TECHNIQUE: Multidetector CT imaging through the chest, abdomen and pelvis was performed using the standard protocol during bolus administration of intravenous contrast. Multiplanar reconstructed images and MIPs were obtained and reviewed to evaluate the vascular anatomy. CONTRAST:  100 mL Isovue 370 intravenous COMPARISON:  Chest x-ray 07/18/2017 FINDINGS: CTA CHEST FINDINGS Cardiovascular: Non contrasted images of the chest demonstrate no intramural hematoma. Mild aortic atherosclerosis. No aneurysm. No dissection. Mild cardiomegaly. No significant pericardial effusion Mediastinum/Nodes: Midline trachea. Subcentimeter hypodense thyroid nodules. No significant mediastinal adenopathy. Small esophageal hiatal hernia. Lungs/Pleura: No acute consolidation or pleural effusion. 2 mm right middle lobe pulmonary nodule, series 9, image number 64. Musculoskeletal: No chest wall abnormality. No acute or significant osseous findings. Review of the MIP images confirms the above findings. CTA ABDOMEN AND PELVIS FINDINGS VASCULAR Aorta: Normal caliber aorta without  aneurysm, dissection, vasculitis or significant stenosis. Celiac: Patent without evidence of aneurysm, dissection, vasculitis or significant stenosis. Left gastric artery appears to arise from the aorta, it is origin is not well visualized. SMA: Patent without evidence of aneurysm, dissection, vasculitis or significant stenosis. Renals: Single left and 2 right renal arteries are patent. IMA: Patent without evidence of aneurysm, dissection, vasculitis or significant stenosis. Inflow: Patent without evidence of aneurysm, dissection, vasculitis or significant stenosis. Review of the MIP images confirms the above findings. NON-VASCULAR Hepatobiliary: No focal liver abnormality is seen. No gallstones, gallbladder wall thickening, or biliary dilatation. Pancreas: Unremarkable. No pancreatic ductal dilatation or surrounding inflammatory changes. Spleen: Normal in size without focal abnormality. Adrenals/Urinary Tract: Adrenal glands are unremarkable. Kidneys are normal, without renal calculi, focal lesion, or hydronephrosis. Bladder is unremarkable. Stomach/Bowel: Stomach is within normal limits. Appendix appears normal. No evidence of bowel wall thickening, distention, or inflammatory changes. Sigmoid colon diverticula without acute inflammation Lymphatic: No significantly enlarged lymph nodes Reproductive: Status post hysterectomy. No adnexal masses. Other: Negative for free air or free fluid. Small fat in the umbilical  region Musculoskeletal: No acute or significant osseous findings. Review of the MIP images confirms the above findings. IMPRESSION: 1. Negative for aortic aneurysm or dissection. 2. No significant vascular disease within the abdomen or pelvis. 3. 2 mm right middle lobe pulmonary nodule. No follow-up needed if patient is low-risk. Non-contrast chest CT can be considered in 12 months if patient is high-risk. This recommendation follows the consensus statement: Guidelines for Management of Incidental  Pulmonary Nodules Detected on CT Images: From the Fleischner Society 2017; Radiology 2017; 284:228-243. 4. No CT evidence for acute intra-abdominal or pelvic abnormality 5. Sigmoid colon diverticula without acute inflammation Electronically Signed   By: Donavan Foil M.D.   On: 07/18/2017 22:28    Echocardiogram 07/20/17 Study Conclusions - Left ventricle: The cavity size was normal. Wall thickness was increased in a pattern of moderate LVH. Systolic function was normal. The estimated ejection fraction was in the range of 50% to 55%. There is akinesis of the apicalinferior myocardium. Doppler parameters are consistent with abnormal left ventricular relaxation (grade 1 diastolic dysfunction). - Aortic valve: There was mild regurgitation. - Pulmonary arteries: Systolic pressure was mildly increased. PA peak pressure: 36 mm Hg (S).  Impressions: - Akinesis of the apical inferior wall with overall low normal LV systolic function; moderate LVH; mild diastolic dysfunction; mild AI; trace TR with mild pulmonary hypertension.   LEFT HEART CATH AND CORONARY ANGIOGRAPHY 07/19/17  Conclusion    The left ventricular systolic function is normal.  LV end diastolic pressure is normal.  The left ventricular ejection fraction is 50-55% by visual estimate.  1. Normal coronary anatomy 2. Overall normal LV function. Localized akinesis of the inferior apex 3. Normal LVEDP  Plan: medical management including optimization of BP control.       Subjective: Headache gone, walking the halls wants to go home. No chest pain, dyspnea. Right wrist is sore but no new ecchymosis, erythema, swelling.   Discharge Exam: Vitals:   07/21/17 0944 07/21/17 0950  BP: (!) 177/122 (!) 165/114  Pulse: 89 80  Resp: 18   Temp:    SpO2: 96%    General: Pt is alert, awake, not in acute distress Cardiovascular: RRR, S1/S2 +, no rubs, no gallops Respiratory: CTA bilaterally, no wheezing, no  rhonchi Abdominal: Soft, NT, ND, bowel sounds + Extremities: No edema, no cyanosis  Labs: BNP (last 3 results) Recent Labs    07/18/17 2234  BNP 76.1   Basic Metabolic Panel: Recent Labs  Lab 07/18/17 1937 07/19/17 0904 07/19/17 1302 07/20/17 0558 07/21/17 0412  NA 145 134* 140 138 138  K 3.7 5.2* 4.1 3.1* 3.4*  CL 122* 101 104 103 103  CO2 17* 20* 25 23 25   GLUCOSE 62* 74 95 112* 90  BUN 16 14 15 13 19   CREATININE 0.68 0.94 0.96 0.96 1.13*  CALCIUM 5.7* 9.4 10.1 10.2 10.1  MG  --  1.8  --  1.9  --    Liver Function Tests: Recent Labs  Lab 07/18/17 1937 07/19/17 1302 07/20/17 0558  AST 45* 37 38  ALT 12* 13* 16  ALKPHOS 34* 55 61  BILITOT 1.0 0.9 0.7  PROT 3.7* 7.0 7.2  ALBUMIN 2.2* 3.9 4.0   Recent Labs  Lab 07/18/17 1937  LIPASE 23   No results for input(s): AMMONIA in the last 168 hours. CBC: Recent Labs  Lab 07/18/17 2234 07/19/17 0904 07/20/17 0558  WBC 8.6 8.1 8.4  NEUTROABS 5.8 5.1  --   HGB 15.1* 14.5  14.8  HCT 44.8 44.2 43.7  MCV 94.1 94.0 93.6  PLT 367 329 372   Cardiac Enzymes: Recent Labs  Lab 07/18/17 1937 07/19/17 0247 07/19/17 0558 07/19/17 0904  TROPONINI 0.11* 1.03* 0.91* 0.74*   BNP: Invalid input(s): POCBNP CBG: Recent Labs  Lab 07/18/17 2253 07/19/17 0256 07/19/17 0555 07/19/17 0742  GLUCAP 117* 91 85 84   D-Dimer No results for input(s): DDIMER in the last 72 hours. Hgb A1c No results for input(s): HGBA1C in the last 72 hours. Lipid Profile Recent Labs    07/19/17 0904  CHOL 240*  HDL 49  LDLCALC 170*  TRIG 105  CHOLHDL 4.9   Thyroid function studies No results for input(s): TSH, T4TOTAL, T3FREE, THYROIDAB in the last 72 hours.  Invalid input(s): FREET3 Anemia work up No results for input(s): VITAMINB12, FOLATE, FERRITIN, TIBC, IRON, RETICCTPCT in the last 72 hours. Urinalysis    Component Value Date/Time   COLORURINE YELLOW 09/03/2008 2133   APPEARANCEUR CLOUDY (A) 09/03/2008 2133   LABSPEC  1.024 09/03/2008 2133   PHURINE 7.5 09/03/2008 2133   GLUCOSEU NEGATIVE 09/03/2008 2133   GLUCOSEU NEGATIVE 07/03/2008 1527   HGBUR SMALL (A) 09/03/2008 2133   BILIRUBINUR NEGATIVE 09/03/2008 2133   KETONESUR 15 (A) 09/03/2008 2133   PROTEINUR NEGATIVE 09/03/2008 2133   UROBILINOGEN 0.2 09/03/2008 2133   NITRITE NEGATIVE 09/03/2008 2133   LEUKOCYTESUR NEGATIVE 09/03/2008 2133    Microbiology No results found for this or any previous visit (from the past 240 hour(s)).  Time coordinating discharge: Approximately 40 minutes  Vance Gather, MD  [facility] 07/21/2017, 11:19 AM Pager [phone]

## 2017-07-22 LAB — CALCIUM, IONIZED: CALCIUM, IONIZED, SERUM: 5.2 mg/dL (ref 4.5–5.6)

## 2017-07-25 ENCOUNTER — Ambulatory Visit (INDEPENDENT_AMBULATORY_CARE_PROVIDER_SITE_OTHER): Payer: Medicare Other | Admitting: Internal Medicine

## 2017-07-25 ENCOUNTER — Encounter: Payer: Self-pay | Admitting: Internal Medicine

## 2017-07-25 DIAGNOSIS — J0111 Acute recurrent frontal sinusitis: Secondary | ICD-10-CM

## 2017-07-25 DIAGNOSIS — I214 Non-ST elevation (NSTEMI) myocardial infarction: Secondary | ICD-10-CM

## 2017-07-25 DIAGNOSIS — R079 Chest pain, unspecified: Secondary | ICD-10-CM | POA: Diagnosis not present

## 2017-07-25 DIAGNOSIS — I160 Hypertensive urgency: Secondary | ICD-10-CM

## 2017-07-25 MED ORDER — DOXYCYCLINE HYCLATE 100 MG PO TABS: 100.0000 mg | ORAL_TABLET | Freq: Two times a day (BID) | ORAL | 0 refills | Status: DC

## 2017-07-25 NOTE — Assessment & Plan Note (Signed)
Suspect she is still running high BP at home. Asked her to increase hydralazine to TID during the [place] and not every 8 hours. Continue metoprolol 25 mg BID and amlodipine 5 mg daily and losartan/hctz 100/12.5 mg. She does have room to increase current medications if needed. Needs recheck in 1 month.

## 2017-07-25 NOTE — Assessment & Plan Note (Signed)
Sinus air fluid levels on the right and she is symptomatic from this. Rx for doxycycline.

## 2017-07-25 NOTE — Assessment & Plan Note (Signed)
Still with high blood pressure and symptoms intermittent. She had recent cath with normal coronaries. She is reminded about the serious nature of her health and she is taking metoprolol and amlodipine and hydralazine and losartan/hctz. There is room to increase with her current dosing.

## 2017-07-25 NOTE — Assessment & Plan Note (Signed)
Likely due to hypertensive urgency. She has had several MI in the past under similar circumstances. She was not aware that she had MI this hospital stay.

## 2017-07-25 NOTE — Progress Notes (Signed)
   Subjective:    Patient ID: Hayley Miller, female    DOB: 12-24-1960, 57 y.o.   MRN: [id number]  HPI The patient is a 57 YO female coming in for hospital follow up ( NSTEMI related to blood pressure, treated for heart attack, cath with non-obstructive). She has had some chest pains since leaving the hospital. She is still having headaches. She has filled all the new blood pressure medicines. No one told her she could take hydralazine 3 times during the daytime and she has been trying to wake up at night to take the 3rd does. Denies fevers or chills. Still having sinus problems and some sinusitis on CT scan which was not treated. Some bruising still from the hospital.   Review of Systems  Constitutional: Positive for fatigue. Negative for activity change, appetite change and diaphoresis.  HENT: Negative.   Eyes: Negative.   Respiratory: Negative for cough, chest tightness and shortness of breath.   Cardiovascular: Positive for chest pain. Negative for palpitations and leg swelling.  Gastrointestinal: Negative for abdominal distention, abdominal pain, constipation, diarrhea, nausea and vomiting.  Musculoskeletal: Negative.   Skin: Negative.   Neurological: Positive for numbness and headaches. Negative for dizziness, tremors, syncope, facial asymmetry, weakness and light-headedness.  Psychiatric/Behavioral: Negative.       Objective:   Physical Exam  Constitutional: She is oriented to person, place, and time. She appears well-developed and well-nourished.  HENT:  Head: Normocephalic and atraumatic.  Eyes: EOM are normal.  Neck: Normal range of motion.  Cardiovascular: Normal rate and regular rhythm.  Pulmonary/Chest: Effort normal and breath sounds normal. No respiratory distress. She has no wheezes. She has no rales.  Abdominal: Soft. Bowel sounds are normal. She exhibits no distension. There is no tenderness. There is no rebound.  Musculoskeletal: She exhibits no edema.  Neurological:  She is alert and oriented to person, place, and time. Coordination normal.  Skin: Skin is warm and dry.  Psychiatric: She has a normal mood and affect.   Vitals:   07/25/17 1409 07/25/17 1444  BP: (!) 130/98 (!) 140/96  Pulse: 70   Temp: (!) 97.5 F (36.4 C)   TempSrc: Oral   SpO2: 97%   Weight: 169 lb (76.7 kg)   Height: 5\' 4"  (1.626 m)       Assessment & Plan:

## 2017-07-25 NOTE — Patient Instructions (Signed)
We have sent in doxycycline to take 1 pill twice a [place] for 1 week.  Keep taking the blood pressure medicines. It is okay to take the hydralazine 3 times per [place] and not in the middle of the night.

## 2017-07-26 ENCOUNTER — Ambulatory Visit (INDEPENDENT_AMBULATORY_CARE_PROVIDER_SITE_OTHER): Payer: Medicare Other | Admitting: Physician Assistant

## 2017-07-26 ENCOUNTER — Encounter: Payer: Self-pay | Admitting: Physician Assistant

## 2017-07-26 VITALS — BP 182/120 | HR 84 | Ht 64.0 in | Wt 169.0 lb

## 2017-07-26 DIAGNOSIS — T148XXA Other injury of unspecified body region, initial encounter: Secondary | ICD-10-CM

## 2017-07-26 DIAGNOSIS — I2510 Atherosclerotic heart disease of native coronary artery without angina pectoris: Secondary | ICD-10-CM | POA: Diagnosis not present

## 2017-07-26 DIAGNOSIS — I10 Essential (primary) hypertension: Secondary | ICD-10-CM

## 2017-07-26 LAB — CBC
Hematocrit: 44.3 % (ref 34.0–46.6)
Hemoglobin: 15 g/dL (ref 11.1–15.9)
MCH: 31.7 pg (ref 26.6–33.0)
MCHC: 33.9 g/dL (ref 31.5–35.7)
MCV: 94 fL (ref 79–97)
PLATELETS: 376 10*3/uL (ref 150–379)
RBC: 4.73 x10E6/uL (ref 3.77–5.28)
RDW: 13.2 % (ref 12.3–15.4)
WBC: 6.4 10*3/uL (ref 3.4–10.8)

## 2017-07-26 NOTE — Progress Notes (Signed)
Cardiology Office Note    Date:  07/26/2017   ID:  Hayley Miller, DOB Dec 30, 1960, MRN [id number]  PCP:  Hoyt Koch, MD  Cardiologist:  Dr. Harrington Challenger   Chief Complaint: Hospital follow up s/p cath   History of Present Illness:   Hayley Miller is a 57 y.o. female  with hx of CAD (MI in 2006 with PTCA to LAD, HTN, HLD presents for follow up.   Admitted 06/2017 with chest pain. Troponin peaked at 1.03. LHC done on 07/19/17 revealed normal LV systolic function and normal coronary anatomy. Recommendation for optimization of BP control. Echo showed moderate LVH with LVEF 50-55%, grade 1 DD and akinesis of the apical inferior wall. Mild AI and trace TR with mild pulmonary hypertension  Here today for follow up.  Her blood pressure was 140/96 and 130/98 during PCP visit yesterday.  Encouraged to take hydralazine 3 times per [place] during the daytime.  She was very upset and stressed out with her son this morning.  Blood pressure 182/120 initially with repeat check 142/99.  Encouraged to get blood pressure machine and check regularly.  She has also noted extending right radial ecchymosis.  She denies recurrent chest pain, shortness of breath orthopnea, PND, syncope, edema or melena.  And does compliant with medication.  Past Medical History:  Diagnosis Date  . Anemia   . Anxiety   . CAD (coronary artery disease)    a. s/p AL MI in 2006 tx with PTCA to LAD;  b. LHC (10/2009): lum irregs in LAD, o/w no CAD, EF 55-60%;  c. Myoview (07/2011):  no ischemia, EF 63%;  d. Echo (11/2011):  mild LVH, EF 60-65%, Gr 1 DD;  e.  Lexiscan Myoview (04/2013):  Inferior bowel atten artifact with inferoapical defect; no ischemia; EF 58% (low risk)  . Cameron lesion, acute   . Chronic lower back pain   . Chronic pain syndrome   . Daily headache   . Depression   . Erosive esophagitis   . Fibromyalgia   . GERD (gastroesophageal reflux disease)   . Hemorrhage    upper gastrointestional. GI bleed 10/09 due to NSAID  .  Hiatal hernia   . HLD (hyperlipidemia)   . HTN (hypertension)   . Myocardial infarction ([place]) 2006 X 2  . Rhinitis   . Seasonal allergies     Past Surgical History:  Procedure Laterality Date  . BLADDER SUSPENSION N/A 05/08/2013   Procedure: TRANSVAGINAL TAPE (TVT) PROCEDURE;  Surgeon: Olga Millers, MD;  Location: [facility];  Service: Gynecology;  Laterality: N/A;  . BREAST CYST EXCISION Left 2002  . CARDIAC CATHETERIZATION  07/19/2017  . CORONARY ANGIOPLASTY  2006  . CYSTOSCOPY N/A 05/08/2013   Procedure: CYSTOSCOPY;  Surgeon: Olga Millers, MD;  Location: [facility];  Service: Gynecology;  Laterality: N/A;  . LAPAROSCOPIC ASSISTED VAGINAL HYSTERECTOMY N/A 05/08/2013   Procedure: LAPAROSCOPIC ASSISTED VAGINAL HYSTERECTOMY;  Surgeon: Olga Millers, MD;  Location: [facility];  Service: Gynecology;  Laterality: N/A;  . LAPAROSCOPIC BILATERAL SALPINGO OOPHERECTOMY N/A 05/08/2013   Procedure: LAPAROSCOPIC BILATERAL SALPINGO OOPHORECTOMY;  Surgeon: Olga Millers, MD;  Location: [facility];  Service: Gynecology;  Laterality: N/A;  . LEFT HEART CATH AND CORONARY ANGIOGRAPHY N/A 07/19/2017   Procedure: LEFT HEART CATH AND CORONARY ANGIOGRAPHY;  Surgeon: Martinique, Peter M, MD;  Location: [facility];  Service: Cardiovascular;  Laterality: N/A;  . UPPER GASTROINTESTINAL ENDOSCOPY      Current Medications: Prior to  Admission medications   Medication Sig Start Date End Date Taking? Authorizing Provider  amLODipine (NORVASC) 5 MG tablet Take 1 tablet (5 mg total) by mouth daily. 07/22/17   Patrecia Pour, MD  aspirin EC 81 MG tablet Take 81 mg by mouth daily.    [provider]  Aspirin-Salicylamide-Caffeine (BC HEADACHE POWDER PO) Take 1 packet by mouth 3 (three) times daily as needed (headache).    [provider]  cetirizine (ZYRTEC) 10 MG tablet Take 1 tablet (10 mg total) by mouth daily. 04/29/17   Hoyt Koch, MD  Cod Liver Oil CAPS Take 1 capsule by mouth daily.     [provider]  doxycycline (VIBRA-TABS) 100 MG tablet Take 1 tablet (100 mg total) by mouth 2 (two) times daily. 07/25/17   Hoyt Koch, MD  esomeprazole (NEXIUM) 40 MG capsule TAKE 1 CAPSULE AT 12:00 NOON EACH [place]. Patient taking differently: TAKE 1 CAPSULE BY MOUTH DAILY 05/18/17   Hoyt Koch, MD  gabapentin (NEURONTIN) 300 MG capsule TAKE (1) CAPSULE THREE TIMES DAILY. Patient taking differently: TAKE (1) CAPSULE BY MOUTH THREE TIMES DAILY AS NEEDED FOR PAIN 03/01/17   Hoyt Koch, MD  hydrALAZINE (APRESOLINE) 25 MG tablet Take 1 tablet (25 mg total) by mouth every 8 (eight) hours. 07/21/17   Patrecia Pour, MD  levocetirizine (XYZAL) 5 MG tablet TAKE 1 TABLET ONCE DAILY IN THE EVENING. Patient not taking: Reported on 12/28/2016 07/05/16   Hoyt Koch, MD  losartan-hydrochlorothiazide [facility]) 100-12.5 MG tablet Take 1 tablet by mouth daily. 08/30/16   Fay Records, MD  MAGNESIUM PO Take 1 tablet by mouth daily.    [provider]  Menthol-Methyl Salicylate (MUSCLE RUB) 10-15 % CREA Apply 1 application topically daily as needed for muscle pain.    [provider]  metoprolol tartrate (LOPRESSOR) 25 MG tablet Take 1 tablet (25 mg total) by mouth 2 (two) times daily. 07/21/17   Patrecia Pour, MD  Multiple Vitamin (MULTIVITAMIN WITH MINERALS) TABS tablet Take 1 tablet by mouth daily.    [provider]  nitroGLYCERIN (NITROSTAT) 0.4 MG SL tablet Place 1 tablet (0.4 mg total) under the tongue every 5 (five) minutes as needed for chest pain. 04/29/17   Hoyt Koch, MD  ondansetron (ZOFRAN) 4 MG tablet Take 1 tablet (4 mg total) by mouth every 8 (eight) hours as needed for nausea or vomiting. 04/29/17   Hoyt Koch, MD  pravastatin (PRAVACHOL) 40 MG tablet Take 1 tablet (40 mg total) by mouth at bedtime. Annual appt is due in Mus see provider for future refills 11/26/16   Hoyt Koch, MD  Tetrahydrozoline HCl  (VISINE OP) Place 1 drop into both eyes daily as needed (dry eyes).    [provider]    Allergies:   Clonidine hydrochloride; Codeine; Ibuprofen; and Lovastatin   Social History   Socioeconomic History  . Marital status: Divorced    Spouse name: Not on file  . Number of children: 1  . Years of education: Not on file  . Highest education level: Not on file  Occupational History  . Occupation: unemployed    Fish farm manager: UNEMPLOYED  Social Needs  . Financial resource strain: Not on file  . Food insecurity:    Worry: Not on file    Inability: Not on file  . Transportation needs:    Medical: Not on file    Non-medical: Not on file  Tobacco Use  .  Smoking status: Former Smoker    Packs/[place]: 1.00    Years: 3.00    Pack years: 3.00    Types: Cigarettes    Last attempt to quit: 04/17/2017    Years since quitting: 0.2  . Smokeless tobacco: Never Used  . Tobacco comment: Divorced, lives with female domestic partner  Substance and Sexual Activity  . Alcohol use: Yes    Comment: 07/20/2017 "might drink once or twice/year"  . Drug use: No  . Sexual activity: Yes    Birth control/protection: Other-see comments    Comment: has a female domestic partner  Lifestyle  . Physical activity:    Days per week: Not on file    Minutes per session: Not on file  . Stress: Not on file  Relationships  . Social connections:    Talks on phone: Not on file    Gets together: Not on file    Attends religious service: Not on file    Active member of club or organization: Not on file    Attends meetings of clubs or organizations: Not on file    Relationship status: Not on file  Other Topics Concern  . Not on file  Social History Narrative   Lives alone - divorced; 1 child.    Used to live with female domestic partner   Unemployed - disability.            Family History:  The patient's family history includes Colon cancer in her maternal aunt; Colon polyps in her mother; Diabetes in  her maternal aunt; Heart disease in her maternal grandmother and son; Heart murmur in her mother; Hypertension in her mother.   ROS:   Please see the history of present illness.    ROS All other systems reviewed and are negative.   PHYSICAL EXAM:   VS:  BP (!) 182/120   Pulse 84   Ht 5\' 4"  (1.626 m)   Wt 169 lb (76.7 kg)   LMP 06/07/2012   BMI 29.01 kg/m    GEN: Well nourished, well developed, in no acute distress  HEENT: normal  Neck: no JVD, carotid bruits, or masses Cardiac: RRR; no murmurs, rubs, or gallops,no edema.  Small right radial hematoma with extensive ecchymosis. Respiratory:  clear to auscultation bilaterally, normal work of breathing GI: soft, nontender, nondistended, + BS, ecchymosis at lovenox site on her belly MS: no deformity or atrophy  Skin: warm and dry, no rash Neuro:  Alert and Oriented x 3, Strength and sensation are intact Psych: euthymic mood, full affect  Wt Readings from Last 3 Encounters:  07/26/17 169 lb (76.7 kg)  07/25/17 169 lb (76.7 kg)  07/21/17 164 lb 3.2 oz (74.5 kg)      Studies/Labs Reviewed:   EKG:  EKG is not ordered today.    Recent Labs: 08/30/2016: TSH 1.540 07/18/2017: B Natriuretic Peptide 62.0 07/20/2017: ALT 16; Hemoglobin 14.8; Magnesium 1.9; Platelets 372 07/21/2017: BUN 19; Creatinine, Ser 1.13; Potassium 3.4; Sodium 138   Lipid Panel    Component Value Date/Time   CHOL 240 (H) 07/19/2017 0904   TRIG 105 07/19/2017 0904   TRIG 444 11/14/2009   HDL 49 07/19/2017 0904   CHOLHDL 4.9 07/19/2017 0904   VLDL 21 07/19/2017 0904   LDLCALC 170 (H) 07/19/2017 0904   LDLDIRECT 105.0 10/02/2015 1628    Additional studies/ records that were reviewed today include:   Echocardiogram 07/20/17 Study Conclusions - Left ventricle: The cavity size was normal. Wall thickness was increased  in a pattern of moderate LVH. Systolic function was normal. The estimated ejection fraction was in the range of 50% to 55%. There is  akinesis of the apicalinferior myocardium. Doppler parameters are consistent with abnormal left ventricular relaxation (grade 1 diastolic dysfunction). - Aortic valve: There was mild regurgitation. - Pulmonary arteries: Systolic pressure was mildly increased. PA peak pressure: 36 mm Hg (S).  Impressions: - Akinesis of the apical inferior wall with overall low normal LV systolic function; moderate LVH; mild diastolic dysfunction; mild AI; trace TR with mild pulmonary hypertension.   LEFT HEART CATH AND CORONARY ANGIOGRAPHY 07/19/17  Conclusion    The left ventricular systolic function is normal.  LV end diastolic pressure is normal.  The left ventricular ejection fraction is 50-55% by visual estimate.  1. Normal coronary anatomy 2. Overall normal LV function. Localized akinesis of the inferior apex 3. Normal LVEDP  Plan: medical management including optimization of BP control.       ASSESSMENT & PLAN:    1. CAD s/p  PTCA to LAD in 2006 - Normal coronaries by cath 06/2017.  Stop aspirin for few weeks as below.  2. HTN -Elevated blood pressure initially with repeat check showed significant improvement, likely due to stress.  Encouraged to keep a log.  She has a follow-up with PCP. No change in regimen made today.   3. HLD -Continue statin.  4.  Right radial hematoma with ecchymosis -Extending bruising per patient.  No bruit heard.  Will stop aspirin for 2 weeks and then restart.  She will let us know if worsening of her symptoms.  Check CBC today.   Medication Adjustments/Labs and Tests Ordered: Current medicines are reviewed at length with the patient today.  Concerns regarding medicines are outlined above.  Medication changes, Labs and Tests ordered today are listed in the Patient Instructions below. Patient Instructions  Medication Instructions: Your physician has recommended you make the following change in your medication:  STOP: ASPIRIN 81 MG    Labwork: TODAY: CBC  Procedures/Testing: None Ordred  Follow-Up:Your physician wants you to follow-up in: 6 months with Dr.Cooper You will receive a reminder letter in the mail two months in advance. If you don't receive a letter, please call our office to schedule the follow-up appointment.    Any Additional Special Instructions Will Be Listed Below (If Applicable).  DO NOT LIFT ANYTHING OVER 5 LBS , CALL OUR OFFICE IN 2 WEEKS IF YOUR HAND HAS NOT IMPROVED.    If you need a refill on your cardiac medications before your next appointment, please call your pharmacy.      Jarrett Soho, [place]  07/26/2017 9:05 AM    [address], [place], [place]  [address] Phone: [phone]; Fax: [phone]

## 2017-07-26 NOTE — Patient Instructions (Addendum)
Medication Instructions: Your physician has recommended you make the following change in your medication:  STOP: ASPIRIN 81 MG   Labwork: TODAY: CBC  Procedures/Testing: None Ordred  Follow-Up:Your physician wants you to follow-up in: 6 months with Dr.Ross You will receive a reminder letter in the mail two months in advance. If you don't receive a letter, please call our office to schedule the follow-up appointment.    Any Additional Special Instructions Will Be Listed Below (If Applicable).  DO NOT LIFT ANYTHING OVER 5 LBS FOR 2 WEEKS, CALL OUR OFFICE IN 2 WEEKS IF YOUR HAND HAS NOT IMPROVED.    If you need a refill on your cardiac medications before your next appointment, please call your pharmacy.

## 2017-07-28 ENCOUNTER — Other Ambulatory Visit: Payer: Self-pay | Admitting: Internal Medicine

## 2017-08-01 ENCOUNTER — Other Ambulatory Visit: Payer: Self-pay | Admitting: Internal Medicine

## 2017-08-08 ENCOUNTER — Other Ambulatory Visit: Payer: Self-pay | Admitting: Internal Medicine

## 2017-08-12 ENCOUNTER — Other Ambulatory Visit: Payer: Self-pay

## 2017-08-12 MED ORDER — PRAVASTATIN SODIUM 40 MG PO TABS: 40.0000 mg | ORAL_TABLET | Freq: Every day | ORAL | 1 refills | Status: DC

## 2017-08-16 ENCOUNTER — Encounter: Payer: Self-pay | Admitting: *Deleted

## 2017-08-17 ENCOUNTER — Other Ambulatory Visit: Payer: Self-pay | Admitting: Internal Medicine

## 2017-08-21 ENCOUNTER — Encounter (HOSPITAL_COMMUNITY): Payer: Self-pay | Admitting: Emergency Medicine

## 2017-08-21 ENCOUNTER — Emergency Department (HOSPITAL_COMMUNITY)
Admission: EM | Admit: 2017-08-21 | Discharge: 2017-08-21 | Disposition: A | Discharge status: Home / Self Care | Payer: Medicare Other | Attending: Emergency Medicine | Admitting: Emergency Medicine

## 2017-08-21 DIAGNOSIS — R55 Syncope and collapse: Secondary | ICD-10-CM | POA: Insufficient documentation

## 2017-08-21 DIAGNOSIS — Z79899 Other long term (current) drug therapy: Secondary | ICD-10-CM | POA: Insufficient documentation

## 2017-08-21 DIAGNOSIS — Z87891 Personal history of nicotine dependence: Secondary | ICD-10-CM | POA: Diagnosis not present

## 2017-08-21 DIAGNOSIS — I2510 Atherosclerotic heart disease of native coronary artery without angina pectoris: Secondary | ICD-10-CM | POA: Diagnosis not present

## 2017-08-21 DIAGNOSIS — I10 Essential (primary) hypertension: Secondary | ICD-10-CM | POA: Insufficient documentation

## 2017-08-21 DIAGNOSIS — R531 Weakness: Secondary | ICD-10-CM | POA: Diagnosis not present

## 2017-08-21 DIAGNOSIS — Z7982 Long term (current) use of aspirin: Secondary | ICD-10-CM | POA: Insufficient documentation

## 2017-08-21 LAB — CBC
HCT: 43.3 % (ref 36.0–46.0)
Hemoglobin: 14.8 g/dL (ref 12.0–15.0)
MCH: 31.8 pg (ref 26.0–34.0)
MCHC: 34.2 g/dL (ref 30.0–36.0)
MCV: 93.1 fL (ref 78.0–100.0)
Platelets: 317 10*3/uL (ref 150–400)
RBC: 4.65 MIL/uL (ref 3.87–5.11)
RDW: 13.1 % (ref 11.5–15.5)
WBC: 9.4 10*3/uL (ref 4.0–10.5)

## 2017-08-21 LAB — D-DIMER, QUANTITATIVE: D-Dimer, Quant: <0.27 ug/mL-FEU (ref 0.00–0.50)

## 2017-08-21 LAB — I-STAT TROPONIN, ED
Troponin i, poc: 0.01 ng/mL (ref 0.00–0.08)
Troponin i, poc: 0 ng/mL (ref 0.00–0.08)

## 2017-08-21 LAB — BASIC METABOLIC PANEL
ANION GAP: 15 (ref 5–15)
BUN: 20 mg/dL (ref 6–20)
CALCIUM: 10.1 mg/dL (ref 8.9–10.3)
CO2: 21 mmol/L — ABNORMAL LOW (ref 22–32)
Chloride: 103 mmol/L (ref 101–111)
Creatinine, Ser: 1.37 mg/dL — ABNORMAL HIGH (ref 0.44–1.00)
GFR calc Af Amer: 49 mL/min — ABNORMAL LOW (ref >60)
GFR, EST NON AFRICAN AMERICAN: 43 mL/min — AB (ref >60)
GLUCOSE: 125 mg/dL — AB (ref 65–99)
Potassium: 3.2 mmol/L — ABNORMAL LOW (ref 3.5–5.1)
Sodium: 139 mmol/L (ref 135–145)

## 2017-08-21 LAB — CBG MONITORING, ED: GLUCOSE-CAPILLARY: 125 mg/dL — AB (ref 65–99)

## 2017-08-21 LAB — I-STAT BETA HCG BLOOD, ED (MC, WL, AP ONLY): I-stat hCG, quantitative: <5 m[IU]/mL (ref <5)

## 2017-08-21 MED ORDER — METOPROLOL TARTRATE 25 MG PO TABS
25.0000 mg | ORAL_TABLET | Freq: Once | ORAL | Status: AC
  Administered 2017-08-21: 25 mg via ORAL
  Filled 2017-08-21: qty 1

## 2017-08-21 MED ORDER — POTASSIUM CHLORIDE CRYS ER 20 MEQ PO TBCR
40.0000 meq | EXTENDED_RELEASE_TABLET | Freq: Once | ORAL | Status: AC
  Administered 2017-08-21: 40 meq via ORAL
  Filled 2017-08-21: qty 2

## 2017-08-21 MED ORDER — HYDRALAZINE HCL 25 MG PO TABS
25.0000 mg | ORAL_TABLET | Freq: Once | ORAL | Status: AC
  Administered 2017-08-21: 25 mg via ORAL
  Filled 2017-08-21: qty 1

## 2017-08-21 MED ORDER — AMLODIPINE BESYLATE 5 MG PO TABS
5.0000 mg | ORAL_TABLET | Freq: Once | ORAL | Status: AC
  Administered 2017-08-21: 5 mg via ORAL
  Filled 2017-08-21: qty 1

## 2017-08-21 NOTE — ED Notes (Signed)
Bed: RA30 Expected date:  Expected time:  Means of arrival:  Comments: Triage pt

## 2017-08-21 NOTE — Discharge Instructions (Signed)
It was my pleasure taking care of you today!   Please follow up with your cardiologist.   Return to ER for new or worsening symptoms, any additional concerns.

## 2017-08-21 NOTE — ED Triage Notes (Signed)
Pt reports she was at gym working out when she got dizzy and past out. Pt diaphoretic when pulled out of car. Pt recently released from hospital after having MI.

## 2017-08-21 NOTE — ED Notes (Signed)
Pt. Ambulated with with no assist. Pt. Walked from bed out in the hallway. Pt. Stated that she was not dizzy nor light headed. Nurse aware.

## 2017-08-21 NOTE — ED Notes (Signed)
She continues to be pain-free. She has ben given her usual meds/dosages per her request.

## 2017-08-21 NOTE — ED Provider Notes (Signed)
[facility] DEPT Provider Note   CSN: [id number] Arrival date & time: 08/21/17  0841     History   Chief Complaint Chief Complaint  Patient presents with  . Loss of Consciousness    HPI Hayley Miller is a 57 y.o. female.  The history is provided by the patient and medical records. No language interpreter was used.   Promise Weldin Miller is a 57 y.o. female  with a PMH as listed below who presents to the Emergency Department complaining of near syncopal episode today while at the gym. Patient with recent hospital admission from 4/22-4/25 for NSTEMI. She did undergo cardiac catheterization on 4/23 showing normal coronary anatomy and overall normal LV function with localized akinesis of the inferior apex. EF of 50-55%. Patient states that she was told at last cards appointment to start doing small work outs. She was instructed to walk 5 minutes on treadmill and 5 minutes on the bike. Today, she did go to the gym and did the above exercises. She felt okay, so she did 5 minutes on the elliptical as well. She then tried to do some light weights. While doing this, she suddenly started sweating. She felt light-headed with some blurry vision like she was going to pass out. She denies any room-spinning dizziness - just felt off balance like she needed to sit. She got to her car and sat down. Started feeling somewhat better. Never had any chest pain or shortness of breath. No medications taken prior to arrival for symptoms. Currently, feels a little weak, but otherwise no complaints.   Past Medical History:  Diagnosis Date  . Anemia   . Anxiety   . CAD (coronary artery disease)    a. s/p AL MI in 2006 tx with PTCA to LAD;  b. LHC (10/2009): lum irregs in LAD, o/w no CAD, EF 55-60%;  c. Myoview (07/2011):  no ischemia, EF 63%;  d. Echo (11/2011):  mild LVH, EF 60-65%, Gr 1 DD;  e.  Lexiscan Myoview (04/2013):  Inferior bowel atten artifact with inferoapical defect; no ischemia; EF  58% (low risk)  . Cameron lesion, acute   . Chronic lower back pain   . Chronic pain syndrome   . Daily headache   . Depression   . Erosive esophagitis   . Fibromyalgia   . GERD (gastroesophageal reflux disease)   . Hemorrhage    upper gastrointestional. GI bleed 10/09 due to NSAID  . Hiatal hernia   . HLD (hyperlipidemia)   . HTN (hypertension)   . Myocardial infarction ([place]) 2006 X 2  . Rhinitis   . Seasonal allergies     Patient Active Problem List   Diagnosis Date Noted  . Chest pain 07/19/2017  . Hypertensive urgency 07/19/2017  . Hypocalcemia 07/19/2017  . Hypoglycemia 07/19/2017  . Bleeding nose 04/29/2017  . Other forms of angina pectoris ([place]) 06/02/2016  . Acute sinusitis 10/03/2015  . Syncope 03/11/2014  . Tinnitus 03/11/2014  . Fibromyalgia 04/18/2009  . NSTEMI (non-ST elevated myocardial infarction) ([place]) 04/15/2009  . LOW BACK PAIN 01/12/2008  . Depression 07/27/2007  . Hyperlipidemia 06/27/2007  . Severe uncontrolled hypertension 06/27/2007  . CAD (coronary artery disease) 06/27/2007  . GERD 06/27/2007    Past Surgical History:  Procedure Laterality Date  . BLADDER SUSPENSION N/A 05/08/2013   Procedure: TRANSVAGINAL TAPE (TVT) PROCEDURE;  Surgeon: Olga Millers, MD;  Location: [facility];  Service: Gynecology;  Laterality: N/A;  . BREAST CYST EXCISION Left 2002  .  CARDIAC CATHETERIZATION  07/19/2017  . CORONARY ANGIOPLASTY  2006  . CYSTOSCOPY N/A 05/08/2013   Procedure: CYSTOSCOPY;  Surgeon: Olga Millers, MD;  Location: [facility];  Service: Gynecology;  Laterality: N/A;  . LAPAROSCOPIC ASSISTED VAGINAL HYSTERECTOMY N/A 05/08/2013   Procedure: LAPAROSCOPIC ASSISTED VAGINAL HYSTERECTOMY;  Surgeon: Olga Millers, MD;  Location: [facility];  Service: Gynecology;  Laterality: N/A;  . LAPAROSCOPIC BILATERAL SALPINGO OOPHERECTOMY N/A 05/08/2013   Procedure: LAPAROSCOPIC BILATERAL SALPINGO OOPHORECTOMY;  Surgeon: Olga Millers, MD;  Location: [facility];  Service:  Gynecology;  Laterality: N/A;  . LEFT HEART CATH AND CORONARY ANGIOGRAPHY N/A 07/19/2017   Procedure: LEFT HEART CATH AND CORONARY ANGIOGRAPHY;  Surgeon: Martinique, Peter M, MD;  Location: [facility];  Service: Cardiovascular;  Laterality: N/A;  . UPPER GASTROINTESTINAL ENDOSCOPY       OB History   None      Home Medications    Prior to Admission medications   Medication Sig Start Date End Date Taking? Authorizing Provider  acetaminophen (TYLENOL) 325 MG tablet Take 650 mg by mouth every 6 (six) hours as needed for headache.   Yes [provider]  aspirin EC 81 MG tablet Take 81 mg by mouth daily.   Yes [provider]  calcium carbonate (OSCAL) 1500 (600 Ca) MG TABS tablet Take 600 mg of elemental calcium by mouth 2 (two) times daily with a meal.   Yes [provider]  cetirizine (ZYRTEC) 10 MG tablet Take 1 tablet (10 mg total) by mouth daily. 04/29/17  Yes Hoyt Koch, MD  [facility] Liver Oil CAPS Take 1 capsule by mouth daily.   Yes [provider]  esomeprazole (NEXIUM) 40 MG capsule TAKE 1 CAPSULE AT 12:00 NOON EACH [place]. 08/08/17  Yes Hoyt Koch, MD  fluticasone [facility]) 50 MCG/ACT nasal spray Place 1 spray into both nostrils daily.   Yes [provider]  gabapentin (NEURONTIN) 300 MG capsule TAKE (1) CAPSULE THREE TIMES DAILY. Patient taking differently: TAKE (1) CAPSULE BY MOUTH THREE TIMES DAILY AS NEEDED FOR PAIN 03/01/17  Yes Hoyt Koch, MD  Multiple Vitamin (MULTIVITAMIN WITH MINERALS) TABS tablet Take 1 tablet by mouth daily.   Yes [provider]  nitroGLYCERIN (NITROSTAT) 0.4 MG SL tablet Place 1 tablet (0.4 mg total) under the tongue every 5 (five) minutes as needed for chest pain. 04/29/17  Yes Hoyt Koch, MD  ondansetron (ZOFRAN) 4 MG tablet Take 1 tablet (4 mg total) by mouth every 8 (eight) hours as needed for nausea or vomiting. 04/29/17  Yes Hoyt Koch, MD  pravastatin  (PRAVACHOL) 40 MG tablet Take 1 tablet (40 mg total) by mouth at bedtime. 08/12/17  Yes Hoyt Koch, MD  amLODipine (NORVASC) 5 MG tablet TAKE 1 TABLET BY MOUTH DAILY. 08/17/17   Hoyt Koch, MD  hydrALAZINE (APRESOLINE) 25 MG tablet TAKE  (1)  TABLET  EVERY EIGHT HOURS. 08/17/17   Hoyt Koch, MD  losartan-hydrochlorothiazide (HYZAAR) 100-12.5 MG tablet Take 1 tablet by mouth daily. 08/30/16   Fay Records, MD  MAGNESIUM PO Take 1 tablet by mouth daily.    [provider]  Menthol-Methyl Salicylate (MUSCLE RUB) 10-15 % CREA Apply 1 application topically daily as needed for muscle pain.    [provider]  metoprolol tartrate (LOPRESSOR) 25 MG tablet TAKE 1 TABLET BY MOUTH TWICE DAILY. 08/17/17   Hoyt Koch, MD  Tetrahydrozoline HCl (VISINE OP) Place 1 drop into  both eyes daily as needed (dry eyes).    [provider]    Family History Family History  Problem Relation Age of Onset  . Hypertension Mother   . Heart murmur Mother   . Colon polyps Mother   . Colon cancer Maternal Aunt   . Heart disease Son   . Heart disease Maternal Grandmother   . Diabetes Maternal Aunt   . Esophageal cancer Neg Hx   . Rectal cancer Neg Hx   . Stomach cancer Neg Hx     Social History Social History   Tobacco Use  . Smoking status: Former Smoker    Packs/Sisk: 1.00    Years: 3.00    Pack years: 3.00    Types: Cigarettes    Last attempt to quit: 04/17/2017    Years since quitting: 0.3  . Smokeless tobacco: Never Used  . Tobacco comment: Divorced, lives with female domestic partner  Substance Use Topics  . Alcohol use: Yes    Comment: 07/20/2017 "might drink once or twice/year"  . Drug use: No     Allergies   Clonidine hydrochloride; Codeine; Ibuprofen; and Lovastatin   Review of Systems Review of Systems  Eyes: Positive for visual disturbance.  Respiratory: Negative for shortness of breath.   Cardiovascular: Negative for chest  pain.  Neurological: Positive for weakness. Negative for numbness and headaches.  All other systems reviewed and are negative.    Physical Exam Updated Vital Signs BP (!) 141/92 (BP Location: Left Arm)   Pulse 78   Temp 98.1 F (36.7 C) (Oral)   Resp 16   LMP 06/07/2012   SpO2 96%   Physical Exam  Constitutional: She is oriented to person, place, and time. She appears well-developed and well-nourished. No distress.  HENT:  Head: Normocephalic and atraumatic.  Cardiovascular: Normal rate, regular rhythm and normal heart sounds.  No murmur heard. Pulmonary/Chest: Effort normal and breath sounds normal. No respiratory distress. She has no wheezes. She has no rales.  Abdominal: Soft. She exhibits no distension. There is no tenderness.  Musculoskeletal: She exhibits no edema.  Neurological: She is alert and oriented to person, place, and time.  Speech clear and goal oriented. CN 2-12 grossly intact. Normal finger-to-nose and rapid alternating movements. No drift. Strength and sensation intact. Steady gait.  Skin: Skin is warm and dry.  Nursing note and vitals reviewed.    ED Treatments / Results  Labs (all labs ordered are listed, but only abnormal results are displayed) Labs Reviewed  BASIC METABOLIC PANEL - Abnormal; Notable for the following components:      Result Value   Potassium 3.2 (*)    CO2 21 (*)    Glucose, Bld 125 (*)    Creatinine, Ser 1.37 (*)    GFR calc non Af Amer 43 (*)    GFR calc Af Amer 49 (*)    All other components within normal limits  CBG MONITORING, ED - Abnormal; Notable for the following components:   Glucose-Capillary 125 (*)    All other components within normal limits  CBC  D-DIMER, QUANTITATIVE (NOT AT [facility])  I-STAT BETA HCG BLOOD, ED ([facility], [facility], [facility] ONLY)  I-STAT TROPONIN, ED  I-STAT TROPONIN, ED    EKG EKG Interpretation  Date/Time:  Sunday Aug 21 2017 08:45:37 EDT Ventricular Rate:  89 PR Interval:    QRS Duration: 101 QT  Interval:  386 QTC Calculation: 470 R Axis:   -86 Text Interpretation:  Sinus rhythm Left anterior fascicular  block Low voltage, precordial leads Borderline T wave abnormalities Baseline wander in lead(s) V5 V6 No significant change since last tracing Confirmed by Wandra Arthurs [phone]) on 08/21/2017 9:09:01 AM   Radiology No results found.  Procedures Procedures (including critical care time)  Medications Ordered in ED Medications  potassium chloride SA (K-DUR,KLOR-CON) CR tablet 40 mEq (40 mEq Oral Given 08/21/17 1123)  amLODipine (NORVASC) tablet 5 mg (5 mg Oral Given 08/21/17 1123)  hydrALAZINE (APRESOLINE) tablet 25 mg (25 mg Oral Given 08/21/17 1123)  metoprolol tartrate (LOPRESSOR) tablet 25 mg (25 mg Oral Given 08/21/17 1123)     Initial Impression / Assessment and Plan / ED Course  I have reviewed the triage vital signs and the nursing notes.  Pertinent labs & imaging results that were available during my care of the patient were reviewed by me and considered in my medical decision making (see chart for details).    Rayvn Rickerson Treu is a 57 y.o. female who presents to ED for near syncopal episode at the end of her exercise today at the gym.  Normal cardiopulmonary exam.  No focal neuro deficits.  Feels improved upon ED arrival.  Chart reviewed. Patient with recent admission on 4/22 with positive troponin/NSTEMI.  She did undergo cardiac catheterization with normal coronary anatomy. EKG today reassuring. Troponin negative x 2. Remainder of labs reassuring as well. K+ of 3.2 noted - replenished in ED. Patient ambulated without any difficulty. On re-evaluation, she feels back to baseline with no complaints. Evaluation does not show pathology that would require ongoing emergent intervention or inpatient treatment. Will have her follow up with cardiology. Reasons to return to ER discussed and all questions answered.  Patient discussed with Dr. Darl Householder who agrees with treatment plan.   Final  Clinical Impressions(s) / ED Diagnoses   Final diagnoses:  Near syncope    ED Discharge Orders    None       Everardo Voris, Ozella Almond, PA-C 08/21/17 1342    Drenda Freeze, MD 08/21/17 [phone]

## 2017-08-25 ENCOUNTER — Ambulatory Visit (INDEPENDENT_AMBULATORY_CARE_PROVIDER_SITE_OTHER): Payer: Medicare Other | Admitting: Internal Medicine

## 2017-08-25 ENCOUNTER — Encounter: Payer: Self-pay | Admitting: Internal Medicine

## 2017-08-25 VITALS — BP 118/90 | HR 76 | Temp 97.9°F | Ht 64.0 in | Wt 169.0 lb

## 2017-08-25 DIAGNOSIS — R0602 Shortness of breath: Secondary | ICD-10-CM | POA: Insufficient documentation

## 2017-08-25 DIAGNOSIS — R079 Chest pain, unspecified: Secondary | ICD-10-CM | POA: Diagnosis not present

## 2017-08-25 DIAGNOSIS — I214 Non-ST elevation (NSTEMI) myocardial infarction: Secondary | ICD-10-CM | POA: Diagnosis not present

## 2017-08-25 DIAGNOSIS — I10 Essential (primary) hypertension: Secondary | ICD-10-CM | POA: Diagnosis not present

## 2017-08-25 LAB — POCT EXHALED NITRIC OXIDE: FeNO level (ppb): 10

## 2017-08-25 NOTE — Assessment & Plan Note (Signed)
Still having some pressure but recent EKG and labs at the ER were negative and recent cath without obstructive findings. BP is more controlled today.

## 2017-08-25 NOTE — Assessment & Plan Note (Signed)
Suspect deconditioning. Checked FENO in office which was 10 to rule out lung inflammation as cause. Home health ordered so she feels safe to exercise as she is not able to leave home since the syncope. Recent NSTEMI.

## 2017-08-25 NOTE — Assessment & Plan Note (Signed)
Suspect that this has caused deconditioning and possible some adjustment disorder. Ordered home health and if no improvement may need antidepressant.

## 2017-08-25 NOTE — Progress Notes (Signed)
   Subjective:    Patient ID: Hayley Miller, female    DOB: 10-19-1960, 57 y.o.   MRN: [id number]  HPI The patient is a 57 YO female coming in for SOB and other concerns. She is having a lot of SOB since NSTEMI back about 1 month ago. She tried to go to the gym and was working out for about 20 minutes and was pre-syncopal and then passed out in the car on the way to the ER (she was not driving). She was not found to have NSTEMI again in the ER. Some mild hypokalemia but otherwise normal. She denies chest pain but some pressure at times. She is not doing much at home due to SOB and fatigue. She is wanting handicapped sticker since she has no energy currently. Denies current smoking.   Review of Systems  Constitutional: Positive for activity change, appetite change and fatigue. Negative for unexpected weight change.  HENT: Negative.   Eyes: Negative.   Respiratory: Positive for shortness of breath. Negative for cough and chest tightness.   Cardiovascular: Negative for chest pain, palpitations and leg swelling.       Chest pressure  Gastrointestinal: Negative for abdominal distention, abdominal pain, constipation, diarrhea, nausea and vomiting.  Musculoskeletal: Negative.   Skin: Negative.   Neurological: Negative.   Psychiatric/Behavioral: Negative.       Objective:   Physical Exam  Constitutional: She is oriented to person, place, and time. She appears well-developed and well-nourished.  HENT:  Head: Normocephalic and atraumatic.  Eyes: EOM are normal.  Neck: Normal range of motion.  Cardiovascular: Normal rate and regular rhythm.  Pulmonary/Chest: Effort normal and breath sounds normal. No respiratory distress. She has no wheezes. She has no rales.  Abdominal: Soft. Bowel sounds are normal. She exhibits no distension. There is no tenderness. There is no rebound.  Musculoskeletal: She exhibits no edema.  Neurological: She is alert and oriented to person, place, and time. Coordination  normal.  Skin: Skin is warm and dry.  Psychiatric: She has a normal mood and affect.   Vitals:   08/25/17 1530  BP: 118/90  Pulse: 76  Temp: 97.9 F (36.6 C)  TempSrc: Oral  SpO2: 99%  Weight: 169 lb (76.7 kg)  Height: 5\' 4"  (1.626 m)   FENO: 10    Assessment & Plan:

## 2017-08-25 NOTE — Patient Instructions (Signed)
We will get some therapy out to the house to see if they can work with you to build back stamina.   The lungs are normal which is good.   We have given you the handicapped sticker to take to the [facility].

## 2017-08-25 NOTE — Assessment & Plan Note (Signed)
BP is more controlled with less stress recently. Continue her amlodipine, hydralazine, losartan/hctz, metoprolol.

## 2017-08-31 ENCOUNTER — Telehealth: Payer: Self-pay | Admitting: Internal Medicine

## 2017-08-31 NOTE — Telephone Encounter (Signed)
Copied from [place] [id number]. Topic: Quick Communication - See Telephone Encounter >> Aug 31, 2017  2:50 PM Cleaster Corin, NT wrote: CRM for notification. See Telephone encounter for: 08/31/17.  Beverlee Nims calling from [facility] to let Dr. Sharlet Salina know that pt. Hasn't answered the phone in order for PT to evaluate pt. Checked to make sure she had the same numbers for pt. Which she did pt. Isn't answering. Beverlee Nims can be reached at [phone]

## 2017-08-31 NOTE — Telephone Encounter (Signed)
MD is out of the office this week will forward to her desktop for FYI.Marland KitchenJohny Chess

## 2017-09-22 ENCOUNTER — Other Ambulatory Visit: Payer: Self-pay

## 2017-09-22 ENCOUNTER — Ambulatory Visit: Payer: Self-pay | Admitting: Internal Medicine

## 2017-09-22 ENCOUNTER — Emergency Department (HOSPITAL_COMMUNITY): Payer: Medicare Other

## 2017-09-22 ENCOUNTER — Emergency Department (HOSPITAL_COMMUNITY)
Admission: EM | Admit: 2017-09-22 | Discharge: 2017-09-22 | Disposition: A | Discharge status: Home / Self Care | Payer: Medicare Other | Attending: Emergency Medicine | Admitting: Emergency Medicine

## 2017-09-22 ENCOUNTER — Encounter (HOSPITAL_COMMUNITY): Payer: Self-pay | Admitting: Emergency Medicine

## 2017-09-22 DIAGNOSIS — I959 Hypotension, unspecified: Secondary | ICD-10-CM | POA: Diagnosis not present

## 2017-09-22 DIAGNOSIS — M50322 Other cervical disc degeneration at C5-C6 level: Secondary | ICD-10-CM | POA: Diagnosis not present

## 2017-09-22 DIAGNOSIS — R0789 Other chest pain: Secondary | ICD-10-CM | POA: Insufficient documentation

## 2017-09-22 DIAGNOSIS — R202 Paresthesia of skin: Secondary | ICD-10-CM | POA: Diagnosis not present

## 2017-09-22 DIAGNOSIS — R200 Anesthesia of skin: Secondary | ICD-10-CM | POA: Diagnosis not present

## 2017-09-22 LAB — I-STAT TROPONIN, ED
TROPONIN I, POC: 0.01 ng/mL (ref 0.00–0.08)
Troponin i, poc: 0.02 ng/mL (ref 0.00–0.08)

## 2017-09-22 LAB — BASIC METABOLIC PANEL
Anion gap: 9 (ref 5–15)
BUN: 19 mg/dL (ref 6–20)
CO2: 28 mmol/L (ref 22–32)
Calcium: 9.9 mg/dL (ref 8.9–10.3)
Chloride: 105 mmol/L (ref 98–111)
Creatinine, Ser: 1.28 mg/dL — ABNORMAL HIGH (ref 0.44–1.00)
GFR calc Af Amer: 54 mL/min — ABNORMAL LOW (ref >60)
GFR calc non Af Amer: 46 mL/min — ABNORMAL LOW (ref >60)
Glucose, Bld: 84 mg/dL (ref 70–99)
Potassium: 3.5 mmol/L (ref 3.5–5.1)
Sodium: 142 mmol/L (ref 135–145)

## 2017-09-22 LAB — CBC
HCT: 43.5 % (ref 36.0–46.0)
Hemoglobin: 14.2 g/dL (ref 12.0–15.0)
MCH: 31 pg (ref 26.0–34.0)
MCHC: 32.6 g/dL (ref 30.0–36.0)
MCV: 95 fL (ref 78.0–100.0)
Platelets: 424 10*3/uL — ABNORMAL HIGH (ref 150–400)
RBC: 4.58 MIL/uL (ref 3.87–5.11)
RDW: 13.6 % (ref 11.5–15.5)
WBC: 8.4 10*3/uL (ref 4.0–10.5)

## 2017-09-22 MED ORDER — PREDNISONE 10 MG (21) PO TBPK: ORAL_TABLET | Freq: Every day | ORAL | 0 refills | Status: DC

## 2017-09-22 NOTE — Telephone Encounter (Signed)
FYI

## 2017-09-22 NOTE — ED Notes (Signed)
Patient transported to X-ray 

## 2017-09-22 NOTE — Telephone Encounter (Signed)
Pt called c/o 4 [place] h/o tingling from left shoulder to her hand and unsteadiness on her feet. Pt stated "I've never felt like this before." Per pt no arm drift, speech does not sound slurred, and asked pt to look at a mirror and she stated her smile is equal. Denies any weakness. Pt did state she is unsteady on her feet. Referred pt to the ED ASAP. Unsure if pt will go. Called PCP office and spoke to [place]. Dorma Russell on triage and per Dr Nathanial Millman nurse agreed with disposition.  Spoke with pt and again asked pt to go to the hospital. Pt states that she will call 911 after she picks up her grandchild. Informed pt again that she needs to be evaluated at the ED ASAP. Care advice given. Will route note back to the PCP office. Reason for Disposition . [1] Tingling (e.g., pins and needles) of the face, arm / hand, or leg / foot on one side of the body AND [2] present now  Answer Assessment - Initial Assessment Questions 1. SYMPTOM: "What is the main symptom you are concerned about?" (e.g., weakness, numbness)     Tingling from shoulder to hand  2. ONSET: "When did this start?" (minutes, hours, days; while sleeping)     4 days 3. LAST NORMAL: "When was the last time you were normal (no symptoms)?"     5 days ago 4. PATTERN "Does this come and go, or has it been constant since it started?"  "Is it present now?"     Comes and goes- yes  5. CARDIAC SYMPTOMS: "Have you had any of the following symptoms: chest pain, difficulty breathing, palpitations?"     no 6. NEUROLOGIC SYMPTOMS: "Have you had any of the following symptoms: headache, dizziness, vision loss, double vision, changes in speech, unsteady on your feet?"     Unsteady on feet 7. OTHER SYMPTOMS: "Do you have any other symptoms?"     no 8. PREGNANCY: "Is there any chance you are pregnant?" "When was your last menstrual period?"     n/a  Protocols used: NEUROLOGIC DEFICIT-A-AH

## 2017-09-22 NOTE — ED Triage Notes (Signed)
Pt adds that she had light heart attack last month and was in hospital. Then had syncopal episode in car and a stroke last month.

## 2017-09-22 NOTE — ED Triage Notes (Signed)
Per [facility] pt comes from home for left arm tingling x 4 days after picking her mother up off the ground. Has hx of shoulder injury before but is her dominate arm.

## 2017-09-22 NOTE — Discharge Instructions (Signed)
Continue taking your home medications as prescribed including Nexium.  Avoid spicy foods, fried foods, alcohol, or other foods that will upset your stomach.  Start taking prednisone as prescribed.  Do not take ibuprofen, Aleve, Advil, or Motrin while taking this medication.  You may take Tylenol every 6 hours as needed for pain in addition to the prednisone.  Follow-up with your primary care physician or a spine surgeon/neurosurgeon for reevaluation of your symptoms.  Return to the emergency department immediately for any concerning signs or symptoms develop such as fevers, worsening chest pain, shortness of breath, weakness, passing out, slurred speech, or facial droop.

## 2017-09-22 NOTE — ED Provider Notes (Signed)
[facility] DEPT Provider Note   CSN: [id number] Arrival date & time: 09/22/17  1449     History   Chief Complaint Chief Complaint  Patient presents with  . Tingling    HPI Hayley Miller is a 57 y.o. female with history of CAD, fibromyalgia, HLD, HTN, MI presents for evaluation of acute onset, intermittent left arm tingling for 4 days.  She states that several times a [employer] she will experience a numbness and tingling sensation to her left arm; sometimes in her hand, sometimes in her forearm, sometimes in her upper arm.  Symptoms do not seem to follow a particular distribution and are not elicited with any specific movements.  We will last for several minutes before resolving.  She notes that she has had injury to the left shoulder in the past but this feels different.  She thinks her symptoms may have started after helping her mom up off the ground but she is unsure.  Denies any significant neck pain or headaches.  Also notes intermittent substernal burning chest pain which typically occurs after meals.  She thinks this is related to reflux but is unsure.  Has not tried anything for her symptoms.  States it typically comes on and lasts about 10 minutes before resolving on its own.  Does not radiate.  It is not exertional or pleuritic.  Denies any associated shortness of breath, nausea, vomiting, diaphoresis, lightheadedness, or syncope.  She states that she had a "light heart attack and a stroke last month ".  Chart review does show that she was admitted for an STEMI in May but no documentation of a confirmed stroke.  She quit smoking in January.  Patient denies chest pain or numbness/tingling at this time.  The history is provided by the patient.    Past Medical History:  Diagnosis Date  . Anemia   . Anxiety   . CAD (coronary artery disease)    a. s/p AL MI in 2006 tx with PTCA to LAD;  b. LHC (10/2009): lum irregs in LAD, o/w no CAD, EF 55-60%;  c. Myoview  (07/2011):  no ischemia, EF 63%;  d. Echo (11/2011):  mild LVH, EF 60-65%, Gr 1 DD;  e.  Lexiscan Myoview (04/2013):  Inferior bowel atten artifact with inferoapical defect; no ischemia; EF 58% (low risk)  . Cameron lesion, acute   . Chronic lower back pain   . Chronic pain syndrome   . Daily headache   . Depression   . Erosive esophagitis   . Fibromyalgia   . GERD (gastroesophageal reflux disease)   . Hemorrhage    upper gastrointestional. GI bleed 10/09 due to NSAID  . Hiatal hernia   . HLD (hyperlipidemia)   . HTN (hypertension)   . Myocardial infarction ([place]) 2006 X 2  . Rhinitis   . Seasonal allergies     Patient Active Problem List   Diagnosis Date Noted  . SOB (shortness of breath) 08/25/2017  . Chest pain 07/19/2017  . Hypertensive urgency 07/19/2017  . Bleeding nose 04/29/2017  . Other forms of angina pectoris ([place]) 06/02/2016  . Syncope 03/11/2014  . Tinnitus 03/11/2014  . Fibromyalgia 04/18/2009  . NSTEMI (non-ST elevated myocardial infarction) ([place]) 04/15/2009  . LOW BACK PAIN 01/12/2008  . Depression 07/27/2007  . Hyperlipidemia 06/27/2007  . Severe uncontrolled hypertension 06/27/2007  . CAD (coronary artery disease) 06/27/2007  . GERD 06/27/2007    Past Surgical History:  Procedure Laterality Date  . BLADDER SUSPENSION  N/A 05/08/2013   Procedure: TRANSVAGINAL TAPE (TVT) PROCEDURE;  Surgeon: Olga Millers, MD;  Location: [facility];  Service: Gynecology;  Laterality: N/A;  . BREAST CYST EXCISION Left 2002  . CARDIAC CATHETERIZATION  07/19/2017  . CORONARY ANGIOPLASTY  2006  . CYSTOSCOPY N/A 05/08/2013   Procedure: CYSTOSCOPY;  Surgeon: Olga Millers, MD;  Location: [facility];  Service: Gynecology;  Laterality: N/A;  . LAPAROSCOPIC ASSISTED VAGINAL HYSTERECTOMY N/A 05/08/2013   Procedure: LAPAROSCOPIC ASSISTED VAGINAL HYSTERECTOMY;  Surgeon: Olga Millers, MD;  Location: [facility];  Service: Gynecology;  Laterality: N/A;  . LAPAROSCOPIC BILATERAL SALPINGO  OOPHERECTOMY N/A 05/08/2013   Procedure: LAPAROSCOPIC BILATERAL SALPINGO OOPHORECTOMY;  Surgeon: Olga Millers, MD;  Location: [facility];  Service: Gynecology;  Laterality: N/A;  . LEFT HEART CATH AND CORONARY ANGIOGRAPHY N/A 07/19/2017   Procedure: LEFT HEART CATH AND CORONARY ANGIOGRAPHY;  Surgeon: Martinique, Peter M, MD;  Location: [facility];  Service: Cardiovascular;  Laterality: N/A;  . UPPER GASTROINTESTINAL ENDOSCOPY       OB History   None      Home Medications    Prior to Admission medications   Medication Sig Start Date End Date Taking? Authorizing Provider  acetaminophen (TYLENOL) 325 MG tablet Take 650 mg by mouth every 6 (six) hours as needed for headache.   Yes [provider]  amLODipine (NORVASC) 5 MG tablet TAKE 1 TABLET BY MOUTH DAILY. 08/17/17  Yes Hoyt Koch, MD  aspirin EC 81 MG tablet Take 81 mg by mouth daily.   Yes [provider]  calcium carbonate (OSCAL) 1500 (600 Ca) MG TABS tablet Take 600 mg of elemental calcium by mouth daily with breakfast.    Yes [provider]  cetirizine (ZYRTEC) 10 MG tablet Take 1 tablet (10 mg total) by mouth daily. Patient taking differently: Take 10 mg by mouth daily as needed for allergies.  04/29/17  Yes Hoyt Koch, MD  [facility] Liver Oil CAPS Take 1 capsule by mouth daily.   Yes [provider]  esomeprazole (NEXIUM) 40 MG capsule TAKE 1 CAPSULE AT 12:00 NOON EACH [place]. 08/08/17  Yes Hoyt Koch, MD  fluticasone [facility]) 50 MCG/ACT nasal spray Place 1 spray into both nostrils daily as needed for allergies.    Yes [provider]  gabapentin (NEURONTIN) 300 MG capsule TAKE (1) CAPSULE THREE TIMES DAILY. Patient taking differently: TAKE (1) CAPSULE BY MOUTH THREE TIMES DAILY FOR PAIN 03/01/17  Yes Hoyt Koch, MD  hydrALAZINE (APRESOLINE) 25 MG tablet TAKE  (1)  TABLET  EVERY EIGHT HOURS. 08/17/17  Yes Hoyt Koch, MD    losartan-hydrochlorothiazide (HYZAAR) 100-12.5 MG tablet Take 1 tablet by mouth daily. 08/30/16  Yes Fay Records, MD  MAGNESIUM PO Take 1 tablet by mouth daily.   Yes [provider]  Menthol-Methyl Salicylate (MUSCLE RUB) 10-15 % CREA Apply 1 application topically daily as needed for muscle pain.   Yes [provider]  metoprolol tartrate (LOPRESSOR) 25 MG tablet TAKE 1 TABLET BY MOUTH TWICE DAILY. 08/17/17  Yes Hoyt Koch, MD  Multiple Vitamin (MULTIVITAMIN WITH MINERALS) TABS tablet Take 1 tablet by mouth daily.   Yes [provider]  nitroGLYCERIN (NITROSTAT) 0.4 MG SL tablet Place 1 tablet (0.4 mg total) under the tongue every 5 (five) minutes as needed for chest pain. 04/29/17  Yes Hoyt Koch, MD  pravastatin (PRAVACHOL) 40 MG tablet Take 1 tablet (40 mg total) by mouth  at bedtime. 08/12/17  Yes Hoyt Koch, MD  Tetrahydrozoline HCl (VISINE OP) Place 1 drop into both eyes daily as needed (dry eyes).   Yes [provider]  ondansetron (ZOFRAN) 4 MG tablet Take 1 tablet (4 mg total) by mouth every 8 (eight) hours as needed for nausea or vomiting. 04/29/17   Hoyt Koch, MD  predniSONE (STERAPRED UNI-PAK 21 TAB) 10 MG (21) TBPK tablet Take by mouth daily. Take 6 tabs by mouth daily  for 2 days, then 5 tabs for 2 days, then 4 tabs for 2 days, then 3 tabs for 2 days, 2 tabs for 2 days, then 1 tab by mouth daily for 2 days 09/22/17   Renita Papa, PA-C    Family History Family History  Problem Relation Age of Onset  . Hypertension Mother   . Heart murmur Mother   . Colon polyps Mother   . Colon cancer Maternal Aunt   . Heart disease Son   . Heart disease Maternal Grandmother   . Diabetes Maternal Aunt   . Esophageal cancer Neg Hx   . Rectal cancer Neg Hx   . Stomach cancer Neg Hx     Social History Social History   Tobacco Use  . Smoking status: Former Smoker    Packs/[place]: 1.00    Years: 3.00    Pack years:  3.00    Types: Cigarettes    Last attempt to quit: 04/17/2017    Years since quitting: 0.4  . Smokeless tobacco: Never Used  . Tobacco comment: Divorced, lives with female domestic partner  Substance Use Topics  . Alcohol use: Yes    Comment: 07/20/2017 "might drink once or twice/year"  . Drug use: No     Allergies   Clonidine hydrochloride; Codeine; Ibuprofen; and Lovastatin   Review of Systems Review of Systems  Constitutional: Negative for chills and fever.  Respiratory: Negative for shortness of breath.   Cardiovascular: Positive for chest pain.  Gastrointestinal: Negative for abdominal pain, nausea and vomiting.  Neurological: Positive for numbness. Negative for syncope, weakness and light-headedness.  All other systems reviewed and are negative.    Physical Exam Updated Vital Signs BP (!) 148/89   Pulse 72   Temp 98.1 F (36.7 C) (Oral)   Resp 16   Ht 5\' 4"  (1.626 m)   Wt 76.7 kg (169 lb)   LMP 06/07/2012   SpO2 98%   BMI 29.01 kg/m   Physical Exam  Constitutional: She is oriented to person, place, and time. She appears well-developed and well-nourished. No distress.  HENT:  Head: Normocephalic and atraumatic.  Eyes: Conjunctivae are normal. Right eye exhibits no discharge. Left eye exhibits no discharge.  Neck: Normal range of motion. Neck supple. No JVD present. No tracheal deviation present.  Cardiovascular: Normal rate, regular rhythm, normal heart sounds and intact distal pulses.  2+ radial and DP/PT pulses bilaterally, Homans sign absent bilaterally, no lower extremity edema, no palpable cords, compartments are soft   Pulmonary/Chest: Effort normal and breath sounds normal. No stridor. No respiratory distress. She has no wheezes. She has no rales. She exhibits no tenderness.  Abdominal: Soft. Bowel sounds are normal. She exhibits no distension. There is no tenderness. There is no guarding.  Musculoskeletal: Normal range of motion. She exhibits tenderness.  She exhibits no edema.  No midline spine TTP, mild left paracervical muscle tenderness and spasm overlying the trapezius, no deformity, crepitus, or step-off noted.  Pain elicited with lateral rotation of  the neck to the left.  5/5 strength of BUE and BLE major muscle groups.  Neurological: She is alert and oriented to person, place, and time. No cranial nerve deficit or sensory deficit. She exhibits normal muscle tone.  Mental Status:  Alert, thought content appropriate, able to give a coherent history. Speech fluent without evidence of aphasia. Able to follow 2 step commands without difficulty.  Cranial Nerves:  II:  Peripheral visual fields grossly normal, pupils equal, round, reactive to light III,IV, VI: ptosis not present, extra-ocular motions intact bilaterally  V,VII: smile symmetric, facial light touch sensation equal VIII: hearing grossly normal to voice  X: uvula elevates symmetrically  XI: bilateral shoulder shrug symmetric and strong XII: midline tongue extension without fassiculations Motor:  Normal tone. 5/5 strength of BUE and BLE major muscle groups including strong and equal grip strength and dorsiflexion/plantar flexion Sensory: light touch normal in all extremities. Cerebellar: normal finger-to-nose with bilateral upper extremities Gait: normal gait and balance. Able to walk on toes and heels with ease.  No pronator drift, no nystagmus.  Romberg sign absent.   Skin: Skin is warm and dry. No erythema.  Psychiatric: She has a normal mood and affect. Her behavior is normal.  Nursing note and vitals reviewed.    ED Treatments / Results  Labs (all labs ordered are listed, but only abnormal results are displayed) Labs Reviewed  BASIC METABOLIC PANEL - Abnormal; Notable for the following components:      Result Value   Creatinine, Ser 1.28 (*)    GFR calc non Af Amer 46 (*)    GFR calc Af Amer 54 (*)    All other components within normal limits  CBC - Abnormal;  Notable for the following components:   Platelets 424 (*)    All other components within normal limits  I-STAT TROPONIN, ED  I-STAT TROPONIN, ED    EKG EKG Interpretation  Date/Time:  Thursday September 22 2017 16:52:05 EDT Ventricular Rate:  72 PR Interval:    QRS Duration: 104 QT Interval:  462 QTC Calculation: 506 R Axis:   -82 Text Interpretation:  Sinus rhythm Borderline prolonged PR interval Left anterior fascicular block Borderline prolonged QT interval Confirmed by Milton Ferguson [phone]) on 09/22/2017 5:13:26 PM Also confirmed by Milton Ferguson [phone])  on 09/22/2017 6:01:41 PM   Radiology Dg Chest 2 View  Result Date: 09/22/2017 CLINICAL DATA:  Pt states tingling in left arm for 4 days. No known injury. Hx of hypertension Ex smoker EXAM: CHEST - 2 VIEW COMPARISON:  07/18/2017 FINDINGS: Heart size is normal. Aorta is tortuous. There are no focal consolidations or pleural effusions and no pulmonary edema. IMPRESSION: No active cardiopulmonary disease. Electronically Signed   By: Nolon Nations M.D.   On: 09/22/2017 17:07   Dg Cervical Spine Complete  Result Date: 09/22/2017 CLINICAL DATA:  Left arm tingling for the past 4 days. EXAM: CERVICAL SPINE - COMPLETE 4+ VIEW COMPARISON:  None. FINDINGS: The lateral view is diagnostic to the C7 level. There is no acute fracture or subluxation. Vertebral body heights are preserved. Alignment is normal. Mild disc height loss at C3-C4 and C5-C6. Neural foramina are widely patent. Scattered mild facet arthropathy.Normal prevertebral soft tissues. IMPRESSION: 1. Mild degenerative disc disease at C3-C4 and C5-C6. Electronically Signed   By: Titus Dubin M.D.   On: 09/22/2017 17:06   Dg Shoulder Left  Result Date: 09/22/2017 CLINICAL DATA:  57 year old female with left arm tingling EXAM: LEFT SHOULDER - 2+ VIEW  COMPARISON:  None. FINDINGS: There is no evidence of fracture or dislocation. There is no evidence of arthropathy or other focal bone  abnormality. Soft tissues are unremarkable. IMPRESSION: Negative. Electronically Signed   By: Jacqulynn Cadet M.D.   On: 09/22/2017 17:04    Procedures Procedures (including critical care time)  Medications Ordered in ED Medications - No data to display   Initial Impression / Assessment and Plan / ED Course  I have reviewed the triage vital signs and the nursing notes.  Pertinent labs & imaging results that were available during my care of the patient were reviewed by me and considered in my medical decision making (see chart for details).    Patient presents with complaint of intermittent tingling sensation of the left upper extremity as well as intermittent midline burning chest pain for 4 days.  She is afebrile, vital signs are stable.  She is nontoxic in appearance.  Chest pain is not reproducible on palpation but does not sound cardiac in etiology: It is not pleuritic or exertional.  EKG shows no acute changes from last tracing, serial troponins are negative.  Low suspicion of ACS or MI.  No shortness of breath to suggest PE, pneumonia, or pleural effusion.  Chest x-ray shows no acute cardiopulmonary abnormalities.  Symptoms appear suggestive of GERD and worsen after meals.  She does tell me she has been eating fried chicken over the past several days.  Recommend continuing with her Nexium and avoiding foods that will upset her stomach  She is neurovascularly intact, no focal neurologic deficits on examination.  She is ambulatory without difficulty.  I doubt acute intracranial abnormality such as CVA.  Symptoms appear suggestive of possible cervical radiculopathy and radiographs show degenerative disc disease at C3/C4 and C5/C6.  Patient is resting comfortably no apparent distress.  She may benefit from a steroid taper.  Recommend follow-up with PCP or neurosurgery/spine surgery for reevaluation.  Discussed strict ED return precautions. Pt verbalized understanding of and agreement with plan  and is safe for discharge home at this time.  Patient was seen and evaluated by Dr. Roderic Palau who agrees with assessment and plan at this time  Final Clinical Impressions(s) / ED Diagnoses   Final diagnoses:  Numbness and tingling in left arm  Atypical chest pain    ED Discharge Orders        Ordered    predniSONE (STERAPRED UNI-PAK 21 TAB) 10 MG (21) TBPK tablet  Daily     09/22/17 1926       Debroah Baller 09/22/17 1930    Milton Ferguson, MD 09/23/17 1639

## 2017-10-19 ENCOUNTER — Other Ambulatory Visit: Payer: Self-pay | Admitting: Internal Medicine

## 2017-10-25 ENCOUNTER — Ambulatory Visit: Payer: Medicare Other | Admitting: Internal Medicine

## 2017-10-27 ENCOUNTER — Other Ambulatory Visit: Payer: Self-pay | Admitting: Internal Medicine

## 2017-11-02 ENCOUNTER — Other Ambulatory Visit: Payer: Self-pay | Admitting: Internal Medicine

## 2017-11-29 ENCOUNTER — Other Ambulatory Visit: Payer: Self-pay | Admitting: Internal Medicine

## 2017-12-01 ENCOUNTER — Encounter: Payer: Self-pay | Admitting: Internal Medicine

## 2017-12-01 ENCOUNTER — Other Ambulatory Visit (INDEPENDENT_AMBULATORY_CARE_PROVIDER_SITE_OTHER): Payer: Medicare Other

## 2017-12-01 ENCOUNTER — Other Ambulatory Visit: Payer: Self-pay | Admitting: Internal Medicine

## 2017-12-01 ENCOUNTER — Ambulatory Visit (INDEPENDENT_AMBULATORY_CARE_PROVIDER_SITE_OTHER): Payer: Medicare Other | Admitting: Internal Medicine

## 2017-12-01 VITALS — BP 118/86 | HR 85 | Temp 97.9°F | Ht 64.0 in | Wt 180.0 lb

## 2017-12-01 DIAGNOSIS — I10 Essential (primary) hypertension: Secondary | ICD-10-CM

## 2017-12-01 DIAGNOSIS — L659 Nonscarring hair loss, unspecified: Secondary | ICD-10-CM

## 2017-12-01 DIAGNOSIS — R21 Rash and other nonspecific skin eruption: Secondary | ICD-10-CM

## 2017-12-01 LAB — COMPREHENSIVE METABOLIC PANEL
ALBUMIN: 4.1 g/dL (ref 3.5–5.2)
ALK PHOS: 66 U/L (ref 39–117)
ALT: 14 U/L (ref 0–35)
AST: 15 U/L (ref 0–37)
BILIRUBIN TOTAL: 0.3 mg/dL (ref 0.2–1.2)
BUN: 21 mg/dL (ref 6–23)
CALCIUM: 10 mg/dL (ref 8.4–10.5)
CO2: 27 meq/L (ref 19–32)
CREATININE: 1.13 mg/dL (ref 0.40–1.20)
Chloride: 105 mEq/L (ref 96–112)
GFR: 64.14 mL/min (ref >60.00)
Glucose, Bld: 90 mg/dL (ref 70–99)
Potassium: 3.7 mEq/L (ref 3.5–5.1)
Sodium: 139 mEq/L (ref 135–145)
TOTAL PROTEIN: 7.3 g/dL (ref 6.0–8.3)

## 2017-12-01 LAB — VITAMIN D 25 HYDROXY (VIT D DEFICIENCY, FRACTURES): VITD: 32.09 ng/mL (ref 30.00–100.00)

## 2017-12-01 LAB — TSH: TSH: 2.17 u[IU]/mL (ref 0.35–4.50)

## 2017-12-01 LAB — VITAMIN B12: Vitamin B-12: 952 pg/mL — ABNORMAL HIGH (ref 211–911)

## 2017-12-01 MED ORDER — METOPROLOL TARTRATE 25 MG PO TABS: 25.0000 mg | ORAL_TABLET | Freq: Two times a day (BID) | ORAL | 3 refills | Status: DC

## 2017-12-01 MED ORDER — TRIAMCINOLONE ACETONIDE 0.1 % EX CREA: 1.0000 "application " | TOPICAL_CREAM | Freq: Two times a day (BID) | CUTANEOUS | 0 refills | Status: DC

## 2017-12-01 NOTE — Progress Notes (Signed)
   Subjective:    Patient ID: Hayley Miller, female    DOB: 08/23/1960, 57 y.o.   MRN: [id number]  HPI The patient is a 57 YO female coming in for several concerns including blood pressure (was told she had to come in per pharmacy as she is almost out of metoprolol, taking metoprolol and losartan/hctz and amlodipine and hydralazine, denies chest pains or headaches today), and hair thinning (feels her hair is more fragile since this summer when her blood pressure medications were altered, denies change in diet, has not tried anything for it, denies a patch of loss, this is global thinning and fragility), as well as rash on her foot (started while she was at her son's house to shower, does itch, denies pain, denies worsening, started 1-2 weeks ago).   Review of Systems  Constitutional: Negative.   HENT: Negative.        Hair thinning and fragile  Eyes: Negative.   Respiratory: Negative for cough, chest tightness and shortness of breath.   Cardiovascular: Negative for chest pain, palpitations and leg swelling.  Gastrointestinal: Negative for abdominal distention, abdominal pain, constipation, diarrhea, nausea and vomiting.  Musculoskeletal: Negative.   Skin: Positive for rash.  Neurological: Negative.   Psychiatric/Behavioral: Negative.       Objective:   Physical Exam  Constitutional: She is oriented to person, place, and time. She appears well-developed and well-nourished.  HENT:  Head: Normocephalic and atraumatic.  Eyes: EOM are normal.  Neck: Normal range of motion.  Cardiovascular: Normal rate and regular rhythm.  Pulmonary/Chest: Effort normal and breath sounds normal. No respiratory distress. She has no wheezes. She has no rales.  Abdominal: Soft. Bowel sounds are normal. She exhibits no distension. There is no tenderness. There is no rebound.  Musculoskeletal: She exhibits no edema.  Neurological: She is alert and oriented to person, place, and time. Coordination normal.  Skin:  Skin is warm and dry.  Psychiatric: She has a normal mood and affect.   Vitals:   12/01/17 1415 12/01/17 1437  BP: (!) 136/92 118/86  Pulse: 85   Temp: 97.9 F (36.6 C)   TempSrc: Oral   SpO2: 95%   Weight: 180 lb (81.6 kg)   Height: 5\' 4"  (1.626 m)       Assessment & Plan:

## 2017-12-01 NOTE — Assessment & Plan Note (Signed)
Rx for triamcinolone cream, appears to be contact dermatitis. No signs of candida infection.

## 2017-12-01 NOTE — Assessment & Plan Note (Signed)
BP is actually improved today, BP close to goal on hydralazine 25 mg TID, metoprolol 25 mg BID, amlodipine 5 mg daily, losartan/hctz 100/12.5 mg daily. There is still room to increase her medications if needed. She is working on diet but struggling some with this.

## 2017-12-01 NOTE — Assessment & Plan Note (Signed)
Could be related to metoprolol however good blood pressure control is important and she has hard to treat hypertension so I would be reluctant to stop this. Will check thyroid, B12, vitamin D, CMP and have her try biotin over the counter. If no improvement can consider medication change.

## 2017-12-01 NOTE — Patient Instructions (Signed)
We have sent in the cream for your foot to use twice a [place].   Try biotin for the hair growth and we will check the vitamin levels today.

## 2017-12-02 ENCOUNTER — Other Ambulatory Visit: Payer: Self-pay | Admitting: Internal Medicine

## 2018-01-11 ENCOUNTER — Other Ambulatory Visit: Payer: Self-pay | Admitting: Internal Medicine

## 2018-01-20 ENCOUNTER — Other Ambulatory Visit: Payer: Self-pay | Admitting: Internal Medicine

## 2018-02-21 ENCOUNTER — Other Ambulatory Visit: Payer: Self-pay | Admitting: Internal Medicine

## 2018-03-09 ENCOUNTER — Encounter: Payer: Self-pay | Admitting: Internal Medicine

## 2018-03-09 ENCOUNTER — Ambulatory Visit (INDEPENDENT_AMBULATORY_CARE_PROVIDER_SITE_OTHER): Payer: Medicare Other | Admitting: Internal Medicine

## 2018-03-09 ENCOUNTER — Other Ambulatory Visit (INDEPENDENT_AMBULATORY_CARE_PROVIDER_SITE_OTHER): Payer: Medicare Other

## 2018-03-09 VITALS — BP 180/120 | HR 97 | Temp 98.3°F | Ht 64.0 in | Wt 175.0 lb

## 2018-03-09 DIAGNOSIS — G4489 Other headache syndrome: Secondary | ICD-10-CM | POA: Diagnosis not present

## 2018-03-09 DIAGNOSIS — J029 Acute pharyngitis, unspecified: Secondary | ICD-10-CM

## 2018-03-09 DIAGNOSIS — I160 Hypertensive urgency: Secondary | ICD-10-CM | POA: Diagnosis not present

## 2018-03-09 LAB — TROPONIN I: TNIDX: 0.04 ug/l (ref 0.00–0.06)

## 2018-03-09 LAB — COMPREHENSIVE METABOLIC PANEL
ALT: 14 U/L (ref 0–35)
AST: 17 U/L (ref 0–37)
Albumin: 4.4 g/dL (ref 3.5–5.2)
Alkaline Phosphatase: 75 U/L (ref 39–117)
BILIRUBIN TOTAL: 0.3 mg/dL (ref 0.2–1.2)
BUN: 10 mg/dL (ref 6–23)
CHLORIDE: 100 meq/L (ref 96–112)
CO2: 34 mEq/L — ABNORMAL HIGH (ref 19–32)
Calcium: 10.2 mg/dL (ref 8.4–10.5)
Creatinine, Ser: 0.93 mg/dL (ref 0.40–1.20)
GFR: 80.23 mL/min (ref >60.00)
GLUCOSE: 100 mg/dL — AB (ref 70–99)
Potassium: 2.9 mEq/L — ABNORMAL LOW (ref 3.5–5.1)
SODIUM: 142 meq/L (ref 135–145)
Total Protein: 7.9 g/dL (ref 6.0–8.3)

## 2018-03-09 MED ORDER — HYDRALAZINE HCL 25 MG PO TABS: ORAL_TABLET | ORAL | 3 refills | Status: DC

## 2018-03-09 MED ORDER — PROMETHAZINE-DM 6.25-15 MG/5ML PO SYRP: 5.0000 mL | ORAL_SOLUTION | Freq: Two times a day (BID) | ORAL | 0 refills | Status: DC | PRN

## 2018-03-09 MED ORDER — FLUTICASONE PROPIONATE 50 MCG/ACT NA SUSP: 2.0000 | Freq: Every day | NASAL | 3 refills | Status: DC | PRN

## 2018-03-09 NOTE — Progress Notes (Signed)
   Subjective:    Patient ID: Hayley Miller, female    DOB: February 16, 1961, 57 y.o.   MRN: [id number]  HPI  The patient is a 57 YO female coming in for concerns about sore throat (accompanied by headaches, nose drainage, cough, chest pain with coughing, non-productive, started 2 days ago, taking tylenol cold products, overall stable since onset, denies SOB) and blood pressure (admits to taking medications daily, cannot recall all medications, denies chest pains, having headaches, some chest pressure from coughing, denies that this feels like NSTEMI earlier this year) and headache (tried tylenol cold product for the sore throat, headache started yesterday, denies vision changes, hearing changes, numbness or weakness).   PMH, [place], social history reviewed and updated  Review of Systems  Constitutional: Positive for activity change, appetite change, chills and fatigue. Negative for fever and unexpected weight change.  HENT: Positive for congestion, postnasal drip, rhinorrhea and sinus pressure. Negative for ear discharge, ear pain, sinus pain, sneezing, sore throat, tinnitus, trouble swallowing and voice change.   Eyes: Negative.   Respiratory: Positive for cough and chest tightness. Negative for shortness of breath and wheezing.   Cardiovascular: Negative.  Negative for chest pain, palpitations and leg swelling.  Gastrointestinal: Negative.  Negative for abdominal distention, abdominal pain, constipation, diarrhea, nausea and vomiting.  Musculoskeletal: Positive for myalgias.  Skin: Negative.   Neurological: Negative.   Psychiatric/Behavioral: Negative.       Objective:   Physical Exam Constitutional:      Appearance: She is well-developed.  HENT:     Head: Normocephalic and atraumatic.     Comments: Oropharynx with redness and clear drainage, nose normal and ears normal.  Neck:     Musculoskeletal: Normal range of motion.  Cardiovascular:     Rate and Rhythm: Normal rate and regular rhythm.    Pulmonary:     Effort: Pulmonary effort is normal. No respiratory distress.     Breath sounds: Normal breath sounds. No wheezing or rales.  Abdominal:     General: Bowel sounds are normal. There is no distension.     Palpations: Abdomen is soft.     Tenderness: There is no abdominal tenderness. There is no rebound.  Lymphadenopathy:     Cervical: No cervical adenopathy.  Skin:    General: Skin is warm and dry.  Neurological:     Mental Status: She is alert and oriented to person, place, and time.     Coordination: Coordination normal.    Vitals:   03/09/18 1429 03/09/18 1455  BP: (!) 190/120 (!) 180/120  Pulse: 97   Temp: 98.3 F (36.8 C)   TempSrc: Oral   SpO2: 98%   Weight: 175 lb (79.4 kg)   Height: 5\' 4"  (1.626 m)    EKG: Rate 95, LAFB, axis stable, intervals normal, sinus, stable ST changes, no significant change when compared to June 2019    Assessment & Plan:

## 2018-03-09 NOTE — Patient Instructions (Addendum)
The EKG looks the same.   We have sent in flonase and cough syrup.   You cannot take any cold medicine unless it is safe for high blood pressure.   I will have you take an extra losartan/hctz today when you get home to help bring the blood pressure down.   We are checking blood work today.

## 2018-03-10 DIAGNOSIS — R519 Headache, unspecified: Secondary | ICD-10-CM | POA: Insufficient documentation

## 2018-03-10 DIAGNOSIS — R51 Headache: Secondary | ICD-10-CM

## 2018-03-10 DIAGNOSIS — J029 Acute pharyngitis, unspecified: Secondary | ICD-10-CM | POA: Insufficient documentation

## 2018-03-10 MED ORDER — POTASSIUM CHLORIDE CRYS ER 20 MEQ PO TBCR: 40.0000 meq | EXTENDED_RELEASE_TABLET | Freq: Every day | ORAL | 0 refills | Status: DC

## 2018-03-10 NOTE — Assessment & Plan Note (Signed)
EKG done in the office without changes. She admits to taking all meds appropriately but has only taken 1 hydralazine today at 4pm which is not appropriate. Also only 1 month supply hydralazine sent in September and not needed refill yet. She is reminded about medications and dosing instructions and the importance of taking meds due to past NSTEMI with hypertensive urgency. Checking CMP and troponin for end organ damage. Has headache from high BP today.

## 2018-03-10 NOTE — Assessment & Plan Note (Signed)
Acute headache from BP high. Given instruction on taking an additional losartan/hctz when she gets home. She cannot take clonidine due to allergy which is the only BP medication kept in the office. Getting labs to rule out end organ damage. No signs of stroke to suggest need for transfer to ER.

## 2018-03-10 NOTE — Assessment & Plan Note (Signed)
Likely viral, no signs of strep on exam. No indication for steroids or antibiotics. Rx for cough medicine today and flonase which she is not taking currently to help resolve symptoms. Advised never to take any cold products again without consultation with pharmacist due to her high BP which may be associated with cold products today.

## 2018-05-26 ENCOUNTER — Telehealth: Payer: Self-pay

## 2018-05-26 NOTE — Telephone Encounter (Signed)
Copied from [place] [phone]. Topic: General - Inquiry >> May 26, 2018  1:17 PM Scherrie Gerlach wrote: Reason for CRM: lonna Durrence with [facility] quality field operations is requesting a demographic sheet on the pt, and the last OV notes from 2019 that gives bp readings. Please fax: Fax  [phone]

## 2018-05-26 NOTE — Telephone Encounter (Signed)
Faxed

## 2018-05-29 NOTE — Telephone Encounter (Signed)
Hayley Miller with [employer] calling because the bp readings sent were not compliant. She is needing bp readings from 2019 that was complaint. 140/80 or lower. Fax: [phone]

## 2018-05-29 NOTE — Telephone Encounter (Signed)
Fax sent with office notes from 11/2017

## 2018-07-26 ENCOUNTER — Other Ambulatory Visit: Payer: Self-pay | Admitting: Internal Medicine

## 2018-10-15 ENCOUNTER — Other Ambulatory Visit: Payer: Self-pay | Admitting: Internal Medicine

## 2018-10-23 ENCOUNTER — Encounter: Payer: Self-pay | Admitting: Gastroenterology

## 2018-10-25 ENCOUNTER — Other Ambulatory Visit: Payer: Self-pay | Admitting: Internal Medicine

## 2018-11-16 ENCOUNTER — Other Ambulatory Visit: Payer: Self-pay

## 2018-11-16 NOTE — Patient Outreach (Signed)
[facility]) Care Management  11/16/2018  Hayley Miller 29-Sep-1960 [id number]   Medication Adherence call to Hayley Miller HIP[employer] Compliant Voice message left with a call back number. Hayley Miller is showing past due on Pravastatin 40 mg and Losartan/Hctz 100/12.5 mg under [place].   [place] [facility] Direct Dial [phone]  Fax [phone] [email or web address]

## 2018-11-17 ENCOUNTER — Other Ambulatory Visit: Payer: Self-pay | Admitting: Internal Medicine

## 2018-11-18 IMAGING — CR DG CHEST 2V
2 series · 2 of 2 positions shown · non-contrast
Comparison: 07/18/2017

CLINICAL DATA: Pt states tingling in left arm for 4 days. No known
injury. Hx of hypertension Ex smoker

EXAM:
CHEST - 2 VIEW

[w chest pa]
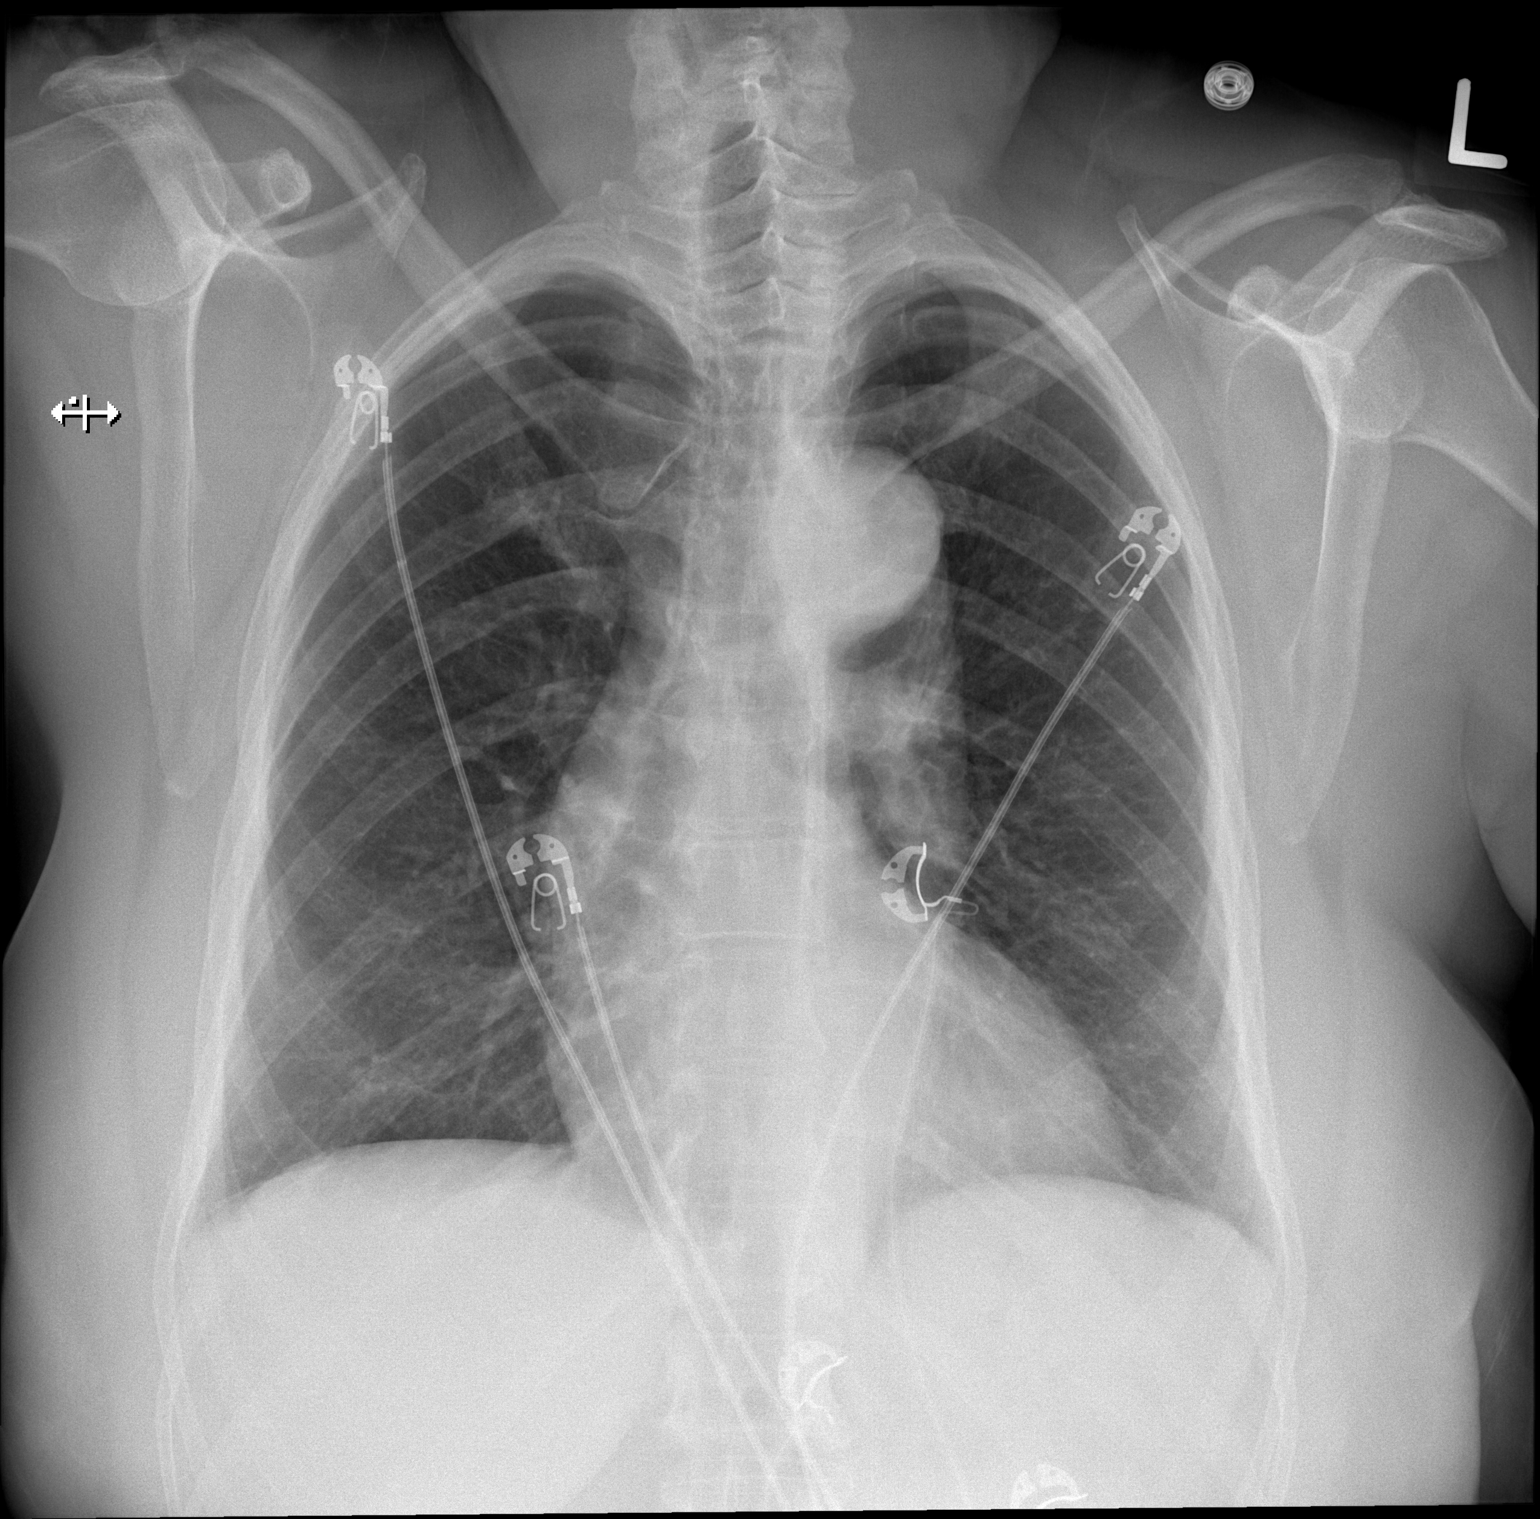

[w chest lat]
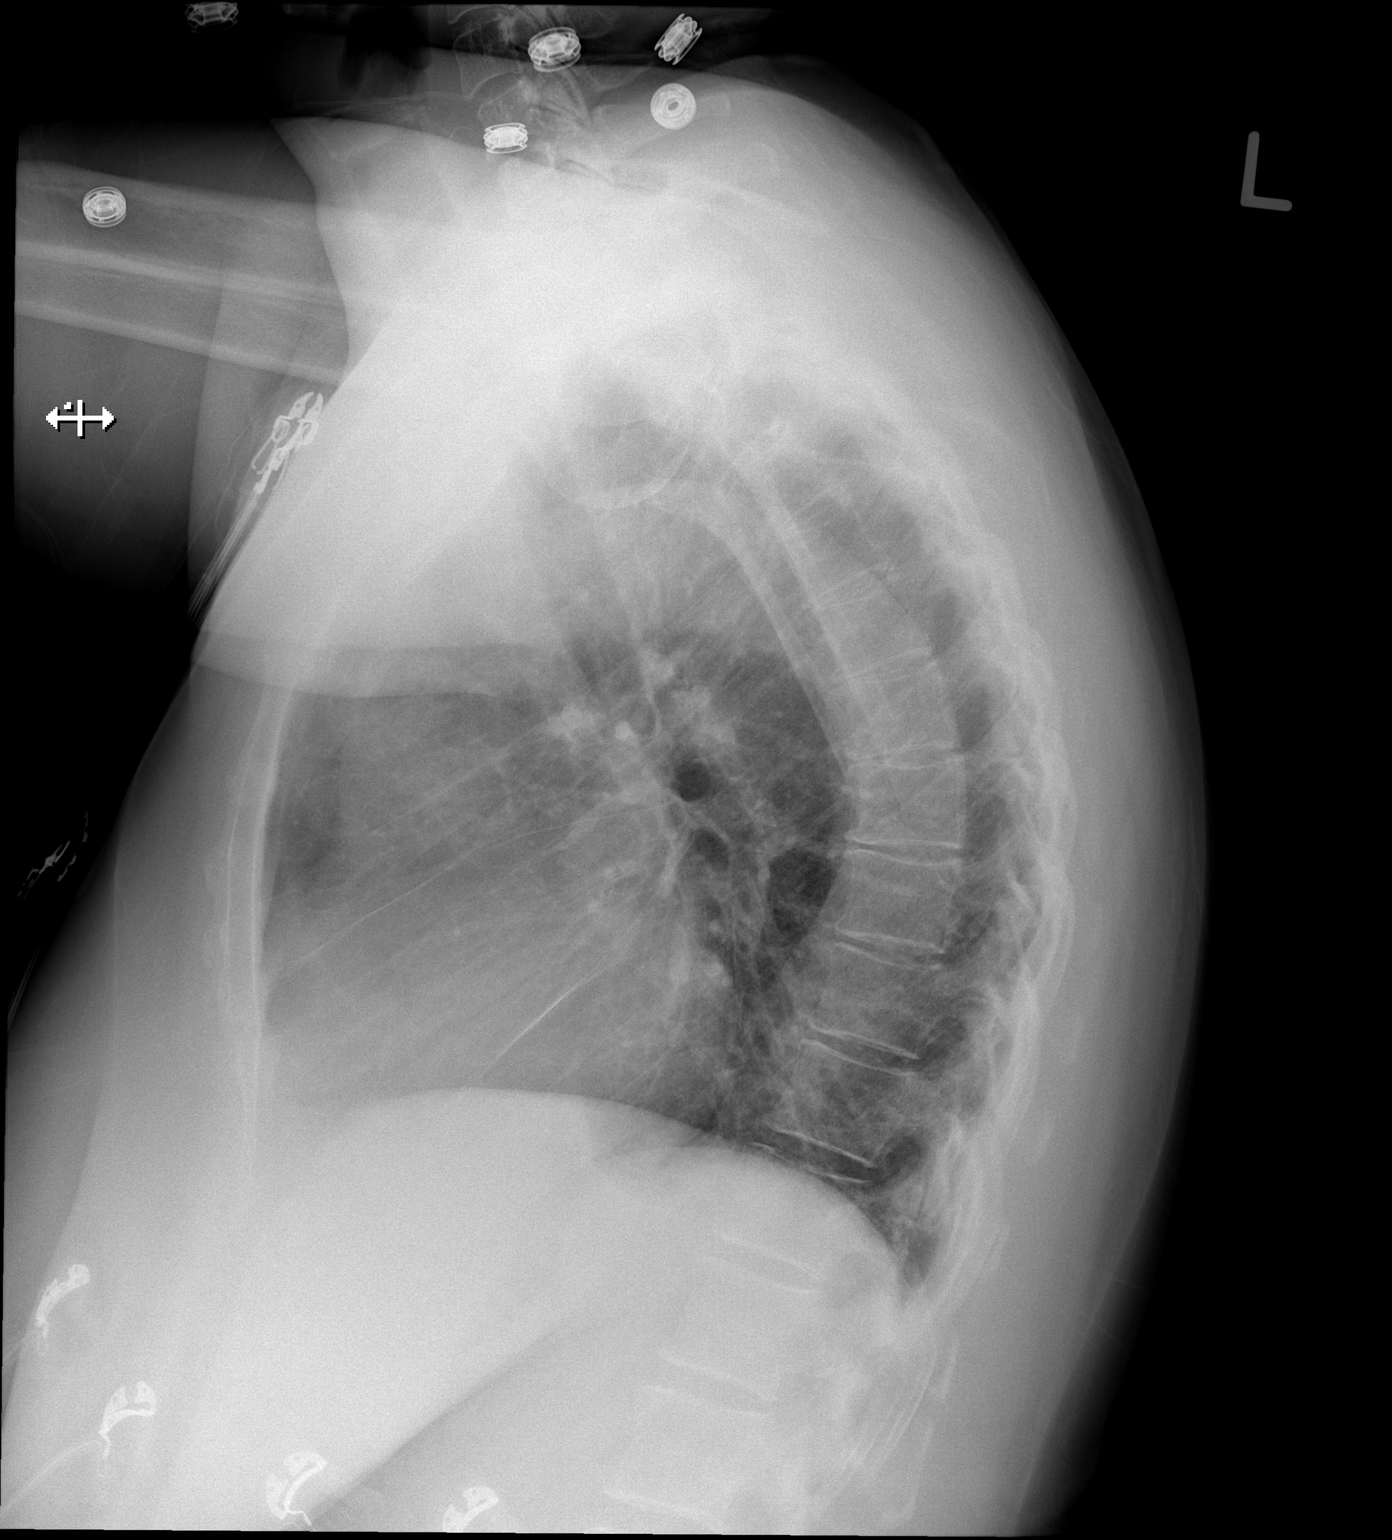

[2 of 2 positions shown; findings below may reference images not displayed]

FINDINGS: Heart size is normal. Aorta is tortuous. There are no focal
consolidations or pleural effusions and no pulmonary edema.
IMPRESSION: No active cardiopulmonary disease.

## 2018-11-28 ENCOUNTER — Other Ambulatory Visit: Payer: Self-pay

## 2018-11-28 NOTE — Patient Outreach (Signed)
[facility]) Care Management  11/28/2018  Hayley Miller April 18, 1960 [id number]   Medication Adherence call to Hayley Miller Demetrius [employer] Compliant Voice message left with a call back number. Hayley Miller is showing past due on Pravastatin 40 mg and Losartan.Hctz 100/12.5 mg under [place].   [facility] Direct Dial [phone]  Fax [phone]  [email or web address]

## 2018-12-07 ENCOUNTER — Other Ambulatory Visit: Payer: Self-pay | Admitting: Internal Medicine

## 2018-12-07 ENCOUNTER — Encounter: Payer: Self-pay | Admitting: Gastroenterology

## 2018-12-07 DIAGNOSIS — Z1231 Encounter for screening mammogram for malignant neoplasm of breast: Secondary | ICD-10-CM

## 2018-12-08 ENCOUNTER — Other Ambulatory Visit: Payer: Self-pay | Admitting: Family

## 2018-12-08 ENCOUNTER — Encounter: Payer: Self-pay | Admitting: Family

## 2018-12-08 ENCOUNTER — Ambulatory Visit (INDEPENDENT_AMBULATORY_CARE_PROVIDER_SITE_OTHER): Payer: Medicare Other | Admitting: Family

## 2018-12-08 ENCOUNTER — Ambulatory Visit: Payer: Medicare Other | Admitting: Family

## 2018-12-08 DIAGNOSIS — I10 Essential (primary) hypertension: Secondary | ICD-10-CM | POA: Diagnosis not present

## 2018-12-08 DIAGNOSIS — K219 Gastro-esophageal reflux disease without esophagitis: Secondary | ICD-10-CM | POA: Diagnosis not present

## 2018-12-08 DIAGNOSIS — R2232 Localized swelling, mass and lump, left upper limb: Secondary | ICD-10-CM | POA: Diagnosis not present

## 2018-12-08 DIAGNOSIS — J309 Allergic rhinitis, unspecified: Secondary | ICD-10-CM | POA: Diagnosis not present

## 2018-12-08 MED ORDER — ESOMEPRAZOLE MAGNESIUM 40 MG PO CPDR: DELAYED_RELEASE_CAPSULE | ORAL | 0 refills | Status: DC

## 2018-12-08 MED ORDER — FLUTICASONE PROPIONATE 50 MCG/ACT NA SUSP: 2.0000 | Freq: Every day | NASAL | 3 refills | Status: DC | PRN

## 2018-12-08 MED ORDER — CETIRIZINE HCL 10 MG PO TABS: 10.0000 mg | ORAL_TABLET | Freq: Every day | ORAL | 0 refills | Status: DC

## 2018-12-08 NOTE — Progress Notes (Signed)
Hayley Miller is a 58 y.o. female with the following history as recorded in EpicCare:  Patient Active Problem List   Diagnosis Date Noted  . Headache 03/10/2018  . Sore throat 03/10/2018  . Hair thinning 12/01/2017  . Rash 12/01/2017  . Hypertensive urgency 07/19/2017  . Other forms of angina pectoris ([place]) 06/02/2016  . Syncope 03/11/2014  . Tinnitus 03/11/2014  . Fibromyalgia 04/18/2009  . NSTEMI (non-ST elevated myocardial infarction) ([place]) 04/15/2009  . LOW BACK PAIN 01/12/2008  . Depression 07/27/2007  . Hyperlipidemia 06/27/2007  . Severe uncontrolled hypertension 06/27/2007  . CAD (coronary artery disease) 06/27/2007  . GERD 06/27/2007    Current Outpatient Medications  Medication Sig Dispense Refill  . acetaminophen (TYLENOL) 325 MG tablet Take 650 mg by mouth every 6 (six) hours as needed for headache.    [name] amLODipine (NORVASC) 5 MG tablet TAKE 1 TABLET BY MOUTH DAILY. 30 tablet 1  . calcium carbonate (OSCAL) 1500 (600 Ca) MG TABS tablet Take 600 mg of elemental calcium by mouth daily with breakfast.     . hydrALAZINE (APRESOLINE) 25 MG tablet TAKE  (1)  TABLET  EVERY EIGHT HOURS. 90 tablet 3  . losartan-hydrochlorothiazide (HYZAAR) 100-12.5 MG tablet Take 1 tablet by mouth daily. Please call and schedule an appt for further refills, 1st attempt 90 tablet 0  . metoprolol tartrate (LOPRESSOR) 25 MG tablet Take 1 tablet (25 mg total) by mouth 2 (two) times daily. 180 tablet 3  . Multiple Vitamin (MULTIVITAMIN WITH MINERALS) TABS tablet Take 1 tablet by mouth daily.    . nitroGLYCERIN (NITROSTAT) 0.4 MG SL tablet Place 1 tablet (0.4 mg total) under the tongue every 5 (five) minutes as needed for chest pain. 5 tablet 0  . pravastatin (PRAVACHOL) 40 MG tablet TAKE (1) TABLET DAILY AT BEDTIME. 90 tablet 0  . aspirin EC 81 MG tablet Take 81 mg by mouth daily.    . cetirizine (ZYRTEC) 10 MG tablet Take 1 tablet (10 mg total) by mouth daily. 30 tablet 0  . esomeprazole (NEXIUM) 40  MG capsule TAKE 1 CAPSULE AT 12:00 NOON EACH [place]. 90 capsule 0  . fluticasone (FLONASE) 50 MCG/ACT nasal spray Place 2 sprays into both nostrils daily as needed for allergies. 16 g 3  . gabapentin (NEURONTIN) 300 MG capsule TAKE (1) CAPSULE THREE TIMES DAILY. (Patient not taking: Reported on 12/08/2018) 90 capsule 3  . potassium chloride SA (K-DUR,KLOR-CON) 20 MEQ tablet Take 2 tablets (40 mEq total) by mouth daily for 5 days. 10 tablet 0  . Tetrahydrozoline HCl (VISINE OP) Place 1 drop into both eyes daily as needed (dry eyes).     No current facility-administered medications for this visit.     Allergies: Clonidine hydrochloride, Codeine, Ibuprofen, and Lovastatin  Past Medical History:  Diagnosis Date  . Anemia   . Anxiety   . CAD (coronary artery disease)    a. s/p AL MI in 2006 tx with PTCA to LAD;  b. LHC (10/2009): lum irregs in LAD, o/w no CAD, EF 55-60%;  c. Myoview (07/2011):  no ischemia, EF 63%;  d. Echo (11/2011):  mild LVH, EF 60-65%, Gr 1 DD;  e.  Lexiscan Myoview (04/2013):  Inferior bowel atten artifact with inferoapical defect; no ischemia; EF 58% (low risk)  . Cameron lesion, acute   . Chronic lower back pain   . Chronic pain syndrome   . Daily headache   . Depression   . Erosive esophagitis   . Fibromyalgia   .  GERD (gastroesophageal reflux disease)   . Hemorrhage    upper gastrointestional. GI bleed 10/09 due to NSAID  . Hiatal hernia   . HLD (hyperlipidemia)   . HTN (hypertension)   . Myocardial infarction ([place]) 2006 X 2  . Rhinitis   . Seasonal allergies     Past Surgical History:  Procedure Laterality Date  . BLADDER SUSPENSION N/A 05/08/2013   Procedure: TRANSVAGINAL TAPE (TVT) PROCEDURE;  Surgeon: Olga Millers, MD;  Location: [facility];  Service: Gynecology;  Laterality: N/A;  . BREAST CYST EXCISION Left 2002  . CARDIAC CATHETERIZATION  07/19/2017  . CORONARY ANGIOPLASTY  2006  . CYSTOSCOPY N/A 05/08/2013   Procedure: CYSTOSCOPY;  Surgeon: Olga Millers,  MD;  Location: [facility];  Service: Gynecology;  Laterality: N/A;  . LAPAROSCOPIC ASSISTED VAGINAL HYSTERECTOMY N/A 05/08/2013   Procedure: LAPAROSCOPIC ASSISTED VAGINAL HYSTERECTOMY;  Surgeon: Olga Millers, MD;  Location: [facility];  Service: Gynecology;  Laterality: N/A;  . LAPAROSCOPIC BILATERAL SALPINGO OOPHERECTOMY N/A 05/08/2013   Procedure: LAPAROSCOPIC BILATERAL SALPINGO OOPHORECTOMY;  Surgeon: Olga Millers, MD;  Location: [facility];  Service: Gynecology;  Laterality: N/A;  . LEFT HEART CATH AND CORONARY ANGIOGRAPHY N/A 07/19/2017   Procedure: LEFT HEART CATH AND CORONARY ANGIOGRAPHY;  Surgeon: Martinique, Peter M, MD;  Location: [facility];  Service: Cardiovascular;  Laterality: N/A;  . UPPER GASTROINTESTINAL ENDOSCOPY      Family History  Problem Relation Age of Onset  . Hypertension Mother   . Heart murmur Mother   . Colon polyps Mother   . Colon cancer Maternal Aunt   . Heart disease Son   . Heart disease Maternal Grandmother   . Diabetes Maternal Aunt   . Esophageal cancer Neg Hx   . Rectal cancer Neg Hx   . Stomach cancer Neg Hx     Social History   Tobacco Use  . Smoking status: Former Smoker    Packs/[place]: 1.00    Years: 3.00    Pack years: 3.00    Types: Cigarettes    Quit date: 04/17/2017    Years since quitting: 1.6  . Smokeless tobacco: Never Used  . Tobacco comment: Divorced, lives with female domestic partner  Substance Use Topics  . Alcohol use: Yes    Comment: 07/20/2017 "might drink once or twice/year"    Subjective:    I connected with Hayley Miller on 12/08/18 at  3:00 PM EDT by a video enabled telemedicine application and verified that I am speaking with the correct person using two identifiers. Patient and I are the only 2 people on the video call.    I discussed the limitations of evaluation and management by telemedicine and the availability of in person appointments. The patient expressed understanding and agreed to proceed.  1) Requesting  refills on her Zyrtec and Flonase; has been having increased allergy symptoms/ sinus congestion; no fever, no shortness of breath; 2) Has noticed a lump in her left armpit region in the past week; overdue for mammogram- last one was done in 2017; Overdue to see her PCP- last OV here was December 2019; asking about scheduling a follow-up on her blood pressure    Objective:  There were no vitals filed for this visit.  General: Well developed, well nourished, in no acute distress  Head: Normocephalic and atraumatic  Lungs: Respirations unlabored;  Neurologic: Alert and oriented; speech intact; face symmetrical;   Assessment:  1. Axillary lump, left   2. Allergic rhinitis,  unspecified seasonality, unspecified trigger   3. Essential hypertension     Plan:  1. Order updated for bilateral diagnostic mammogram; 2. Refills updated on Flonase and Zyrtec; 3. Concern for control- has history of non-compliance; last OV here was 02/2018 and pressure was extremely elevated at that time; she agrees to see her PCP in the next week in the office; 4. Refill updated on Nexium;   No follow-ups on file.  Orders Placed This Encounter  Procedures  . MM Digital Diagnostic Bilat    Standing Status:   Future    Standing Expiration Date:   02/07/2020    Order Specific Question:   Reason for Exam (SYMPTOM  OR DIAGNOSIS REQUIRED)    Answer:   left axillary lump; last mammogram 2017    Order Specific Question:   Is the patient pregnant?    Answer:   No    Order Specific Question:   Preferred imaging location?    Answer:   [facility]    Requested Prescriptions   Signed Prescriptions Disp Refills  . esomeprazole (NEXIUM) 40 MG capsule 90 capsule 0    Sig: TAKE 1 CAPSULE AT 12:00 NOON EACH [place].  . cetirizine (ZYRTEC) 10 MG tablet 30 tablet 0    Sig: Take 1 tablet (10 mg total) by mouth daily.  . fluticasone (FLONASE) 50 MCG/ACT nasal spray 16 g 3    Sig: Place 2 sprays into both nostrils daily as  needed for allergies.

## 2018-12-08 NOTE — Progress Notes (Unsigned)
Hayley Miller is a 58 y.o. female with the following history as recorded in EpicCare:  Patient Active Problem List   Diagnosis Date Noted  . Headache 03/10/2018  . Sore throat 03/10/2018  . Hair thinning 12/01/2017  . Rash 12/01/2017  . Hypertensive urgency 07/19/2017  . Other forms of angina pectoris ([place]) 06/02/2016  . Syncope 03/11/2014  . Tinnitus 03/11/2014  . Fibromyalgia 04/18/2009  . NSTEMI (non-ST elevated myocardial infarction) ([place]) 04/15/2009  . LOW BACK PAIN 01/12/2008  . Depression 07/27/2007  . Hyperlipidemia 06/27/2007  . Severe uncontrolled hypertension 06/27/2007  . CAD (coronary artery disease) 06/27/2007  . GERD 06/27/2007    Current Outpatient Medications  Medication Sig Dispense Refill  . acetaminophen (TYLENOL) 325 MG tablet Take 650 mg by mouth every 6 (six) hours as needed for headache.    [name] amLODipine (NORVASC) 5 MG tablet TAKE 1 TABLET BY MOUTH DAILY. 30 tablet 1  . aspirin EC 81 MG tablet Take 81 mg by mouth daily.    . calcium carbonate (OSCAL) 1500 (600 Ca) MG TABS tablet Take 600 mg of elemental calcium by mouth daily with breakfast.     . cetirizine (ZYRTEC) 10 MG tablet Take 1 tablet (10 mg total) by mouth daily. (Patient taking differently: Take 10 mg by mouth daily as needed for allergies. ) 30 tablet 0  . Cod Liver Oil CAPS Take 1 capsule by mouth daily.    [name] esomeprazole (NEXIUM) 40 MG capsule TAKE 1 CAPSULE AT 12:00 NOON EACH [place]. 30 capsule 0  . fluticasone (FLONASE) 50 MCG/ACT nasal spray Place 2 sprays into both nostrils daily as needed for allergies. 16 g 3  . gabapentin (NEURONTIN) 300 MG capsule TAKE (1) CAPSULE THREE TIMES DAILY. (Patient taking differently: TAKE (1) CAPSULE BY MOUTH THREE TIMES DAILY FOR PAIN) 90 capsule 3  . hydrALAZINE (APRESOLINE) 25 MG tablet TAKE  (1)  TABLET  EVERY EIGHT HOURS. 90 tablet 3  . losartan-hydrochlorothiazide (HYZAAR) 100-12.5 MG tablet Take 1 tablet by mouth daily. Please call and schedule an appt for  further refills, 1st attempt 90 tablet 0  . MAGNESIUM PO Take 1 tablet by mouth daily.    . Menthol-Methyl Salicylate (MUSCLE RUB) 10-15 % CREA Apply 1 application topically daily as needed for muscle pain.    . metoprolol tartrate (LOPRESSOR) 25 MG tablet Take 1 tablet (25 mg total) by mouth 2 (two) times daily. 180 tablet 3  . Multiple Vitamin (MULTIVITAMIN WITH MINERALS) TABS tablet Take 1 tablet by mouth daily.    . nitroGLYCERIN (NITROSTAT) 0.4 MG SL tablet Place 1 tablet (0.4 mg total) under the tongue every 5 (five) minutes as needed for chest pain. 5 tablet 0  . potassium chloride SA (K-DUR,KLOR-CON) 20 MEQ tablet Take 2 tablets (40 mEq total) by mouth daily for 5 days. 10 tablet 0  . pravastatin (PRAVACHOL) 40 MG tablet TAKE (1) TABLET DAILY AT BEDTIME. 90 tablet 0  . promethazine-dextromethorphan (PROMETHAZINE-DM) 6.25-15 MG/5ML syrup Take 5 mLs by mouth 2 (two) times daily as needed for cough. 118 mL 0  . Tetrahydrozoline HCl (VISINE OP) Place 1 drop into both eyes daily as needed (dry eyes).    . triamcinolone cream (KENALOG) 0.1 % Apply 1 application topically 2 (two) times daily. 100 g 0   No current facility-administered medications for this visit.     Allergies: Clonidine hydrochloride, Codeine, Ibuprofen, and Lovastatin  Past Medical History:  Diagnosis Date  . Anemia   . Anxiety   .  CAD (coronary artery disease)    a. s/p AL MI in 2006 tx with PTCA to LAD;  b. LHC (10/2009): lum irregs in LAD, o/w no CAD, EF 55-60%;  c. Myoview (07/2011):  no ischemia, EF 63%;  d. Echo (11/2011):  mild LVH, EF 60-65%, Gr 1 DD;  e.  Lexiscan Myoview (04/2013):  Inferior bowel atten artifact with inferoapical defect; no ischemia; EF 58% (low risk)  . Cameron lesion, acute   . Chronic lower back pain   . Chronic pain syndrome   . Daily headache   . Depression   . Erosive esophagitis   . Fibromyalgia   . GERD (gastroesophageal reflux disease)   . Hemorrhage    upper gastrointestional. GI bleed  10/09 due to NSAID  . Hiatal hernia   . HLD (hyperlipidemia)   . HTN (hypertension)   . Myocardial infarction ([place]) 2006 X 2  . Rhinitis   . Seasonal allergies     Past Surgical History:  Procedure Laterality Date  . BLADDER SUSPENSION N/A 05/08/2013   Procedure: TRANSVAGINAL TAPE (TVT) PROCEDURE;  Surgeon: Olga Millers, MD;  Location: [facility];  Service: Gynecology;  Laterality: N/A;  . BREAST CYST EXCISION Left 2002  . CARDIAC CATHETERIZATION  07/19/2017  . CORONARY ANGIOPLASTY  2006  . CYSTOSCOPY N/A 05/08/2013   Procedure: CYSTOSCOPY;  Surgeon: Olga Millers, MD;  Location: [facility];  Service: Gynecology;  Laterality: N/A;  . LAPAROSCOPIC ASSISTED VAGINAL HYSTERECTOMY N/A 05/08/2013   Procedure: LAPAROSCOPIC ASSISTED VAGINAL HYSTERECTOMY;  Surgeon: Olga Millers, MD;  Location: [facility];  Service: Gynecology;  Laterality: N/A;  . LAPAROSCOPIC BILATERAL SALPINGO OOPHERECTOMY N/A 05/08/2013   Procedure: LAPAROSCOPIC BILATERAL SALPINGO OOPHORECTOMY;  Surgeon: Olga Millers, MD;  Location: [facility];  Service: Gynecology;  Laterality: N/A;  . LEFT HEART CATH AND CORONARY ANGIOGRAPHY N/A 07/19/2017   Procedure: LEFT HEART CATH AND CORONARY ANGIOGRAPHY;  Surgeon: Martinique, Peter M, MD;  Location: [facility];  Service: Cardiovascular;  Laterality: N/A;  . UPPER GASTROINTESTINAL ENDOSCOPY      Family History  Problem Relation Age of Onset  . Hypertension Mother   . Heart murmur Mother   . Colon polyps Mother   . Colon cancer Maternal Aunt   . Heart disease Son   . Heart disease Maternal Grandmother   . Diabetes Maternal Aunt   . Esophageal cancer Neg Hx   . Rectal cancer Neg Hx   . Stomach cancer Neg Hx     Social History   Tobacco Use  . Smoking status: Former Smoker    Packs/[place]: 1.00    Years: 3.00    Pack years: 3.00    Types: Cigarettes    Quit date: 04/17/2017    Years since quitting: 1.6  . Smokeless tobacco: Never Used  . Tobacco comment: Divorced, lives with  female domestic partner  Substance Use Topics  . Alcohol use: Yes    Comment: 07/20/2017 "might drink once or twice/year"    Subjective:    I connected with Aisley Whan Cantrelle on 12/08/18 at 10:20 AM EDT by a video enabled telemedicine application and verified that I am speaking with the correct person using two identifiers.   I discussed the limitations of evaluation and management by telemedicine and the availability of in person appointments. The patient expressed understanding and agreed to proceed.          Objective:  There were no vitals filed for this visit.  General: Well developed,  well nourished, in no acute distress  Skin : Warm and dry.  Head: Normocephalic and atraumatic  Eyes: Sclera and conjunctiva clear; pupils round and reactive to light; extraocular movements intact  Ears: External normal; canals clear; tympanic membranes normal  Oropharynx: Pink, supple. No suspicious lesions  Neck: Supple without thyromegaly, adenopathy  Lungs: Respirations unlabored; clear to auscultation bilaterally without wheeze, rales, rhonchi  CVS exam: {heart exam:315510::"normal rate, regular rhythm, normal S1, S2, no murmurs, rubs, clicks or gallops"}.  Abdomen: Soft; nontender; nondistended; normoactive bowel sounds; no masses or hepatosplenomegaly  Musculoskeletal: No deformities; no active joint inflammation  Extremities: No edema, cyanosis, clubbing  Vessels: Symmetric bilaterally  Neurologic: Alert and oriented; speech intact; face symmetrical; moves all extremities well; CNII-XII intact without focal deficit  Assessment:  No diagnosis found.  Plan:  ***   No follow-ups on file.  No orders of the defined types were placed in this encounter.   Requested Prescriptions    No prescriptions requested or ordered in this encounter

## 2018-12-14 ENCOUNTER — Encounter (HOSPITAL_COMMUNITY): Payer: Self-pay | Admitting: Emergency Medicine

## 2018-12-14 ENCOUNTER — Encounter: Payer: Self-pay | Admitting: Internal Medicine

## 2018-12-14 ENCOUNTER — Inpatient Hospital Stay (HOSPITAL_COMMUNITY)
Admission: EM | Admit: 2018-12-14 | Discharge: 2018-12-17 | DRG: 305 | Disposition: A | Discharge status: Home / Self Care | Payer: Medicare Other | Source: Ambulatory Visit | Attending: Internal Medicine | Admitting: Internal Medicine

## 2018-12-14 ENCOUNTER — Other Ambulatory Visit (INDEPENDENT_AMBULATORY_CARE_PROVIDER_SITE_OTHER): Payer: Medicare Other

## 2018-12-14 ENCOUNTER — Ambulatory Visit (INDEPENDENT_AMBULATORY_CARE_PROVIDER_SITE_OTHER): Payer: Medicare Other | Admitting: Internal Medicine

## 2018-12-14 ENCOUNTER — Other Ambulatory Visit: Payer: Self-pay

## 2018-12-14 ENCOUNTER — Emergency Department (HOSPITAL_COMMUNITY): Payer: Medicare Other

## 2018-12-14 ENCOUNTER — Telehealth: Payer: Self-pay | Admitting: Internal Medicine

## 2018-12-14 VITALS — BP 170/120 | HR 78 | Temp 98.3°F | Ht 64.0 in | Wt 171.0 lb

## 2018-12-14 DIAGNOSIS — J302 Other seasonal allergic rhinitis: Secondary | ICD-10-CM | POA: Diagnosis present

## 2018-12-14 DIAGNOSIS — R079 Chest pain, unspecified: Secondary | ICD-10-CM | POA: Diagnosis not present

## 2018-12-14 DIAGNOSIS — K219 Gastro-esophageal reflux disease without esophagitis: Secondary | ICD-10-CM | POA: Diagnosis present

## 2018-12-14 DIAGNOSIS — R7989 Other specified abnormal findings of blood chemistry: Secondary | ICD-10-CM | POA: Diagnosis not present

## 2018-12-14 DIAGNOSIS — E876 Hypokalemia: Secondary | ICD-10-CM | POA: Diagnosis not present

## 2018-12-14 DIAGNOSIS — I252 Old myocardial infarction: Secondary | ICD-10-CM | POA: Diagnosis not present

## 2018-12-14 DIAGNOSIS — I160 Hypertensive urgency: Secondary | ICD-10-CM

## 2018-12-14 DIAGNOSIS — I340 Nonrheumatic mitral (valve) insufficiency: Secondary | ICD-10-CM | POA: Diagnosis not present

## 2018-12-14 DIAGNOSIS — E785 Hyperlipidemia, unspecified: Secondary | ICD-10-CM

## 2018-12-14 DIAGNOSIS — Z8249 Family history of ischemic heart disease and other diseases of the circulatory system: Secondary | ICD-10-CM | POA: Diagnosis not present

## 2018-12-14 DIAGNOSIS — I10 Essential (primary) hypertension: Secondary | ICD-10-CM | POA: Diagnosis not present

## 2018-12-14 DIAGNOSIS — Z87891 Personal history of nicotine dependence: Secondary | ICD-10-CM | POA: Diagnosis not present

## 2018-12-14 DIAGNOSIS — R51 Headache: Secondary | ICD-10-CM | POA: Diagnosis not present

## 2018-12-14 DIAGNOSIS — I701 Atherosclerosis of renal artery: Secondary | ICD-10-CM

## 2018-12-14 DIAGNOSIS — E7800 Pure hypercholesterolemia, unspecified: Secondary | ICD-10-CM | POA: Diagnosis not present

## 2018-12-14 DIAGNOSIS — I2510 Atherosclerotic heart disease of native coronary artery without angina pectoris: Secondary | ICD-10-CM | POA: Diagnosis present

## 2018-12-14 DIAGNOSIS — Z7982 Long term (current) use of aspirin: Secondary | ICD-10-CM

## 2018-12-14 DIAGNOSIS — Z20828 Contact with and (suspected) exposure to other viral communicable diseases: Secondary | ICD-10-CM | POA: Diagnosis present

## 2018-12-14 DIAGNOSIS — Z03818 Encounter for observation for suspected exposure to other biological agents ruled out: Secondary | ICD-10-CM | POA: Diagnosis not present

## 2018-12-14 DIAGNOSIS — I25110 Atherosclerotic heart disease of native coronary artery with unstable angina pectoris: Secondary | ICD-10-CM | POA: Diagnosis not present

## 2018-12-14 DIAGNOSIS — R778 Other specified abnormalities of plasma proteins: Secondary | ICD-10-CM

## 2018-12-14 DIAGNOSIS — R0789 Other chest pain: Secondary | ICD-10-CM | POA: Diagnosis not present

## 2018-12-14 DIAGNOSIS — Z9861 Coronary angioplasty status: Secondary | ICD-10-CM | POA: Diagnosis not present

## 2018-12-14 LAB — CBC
HCT: 44.5 % (ref 36.0–46.0)
Hemoglobin: 14.9 g/dL (ref 12.0–15.0)
MCHC: 33.4 g/dL (ref 30.0–36.0)
MCV: 93.3 fl (ref 78.0–100.0)
Platelets: 348 10*3/uL (ref 150.0–400.0)
RBC: 4.77 Mil/uL (ref 3.87–5.11)
RDW: 13.5 % (ref 11.5–15.5)
WBC: 8.8 10*3/uL (ref 4.0–10.5)

## 2018-12-14 LAB — COMPREHENSIVE METABOLIC PANEL
ALT: 20 U/L (ref 0–35)
AST: 18 U/L (ref 0–37)
Albumin: 4.4 g/dL (ref 3.5–5.2)
Alkaline Phosphatase: 74 U/L (ref 39–117)
BUN: 20 mg/dL (ref 6–23)
CO2: 30 mEq/L (ref 19–32)
Calcium: 10.8 mg/dL — ABNORMAL HIGH (ref 8.4–10.5)
Chloride: 104 mEq/L (ref 96–112)
Creatinine, Ser: 1.36 mg/dL — ABNORMAL HIGH (ref 0.40–1.20)
GFR: 48.55 mL/min — ABNORMAL LOW (ref >60.00)
Glucose, Bld: 99 mg/dL (ref 70–99)
Potassium: 4.1 mEq/L (ref 3.5–5.1)
Sodium: 141 mEq/L (ref 135–145)
Total Bilirubin: 0.4 mg/dL (ref 0.2–1.2)
Total Protein: 7.7 g/dL (ref 6.0–8.3)

## 2018-12-14 LAB — LIPID PANEL
Cholesterol: 181 mg/dL (ref 0–200)
HDL: 40.1 mg/dL (ref >39.00)
LDL Cholesterol: 101 mg/dL — ABNORMAL HIGH (ref 0–99)
NonHDL: 140.45
Total CHOL/HDL Ratio: 5
Triglycerides: 196 mg/dL — ABNORMAL HIGH (ref 0.0–149.0)
VLDL: 39.2 mg/dL (ref 0.0–40.0)

## 2018-12-14 LAB — APTT: aPTT: 31 seconds (ref 24–36)

## 2018-12-14 LAB — TSH: TSH: 2.04 u[IU]/mL (ref 0.35–4.50)

## 2018-12-14 LAB — PROTIME-INR
INR: 1 (ref 0.8–1.2)
Prothrombin Time: 13.2 seconds (ref 11.4–15.2)

## 2018-12-14 LAB — TROPONIN I (HIGH SENSITIVITY)
High Sens Troponin I: 82 ng/L (ref 2–17)
Troponin I (High Sensitivity): 69 ng/L — ABNORMAL HIGH (ref <18)
Troponin I (High Sensitivity): 70 ng/L — ABNORMAL HIGH (ref <18)

## 2018-12-14 LAB — I-STAT BETA HCG BLOOD, ED (NOT ORDERABLE): I-stat hCG, quantitative: <5 m[IU]/mL (ref <5)

## 2018-12-14 LAB — BRAIN NATRIURETIC PEPTIDE: Pro B Natriuretic peptide (BNP): 61 pg/mL (ref 0.0–100.0)

## 2018-12-14 LAB — HEMOGLOBIN A1C: Hgb A1c MFr Bld: 5.4 % (ref 4.6–6.5)

## 2018-12-14 MED ORDER — ADULT MULTIVITAMIN W/MINERALS CH
1.0000 | ORAL_TABLET | Freq: Every day | ORAL | Status: DC
  Administered 2018-12-15 – 2018-12-17 (×3): 1 via ORAL
  Filled 2018-12-14 (×3): qty 1

## 2018-12-14 MED ORDER — TETRAHYDROZOLINE HCL 0.05 % OP SOLN: 1.0000 [drp] | Freq: Every day | OPHTHALMIC | Status: DC | PRN

## 2018-12-14 MED ORDER — ASPIRIN 81 MG PO CHEW
324.0000 mg | CHEWABLE_TABLET | Freq: Once | ORAL | Status: AC
  Administered 2018-12-14: 324 mg via ORAL
  Filled 2018-12-14: qty 4

## 2018-12-14 MED ORDER — ACETAMINOPHEN 325 MG PO TABS
650.0000 mg | ORAL_TABLET | Freq: Four times a day (QID) | ORAL | Status: DC | PRN
  Administered 2018-12-15 – 2018-12-17 (×4): 650 mg via ORAL
  Filled 2018-12-14 (×4): qty 2

## 2018-12-14 MED ORDER — PRAVASTATIN SODIUM 20 MG PO TABS
40.0000 mg | ORAL_TABLET | Freq: Every day | ORAL | Status: DC
  Administered 2018-12-15 – 2018-12-16 (×2): 40 mg via ORAL
  Filled 2018-12-14 (×2): qty 2

## 2018-12-14 MED ORDER — METOPROLOL TARTRATE 25 MG PO TABS: 25.0000 mg | ORAL_TABLET | Freq: Two times a day (BID) | ORAL | 1 refills | Status: DC

## 2018-12-14 MED ORDER — HEPARIN BOLUS VIA INFUSION
3000.0000 [IU] | Freq: Once | INTRAVENOUS | Status: AC
  Administered 2018-12-15: 3000 [IU] via INTRAVENOUS
  Filled 2018-12-14: qty 3000

## 2018-12-14 MED ORDER — HYDRALAZINE HCL 25 MG PO TABS
25.0000 mg | ORAL_TABLET | Freq: Three times a day (TID) | ORAL | Status: DC
  Administered 2018-12-14 – 2018-12-15 (×2): 25 mg via ORAL
  Filled 2018-12-14 (×2): qty 1

## 2018-12-14 MED ORDER — HEPARIN (PORCINE) 25000 UT/250ML-% IV SOLN
750.0000 [IU]/h | INTRAVENOUS | Status: DC
  Administered 2018-12-15: 750 [IU]/h via INTRAVENOUS
  Filled 2018-12-14: qty 250

## 2018-12-14 MED ORDER — LOSARTAN POTASSIUM-HCTZ 100-25 MG PO TABS: 1.0000 | ORAL_TABLET | Freq: Every day | ORAL | Status: DC

## 2018-12-14 MED ORDER — ADULT MULTIVITAMIN W/MINERALS CH: 1.0000 | ORAL_TABLET | Freq: Every day | ORAL | Status: DC

## 2018-12-14 MED ORDER — FLUTICASONE PROPIONATE 50 MCG/ACT NA SUSP
2.0000 | Freq: Every day | NASAL | Status: DC | PRN
  Administered 2018-12-16 – 2018-12-17 (×2): 2 via NASAL
  Filled 2018-12-14: qty 16

## 2018-12-14 MED ORDER — AMLODIPINE BESYLATE 10 MG PO TABS: 10.0000 mg | ORAL_TABLET | Freq: Every day | ORAL | 1 refills | Status: DC

## 2018-12-14 MED ORDER — PANTOPRAZOLE SODIUM 40 MG PO TBEC
80.0000 mg | DELAYED_RELEASE_TABLET | Freq: Every day | ORAL | Status: DC
  Administered 2018-12-15 – 2018-12-17 (×3): 80 mg via ORAL
  Filled 2018-12-14 (×3): qty 2

## 2018-12-14 MED ORDER — LOSARTAN POTASSIUM 50 MG PO TABS
100.0000 mg | ORAL_TABLET | Freq: Every day | ORAL | Status: DC
  Administered 2018-12-15 – 2018-12-17 (×3): 100 mg via ORAL
  Filled 2018-12-14 (×3): qty 2

## 2018-12-14 MED ORDER — HYDROCHLOROTHIAZIDE 25 MG PO TABS
25.0000 mg | ORAL_TABLET | Freq: Every day | ORAL | Status: DC
  Administered 2018-12-15 – 2018-12-17 (×3): 25 mg via ORAL
  Filled 2018-12-14 (×3): qty 1

## 2018-12-14 MED ORDER — AMLODIPINE BESYLATE 10 MG PO TABS
10.0000 mg | ORAL_TABLET | Freq: Every day | ORAL | Status: DC
  Administered 2018-12-14 – 2018-12-17 (×4): 10 mg via ORAL
  Filled 2018-12-14 (×2): qty 1
  Filled 2018-12-14: qty 2
  Filled 2018-12-14: qty 1

## 2018-12-14 MED ORDER — LABETALOL HCL 5 MG/ML IV SOLN
10.0000 mg | INTRAVENOUS | Status: DC | PRN
  Administered 2018-12-15 (×2): 10 mg via INTRAVENOUS
  Filled 2018-12-14 (×3): qty 4

## 2018-12-14 MED ORDER — HYDRALAZINE HCL 25 MG PO TABS: 25.0000 mg | ORAL_TABLET | Freq: Three times a day (TID) | ORAL | 1 refills | Status: DC

## 2018-12-14 MED ORDER — ASPIRIN EC 81 MG PO TBEC: 81.0000 mg | DELAYED_RELEASE_TABLET | Freq: Every day | ORAL | Status: DC

## 2018-12-14 MED ORDER — ONDANSETRON HCL 4 MG PO TABS: 4.0000 mg | ORAL_TABLET | Freq: Four times a day (QID) | ORAL | Status: DC | PRN

## 2018-12-14 MED ORDER — LOSARTAN POTASSIUM-HCTZ 100-25 MG PO TABS: 1.0000 | ORAL_TABLET | Freq: Every day | ORAL | 3 refills | Status: DC

## 2018-12-14 MED ORDER — ASPIRIN EC 81 MG PO TBEC
81.0000 mg | DELAYED_RELEASE_TABLET | Freq: Every day | ORAL | Status: DC
  Administered 2018-12-15 – 2018-12-17 (×3): 81 mg via ORAL
  Filled 2018-12-14 (×3): qty 1

## 2018-12-14 MED ORDER — ONDANSETRON HCL 4 MG/2ML IJ SOLN
4.0000 mg | Freq: Four times a day (QID) | INTRAMUSCULAR | Status: DC | PRN
  Administered 2018-12-16: 10:00:00 4 mg via INTRAVENOUS
  Filled 2018-12-14: qty 2

## 2018-12-14 MED ORDER — METOPROLOL TARTRATE 25 MG PO TABS
25.0000 mg | ORAL_TABLET | Freq: Two times a day (BID) | ORAL | Status: DC
  Administered 2018-12-14 – 2018-12-15 (×2): 25 mg via ORAL
  Filled 2018-12-14 (×2): qty 1

## 2018-12-14 NOTE — Telephone Encounter (Signed)
Open in error

## 2018-12-14 NOTE — ED Provider Notes (Signed)
[facility] DEPT Provider Note   CSN: [id number] Arrival date & time: 12/14/18  1810     History   Chief Complaint Chief Complaint  Patient presents with  . Abdominal Pain    HPI Hayley Miller is a 58 y.o. female.     HPI Patient was seen by her primary doctor today for routine follow-up.  Labs were ordered and troponin was included because the patient had complained of episodic chest pain.  She also has been intermittently compliant with her hypertensive medications.  Troponin came back elevated.  She was advised to come to the emergency department.  Patient currently denies any chest pain.  She denies any shortness of breath.  No new lower extremity swelling or pain. Past Medical History:  Diagnosis Date  . Anemia   . Anxiety   . CAD (coronary artery disease)    a. s/p AL MI in 2006 tx with PTCA to LAD;  b. LHC (10/2009): lum irregs in LAD, o/w no CAD, EF 55-60%;  c. Myoview (07/2011):  no ischemia, EF 63%;  d. Echo (11/2011):  mild LVH, EF 60-65%, Gr 1 DD;  e.  Lexiscan Myoview (04/2013):  Inferior bowel atten artifact with inferoapical defect; no ischemia; EF 58% (low risk)  . Cameron lesion, acute   . Chronic lower back pain   . Chronic pain syndrome   . Daily headache   . Depression   . Erosive esophagitis   . Fibromyalgia   . GERD (gastroesophageal reflux disease)   . Hemorrhage    upper gastrointestional. GI bleed 10/09 due to NSAID  . Hiatal hernia   . HLD (hyperlipidemia)   . HTN (hypertension)   . Myocardial infarction ([place]) 2006 X 2  . Rhinitis   . Seasonal allergies     Patient Active Problem List   Diagnosis Date Noted  . Headache 03/10/2018  . Sore throat 03/10/2018  . Hair thinning 12/01/2017  . Rash 12/01/2017  . Hypertensive urgency 07/19/2017  . Other forms of angina pectoris ([place]) 06/02/2016  . Syncope 03/11/2014  . Tinnitus 03/11/2014  . Fibromyalgia 04/18/2009  . NSTEMI (non-ST elevated myocardial infarction)  ([place]) 04/15/2009  . LOW BACK PAIN 01/12/2008  . Depression 07/27/2007  . Hyperlipidemia 06/27/2007  . Severe uncontrolled hypertension 06/27/2007  . CAD (coronary artery disease) 06/27/2007  . GERD 06/27/2007    Past Surgical History:  Procedure Laterality Date  . BLADDER SUSPENSION N/A 05/08/2013   Procedure: TRANSVAGINAL TAPE (TVT) PROCEDURE;  Surgeon: Olga Millers, MD;  Location: [facility];  Service: Gynecology;  Laterality: N/A;  . BREAST CYST EXCISION Left 2002  . CARDIAC CATHETERIZATION  07/19/2017  . CORONARY ANGIOPLASTY  2006  . CYSTOSCOPY N/A 05/08/2013   Procedure: CYSTOSCOPY;  Surgeon: Olga Millers, MD;  Location: [facility];  Service: Gynecology;  Laterality: N/A;  . LAPAROSCOPIC ASSISTED VAGINAL HYSTERECTOMY N/A 05/08/2013   Procedure: LAPAROSCOPIC ASSISTED VAGINAL HYSTERECTOMY;  Surgeon: Olga Millers, MD;  Location: [facility];  Service: Gynecology;  Laterality: N/A;  . LAPAROSCOPIC BILATERAL SALPINGO OOPHERECTOMY N/A 05/08/2013   Procedure: LAPAROSCOPIC BILATERAL SALPINGO OOPHORECTOMY;  Surgeon: Olga Millers, MD;  Location: [facility];  Service: Gynecology;  Laterality: N/A;  . LEFT HEART CATH AND CORONARY ANGIOGRAPHY N/A 07/19/2017   Procedure: LEFT HEART CATH AND CORONARY ANGIOGRAPHY;  Surgeon: Martinique, Peter M, MD;  Location: [facility];  Service: Cardiovascular;  Laterality: N/A;  . UPPER GASTROINTESTINAL ENDOSCOPY       OB History  No obstetric history on file.      Home Medications    Prior to Admission medications   Medication Sig Start Date End Date Taking? Authorizing Provider  acetaminophen (TYLENOL) 325 MG tablet Take 650 mg by mouth every 6 (six) hours as needed for headache.   Yes [provider]  amLODipine (NORVASC) 10 MG tablet Take 1 tablet (10 mg total) by mouth daily. 12/14/18  Yes Hoyt Koch, MD  aspirin EC 81 MG tablet Take 81 mg by mouth daily.   Yes [provider]  cetirizine (ZYRTEC) 10 MG tablet Take 1 tablet  (10 mg total) by mouth daily. 12/08/18  Yes Marrian Salvage, FNP  esomeprazole (NEXIUM) 40 MG capsule TAKE 1 CAPSULE AT 12:00 NOON EACH [place]. Patient taking differently: Take 40 mg by mouth daily at 12 noon.  12/08/18  Yes Marrian Salvage, FNP  fluticasone [facility]) 50 MCG/ACT nasal spray Place 2 sprays into both nostrils daily as needed for allergies. 12/08/18  Yes Marrian Salvage, FNP  hydrALAZINE (APRESOLINE) 25 MG tablet Take 1 tablet (25 mg total) by mouth 3 (three) times daily. 12/14/18  Yes Hoyt Koch, MD  losartan-hydrochlorothiazide (HYZAAR) 100-25 MG tablet Take 1 tablet by mouth daily. 12/14/18  Yes Hoyt Koch, MD  metoprolol tartrate (LOPRESSOR) 25 MG tablet Take 1 tablet (25 mg total) by mouth 2 (two) times daily. 12/14/18  Yes Hoyt Koch, MD  Multiple Vitamin (MULTIVITAMIN WITH MINERALS) TABS tablet Take 1 tablet by mouth daily.   Yes [provider]  nitroGLYCERIN (NITROSTAT) 0.4 MG SL tablet Place 1 tablet (0.4 mg total) under the tongue every 5 (five) minutes as needed for chest pain. 04/29/17  Yes Hoyt Koch, MD  pravastatin (PRAVACHOL) 40 MG tablet TAKE (1) TABLET DAILY AT BEDTIME. Patient taking differently: Take 40 mg by mouth daily.  10/25/18  Yes Hoyt Koch, MD  Tetrahydrozoline HCl (VISINE OP) Place 1 drop into both eyes daily as needed (dry eyes).   Yes [provider]    Family History Family History  Problem Relation Age of Onset  . Hypertension Mother   . Heart murmur Mother   . Colon polyps Mother   . Colon cancer Maternal Aunt   . Heart disease Son   . Heart disease Maternal Grandmother   . Diabetes Maternal Aunt   . Esophageal cancer Neg Hx   . Rectal cancer Neg Hx   . Stomach cancer Neg Hx     Social History Social History   Tobacco Use  . Smoking status: Former Smoker    Packs/Hollerbach: 1.00    Years: 3.00    Pack years: 3.00    Types: Cigarettes    Quit date:  04/17/2017    Years since quitting: 1.6  . Smokeless tobacco: Never Used  . Tobacco comment: Divorced, lives with female domestic partner  Substance Use Topics  . Alcohol use: Yes    Comment: 07/20/2017 "might drink once or twice/year"  . Drug use: No     Allergies   Clonidine hydrochloride, Codeine, Ibuprofen, and Lovastatin   Review of Systems Review of Systems  Constitutional: Negative for chills and fever.  HENT: Negative for sinus pressure and trouble swallowing.   Eyes: Negative for visual disturbance.  Respiratory: Negative for cough and shortness of breath.   Cardiovascular: Negative for chest pain, palpitations and leg swelling.  Gastrointestinal: Negative for abdominal pain, constipation, diarrhea, nausea and vomiting.  Musculoskeletal: Negative for back pain,  myalgias and neck pain.  Skin: Negative for rash and wound.  Neurological: Negative for dizziness, weakness, light-headedness, numbness and headaches.  All other systems reviewed and are negative.    Physical Exam Updated Vital Signs BP (!) 192/126   Pulse 87   Temp 98.1 F (36.7 C) (Oral)   Resp 16   LMP 06/07/2012   SpO2 97%   Physical Exam Vitals signs and nursing note reviewed.  Constitutional:      Appearance: Normal appearance. She is well-developed.  HENT:     Head: Normocephalic and atraumatic.     Nose: Nose normal.     Mouth/Throat:     Mouth: Mucous membranes are moist.  Eyes:     Pupils: Pupils are equal, round, and reactive to light.  Neck:     Musculoskeletal: Normal range of motion and neck supple.  Cardiovascular:     Rate and Rhythm: Normal rate and regular rhythm.     Heart sounds: No murmur. No friction rub. No gallop.   Pulmonary:     Effort: Pulmonary effort is normal. No respiratory distress.     Breath sounds: Normal breath sounds. No stridor. No wheezing, rhonchi or rales.  Chest:     Chest wall: No tenderness.  Abdominal:     General: Bowel sounds are normal.      Palpations: Abdomen is soft.     Tenderness: There is no abdominal tenderness. There is no right CVA tenderness, left CVA tenderness, guarding or rebound.  Musculoskeletal: Normal range of motion.        General: No swelling, tenderness, deformity or signs of injury.     Right lower leg: No edema.     Left lower leg: No edema.  Skin:    General: Skin is warm and dry.     Capillary Refill: Capillary refill takes less than 2 seconds.     Findings: No erythema or rash.  Neurological:     General: No focal deficit present.     Mental Status: She is alert and oriented to person, place, and time.  Psychiatric:        Behavior: Behavior normal.      ED Treatments / Results  Labs (all labs ordered are listed, but only abnormal results are displayed) Labs Reviewed  TROPONIN I (HIGH SENSITIVITY) - Abnormal; Notable for the following components:      Result Value   Troponin I (High Sensitivity) 69 (*)    All other components within normal limits  TROPONIN I (HIGH SENSITIVITY) - Abnormal; Notable for the following components:   Troponin I (High Sensitivity) 70 (*)    All other components within normal limits  I-STAT BETA HCG BLOOD, ED (MC, WL, AP ONLY)  I-STAT BETA HCG BLOOD, ED (NOT ORDERABLE)    EKG EKG Interpretation  Date/Time:  Thursday December 14 2018 18:20:30 EDT Ventricular Rate:  84 PR Interval:    QRS Duration: 106 QT Interval:  356 QTC Calculation: 421 R Axis:   -67 Text Interpretation:  Sinus rhythm Left anterior fascicular block Low voltage, precordial leads Anteroseptal infarct, old Confirmed by Julianne Rice [phone]) on 12/14/2018 7:17:54 PM   Radiology Dg Chest 2 View  Result Date: 12/14/2018 CLINICAL DATA:  Pt reports that she had doctors appt today and had lab work done then called and told to go to ED for elevated lab. In chart reads troponin was in 80s. Hx NSTEMI. Denies pains. EXAM: CHEST - 2 VIEW COMPARISON:  09/22/2017 FINDINGS: Cardiac silhouette  is  normal in size. No mediastinal or hilar masses. No evidence of adenopathy. Lungs are hyperexpanded but clear. No pleural effusion or pneumothorax. Skeletal structures are intact. IMPRESSION: No active cardiopulmonary disease. Electronically Signed   By: Lajean Manes M.D.   On: 12/14/2018 19:08    Procedures Procedures (including critical care time)  Medications Ordered in ED Medications  hydrALAZINE (APRESOLINE) tablet 25 mg (has no administration in time range)  losartan-hydrochlorothiazide (HYZAAR) 100-25 MG per tablet 1 tablet (has no administration in time range)  metoprolol tartrate (LOPRESSOR) tablet 25 mg (has no administration in time range)  tetrahydrozoline 0.05 % ophthalmic solution 1 drop (has no administration in time range)  pravastatin (PRAVACHOL) tablet 40 mg (has no administration in time range)  pantoprazole (PROTONIX) EC tablet 80 mg (has no administration in time range)  fluticasone (FLONASE) 50 MCG/ACT nasal spray 2 spray (has no administration in time range)  amLODipine (NORVASC) tablet 10 mg (has no administration in time range)  acetaminophen (TYLENOL) tablet 650 mg (has no administration in time range)  labetalol (NORMODYNE) injection 10-20 mg (has no administration in time range)  aspirin EC tablet 81 mg (has no administration in time range)  multivitamin with minerals tablet 1 tablet (has no administration in time range)  aspirin chewable tablet 324 mg (324 mg Oral Given 12/14/18 1952)     Initial Impression / Assessment and Plan / ED Course  I have reviewed the triage vital signs and the nursing notes.  Pertinent labs & imaging results that were available during my care of the patient were reviewed by me and considered in my medical decision making (see chart for details).        No evidence of ischemic changes on EKG.  Repeat troponin has dropped to 69.  Will discuss with cardiology regarding disposition.  Given aspirin in emergency department.  Patient  remains asymptomatic.  Phone and is trending down.  Discussed with cardiology.  Recommends admission to medicine and likely transfer to [facility] for possible cath tomorrow.  Discussed with hospitalist who will see patient in the emergency department and admit. Final Clinical Impressions(s) / ED Diagnoses   Final diagnoses:  Elevated troponin    ED Discharge Orders    None       Julianne Rice, MD 12/14/18 2203

## 2018-12-14 NOTE — H&P (Signed)
History and Physical    Hayley Miller HER:[id number] DOB: [birth year] DOA: 12/14/2018  PCP: Hoyt Koch, MD  Patient coming from: Home  I have personally briefly reviewed patient's old medical records in [place]  Chief Complaint: CP  HPI: Hayley Miller is a 58 y.o. female with medical history significant of HTN, CAD: AL MI in 2006 PTCA to LAD, though most recent [place] in 06/2017 showed normal coronaries without any mention of disease.  She presents to the ED today for concerns of BP elevation.  She was seen at PCPs office office today for HTN.  She notes recurrent headaches over the past few days and intermittent episodes of CP, most recently 2 days ago.  Improved with NTG.  PCP refilled meds and was going to start amlodipine 10mg  additionally.  BP was 180/120 at PCPs office and trop of 82.  Patient was sent in to ED.   ED Course: CP free, repeat trops were 69 and 70.  BP 190s/120s, and running as high as 200/140!   Review of Systems: As per HPI, otherwise all review of systems negative.  Past Medical History:  Diagnosis Date  . Anemia   . Anxiety   . CAD (coronary artery disease)    a. s/p AL MI in 2006 tx with PTCA to LAD;  b. LHC (10/2009): lum irregs in LAD, o/w no CAD, EF 55-60%;  c. Myoview (07/2011):  no ischemia, EF 63%;  d. Echo (11/2011):  mild LVH, EF 60-65%, Gr 1 DD;  e.  Lexiscan Myoview (04/2013):  Inferior bowel atten artifact with inferoapical defect; no ischemia; EF 58% (low risk)  . Cameron lesion, acute   . Chronic lower back pain   . Chronic pain syndrome   . Daily headache   . Depression   . Erosive esophagitis   . Fibromyalgia   . GERD (gastroesophageal reflux disease)   . Hemorrhage    upper gastrointestional. GI bleed 10/09 due to NSAID  . Hiatal hernia   . HLD (hyperlipidemia)   . HTN (hypertension)   . Myocardial infarction ([place]) 2006 X 2  . Rhinitis   . Seasonal allergies     Past Surgical History:  Procedure Laterality Date  .  BLADDER SUSPENSION N/A 05/08/2013   Procedure: TRANSVAGINAL TAPE (TVT) PROCEDURE;  Surgeon: Olga Millers, MD;  Location: [facility];  Service: Gynecology;  Laterality: N/A;  . BREAST CYST EXCISION Left 2002  . CARDIAC CATHETERIZATION  07/19/2017  . CORONARY ANGIOPLASTY  2006  . CYSTOSCOPY N/A 05/08/2013   Procedure: CYSTOSCOPY;  Surgeon: Olga Millers, MD;  Location: [facility];  Service: Gynecology;  Laterality: N/A;  . LAPAROSCOPIC ASSISTED VAGINAL HYSTERECTOMY N/A 05/08/2013   Procedure: LAPAROSCOPIC ASSISTED VAGINAL HYSTERECTOMY;  Surgeon: Olga Millers, MD;  Location: [facility];  Service: Gynecology;  Laterality: N/A;  . LAPAROSCOPIC BILATERAL SALPINGO OOPHERECTOMY N/A 05/08/2013   Procedure: LAPAROSCOPIC BILATERAL SALPINGO OOPHORECTOMY;  Surgeon: Olga Millers, MD;  Location: [facility];  Service: Gynecology;  Laterality: N/A;  . LEFT HEART CATH AND CORONARY ANGIOGRAPHY N/A 07/19/2017   Procedure: LEFT HEART CATH AND CORONARY ANGIOGRAPHY;  Surgeon: Martinique, Peter M, MD;  Location: [facility];  Service: Cardiovascular;  Laterality: N/A;  . UPPER GASTROINTESTINAL ENDOSCOPY       reports that she quit smoking about 19 months ago. Her smoking use included cigarettes. She has a 3.00 pack-year smoking history. She has never used smokeless tobacco. She reports current alcohol use. She reports  that she does not use drugs.  Allergies  Allergen Reactions  . Clonidine Hydrochloride Other (See Comments)    REACTION: FATIGUE  . Codeine Other (See Comments)    Unknown reaction (patient reports taking cough syrup with codeine)  . Ibuprofen Other (See Comments)    GI bleed  . Lovastatin Other (See Comments)    Join pain    Family History  Problem Relation Age of Onset  . Hypertension Mother   . Heart murmur Mother   . Colon polyps Mother   . Colon cancer Maternal Aunt   . Heart disease Son   . Heart disease Maternal Grandmother   . Diabetes Maternal Aunt   . Esophageal cancer Neg Hx   .  Rectal cancer Neg Hx   . Stomach cancer Neg Hx      Prior to Admission medications   Medication Sig Start Date End Date Taking? Authorizing Provider  acetaminophen (TYLENOL) 325 MG tablet Take 650 mg by mouth every 6 (six) hours as needed for headache.   Yes [provider]  amLODipine (NORVASC) 10 MG tablet Take 1 tablet (10 mg total) by mouth daily. 12/14/18  Yes Hoyt Koch, MD  aspirin EC 81 MG tablet Take 81 mg by mouth daily.   Yes [provider]  cetirizine (ZYRTEC) 10 MG tablet Take 1 tablet (10 mg total) by mouth daily. 12/08/18  Yes Marrian Salvage, FNP  esomeprazole (NEXIUM) 40 MG capsule TAKE 1 CAPSULE AT 12:00 NOON EACH [place]. Patient taking differently: Take 40 mg by mouth daily at 12 noon.  12/08/18  Yes Marrian Salvage, FNP  fluticasone [facility]) 50 MCG/ACT nasal spray Place 2 sprays into both nostrils daily as needed for allergies. 12/08/18  Yes Marrian Salvage, FNP  hydrALAZINE (APRESOLINE) 25 MG tablet Take 1 tablet (25 mg total) by mouth 3 (three) times daily. 12/14/18  Yes Hoyt Koch, MD  losartan-hydrochlorothiazide (HYZAAR) 100-25 MG tablet Take 1 tablet by mouth daily. 12/14/18  Yes Hoyt Koch, MD  metoprolol tartrate (LOPRESSOR) 25 MG tablet Take 1 tablet (25 mg total) by mouth 2 (two) times daily. 12/14/18  Yes Hoyt Koch, MD  Multiple Vitamin (MULTIVITAMIN WITH MINERALS) TABS tablet Take 1 tablet by mouth daily.   Yes [provider]  nitroGLYCERIN (NITROSTAT) 0.4 MG SL tablet Place 1 tablet (0.4 mg total) under the tongue every 5 (five) minutes as needed for chest pain. 04/29/17  Yes Hoyt Koch, MD  pravastatin (PRAVACHOL) 40 MG tablet TAKE (1) TABLET DAILY AT BEDTIME. Patient taking differently: Take 40 mg by mouth daily.  10/25/18  Yes Hoyt Koch, MD  Tetrahydrozoline HCl (VISINE OP) Place 1 drop into both eyes daily as needed (dry eyes).   Yes [provider]    Physical Exam: Vitals:   12/14/18 2130 12/14/18 2200 12/14/18 2230 12/14/18 2253  BP: (!) 192/126 (!) 195/121 (!) 203/143   Pulse: 87 88 91 87  Resp: 16  18   Temp:      TempSrc:      SpO2: 97% 97% 98%     Constitutional: NAD, calm, comfortable Eyes: PERRL, lids and conjunctivae normal ENMT: Mucous membranes are moist. Posterior pharynx clear of any exudate or lesions.Normal dentition.  Neck: normal, supple, no masses, no thyromegaly Respiratory: clear to auscultation bilaterally, no wheezing, no crackles. Normal respiratory effort. No accessory muscle use.  Cardiovascular: Regular rate and rhythm, no murmurs / rubs / gallops. No extremity edema. 2+  pedal pulses. No carotid bruits.  Abdomen: no tenderness, no masses palpated. No hepatosplenomegaly. Bowel sounds positive.  Musculoskeletal: no clubbing / cyanosis. No joint deformity upper and lower extremities. Good ROM, no contractures. Normal muscle tone.  Skin: no rashes, lesions, ulcers. No induration Neurologic: CN 2-12 grossly intact. Sensation intact, DTR normal. Strength 5/5 in all 4.  Psychiatric: Normal judgment and insight. Alert and oriented x 3. Normal mood.    Labs on Admission: I have personally reviewed following labs and imaging studies  CBC: Recent Labs  Lab 12/14/18 1620  WBC 8.8  HGB 14.9  HCT 44.5  MCV 93.3  PLT 789.3   Basic Metabolic Panel: Recent Labs  Lab 12/14/18 1620  NA 141  K 4.1  CL 104  CO2 30  GLUCOSE 99  BUN 20  CREATININE 1.36*  CALCIUM 10.8*   GFR: Estimated Creatinine Clearance: 46.6 mL/min (A) (by C-G formula based on SCr of 1.36 mg/dL (H)). Liver Function Tests: Recent Labs  Lab 12/14/18 1620  AST 18  ALT 20  ALKPHOS 74  BILITOT 0.4  PROT 7.7  ALBUMIN 4.4   No results for input(s): LIPASE, AMYLASE in the last 168 hours. No results for input(s): AMMONIA in the last 168 hours. Coagulation Profile: No results for input(s): INR, PROTIME in the last 168 hours.  Cardiac Enzymes: No results for input(s): CKTOTAL, CKMB, CKMBINDEX, TROPONINI in the last 168 hours. BNP (last 3 results) Recent Labs    12/14/18 1620  PROBNP 61.0   HbA1C: Recent Labs    12/14/18 1620  HGBA1C 5.4   CBG: No results for input(s): GLUCAP in the last 168 hours. Lipid Profile: Recent Labs    12/14/18 1620  CHOL 181  HDL 40.10  LDLCALC 101*  TRIG 196.0*  CHOLHDL 5   Thyroid Function Tests: Recent Labs    12/14/18 1620  TSH 2.04   Anemia Panel: No results for input(s): VITAMINB12, FOLATE, FERRITIN, TIBC, IRON, RETICCTPCT in the last 72 hours. Urine analysis:    Component Value Date/Time   COLORURINE YELLOW 09/03/2008 2133   APPEARANCEUR CLOUDY (A) 09/03/2008 2133   LABSPEC 1.024 09/03/2008 2133   PHURINE 7.5 09/03/2008 2133   GLUCOSEU NEGATIVE 09/03/2008 2133   GLUCOSEU NEGATIVE 07/03/2008 1527   HGBUR SMALL (A) 09/03/2008 2133   BILIRUBINUR NEGATIVE 09/03/2008 2133   KETONESUR 15 (A) 09/03/2008 2133   PROTEINUR NEGATIVE 09/03/2008 2133   UROBILINOGEN 0.2 09/03/2008 2133   NITRITE NEGATIVE 09/03/2008 2133   LEUKOCYTESUR NEGATIVE 09/03/2008 2133    Radiological Exams on Admission: Dg Chest 2 View  Result Date: 12/14/2018 CLINICAL DATA:  Pt reports that she had doctors appt today and had lab work done then called and told to go to ED for elevated lab. In chart reads troponin was in 80s. Hx NSTEMI. Denies pains. EXAM: CHEST - 2 VIEW COMPARISON:  09/22/2017 FINDINGS: Cardiac silhouette is normal in size. No mediastinal or hilar masses. No evidence of adenopathy. Lungs are hyperexpanded but clear. No pleural effusion or pneumothorax. Skeletal structures are intact. IMPRESSION: No active cardiopulmonary disease. Electronically Signed   By: Lajean Manes M.D.   On: 12/14/2018 19:08    EKG: Independently reviewed.  Assessment/Plan Principal Problem:   Hypertensive urgency Active Problems:   Severe uncontrolled hypertension   CAD (coronary artery  disease)    1. HTN urgency - 1. Resume all home BP meds 2. Starting amlodipine 10mg  PO daily with first dose this evening 3. PRN labetalol if BP remains uncontrolled after  the amlodipine, evening hydralazine and metoprolol. 4. Tele monitor 5. Cards called by EDP, they were concerned she may need ischemic ruleout: 1. Patient given ASA 325 2. Started on heparin gtt 3. No beds available [facility] at [facility] so decision made to keep patient here in tele bed for tonight 4. Start NTG if patient becomes symptomatic / has CP, but for now trying to control BP with other HTN meds and beta blockers 2. H/o CAD - 1. AL MI in 2006 s/p PTCA no stent 2. However, most recent [facility] April of 2019 showed "Normal coronary anatomy" 3. Continue ASA 81 4. Continue statin 5. BP control as above 6. Cards discussion as above 7. Checking serial trops 8. Tele monitor  DVT prophylaxis: Lovenox Code Status: Full Family Communication: No family in room Disposition Plan: Home after admit Consults called: Spoke with Cardiology, see also their note Admission status: Place in obs     , Time Hospitalists  How to contact the [facility] Attending or Consulting provider [place] or covering provider during after hours [place], for this patient?  1. Check the care team in [facility] and look for a) attending/consulting [facility] provider listed and b) the [facility] team listed 2. Log into [email or web address]  Amion Physician Scheduling and messaging for groups and whole hospitals  On call and physician scheduling software for group practices, residents, hospitalists and other medical providers for call, clinic, rotation and shift schedules. OnCall [employer] is a hospital-wide system for scheduling doctors and paging doctors on call. EasyPlot is for scientific plotting and data analysis.  [email or web address]  and use [place]'s universal password to access. If you do not have the password, please contact the hospital operator.  3.  Locate the [facility] provider you are looking for under [facility] and page to a number that you can be directly reached. 4. If you still have difficulty reaching the provider, please page the [facility] (Director on Call) for the Hospitalists listed on amion for assistance.  12/14/2018, 11:05 PM

## 2018-12-14 NOTE — Assessment & Plan Note (Signed)
EKG done today with chronic changes but no acute changes. Checking troponin given episode of chest discomfort which responded to nitro about 2 days ago. Had a long discussion with her about the reality that she cannot have normal blood pressure without taking blood pressure medicine. She has not filled most of her meds appropriately. We did call pharmacy and verify last fill (see note) of all meds and she is not filling any of them appropriately. She has had NSTEMI before due to BP urgency and she is at high risk for this again. She is aware of the serious nature of her high blood pressure and is insistent that she is taking meds. Iniitially she stated she was filled appropriately and then when presented with the information from pharmacy she changed to having large stores of these meds at home and taking those and that's why she had not filled. Aware of risk of death from CV disease.

## 2018-12-14 NOTE — ED Triage Notes (Signed)
Pt reports that she had doctors appt today and had lab work done then called and told to go to ED for elevated lab. In chart reads troponin was in 80s. Hx NSTEMI. Denies pains.

## 2018-12-14 NOTE — ED Notes (Signed)
Attempted to call report and was told that the charge RN was unsure who was going to get that patient and that the charge RN left the floor and is unable to take report.

## 2018-12-14 NOTE — Telephone Encounter (Signed)
'  Saa' at [facility] labs calling to report troponin level of 82. Read back to verify.  Unable to reach pt at all numbers provided, including mother's on DPR . Called on call physician Dr. Jerilee Hoh, advised ED. Aware unable to reach pt but [place] would continue to attempt to reach.  1744: Able to reach pt, instructed to go to ED now. Verbalizes understanding. States family will drive.

## 2018-12-14 NOTE — Progress Notes (Signed)
Brief Cardiology Note:  Called by ED re: 57 year old woman with CAD s/p MI 2006 (PTCA to LAD) and NSTEMI 06/2017 with normal cors who presented with a few days of episodic CP. EKG non-ischemic. hsTN drawn at PMD today 82 with 3 hour hsTn 69 (delta 13). Patient CP free during evaluation. Continues to be quite HTNsive to 190s/100s.   Agreed that patient low to moderate pre-test probability for obstructive CAD based upon LHC 1 year prior, but with abnormal troponin in setting of CP would favor treating as type 1 NSTEMI and obtaining TTE to ensure no new WMAs. Once BP under control can consider heparin gtt, particularly if recurrent CP, as long as no contraindications.   Recommend transfer to [facility] when feasible for ongoing ischemic evaluation.   Hayley Starry K. Marletta Lor, MD

## 2018-12-14 NOTE — Progress Notes (Signed)
   Subjective:   Patient ID: Hayley Miller, female    DOB: 12-01-1960, 58 y.o.   MRN: [id number]  HPI The patient is a 58 YO female coming in for concerns about blood pressure. She was seen last week for sinus problems and wanted refills. Her last in person visit was Dec 2019 and BP was severely elevated. She was asked to come in person as it was suspected that her blood pressure could still be elevated. She is having a lot of headaches in the last several months. She states she is taking all her medicines appropriately. She brought bottles of everything she is taking today. She has bottles for hydralazine (states taking TID), metoprolol (states taking BID), losartan/hctz and pravastatin and nexium. She did start taking zyrtec for the sinus and this helped a little. History of NSTEMI from HTN urgency in the past. She did also have an episode of something in her chest about 2 days ago. She was worried so took nitroglycerin and this did help.   Called pharmacy and their records: Hydralazine April 1 month supply Losartan/hctz April 90 Wares supply Metoprolol Nov 2019 3 month supply Amlodipine June 2019 1 month supply  PMH, [facility], social history reviewed and updated  Review of Systems  Constitutional: Negative.   HENT: Positive for sinus pressure.   Eyes: Negative.   Respiratory: Negative for cough, chest tightness and shortness of breath.   Cardiovascular: Positive for chest pain. Negative for palpitations and leg swelling.  Gastrointestinal: Negative for abdominal distention, abdominal pain, constipation, diarrhea, nausea and vomiting.  Musculoskeletal: Negative.   Skin: Negative.   Neurological: Positive for headaches.  Psychiatric/Behavioral: Negative.     Objective:  Physical Exam Constitutional:      Appearance: She is well-developed.  HENT:     Head: Normocephalic and atraumatic.  Neck:     Musculoskeletal: Normal range of motion.  Cardiovascular:     Rate and Rhythm: Normal rate  and regular rhythm.  Pulmonary:     Effort: Pulmonary effort is normal. No respiratory distress.     Breath sounds: Normal breath sounds. No wheezing or rales.  Abdominal:     General: Bowel sounds are normal. There is no distension.     Palpations: Abdomen is soft.     Tenderness: There is no abdominal tenderness. There is no rebound.  Musculoskeletal:        General: Tenderness present.     Comments: Head pain  Skin:    General: Skin is warm and dry.  Neurological:     Mental Status: She is alert and oriented to person, place, and time.     Coordination: Coordination normal.     Vitals:   12/14/18 1535 12/14/18 1558  BP: (!) 180/120 (!) 170/120  Pulse: 78   Temp: 98.3 F (36.8 C)   TempSrc: Oral   SpO2: 98%   Weight: 171 lb (77.6 kg)   Height: 5\' 4"  (1.626 m)    EKG: Rate 70, left axis, intervals normal, st/t wave changes which are chronic and stable, LVH, no significant change compared to Dec 2019  Assessment & Plan:  Visit time 40 minutes: greater than 50% of that time was spent in face to face counseling and coordination of care with the patient: counseled about high risk of CV disease due to uncontrolled HTN and in discussion with her about what we can do to help her with compliance and with BP.

## 2018-12-14 NOTE — ED Notes (Signed)
ED TO INPATIENT HANDOFF REPORT  Name/Age/Gender Hayley Miller 58 y.o. female  Code Status    Code Status Orders  (From admission, onward)         Start     Ordered   12/14/18 2149  Full code  Continuous     12/14/18 2224        Code Status History    Date Active Date Inactive Code Status Order ID Comments User Context   07/19/2017 0236 07/21/2017 1544 Full Code [id number]  Norval Morton, MD ED   05/08/2013 1224 05/09/2013 1744 Full Code [id number]  Olga Millers, MD Inpatient   Advance Care Planning Activity      Home/SNF/Other Home  Chief Complaint Abnormal Labs  Level of Care/Admitting Diagnosis ED Disposition    ED Disposition Condition [place] [facility]: [facility] [[id number]]  Level of Care: Telemetry [5]  Admit to tele based on following criteria: Monitor for Ischemic changes  Covid Evaluation: Asymptomatic Screening Protocol (No Symptoms)  Diagnosis: Hypertensive urgency [263335]  Admitting Physician: Etta Quill [[id number]]  Attending Physician: Etta Quill [[id number]]  PT Class (Do Not Modify): Observation [104]  PT Acc Code (Do Not Modify): Observation [10022]       Medical History Past Medical History:  Diagnosis Date  . Anemia   . Anxiety   . CAD (coronary artery disease)    a. s/p AL MI in 2006 tx with PTCA to LAD;  b. LHC (10/2009): lum irregs in LAD, o/w no CAD, EF 55-60%;  c. Myoview (07/2011):  no ischemia, EF 63%;  d. Echo (11/2011):  mild LVH, EF 60-65%, Gr 1 DD;  e.  Lexiscan Myoview (04/2013):  Inferior bowel atten artifact with inferoapical defect; no ischemia; EF 58% (low risk)  . Cameron lesion, acute   . Chronic lower back pain   . Chronic pain syndrome   . Daily headache   . Depression   . Erosive esophagitis   . Fibromyalgia   . GERD (gastroesophageal reflux disease)   . Hemorrhage    upper gastrointestional. GI bleed 10/09 due to NSAID  . Hiatal hernia   . HLD (hyperlipidemia)   . HTN  (hypertension)   . Myocardial infarction ([place]) 2006 X 2  . Rhinitis   . Seasonal allergies     Allergies Allergies  Allergen Reactions  . Clonidine Hydrochloride Other (See Comments)    REACTION: FATIGUE  . Codeine Other (See Comments)    Unknown reaction (patient reports taking cough syrup with codeine)  . Ibuprofen Other (See Comments)    GI bleed  . Lovastatin Other (See Comments)    Join pain    IV Location/Drains/Wounds Patient Lines/Drains/Airways Status   Active Line/Drains/Airways    Name:   Placement date:   Placement time:   Site:   Days:   Peripheral IV 12/14/18 Left Antecubital   12/14/18    2036    Antecubital   less than 1          Labs/Imaging Results for orders placed or performed during the hospital encounter of 12/14/18 (from the past 48 hour(s))  I-Stat beta hCG blood, ED     Status: None   Collection Time: 12/14/18  6:48 PM  Result Value Ref Range   I-stat hCG, quantitative <5.0 <5 mIU/mL   Comment 3            Comment:   GEST. AGE      CONC.  (  mIU/mL)   <=1 WEEK        5 - 50     2 WEEKS       50 - 500     3 WEEKS       100 - 10,000     4 WEEKS     1,000 - 30,000        FEMALE AND NON-PREGNANT FEMALE:     LESS THAN 5 mIU/mL   Troponin I (High Sensitivity)     Status: Abnormal   Collection Time: 12/14/18  7:00 PM  Result Value Ref Range   Troponin I (High Sensitivity) 69 (H) <18 ng/L    Comment: (NOTE) Elevated high sensitivity troponin I (hsTnI) values and significant  changes across serial measurements may suggest ACS but many other  chronic and acute conditions are known to elevate hsTnI results.  Refer to the "Links" section for chest pain algorithms and additional  guidance. Performed at [facility], [place] [address], [place], [place] [address]   Troponin I (High Sensitivity)     Status: Abnormal   Collection Time: 12/14/18  8:24 PM  Result Value Ref Range   Troponin I (High Sensitivity) 70 (H) <18 ng/L    Comment:  (NOTE) Elevated high sensitivity troponin I (hsTnI) values and significant  changes across serial measurements may suggest ACS but many other  chronic and acute conditions are known to elevate hsTnI results.  Refer to the "Links" section for chest pain algorithms and additional  guidance. Performed at [facility], [place] [address], [place], [place] [address]   APTT     Status: None   Collection Time: 12/14/18 10:44 PM  Result Value Ref Range   aPTT 31 24 - 36 seconds    Comment: Performed at [facility], [place] [address] [place], [place] [address]  Protime-INR     Status: None   Collection Time: 12/14/18 10:44 PM  Result Value Ref Range   Prothrombin Time 13.2 11.4 - 15.2 seconds   INR 1.0 0.8 - 1.2    Comment: (NOTE) INR goal varies based on device and disease states. Performed at [facility], [place] [address], [place], [place] [address]    Dg Chest 2 View  Result Date: 12/14/2018 CLINICAL DATA:  Pt reports that she had doctors appt today and had lab work done then called and told to go to ED for elevated lab. In chart reads troponin was in 80s. Hx NSTEMI. Denies pains. EXAM: CHEST - 2 VIEW COMPARISON:  09/22/2017 FINDINGS: Cardiac silhouette is normal in size. No mediastinal or hilar masses. No evidence of adenopathy. Lungs are hyperexpanded but clear. No pleural effusion or pneumothorax. Skeletal structures are intact. IMPRESSION: No active cardiopulmonary disease. Electronically Signed   By: Lajean Manes M.D.   On: 12/14/2018 19:08    Pending Labs Unresulted Labs (From admission, onward)    Start     Ordered   12/15/18 0093  Basic metabolic panel  Tomorrow morning,   R     12/14/18 2224   12/14/18 2311  SARS CORONAVIRUS 2 (TAT 6-24 HRS) Nasopharyngeal Nasopharyngeal Swab  (Asymptomatic/Tier 2)  Once,   STAT    Question Answer Comment  Is this test for diagnosis or screening Screening   Symptomatic for COVID-19 as  defined by CDC No   Hospitalized for COVID-19 No   Admitted to ICU for COVID-19 No   Previously tested for COVID-19 No   Resident  in a congregate (group) care setting No   Employed in healthcare setting No   Pregnant No      12/14/18 2311   12/14/18 2146  HIV antibody (Routine Testing)  Once,   STAT     12/14/18 2224          Vitals/Pain Today's Vitals   12/14/18 2230 12/14/18 2253 12/14/18 2300 12/14/18 2324  BP: (!) 203/143  (!) 183/124   Pulse: 91 87 85   Resp: 18  18   Temp:      TempSrc:      SpO2: 98%  95%   PainSc:    0-No pain    Isolation Precautions No active isolations  Medications Medications  hydrALAZINE (APRESOLINE) tablet 25 mg (25 mg Oral Given 12/14/18 2253)  losartan-hydrochlorothiazide (HYZAAR) 100-25 MG per tablet 1 tablet (has no administration in time range)  metoprolol tartrate (LOPRESSOR) tablet 25 mg (25 mg Oral Given 12/14/18 2253)  tetrahydrozoline 0.05 % ophthalmic solution 1 drop (has no administration in time range)  pravastatin (PRAVACHOL) tablet 40 mg (has no administration in time range)  pantoprazole (PROTONIX) EC tablet 80 mg (has no administration in time range)  fluticasone (FLONASE) 50 MCG/ACT nasal spray 2 spray (has no administration in time range)  amLODipine (NORVASC) tablet 10 mg (10 mg Oral Given 12/14/18 2254)  acetaminophen (TYLENOL) tablet 650 mg (has no administration in time range)  labetalol (NORMODYNE) injection 10-20 mg (has no administration in time range)  aspirin EC tablet 81 mg (has no administration in time range)  multivitamin with minerals tablet 1 tablet (has no administration in time range)  ondansetron (ZOFRAN) tablet 4 mg (has no administration in time range)    Or  ondansetron (ZOFRAN) injection 4 mg (has no administration in time range)  heparin bolus via infusion 3,000 Units (has no administration in time range)  heparin ADULT infusion 100 units/mL (25000 units/287mL sodium chloride 0.45%) (has no  administration in time range)  aspirin chewable tablet 324 mg (324 mg Oral Given 12/14/18 1952)    Mobility walks

## 2018-12-14 NOTE — ED Notes (Signed)
Pt ambulated to restroom. 

## 2018-12-14 NOTE — Progress Notes (Signed)
ANTICOAGULATION CONSULT NOTE - Initial Consult  Pharmacy Consult for IV heparin Indication: chest pain/ACS  Allergies  Allergen Reactions  . Clonidine Hydrochloride Other (See Comments)    REACTION: FATIGUE  . Codeine Other (See Comments)    Unknown reaction (patient reports taking cough syrup with codeine)  . Ibuprofen Other (See Comments)    GI bleed  . Lovastatin Other (See Comments)    Join pain    Patient Measurements:   Heparin Dosing Weight: 62 kg  Vital Signs: Temp: 98.1 F (36.7 C) (09/17 1819) Temp Source: Oral (09/17 1819) BP: 203/143 (09/17 2230) Pulse Rate: 87 (09/17 2253)  Labs: Recent Labs    12/14/18 1620 12/14/18 1900 12/14/18 2024  HGB 14.9  --   --   HCT 44.5  --   --   PLT 348.0  --   --   CREATININE 1.36*  --   --   TROPONINIHS  --  69* 70*    Estimated Creatinine Clearance: 46.6 mL/min (A) (by C-G formula based on SCr of 1.36 mg/dL (H)).   Medical History: Past Medical History:  Diagnosis Date  . Anemia   . Anxiety   . CAD (coronary artery disease)    a. s/p AL MI in 2006 tx with PTCA to LAD;  b. LHC (10/2009): lum irregs in LAD, o/w no CAD, EF 55-60%;  c. Myoview (07/2011):  no ischemia, EF 63%;  d. Echo (11/2011):  mild LVH, EF 60-65%, Gr 1 DD;  e.  Lexiscan Myoview (04/2013):  Inferior bowel atten artifact with inferoapical defect; no ischemia; EF 58% (low risk)  . Cameron lesion, acute   . Chronic lower back pain   . Chronic pain syndrome   . Daily headache   . Depression   . Erosive esophagitis   . Fibromyalgia   . GERD (gastroesophageal reflux disease)   . Hemorrhage    upper gastrointestional. GI bleed 10/09 due to NSAID  . Hiatal hernia   . HLD (hyperlipidemia)   . HTN (hypertension)   . Myocardial infarction ([place]) 2006 X 2  . Rhinitis   . Seasonal allergies     Medications:  Scheduled:  . amLODipine  10 mg Oral Daily  . [START ON 12/15/2018] aspirin EC  81 mg Oral Daily  . hydrALAZINE  25 mg Oral TID  . [START ON  12/15/2018] losartan-hydrochlorothiazide  1 tablet Oral Daily  . metoprolol tartrate  25 mg Oral BID  . [START ON 12/15/2018] multivitamin with minerals  1 tablet Oral Daily  . [START ON 12/15/2018] pantoprazole  80 mg Oral Q1200  . [START ON 12/15/2018] pravastatin  40 mg Oral Daily   Infusions:    Assessment: 84 yoF with CAD s/p MI 2006 (PTCA to LAD) and NSTEMI 4/19 now with CP.  Baseline: H/H = 14.9/44.5, plts = 348  Goal of Therapy:  Heparin level 0.3-0.7 units/ml Monitor platelets by anticoagulation protocol: Yes   Plan:  Baseline aptt and PT/INR stat Heparin 3000 unit IV bolus x1 now Start drip at 750 units/hr Daily CBC/HL Check 1st HL in 6 hours  Dorrene German 12/14/2018,11:05 PM

## 2018-12-14 NOTE — Patient Instructions (Addendum)
Your EKG is not changed from last time but does show signs of damage from the blood pressure. We need to get the blood pressure under control.   We have sent in refill of the blood pressure medicines to take.   Amlodipine 1 pill daily  Metoprolol 1 pill twice a Wis[place]  Hydralazine 1 pill 3 times a [place]  Losartan/Hctz 1 pill daily  Come back in 2 weeks so we can check the blood pressure.

## 2018-12-15 ENCOUNTER — Inpatient Hospital Stay (HOSPITAL_COMMUNITY): Payer: Medicare Other

## 2018-12-15 DIAGNOSIS — R7989 Other specified abnormal findings of blood chemistry: Secondary | ICD-10-CM

## 2018-12-15 DIAGNOSIS — I160 Hypertensive urgency: Principal | ICD-10-CM

## 2018-12-15 DIAGNOSIS — Z9861 Coronary angioplasty status: Secondary | ICD-10-CM | POA: Diagnosis not present

## 2018-12-15 DIAGNOSIS — J302 Other seasonal allergic rhinitis: Secondary | ICD-10-CM | POA: Diagnosis present

## 2018-12-15 DIAGNOSIS — R0789 Other chest pain: Secondary | ICD-10-CM

## 2018-12-15 DIAGNOSIS — I252 Old myocardial infarction: Secondary | ICD-10-CM | POA: Diagnosis not present

## 2018-12-15 DIAGNOSIS — Z7982 Long term (current) use of aspirin: Secondary | ICD-10-CM | POA: Diagnosis not present

## 2018-12-15 DIAGNOSIS — K219 Gastro-esophageal reflux disease without esophagitis: Secondary | ICD-10-CM | POA: Diagnosis present

## 2018-12-15 DIAGNOSIS — Z20828 Contact with and (suspected) exposure to other viral communicable diseases: Secondary | ICD-10-CM | POA: Diagnosis present

## 2018-12-15 DIAGNOSIS — R51 Headache: Secondary | ICD-10-CM | POA: Diagnosis not present

## 2018-12-15 DIAGNOSIS — I25110 Atherosclerotic heart disease of native coronary artery with unstable angina pectoris: Secondary | ICD-10-CM | POA: Diagnosis not present

## 2018-12-15 DIAGNOSIS — E7800 Pure hypercholesterolemia, unspecified: Secondary | ICD-10-CM | POA: Diagnosis not present

## 2018-12-15 DIAGNOSIS — I340 Nonrheumatic mitral (valve) insufficiency: Secondary | ICD-10-CM | POA: Diagnosis not present

## 2018-12-15 DIAGNOSIS — I10 Essential (primary) hypertension: Secondary | ICD-10-CM | POA: Diagnosis not present

## 2018-12-15 DIAGNOSIS — Z87891 Personal history of nicotine dependence: Secondary | ICD-10-CM | POA: Diagnosis not present

## 2018-12-15 DIAGNOSIS — E785 Hyperlipidemia, unspecified: Secondary | ICD-10-CM | POA: Diagnosis present

## 2018-12-15 DIAGNOSIS — I2510 Atherosclerotic heart disease of native coronary artery without angina pectoris: Secondary | ICD-10-CM | POA: Diagnosis present

## 2018-12-15 DIAGNOSIS — I701 Atherosclerosis of renal artery: Secondary | ICD-10-CM | POA: Diagnosis not present

## 2018-12-15 DIAGNOSIS — Z8249 Family history of ischemic heart disease and other diseases of the circulatory system: Secondary | ICD-10-CM | POA: Diagnosis not present

## 2018-12-15 DIAGNOSIS — E876 Hypokalemia: Secondary | ICD-10-CM | POA: Diagnosis present

## 2018-12-15 LAB — ECHOCARDIOGRAM COMPLETE
Height: 64 in
Weight: 2720 oz

## 2018-12-15 LAB — BASIC METABOLIC PANEL
Anion gap: 10 (ref 5–15)
BUN: 20 mg/dL (ref 6–20)
CO2: 28 mmol/L (ref 22–32)
Calcium: 10.2 mg/dL (ref 8.9–10.3)
Chloride: 104 mmol/L (ref 98–111)
Creatinine, Ser: 1.01 mg/dL — ABNORMAL HIGH (ref 0.44–1.00)
GFR calc Af Amer: >60 mL/min (ref >60)
GFR calc non Af Amer: >60 mL/min (ref >60)
Glucose, Bld: 103 mg/dL — ABNORMAL HIGH (ref 70–99)
Potassium: 3.2 mmol/L — ABNORMAL LOW (ref 3.5–5.1)
Sodium: 142 mmol/L (ref 135–145)

## 2018-12-15 LAB — CBC
HCT: 46.3 % — ABNORMAL HIGH (ref 36.0–46.0)
Hemoglobin: 15 g/dL (ref 12.0–15.0)
MCH: 31.3 pg (ref 26.0–34.0)
MCHC: 32.4 g/dL (ref 30.0–36.0)
MCV: 96.5 fL (ref 80.0–100.0)
Platelets: 317 10*3/uL (ref 150–400)
RBC: 4.8 MIL/uL (ref 3.87–5.11)
RDW: 13.2 % (ref 11.5–15.5)
WBC: 9.5 10*3/uL (ref 4.0–10.5)
nRBC: 0 % (ref 0.0–0.2)

## 2018-12-15 LAB — HEPARIN LEVEL (UNFRACTIONATED)
Heparin Unfractionated: 0.24 IU/mL — ABNORMAL LOW (ref 0.30–0.70)
Heparin Unfractionated: 0.48 IU/mL (ref 0.30–0.70)

## 2018-12-15 LAB — SARS CORONAVIRUS 2 (TAT 6-24 HRS): SARS Coronavirus 2: NEGATIVE

## 2018-12-15 LAB — HIV ANTIBODY (ROUTINE TESTING W REFLEX): HIV Screen 4th Generation wRfx: NONREACTIVE

## 2018-12-15 MED ORDER — LABETALOL HCL 5 MG/ML IV SOLN
20.0000 mg | INTRAVENOUS | Status: DC | PRN
  Administered 2018-12-15 – 2018-12-16 (×3): 20 mg via INTRAVENOUS
  Filled 2018-12-15 (×4): qty 4

## 2018-12-15 MED ORDER — CARVEDILOL 25 MG PO TABS
25.0000 mg | ORAL_TABLET | Freq: Two times a day (BID) | ORAL | Status: DC
  Administered 2018-12-15 – 2018-12-17 (×4): 25 mg via ORAL
  Filled 2018-12-15 (×5): qty 1

## 2018-12-15 MED ORDER — HEPARIN (PORCINE) 25000 UT/250ML-% IV SOLN
900.0000 [IU]/h | INTRAVENOUS | Status: DC
  Administered 2018-12-15: 900 [IU]/h via INTRAVENOUS
  Filled 2018-12-15: qty 250

## 2018-12-15 MED ORDER — HEPARIN BOLUS VIA INFUSION
4000.0000 [IU] | Freq: Once | INTRAVENOUS | Status: AC
  Administered 2018-12-15: 4000 [IU] via INTRAVENOUS
  Filled 2018-12-15: qty 4000

## 2018-12-15 MED ORDER — LORATADINE 10 MG PO TABS
10.0000 mg | ORAL_TABLET | Freq: Every day | ORAL | Status: DC
  Administered 2018-12-15 – 2018-12-17 (×3): 10 mg via ORAL
  Filled 2018-12-15 (×3): qty 1

## 2018-12-15 MED ORDER — HEPARIN (PORCINE) 25000 UT/250ML-% IV SOLN: 900.0000 [IU]/h | INTRAVENOUS | Status: DC

## 2018-12-15 MED ORDER — NITROGLYCERIN 2 % TD OINT
0.5000 [in_us] | TOPICAL_OINTMENT | Freq: Four times a day (QID) | TRANSDERMAL | Status: DC
  Administered 2018-12-15: 0.5 [in_us] via TOPICAL
  Filled 2018-12-15: qty 30

## 2018-12-15 MED ORDER — SPIRONOLACTONE 12.5 MG HALF TABLET
12.5000 mg | ORAL_TABLET | Freq: Every day | ORAL | Status: DC
  Administered 2018-12-15: 12.5 mg via ORAL
  Filled 2018-12-15 (×2): qty 1

## 2018-12-15 MED ORDER — HYDRALAZINE HCL 50 MG PO TABS
100.0000 mg | ORAL_TABLET | Freq: Three times a day (TID) | ORAL | Status: DC
  Administered 2018-12-15 – 2018-12-17 (×7): 100 mg via ORAL
  Filled 2018-12-15 (×7): qty 2

## 2018-12-15 NOTE — Progress Notes (Signed)
Echocardiogram 2D Echocardiogram has been performed.  Oneal Deputy Maksim Peregoy 12/15/2018, 3:20 PM

## 2018-12-15 NOTE — Care Management Obs Status (Signed)
[facility] NOTIFICATION   Patient Details  Name: Hayley Miller MRN: [id number] Date of Birth: [birth year]   Medicare Observation Status Notification Given:  Yes    MahabirJuliann Pulse, RN 12/15/2018, 11:16 AM

## 2018-12-15 NOTE — Telephone Encounter (Signed)
fyi

## 2018-12-15 NOTE — Progress Notes (Addendum)
PROGRESS NOTE  Hayley Miller QQP:[id number] DOB: 1961-01-09 DOA: 12/14/2018 PCP: Hoyt Koch, MD   LOS: 0 days   Brief narrative: Hayley Miller is a 58 y.o. female with medical history significant of HTN, CAD, MI in 2006 PTCA to LAD, though most recent LHC in 06/2017 showed normal coronaries without any mention of disease.She presented to the ED  for concerns of BP elevation.  She was seen at PCPs office officefor HTN.  She notes recurrent headaches over the past few days and intermittent episodes of CP, most recently 2 days ago.  PCP refilled meds and was going to start amlodipine 10mg  additionally. BP was 180/120 at PCPs office and trop of 82.  Patient was sent in to ED.  ED Course: CP free, repeat trops were 69 and 70.  BP 190s/120s, and running as high as 200/140.  Assessment/Plan:  Principal Problem:   Hypertensive urgency Active Problems:   Severe uncontrolled hypertension   CAD (coronary artery disease)  Accelerated hypertension.  Difficult to control.  Patient is on multiple medications.  I spoke with cardiology dr Marisue Ivan regarding consultation.  Patient has been seen by cardiology yesterday.  Cardiology recommended discontinuation of metoprolol and starting Coreg 25 mg twice a [place], changing the dose of hydralazine 25 mg to 100 mg 3 times daily.  Will closely monitor.  PRN labetalol.  Intermittent chest pain.  Mildly elevated troponin.  History of coronary artery disease status post PTCA.  Recent left heart catheter on April 2019 showed normal coronary anatomy.  Patient was seen by cardiology yesterday.  Patient does have history of coronary artery disease.  Spoke with cardiology.  Cardiology will review the patient today.  Continue heparin drip for now.  Continue beta-blockers.  Continue aspirin, statins.   VTE Prophylaxis: On heparin drip  Code Status: Full code  Family Communication: None  Disposition Plan: Home likely in 1 to 2 days.  Will follow cardiology  recommendation.  We will continue to adjust her medications for accelerated hypertension.  We will see the patient discharged to inpatient at this time due to need for a heparin drip, possible cardiac catheterization cardiology evaluation and further evaluation and optimizaton of blood pressure.   Consultants:  Cardiology  Procedures:  None  Antibiotics: Anti-infectives (From admission, onward)   None      Subjective: She complains of a headache.  Denies chest pain at the time of my evaluation.  Denies shortness of breath.  No nausea vomiting or dizziness.  Objective: Vitals:   12/15/18 1000 12/15/18 1024  BP: (!) 188/111 (!) 182/121  Pulse: 78 78  Resp: 15   Temp:    SpO2:      Intake/Output Summary (Last 24 hours) at 12/15/2018 1138 Last data filed at 12/15/2018 0436 Gross per 24 hour  Intake 62.36 ml  Output -  Net 62.36 ml   Filed Weights   12/14/18 2337  Weight: 77.1 kg   Body mass index is 29.18 kg/m.   Physical Exam: GENERAL: Patient is alert awake and oriented. Not in obvious distress. HENT: No scleral pallor or icterus. Pupils equally reactive to light. Oral mucosa is moist NECK: is supple, no palpable thyroid enlargement. CHEST: Clear to auscultation. No crackles or wheezes. Non tender on palpation. Diminished breath sounds bilaterally. CVS: S1 and S2 heard, no murmur. Regular rate and rhythm. No pericardial rub. ABDOMEN: Soft, non-tender, bowel sounds are present. No palpable hepato-splenomegaly. EXTREMITIES: No edema. CNS: Cranial nerves are intact. No focal motor or  sensory deficits. SKIN: warm and dry without rashes.  Data Review: I have personally reviewed the following laboratory data and studies,  CBC: Recent Labs  Lab 12/14/18 1620 12/15/18 0752  WBC 8.8 9.5  HGB 14.9 15.0  HCT 44.5 46.3*  MCV 93.3 96.5  PLT 348.0 962   Basic Metabolic Panel: Recent Labs  Lab 12/14/18 1620 12/15/18 0752  NA 141 142  K 4.1 3.2*  CL 104 104   CO2 30 28  GLUCOSE 99 103*  BUN 20 20  CREATININE 1.36* 1.01*  CALCIUM 10.8* 10.2   Liver Function Tests: Recent Labs  Lab 12/14/18 1620  AST 18  ALT 20  ALKPHOS 74  BILITOT 0.4  PROT 7.7  ALBUMIN 4.4   No results for input(s): LIPASE, AMYLASE in the last 168 hours. No results for input(s): AMMONIA in the last 168 hours. Cardiac Enzymes: No results for input(s): CKTOTAL, CKMB, CKMBINDEX, TROPONINI in the last 168 hours. BNP (last 3 results) No results for input(s): BNP in the last 8760 hours.  ProBNP (last 3 results) Recent Labs    12/14/18 1620  PROBNP 61.0    CBG: No results for input(s): GLUCAP in the last 168 hours. Recent Results (from the past 240 hour(s))  SARS CORONAVIRUS 2 (TAT 6-24 HRS) Nasopharyngeal Nasopharyngeal Swab     Status: None   Collection Time: 12/14/18 11:11 PM   Specimen: Nasopharyngeal Swab  Result Value Ref Range Status   SARS Coronavirus 2 NEGATIVE NEGATIVE Final    Comment: (NOTE) SARS-CoV-2 target nucleic acids are NOT DETECTED. The SARS-CoV-2 RNA is generally detectable in upper and lower respiratory specimens during the acute phase of infection. Negative results do not preclude SARS-CoV-2 infection, do not rule out co-infections with other pathogens, and should not be used as the sole basis for treatment or other patient management decisions. Negative results must be combined with clinical observations, patient history, and epidemiological information. The expected result is Negative. Fact Sheet for Patients: SugarRoll.be Fact Sheet for Healthcare Providers: https://www.woods-mathews.com/ This test is not yet approved or cleared by the [place] FDA and  has been authorized for detection and/or diagnosis of SARS-CoV-2 by FDA under an Emergency Use Authorization (EUA). This EUA will remain  in effect (meaning this test can be used) for the duration of the COVID-19 declaration under  Section 56 4(b)(1) of the Act, 21 U.S.C. section 360bbb-3(b)(1), unless the authorization is terminated or revoked sooner. Performed at [facility], [employer] [address], [place], [place] [address]      Studies: Dg Chest 2 View  Result Date: 12/14/2018 CLINICAL DATA:  Pt reports that she had doctors appt today and had lab work done then called and told to go to ED for elevated lab. In chart reads troponin was in 80s. Hx NSTEMI. Denies pains. EXAM: CHEST - 2 VIEW COMPARISON:  09/22/2017 FINDINGS: Cardiac silhouette is normal in size. No mediastinal or hilar masses. No evidence of adenopathy. Lungs are hyperexpanded but clear. No pleural effusion or pneumothorax. Skeletal structures are intact. IMPRESSION: No active cardiopulmonary disease. Electronically Signed   By: Lajean Manes M.D.   On: 12/14/2018 19:08    Scheduled Meds: . amLODipine  10 mg Oral Daily  . aspirin EC  81 mg Oral Daily  . carvedilol  25 mg Oral BID WC  . hydrALAZINE  100 mg Oral TID  . losartan  100 mg Oral Daily   And  . hydrochlorothiazide  25 mg Oral Daily  . multivitamin  with minerals  1 tablet Oral Daily  . nitroGLYCERIN  0.5 inch Topical Q6H  . pantoprazole  80 mg Oral Q1200  . pravastatin  40 mg Oral q1800    Continuous Infusions: . heparin 900 Units/hr (12/15/18 0761)     Flora Lipps, MD  [facility] 12/15/2018

## 2018-12-15 NOTE — Progress Notes (Addendum)
PROGRESS NOTE    Hayley Miller  JME:[id number]  DOB: 08/31/1960  DOA: 12/14/2018 PCP: Hoyt Koch, MD  Brief Narrative:  Hayley Miller a 58 y.o.femalewith medical history significant ofHTN, CAD, MI in 2006 requiring PTCA to LAD, most recent LHC in 06/2017 for chest pain complaints showed normal coronaries without any mention of disease, h/o NSAID induced UGIB IN 2009, presented to the ED  for concerns elevated BP, recurrent headaches over the past few days and intermittent episodes of CP, most recently 2 days PTA. PCP refilled meds and was going to start amlodipine 10mg  in addition to other home meds. BP was 180/120 at PCPs office and HS Troponin of 82.Patient was sent in to ED for further evaluation. ED Course:CP free, repeat trops were 69 and 70. BP 190s/120s, and running as high as 200/140.Re[facility] at [facility] was 69.  EKG shows LAFB, 84 bpm. CBC unremarkable. BMP showed mild renal insuffiencey, w/ SCr at 1.36. She was given IV labetalol and started on a nitro patch along with her home meds and BP improved. Patient admitted to Hospitalist service with cardiology evaluation.    Subjective:  Patient sitting comfortably on the bed, mother bedside.  Patient states headache improved and now feels more like sinusitis related right maxillary/facial pain.  No chest pains today.  Blood pressure improving but still elevated  Objective: Vitals:   12/15/18 2357 12/16/18 0107 12/16/18 0505 12/16/18 0824  BP: (!) 153/116 (!) 160/103 (!) 161/97 (!) 152/104  Pulse:  70 71 87  Resp:   16   Temp:   98.3 F (36.8 C)   TempSrc:   Oral   SpO2:   94%   Weight:      Height:        Intake/Output Summary (Last 24 hours) at 12/16/2018 0830 Last data filed at 12/16/2018 0415 Gross per 24 hour  Intake 168.18 ml  Output --  Net 168.18 ml   Filed Weights   12/14/18 2337  Weight: 77.1 kg    Physical Examination:  General exam: Appears calm and comfortable  Respiratory system:  Clear to auscultation. Respiratory effort normal. Cardiovascular system: S1 & S2 heard, RRR. No JVD, murmurs, rubs, gallops or clicks. No pedal edema. Gastrointestinal system: Abdomen is nondistended, soft and nontender. No organomegaly or masses felt. Normal bowel sounds heard. Central nervous system: Alert and oriented. No focal neurological deficits. Extremities: Symmetric 5 x 5 power. Skin: No rashes, lesions or ulcers Psychiatry: Judgement and insight appear normal. Mood & affect appropriate.     Data Reviewed: I have personally reviewed following labs and imaging studies  CBC: Recent Labs  Lab 12/14/18 1620 12/15/18 0752 12/16/18 0358  WBC 8.8 9.5 7.4  HGB 14.9 15.0 14.3  HCT 44.5 46.3* 44.8  MCV 93.3 96.5 95.7  PLT 348.0 317 229   Basic Metabolic Panel: Recent Labs  Lab 12/14/18 1620 12/15/18 0752 12/16/18 0358  NA 141 142 139  K 4.1 3.2* 3.2*  CL 104 104 101  CO2 30 28 25   GLUCOSE 99 103* 100*  BUN 20 20 24*  CREATININE 1.36* 1.01* 1.14*  CALCIUM 10.8* 10.2 10.5*  MG  --   --  2.0   GFR: Estimated Creatinine Clearance: 55.4 mL/min (A) (by C-G formula based on SCr of 1.14 mg/dL (H)). Liver Function Tests: Recent Labs  Lab 12/14/18 1620  AST 18  ALT 20  ALKPHOS 74  BILITOT 0.4  PROT 7.7  ALBUMIN 4.4   No results  for input(s): LIPASE, AMYLASE in the last 168 hours. No results for input(s): AMMONIA in the last 168 hours. Coagulation Profile: Recent Labs  Lab 12/14/18 2244  INR 1.0   Cardiac Enzymes: No results for input(s): CKTOTAL, CKMB, CKMBINDEX, TROPONINI in the last 168 hours. BNP (last 3 results) Recent Labs    12/14/18 1620  PROBNP 61.0   HbA1C: Recent Labs    12/14/18 1620  HGBA1C 5.4   CBG: No results for input(s): GLUCAP in the last 168 hours. Lipid Profile: Recent Labs    12/14/18 1620  CHOL 181  HDL 40.10  LDLCALC 101*  TRIG 196.0*  CHOLHDL 5   Thyroid Function Tests: Recent Labs    12/14/18 1620  TSH 2.04    Anemia Panel: No results for input(s): VITAMINB12, FOLATE, FERRITIN, TIBC, IRON, RETICCTPCT in the last 72 hours. Sepsis Labs: No results for input(s): PROCALCITON, LATICACIDVEN in the last 168 hours.  Recent Results (from the past 240 hour(s))  SARS CORONAVIRUS 2 (TAT 6-24 HRS) Nasopharyngeal Nasopharyngeal Swab     Status: None   Collection Time: 12/14/18 11:11 PM   Specimen: Nasopharyngeal Swab  Result Value Ref Range Status   SARS Coronavirus 2 NEGATIVE NEGATIVE Final    Comment: (NOTE) SARS-CoV-2 target nucleic acids are NOT DETECTED. The SARS-CoV-2 RNA is generally detectable in upper and lower respiratory specimens during the acute phase of infection. Negative results do not preclude SARS-CoV-2 infection, do not rule out co-infections with other pathogens, and should not be used as the sole basis for treatment or other patient management decisions. Negative results must be combined with clinical observations, patient history, and epidemiological information. The expected result is Negative. Fact Sheet for Patients: SugarRoll.be Fact Sheet for Healthcare Providers: https://www.woods-mathews.com/ This test is not yet approved or cleared by the [place] FDA and  has been authorized for detection and/or diagnosis of SARS-CoV-2 by FDA under an Emergency Use Authorization (EUA). This EUA will remain  in effect (meaning this test can be used) for the duration of the COVID-19 declaration under Section 56 4(b)(1) of the Act, 21 U.S.C. section 360bbb-3(b)(1), unless the authorization is terminated or revoked sooner. Performed at [facility], [place] [address], [place], [place] [address]       Radiology Studies: Dg Chest 2 View  Result Date: 12/14/2018 CLINICAL DATA:  Pt reports that she had doctors appt today and had lab work done then called and told to go to ED for elevated lab. In chart reads troponin was in 80s. Hx  NSTEMI. Denies pains. EXAM: CHEST - 2 VIEW COMPARISON:  09/22/2017 FINDINGS: Cardiac silhouette is normal in size. No mediastinal or hilar masses. No evidence of adenopathy. Lungs are hyperexpanded but clear. No pleural effusion or pneumothorax. Skeletal structures are intact. IMPRESSION: No active cardiopulmonary disease. Electronically Signed   By: Lajean Manes M.D.   On: 12/14/2018 19:08        Scheduled Meds:  amLODipine  10 mg Oral Daily   aspirin EC  81 mg Oral Daily   carvedilol  25 mg Oral BID WC   hydrALAZINE  100 mg Oral TID   losartan  100 mg Oral Daily   And   hydrochlorothiazide  25 mg Oral Daily   loratadine  10 mg Oral Daily   multivitamin with minerals  1 tablet Oral Daily   pantoprazole  80 mg Oral Q1200   potassium chloride  40 mEq Oral Once   pravastatin  40 mg Oral q1800  spironolactone  25 mg Oral Daily   Continuous Infusions:  heparin 900 Units/hr (12/16/18 0415)    Assessment & Plan:   1. Accelerated HTN/ atypical Chest pain: Her CP in 2019 was felt likely secondary to poorly controlled HTN. Her antihypertensives were increased and amlodipine was also added at that time. Dr. Harrington Challenger recommended renal artery dopplers to r/o RAS but this was never completed.  Her, F/u troponin at 70. Her medications were adjusted including transition to Coreg from metoprolol and increasing Hydralazine dose. SBP now improved to 563S, diastolic still high fluctuating in the 95-100s range. Nitro patch recently removed due to headaches. No current CP. Cardiology feels chest pain is atypical and her presenting symptoms likely related to accelerated HTN. The have resumed Amlodipine and added Spironolactone 12.5 mg daily.  Cardiology recommends RAS work-up if the combination of these therapies with HCTZ, carvedilol, and losartan do not control the blood pressure.They feel minimal elevation in troponin is related to demand and that patient does not require an ischemic work-up if  no recurring episodes of chest pain. D/C heparin infusion and obtain renal Dopplers to rule out RAS.  Noted that cardiology has increased spironolactone dosage to 25 mg today.   2. Headaches : related to chronic sinusitis vs accelearted HTN vs medication induced. BP improving. Nitro patch discontinued. Watch for worsening symptoms on Norvasc which could contribute as well. Continue Loratidine and other chronic sinus meds upon discharge, f/u PCP.  Will add Flonase spray while here.  Improved sinus related pain might improve blood pressure as well.      DVT prophylaxis: Being on heparin infusion, discontinuing today.  Encourage ambulation  Code Status: Full code Family / Patient Communication: Discussed with patient and mother bedside Disposition Plan: Home when blood pressure improved and medically cleared.     LOS: 1 [place]    Time spent: 25 minutes    Guilford Shi, MD [facility] Pager [phone]  If 7PM-7AM, please contact night-coverage [email or web address] Password [email or web address] 12/16/2018, 8:30 AM

## 2018-12-15 NOTE — Consult Note (Addendum)
The patient has been seen in conjunction with Lyda Jester, PA-C. All aspects of care have been considered and discussed. The patient has been personally interviewed, examined, and all clinical data has been reviewed.   Agree with the contents of this note.  Headache is similar to sinus related issues that she has had in the past.  Presentation is related to accelerated hypertension with severe blood pressure elevation.  Agree with reinstituting amlodipine and we will further add Spironolactone 12.5 mg daily.  With the combination of these therapies along with HCTZ, carvedilol, and losartan do not control the blood pressure a search for renal artery stenosis would then be appropriate.  The minimal elevation in troponin is related to demand and does not require an ischemic work-up.  The chest pain was atypical and if no recurring episodes nothing further is required.  Kidney function and potassium need to be followed closely.     Cardiology Consultation:   Patient ID: Hayley Miller MRN: [id number]; DOB: 1961-03-15  Admit date: 12/14/2018 Date of Consult: 12/15/2018  Primary Care Provider: Hoyt Koch, MD Primary Cardiologist: Dorris Carnes, MD  Primary Electrophysiologist:  None    Patient Profile:   Hayley Miller is a 57 y.o. female with a hx of poorly controlled HTN, prior anteroapical infarct w/ occluded distal LAD in 2006 that was treated w/ POBA with repeat LHC in 2019 showing normal coronaries, HLD, GERD and h/o GIB due to NSAID use in 2009, who is being seen today for the evaluation of chest pain, at the request of Dr. Louanne Belton, Internal Medicine.   History of Present Illness:   Hayley Miller is a 58 y/o AAF with a h/o prior anteroapical infarct w/ occluded distal LAD in 2006 that was treated w/ POBA. No stent. She was readmitted in April 2009 for chest pain and elevated troponin I (peaked at 1.03). She underwent subsequent LHC which showed normal coronaries. EF was normal  at 50-55%. LVEDP also normal. Her CP in 2019 was felt likely secondary to poorly controlled HTN. Her antihypertensives were increased and amlodipine was also added. Dr. Harrington Challenger recommended renal artery dopplers to r/o RAS but this was never completed.   She presented to the [facility] ED yesterday evening given concerns w/ elevated BP. She was seen by her PCP earlier in clinic yesterday for HTN and recurrent HAs. She had also endorsed recent intermitted CP improved w/ SL NTG. BP was elevated at PCP office at 180/120. They also ordered a hs troponin which was abnormal (H&P reports trop was 82). Hayley Miller was advised to go to the ED for further evaluation. Repeat hs troponin at [facility] was 69. F/u troponin 70. BP severely elevated in the ED, as high as 200/140. EKG shows LAFB, 84 bpm. CBC unremarkable. BMP showed mild renal insuffiencey, w/ SCr at 1.36. She was given IV labetalol and started on a nitro patch and her home meds were ordered and BP improved. Most recent 143/98. Currently complains of a HA. Nitro patch recently removed. No current CP.   Hayley Miller reports that when she went to see her PCP she found out that she was not taking a BP medication that had been previously prescribed. She had been taking all of her other meds. She cannot recall the name of the pill. She denies any current tobacco use but is a former smoker. She admits to eating a lot of salt. Eats mostly take out. Echo tech currently at bedside doing [place].   Heart Pathway Score:  Past Medical History:  Diagnosis Date  . Anemia   . Anxiety   . CAD (coronary artery disease)    a. s/p AL MI in 2006 tx with PTCA to LAD;  b. LHC (10/2009): lum irregs in LAD, o/w no CAD, EF 55-60%;  c. Myoview (07/2011):  no ischemia, EF 63%;  d. Echo (11/2011):  mild LVH, EF 60-65%, Gr 1 DD;  e.  Lexiscan Myoview (04/2013):  Inferior bowel atten artifact with inferoapical defect; no ischemia; EF 58% (low risk)  . Cameron lesion, acute   . Chronic lower back pain   . Chronic pain  syndrome   . Daily headache   . Depression   . Erosive esophagitis   . Fibromyalgia   . GERD (gastroesophageal reflux disease)   . Hemorrhage    upper gastrointestional. GI bleed 10/09 due to NSAID  . Hiatal hernia   . HLD (hyperlipidemia)   . HTN (hypertension)   . Myocardial infarction ([place]) 2006 X 2  . Rhinitis   . Seasonal allergies     Past Surgical History:  Procedure Laterality Date  . BLADDER SUSPENSION N/A 05/08/2013   Procedure: TRANSVAGINAL TAPE (TVT) PROCEDURE;  Surgeon: Olga Millers, MD;  Location: [facility];  Service: Gynecology;  Laterality: N/A;  . BREAST CYST EXCISION Left 2002  . CARDIAC CATHETERIZATION  07/19/2017  . CORONARY ANGIOPLASTY  2006  . CYSTOSCOPY N/A 05/08/2013   Procedure: CYSTOSCOPY;  Surgeon: Olga Millers, MD;  Location: [facility];  Service: Gynecology;  Laterality: N/A;  . LAPAROSCOPIC ASSISTED VAGINAL HYSTERECTOMY N/A 05/08/2013   Procedure: LAPAROSCOPIC ASSISTED VAGINAL HYSTERECTOMY;  Surgeon: Olga Millers, MD;  Location: [facility];  Service: Gynecology;  Laterality: N/A;  . LAPAROSCOPIC BILATERAL SALPINGO OOPHERECTOMY N/A 05/08/2013   Procedure: LAPAROSCOPIC BILATERAL SALPINGO OOPHORECTOMY;  Surgeon: Olga Millers, MD;  Location: [facility];  Service: Gynecology;  Laterality: N/A;  . LEFT HEART CATH AND CORONARY ANGIOGRAPHY N/A 07/19/2017   Procedure: LEFT HEART CATH AND CORONARY ANGIOGRAPHY;  Surgeon: Martinique, Peter M, MD;  Location: [facility];  Service: Cardiovascular;  Laterality: N/A;  . UPPER GASTROINTESTINAL ENDOSCOPY       Home Medications:  Prior to Admission medications   Medication Sig Start Date End Date Taking? Authorizing Provider  acetaminophen (TYLENOL) 325 MG tablet Take 650 mg by mouth every 6 (six) hours as needed for headache.   Yes [provider]  amLODipine (NORVASC) 10 MG tablet Take 1 tablet (10 mg total) by mouth daily. 12/14/18  Yes Hoyt Koch, MD  aspirin EC 81 MG tablet Take 81 mg by mouth  daily.   Yes [provider]  cetirizine (ZYRTEC) 10 MG tablet Take 1 tablet (10 mg total) by mouth daily. 12/08/18  Yes Marrian Salvage, FNP  esomeprazole (NEXIUM) 40 MG capsule TAKE 1 CAPSULE AT 12:00 NOON EACH [place]. Patient taking differently: Take 40 mg by mouth daily at 12 noon.  12/08/18  Yes Marrian Salvage, FNP  fluticasone [facility]) 50 MCG/ACT nasal spray Place 2 sprays into both nostrils daily as needed for allergies. 12/08/18  Yes Marrian Salvage, FNP  hydrALAZINE (APRESOLINE) 25 MG tablet Take 1 tablet (25 mg total) by mouth 3 (three) times daily. 12/14/18  Yes Hoyt Koch, MD  losartan-hydrochlorothiazide (HYZAAR) 100-25 MG tablet Take 1 tablet by mouth daily. 12/14/18  Yes Hoyt Koch, MD  metoprolol tartrate (LOPRESSOR) 25 MG tablet Take 1 tablet (25 mg total) by mouth 2 (two) times daily.  12/14/18  Yes Hoyt Koch, MD  Multiple Vitamin (MULTIVITAMIN WITH MINERALS) TABS tablet Take 1 tablet by mouth daily.   Yes [provider]  nitroGLYCERIN (NITROSTAT) 0.4 MG SL tablet Place 1 tablet (0.4 mg total) under the tongue every 5 (five) minutes as needed for chest pain. 04/29/17  Yes Hoyt Koch, MD  pravastatin (PRAVACHOL) 40 MG tablet TAKE (1) TABLET DAILY AT BEDTIME. Patient taking differently: Take 40 mg by mouth daily.  10/25/18  Yes Hoyt Koch, MD  Tetrahydrozoline HCl (VISINE OP) Place 1 drop into both eyes daily as needed (dry eyes).   Yes [provider]    Inpatient Medications: Scheduled Meds: . amLODipine  10 mg Oral Daily  . aspirin EC  81 mg Oral Daily  . carvedilol  25 mg Oral BID WC  . hydrALAZINE  100 mg Oral TID  . losartan  100 mg Oral Daily   And  . hydrochlorothiazide  25 mg Oral Daily  . multivitamin with minerals  1 tablet Oral Daily  . pantoprazole  80 mg Oral Q1200  . pravastatin  40 mg Oral q1800   Continuous Infusions: . heparin 900 Units/hr (12/15/18 0922)    PRN Meds: acetaminophen, fluticasone, labetalol, ondansetron **OR** ondansetron (ZOFRAN) IV, tetrahydrozoline  Allergies:    Allergies  Allergen Reactions  . Clonidine Hydrochloride Other (See Comments)    REACTION: FATIGUE  . Codeine Other (See Comments)    Unknown reaction (patient reports taking cough syrup with codeine)  . Ibuprofen Other (See Comments)    GI bleed  . Lovastatin Other (See Comments)    Join pain    Social History:   Social History   Socioeconomic History  . Marital status: Divorced    Spouse name: Not on file  . Number of children: 1  . Years of education: Not on file  . Highest education level: Not on file  Occupational History  . Occupation: unemployed    Fish farm manager: UNEMPLOYED  Social Needs  . Financial resource strain: Not on file  . Food insecurity    Worry: Not on file    Inability: Not on file  . Transportation needs    Medical: Not on file    Non-medical: Not on file  Tobacco Use  . Smoking status: Former Smoker    Packs/Honse: 1.00    Years: 3.00    Pack years: 3.00    Types: Cigarettes    Quit date: 04/17/2017    Years since quitting: 1.6  . Smokeless tobacco: Never Used  . Tobacco comment: Divorced, lives with female domestic partner  Substance and Sexual Activity  . Alcohol use: Yes    Comment: 07/20/2017 "might drink once or twice/year"  . Drug use: No  . Sexual activity: Yes    Birth control/protection: Other-see comments    Comment: has a female domestic partner  Lifestyle  . Physical activity    Days per week: Not on file    Minutes per session: Not on file  . Stress: Not on file  Relationships  . Social Herbalist on phone: Not on file    Gets together: Not on file    Attends religious service: Not on file    Active member of club or organization: Not on file    Attends meetings of clubs or organizations: Not on file    Relationship status: Not on file  . Intimate partner violence    Fear of current or ex  partner: Not on file    Emotionally abused: Not on file    Physically abused: Not on file    Forced sexual activity: Not on file  Other Topics Concern  . Not on file  Social History Narrative   Lives alone - divorced; 1 child.    Used to live with female domestic partner   Unemployed - disability.           Family History:    Family History  Problem Relation Age of Onset  . Hypertension Mother   . Heart murmur Mother   . Colon polyps Mother   . Colon cancer Maternal Aunt   . Heart disease Son   . Heart disease Maternal Grandmother   . Diabetes Maternal Aunt   . Esophageal cancer Neg Hx   . Rectal cancer Neg Hx   . Stomach cancer Neg Hx      ROS:  Please see the history of present illness.   All other ROS reviewed and negative.     Physical Exam/Data:   Vitals:   12/15/18 1000 12/15/18 1024 12/15/18 1200 12/15/18 1244  BP: (!) 188/111 (!) 182/121 (!) 148/95 (!) 143/98  Pulse: 78 78  81  Resp: 15     Temp:    98.2 F (36.8 C)  TempSrc:    Oral  SpO2:      Weight:      Height:        Intake/Output Summary (Last 24 hours) at 12/15/2018 1435 Last data filed at 12/15/2018 0436 Gross per 24 hour  Intake 62.36 ml  Output -  Net 62.36 ml   Last 3 Weights 12/14/2018 12/14/2018 03/09/2018  Weight (lbs) 170 lb 171 lb 175 lb  Weight (kg) 77.111 kg 77.565 kg 79.379 kg  Some encounter information is confidential and restricted. Go to Review Flowsheets activity to see all data.     Body mass index is 29.18 kg/m.  General:  Well nourished, well developed, in no acute distress HEENT: normal Lymph: no adenopathy Neck: no JVD Endocrine:  No thryomegaly Vascular: No carotid bruits; FA pulses 2+ bilaterally without bruits  Cardiac:  normal S1, S2; RRR; no murmur Lungs:  clear to auscultation bilaterally, no wheezing, rhonchi or rales  Abd: soft, nontender, no hepatomegaly  Ext: no edema Musculoskeletal:  No deformities, BUE and BLE strength normal and equal Skin: warm  and dry  Neuro:  CNs 2-12 intact, no focal abnormalities noted Psych:  Normal affect   EKG:  The EKG was personally reviewed and demonstrates:  NSR, LAFB 62 bpm  Telemetry:  Telemetry was personally reviewed and demonstrates:  NSR 70s.  Relevant CV Studies: LHC 07/19/17 Procedures  LEFT HEART CATH AND CORONARY ANGIOGRAPHY  Conclusion    The left ventricular systolic function is normal.  LV end diastolic pressure is normal.  The left ventricular ejection fraction is 50-55% by visual estimate.   1. Normal coronary anatomy 2. Overall normal LV function. Localized akinesis of the inferior apex 3. Normal LVEDP  Plan: medical management including optimization of BP control.       Laboratory Data:  High Sensitivity Troponin:   Recent Labs  Lab 12/14/18 1900 12/14/18 2024  TROPONINIHS 69* 70*     Chemistry Recent Labs  Lab 12/14/18 1620 12/15/18 0752  NA 141 142  K 4.1 3.2*  CL 104 104  CO2 30 28  GLUCOSE 99 103*  BUN 20 20  CREATININE 1.36* 1.01*  CALCIUM 10.8* 10.2  GFRNONAA  --  >  60  GFRAA  --  >60  ANIONGAP  --  10    Recent Labs  Lab 12/14/18 1620  PROT 7.7  ALBUMIN 4.4  AST 18  ALT 20  ALKPHOS 74  BILITOT 0.4   Hematology Recent Labs  Lab 12/14/18 1620 12/15/18 0752  WBC 8.8 9.5  RBC 4.77 4.80  HGB 14.9 15.0  HCT 44.5 46.3*  MCV 93.3 96.5  MCH  --  31.3  MCHC 33.4 32.4  RDW 13.5 13.2  PLT 348.0 317   BNP Recent Labs  Lab 12/14/18 1620  PROBNP 61.0    DDimer No results for input(s): DDIMER in the last 168 hours.   Radiology/Studies:  Dg Chest 2 View  Result Date: 12/14/2018 CLINICAL DATA:  Hayley Miller reports that she had doctors appt today and had lab work done then called and told to go to ED for elevated lab. In chart reads troponin was in 80s. Hx NSTEMI. Denies pains. EXAM: CHEST - 2 VIEW COMPARISON:  09/22/2017 FINDINGS: Cardiac silhouette is normal in size. No mediastinal or hilar masses. No evidence of adenopathy. Lungs are  hyperexpanded but clear. No pleural effusion or pneumothorax. Skeletal structures are intact. IMPRESSION: No active cardiopulmonary disease. Electronically Signed   By: Lajean Manes M.D.   On: 12/14/2018 19:08    Assessment and Plan:   Hayley Miller is a 58 y.o. female with a hx of poorly controlled HTN, prior anteroapical infarct w/ occluded distal LAD in 2006 that was treated w/ POBA with repeat LHC in 2019 showing normal coronaries, HLD, GERD and h/o GIB due to NSAID use in 2009, who is being seen today for the evaluation of chest pain, at the request of Dr. Louanne Belton, Internal Medicine.   1. Chest Pain: required POBA to distal LAD in 2006 and repeat LHC in 06/2017 showed normal coronaries. Suspect recent CP is liklely due to severely elevated BP. BP was 200/140 in the ED. Appears to be some compliance issues w/ medications. BP improved w/ medications and CP currently resolved. Hs troponin is mildly elevated at 69>>70 but level and trend not c/w acute MI. Suspect troponin leak is from demand ischemia from hypertensive emergency. 2D echo pending. If no new LV dysfunction or wall motion abnormalties to suggest coronary ischemia, would not recommend further inpatient ischemic w/u. Can continue to treat medically and control BP and have her f/u with Dr. Harrington Challenger to reassess. If recurrent CP despite satisfactory BP control, can consider coronary CTA vs NST.   2. HTN: severely elevated in the ED upon arrival. Suspect there is some compliance issues. BP overall improved now that meds have been restarted but still elevated. Agree w/ changing  blocker from metoprolol to Coreg. Continue amlodipine, losartan and hydrochlorothiazide. We will add low dose spironolactone, 12.5 mg daily. F/u BMP in the AM. Consider outpatient renal artery doppler to r/o RAS. Hayley Miller also needs to reduce sodium intake. She admits to eating mostly take out/fast food.   For questions or updates, please contact [place] Please consult  [email or web address] for contact info under     Signed, Lyda Jester, PA-C  12/15/2018 2:35 PM

## 2018-12-15 NOTE — Progress Notes (Addendum)
[place] for IV heparin Indication: chest pain/ACS  Allergies  Allergen Reactions  . Clonidine Hydrochloride Other (See Comments)    REACTION: FATIGUE  . Codeine Other (See Comments)    Unknown reaction (patient reports taking cough syrup with codeine)  . Ibuprofen Other (See Comments)    GI bleed  . Lovastatin Other (See Comments)    Join pain    Patient Measurements: Height: 5\' 4"  (162.6 cm) Weight: 170 lb (77.1 kg) IBW/kg (Calculated) : 54.7 Heparin Dosing Weight: 62 kg  Vital Signs: Temp: 98.2 F (36.8 C) (09/18 1244) Temp Source: Oral (09/18 1244) BP: 175/117 (09/18 1540) Pulse Rate: 79 (09/18 1540)  Labs: Recent Labs    12/14/18 1620 12/14/18 1900 12/14/18 2024 12/14/18 2244 12/15/18 0752 12/15/18 1605  HGB 14.9  --   --   --  15.0  --   HCT 44.5  --   --   --  46.3*  --   PLT 348.0  --   --   --  317  --   APTT  --   --   --  31  --   --   LABPROT  --   --   --  13.2  --   --   INR  --   --   --  1.0  --   --   HEPARINUNFRC  --   --   --   --  0.24* 0.48  CREATININE 1.36*  --   --   --  1.01*  --   TROPONINIHS  --  69* 70*  --   --   --     Estimated Creatinine Clearance: 62.5 mL/min (A) (by C-G formula based on SCr of 1.01 mg/dL (H)).  Medications:  Scheduled:  . amLODipine  10 mg Oral Daily  . aspirin EC  81 mg Oral Daily  . carvedilol  25 mg Oral BID WC  . hydrALAZINE  100 mg Oral TID  . losartan  100 mg Oral Daily   And  . hydrochlorothiazide  25 mg Oral Daily  . loratadine  10 mg Oral Daily  . multivitamin with minerals  1 tablet Oral Daily  . pantoprazole  80 mg Oral Q1200  . pravastatin  40 mg Oral q1800  . spironolactone  12.5 mg Oral Daily   Infusions:  . heparin 900 Units/hr (12/15/18 2694)    Assessment: 53 yoF with PMH CAD (MY s/p PTCA to LAD in 2006 and NSTEMI 4/19), HTN, sent from PCP office for uncontrolled BP and elevated troponins; also notes HA and intermittent CP over past few days.  Admitting for HTN urgency and ischemic r/o; given ASA and pharmacy consulted to dose IV heparin. Has not needed NTG drip this admission.   Baseline INR, aPTT: WNL  Prior anticoagulation: none  Significant events:  Today, 12/15/2018:  CBC: WNL  Troponins flat at 0.07  Most recent heparin level SUBtherapeutic on 750 units/hr  No bleeding or infusion issues per nursing  Goal of Therapy: Heparin level 0.3-0.7 units/ml Monitor platelets by anticoagulation protocol: Yes  Plan:  Increase heparin IV infusion to 900 units/hr  Check heparin level 6 hrs after rate change  Daily CBC, daily heparin level once stable  Monitor for signs of bleeding or thrombosis   Reuel Boom, PharmD, BCPS [phone] 12/15/2018, 4:50 PM   ADDENDUM  Repeat heparin level therapeutic after rate increase to 900 units/hr  Continue heparin at 900 units/hr  Recheck  confirmatory level with AM labs  F/u planned LOT for heparin infusion  Reuel Boom, PharmD, BCPS [phone] 12/15/2018, 4:50 PM

## 2018-12-16 ENCOUNTER — Inpatient Hospital Stay (HOSPITAL_COMMUNITY): Payer: Medicare Other

## 2018-12-16 DIAGNOSIS — R51 Headache: Secondary | ICD-10-CM

## 2018-12-16 DIAGNOSIS — I10 Essential (primary) hypertension: Secondary | ICD-10-CM

## 2018-12-16 LAB — HEPARIN LEVEL (UNFRACTIONATED): Heparin Unfractionated: 0.48 IU/mL (ref 0.30–0.70)

## 2018-12-16 LAB — BASIC METABOLIC PANEL
Anion gap: 13 (ref 5–15)
BUN: 24 mg/dL — ABNORMAL HIGH (ref 6–20)
CO2: 25 mmol/L (ref 22–32)
Calcium: 10.5 mg/dL — ABNORMAL HIGH (ref 8.9–10.3)
Chloride: 101 mmol/L (ref 98–111)
Creatinine, Ser: 1.14 mg/dL — ABNORMAL HIGH (ref 0.44–1.00)
GFR calc Af Amer: >60 mL/min (ref >60)
GFR calc non Af Amer: 54 mL/min — ABNORMAL LOW (ref >60)
Glucose, Bld: 100 mg/dL — ABNORMAL HIGH (ref 70–99)
Potassium: 3.2 mmol/L — ABNORMAL LOW (ref 3.5–5.1)
Sodium: 139 mmol/L (ref 135–145)

## 2018-12-16 LAB — CBC
HCT: 44.8 % (ref 36.0–46.0)
Hemoglobin: 14.3 g/dL (ref 12.0–15.0)
MCH: 30.6 pg (ref 26.0–34.0)
MCHC: 31.9 g/dL (ref 30.0–36.0)
MCV: 95.7 fL (ref 80.0–100.0)
Platelets: 329 10*3/uL (ref 150–400)
RBC: 4.68 MIL/uL (ref 3.87–5.11)
RDW: 13.1 % (ref 11.5–15.5)
WBC: 7.4 10*3/uL (ref 4.0–10.5)
nRBC: 0 % (ref 0.0–0.2)

## 2018-12-16 LAB — MAGNESIUM: Magnesium: 2 mg/dL (ref 1.7–2.4)

## 2018-12-16 MED ORDER — SPIRONOLACTONE 25 MG PO TABS
25.0000 mg | ORAL_TABLET | Freq: Every day | ORAL | Status: DC
  Administered 2018-12-16 – 2018-12-17 (×2): 25 mg via ORAL
  Filled 2018-12-16 (×2): qty 1

## 2018-12-16 MED ORDER — POTASSIUM CHLORIDE 20 MEQ PO PACK
40.0000 meq | PACK | Freq: Once | ORAL | Status: AC
  Administered 2018-12-16: 40 meq via ORAL
  Filled 2018-12-16: qty 2

## 2018-12-16 NOTE — Progress Notes (Signed)
8:41 AM Spoke to [employer]. Patient has not had breakfast. She will hold tray until Renal artery duplex can be completed this morning.   June Leap, BS, RDMS, RVT Vascular lab

## 2018-12-16 NOTE — Progress Notes (Signed)
Renal artery duplex       has been completed. Preliminary results can be found under CV proc through chart review. Onesimo Lingard, BS, RDMS, RVT   

## 2018-12-16 NOTE — Progress Notes (Signed)
Progress Note  Patient Name: Hayley Miller Date of Encounter: 12/16/2018  Primary Cardiologist: Dorris Carnes, MD   Subjective   No CP   Breathing is OK    Inpatient Medications    Scheduled Meds: . amLODipine  10 mg Oral Daily  . aspirin EC  81 mg Oral Daily  . carvedilol  25 mg Oral BID WC  . hydrALAZINE  100 mg Oral TID  . losartan  100 mg Oral Daily   And  . hydrochlorothiazide  25 mg Oral Daily  . loratadine  10 mg Oral Daily  . multivitamin with minerals  1 tablet Oral Daily  . pantoprazole  80 mg Oral Q1200  . pravastatin  40 mg Oral q1800  . spironolactone  12.5 mg Oral Daily   Continuous Infusions: . heparin 900 Units/hr (12/16/18 0415)   PRN Meds: acetaminophen, fluticasone, labetalol, ondansetron **OR** ondansetron (ZOFRAN) IV, tetrahydrozoline   Vital Signs    Vitals:   12/15/18 2237 12/15/18 2357 12/16/18 0107 12/16/18 0505  BP: (!) 181/121 (!) 153/116 (!) 160/103 (!) 161/97  Pulse: 81  70 71  Resp:    16  Temp:    98.3 F (36.8 C)  TempSrc:    Oral  SpO2:    94%  Weight:      Height:        Intake/Output Summary (Last 24 hours) at 12/16/2018 0722 Last data filed at 12/16/2018 0415 Gross per 24 hour  Intake 168.18 ml  Output -  Net 168.18 ml   Last 3 Weights 12/14/2018 12/14/2018 03/09/2018  Weight (lbs) 170 lb 171 lb 175 lb  Weight (kg) 77.111 kg 77.565 kg 79.379 kg  Some encounter information is confidential and restricted. Go to Review Flowsheets activity to see all data.      Telemetry    SR   - Personally Reviewed  ECG    Not done  - Personally Reviewed  Physical Exam   GEN: No acute distress.   Neck: No JVD Cardiac: RRR, no murmurs, rubs, or gallops.  Respiratory: Clear to auscultation bilaterally. GI: Soft, nontender, non-distended  MS: No edema; No deformity. Neuro:  Nonfocal  Psych: Normal affect   Labs    High Sensitivity Troponin:   Recent Labs  Lab 12/14/18 1900 12/14/18 2024  TROPONINIHS 69* 70*       Chemistry Recent Labs  Lab 12/14/18 1620 12/15/18 0752 12/16/18 0358  NA 141 142 139  K 4.1 3.2* 3.2*  CL 104 104 101  CO2 30 28 25   GLUCOSE 99 103* 100*  BUN 20 20 24*  CREATININE 1.36* 1.01* 1.14*  CALCIUM 10.8* 10.2 10.5*  PROT 7.7  --   --   ALBUMIN 4.4  --   --   AST 18  --   --   ALT 20  --   --   ALKPHOS 74  --   --   BILITOT 0.4  --   --   GFRNONAA  --  >60 54*  GFRAA  --  >60 >60  ANIONGAP  --  10 13     Hematology Recent Labs  Lab 12/14/18 1620 12/15/18 0752 12/16/18 0358  WBC 8.8 9.5 7.4  RBC 4.77 4.80 4.68  HGB 14.9 15.0 14.3  HCT 44.5 46.3* 44.8  MCV 93.3 96.5 95.7  MCH  --  31.3 30.6  MCHC 33.4 32.4 31.9  RDW 13.5 13.2 13.1  PLT 348.0 317 329    BNP Recent Labs  Lab 12/14/18 1620  PROBNP 61.0     DDimer No results for input(s): DDIMER in the last 168 hours.   Radiology    Dg Chest 2 View  Result Date: 12/14/2018 CLINICAL DATA:  Pt reports that she had doctors appt today and had lab work done then called and told to go to ED for elevated lab. In chart reads troponin was in 80s. Hx NSTEMI. Denies pains. EXAM: CHEST - 2 VIEW COMPARISON:  09/22/2017 FINDINGS: Cardiac silhouette is normal in size. No mediastinal or hilar masses. No evidence of adenopathy. Lungs are hyperexpanded but clear. No pleural effusion or pneumothorax. Skeletal structures are intact. IMPRESSION: No active cardiopulmonary disease. Electronically Signed   By: Lajean Manes M.D.   On: 12/14/2018 19:08    Cardiac Studies   LHC 07/19/17 Procedures  LEFT HEART CATH AND CORONARY ANGIOGRAPHY  Conclusion    The left ventricular systolic function is normal.  LV end diastolic pressure is normal.  The left ventricular ejection fraction is 50-55% by visual estimate.  1. Normal coronary anatomy 2. Overall normal LV function. Localized akinesis of the inferior apex 3. Normal LVEDP     Patient Profile    Hayley Miller is a 58 y.o. female with a hx of poorly  controlled HTN, prior anteroapical infarct w/ occluded distal LAD in 2006 that was treated w/ POBA with repeat LHC in 2019 showing normal coronaries, HLD, GERD and h/o GIB due to NSAID use in 2009, who is being seen today for the evaluation of chest pain, at the request of Dr. Louanne Belton, Internal Medicine.   Assessment & Plan    1. Chest pain  Pt currently without CP   I think this Pressure/discomfort was most likely due to HTN out of control.  I am not convinced of activie ischemia  HS troponin 70       I would stop heparin   2  HTN  BP remains elevated    Note that spironolactone added today  I would recomm increasing dose to 25 mg   Follow   BP is improving    For questions or updates, please contact [place] Please consult [email or web address] for contact info under        Signed, Dorris Carnes, MD  12/16/2018, 7:22 AM

## 2018-12-17 DIAGNOSIS — I701 Atherosclerosis of renal artery: Secondary | ICD-10-CM

## 2018-12-17 DIAGNOSIS — E7800 Pure hypercholesterolemia, unspecified: Secondary | ICD-10-CM

## 2018-12-17 LAB — CBC
HCT: 48.1 % — ABNORMAL HIGH (ref 36.0–46.0)
Hemoglobin: 15.4 g/dL — ABNORMAL HIGH (ref 12.0–15.0)
MCH: 31 pg (ref 26.0–34.0)
MCHC: 32 g/dL (ref 30.0–36.0)
MCV: 97 fL (ref 80.0–100.0)
Platelets: 353 10*3/uL (ref 150–400)
RBC: 4.96 MIL/uL (ref 3.87–5.11)
RDW: 13.2 % (ref 11.5–15.5)
WBC: 8.2 10*3/uL (ref 4.0–10.5)
nRBC: 0 % (ref 0.0–0.2)

## 2018-12-17 LAB — POTASSIUM: Potassium: 4.5 mmol/L (ref 3.5–5.1)

## 2018-12-17 MED ORDER — SPIRONOLACTONE 25 MG PO TABS: 25.0000 mg | ORAL_TABLET | Freq: Every day | ORAL | 1 refills | Status: DC

## 2018-12-17 MED ORDER — HYDRALAZINE HCL 25 MG PO TABS: 100.0000 mg | ORAL_TABLET | Freq: Three times a day (TID) | ORAL | 1 refills | Status: DC

## 2018-12-17 MED ORDER — CARVEDILOL 25 MG PO TABS: 25.0000 mg | ORAL_TABLET | Freq: Two times a day (BID) | ORAL | 2 refills | Status: DC

## 2018-12-17 NOTE — Discharge Summary (Signed)
Physician Discharge Summary  Hayley Miller QIW:[id number] DOB: [birth year] DOA: 12/14/2018  PCP: Hoyt Koch, MD  Admit date: 12/14/2018 Discharge date: 12/17/2018 Consultations: Cardiology  Admitted From: home Disposition: home  Discharge Diagnoses:  Principal Problem:   Hypertensive urgency Active Problems:   Renal artery stenosis ([place])   Hypercholesterolemia   Severe uncontrolled hypertension   CAD (coronary artery disease)  Hospital Course Summary:  58 y.o.femalewith medical history significant ofHTN, CAD,MI in 2006 requiring PTCA to LAD, most recent LHC in 06/2017 for chest pain complaints showed normal coronaries without any mention of disease, h/o NSAID induced UGIB IN 2009, presentedto the ED for concerns elevated BP, recurrent headaches over the past few days and intermittent episodes of CP, most recently 2 days PTA. PCP refilled meds and was going to start amlodipine 10mg  in addition to other home meds. BP was 180/120 at PCPs office and HS Troponin of 82.Patient was sent in to ED for further evaluation. ED Course:CP free, repeat trops were 69 and 70. BP 190s/120s, and running as high as 200/140.Repeat hs troponin at [facility] was 69.  EKG shows LAFB, 84 bpm. CBC unremarkable. BMP showed mild renal insuffiencey, w/ SCr at 1.36.She was given IV labetalol and started on a nitro patch along with her home meds and BP improved. Patient admitted to Hospitalist service with cardiology consultation.  1. Atypical Chest pain: Her CP in 2019 was felt likely secondary to poorly controlled HTN. Her antihypertensives were increased ( She is on Metoprolol/ Losartan/HCTZ at baseline) and amlodipine was also added at that time. Dr. Harrington Challenger recommended renal artery dopplers to r/o RAS but this was never completed. Her, F/u troponin at 70. Her medications were adjusted in this admission including transition to Coreg from metoprolol, adding Aldactone and increasing Hydralazine dose. No current CP.  Cardiology feels chest pain is atypical and her presenting symptoms likely related to accelerated HTN. They feel minimal elevation in troponin is related to demand and that patient does not require an ischemic work-up if no recurring episodes of chest pain. D/Ced heparin infusion that was started on admission. Last cath in 2019 showed normal coronaries. Continue asa, metoprolol changed to Coreg. Nitropatch d/ced due to headache.   2. Accelerated HTN: Meds adjusted by cardiology as discussed above. SBP now improved to [id number], diastolic fluctuating in the 90-100 range. Nitro patch recently removed due to headaches. The have resumed Amlodipine and increased Spironolactone to 25 mg daily. Renal dopplers done as inpatient reveal unilateral RAS (>60%). Cardiology recommended discharge on current medication regimen (paged [facility] to check if ok to continue Losartan-not heard back) and Dr Harrington Challenger set up f/u visit in their clinic to recommend further based on her d/w Intervention cardiology. Continue Asa/statins.  3. Headaches : related to chronic sinusitis vs accelearted HTN vs medication induced. BP improved. Nitro patch discontinued. Tolerating Norvasc well. Continue Loratidine and flonase spray upon discharge, f/u PCP.    4. Hyperlipidemia: Resume statins  5. H/O GI bleed: in the setting of NSAID use. Stable Hgb on low dose ASA  6. Mild Hypokalemia: Replaced.   Discharge Exam:  Vitals:   12/17/18 0907 12/17/18 1135  BP: 120/84 (!) 125/91  Pulse: 89 88  Resp:    Temp:    SpO2:  94%   Vitals:   12/16/18 2028 12/17/18 0507 12/17/18 0907 12/17/18 1135  BP: (!) 146/99 (!) 143/83 120/84 (!) 125/91  Pulse: 92 86 89 88  Resp: 16 16    Temp: 98.2 F (36.8 C)  98.7 F (37.1 C)    TempSrc: Oral Oral    SpO2: 97% 94%  94%  Weight:      Height:        General: Pt is alert, awake, not in acute distress Cardiovascular: RRR, S1/S2 +, no rubs, no gallops Respiratory: CTA bilaterally, no wheezing, no  rhonchi Abdominal: Soft, NT, ND, bowel sounds + Extremities: no edema, no cyanosis  Discharge Condition:Stable CODE STATUS: Full code Diet recommendation: low salt, low fat Recommendations for Outpatient Follow-up:  1. Follow up with PCP: 5 days 2. Follow up with consultants: Cardiology in 2 weeks as scheduled 3. Please obtain follow up labs including: BMP in 5 days  Home Health services upon discharge: no Equipment/Devices upon discharge:no   Discharge Instructions:  Discharge Instructions    Call MD for:   Complete by: As directed    Persistent uncontrolled BP   Call MD for:  difficulty breathing, headache or visual disturbances   Complete by: As directed    Call MD for:  persistant dizziness or light-headedness   Complete by: As directed    Call MD for:  persistant nausea and vomiting   Complete by: As directed    Call MD for:  severe uncontrolled pain   Complete by: As directed    Diet - low sodium heart healthy   Complete by: As directed    Increase activity slowly   Complete by: As directed      Allergies as of 12/17/2018      Reactions   Clonidine Hydrochloride Other (See Comments)   REACTION: FATIGUE   Codeine Other (See Comments)   Unknown reaction (patient reports taking cough syrup with codeine)   Ibuprofen Other (See Comments)   GI bleed   Lovastatin Other (See Comments)   Join pain      Medication List    STOP taking these medications   metoprolol tartrate 25 MG tablet Commonly known as: LOPRESSOR     TAKE these medications   acetaminophen 325 MG tablet Commonly known as: TYLENOL Take 650 mg by mouth every 6 (six) hours as needed for headache.   amLODipine 10 MG tablet Commonly known as: NORVASC Take 1 tablet (10 mg total) by mouth daily.   aspirin EC 81 MG tablet Take 81 mg by mouth daily.   carvedilol 25 MG tablet Commonly known as: COREG Take 1 tablet (25 mg total) by mouth 2 (two) times daily with a meal.   cetirizine 10 MG  tablet Commonly known as: ZYRTEC Take 1 tablet (10 mg total) by mouth daily.   esomeprazole 40 MG capsule Commonly known as: NEXIUM TAKE 1 CAPSULE AT 12:00 NOON EACH Sal[place]. What changed:   how much to take  how to take this  when to take this  additional instructions   fluticasone 50 MCG/ACT nasal spray Commonly known as: FLONASE Place 2 sprays into both nostrils daily as needed for allergies.   hydrALAZINE 25 MG tablet Commonly known as: APRESOLINE Take 4 tablets (100 mg total) by mouth 3 (three) times daily. What changed: how much to take   losartan-hydrochlorothiazide 100-25 MG tablet Commonly known as: HYZAAR Take 1 tablet by mouth daily.   multivitamin with minerals Tabs tablet Take 1 tablet by mouth daily.   nitroGLYCERIN 0.4 MG SL tablet Commonly known as: NITROSTAT Place 1 tablet (0.4 mg total) under the tongue every 5 (five) minutes as needed for chest pain.   pravastatin 40 MG tablet Commonly known as: PRAVACHOL TAKE (  1) TABLET DAILY AT BEDTIME. What changed: See the new instructions.   spironolactone 25 MG tablet Commonly known as: ALDACTONE Take 1 tablet (25 mg total) by mouth daily. Start taking on: December 18, 2018   VISINE OP Place 1 drop into both eyes daily as needed (dry eyes).       Allergies  Allergen Reactions  . Clonidine Hydrochloride Other (See Comments)    REACTION: FATIGUE  . Codeine Other (See Comments)    Unknown reaction (patient reports taking cough syrup with codeine)  . Ibuprofen Other (See Comments)    GI bleed  . Lovastatin Other (See Comments)    Join pain      The results of significant diagnostics from this hospitalization (including imaging, microbiology, ancillary and laboratory) are listed below for reference.    Labs: BNP (last 3 results) No results for input(s): BNP in the last 8760 hours. Basic Metabolic Panel: Recent Labs  Lab 12/14/18 1620 12/15/18 0752 12/16/18 0358 12/17/18 0927  NA 141 142  139  --   K 4.1 3.2* 3.2* 4.5  CL 104 104 101  --   CO2 30 28 25   --   GLUCOSE 99 103* 100*  --   BUN 20 20 24*  --   CREATININE 1.36* 1.01* 1.14*  --   CALCIUM 10.8* 10.2 10.5*  --   MG  --   --  2.0  --    Liver Function Tests: Recent Labs  Lab 12/14/18 1620  AST 18  ALT 20  ALKPHOS 74  BILITOT 0.4  PROT 7.7  ALBUMIN 4.4   No results for input(s): LIPASE, AMYLASE in the last 168 hours. No results for input(s): AMMONIA in the last 168 hours. CBC: Recent Labs  Lab 12/14/18 1620 12/15/18 0752 12/16/18 0358 12/17/18 0454  WBC 8.8 9.5 7.4 8.2  HGB 14.9 15.0 14.3 15.4*  HCT 44.5 46.3* 44.8 48.1*  MCV 93.3 96.5 95.7 97.0  PLT 348.0 317 329 353   Cardiac Enzymes: No results for input(s): CKTOTAL, CKMB, CKMBINDEX, TROPONINI in the last 168 hours. BNP: Invalid input(s): POCBNP CBG: No results for input(s): GLUCAP in the last 168 hours. D-Dimer No results for input(s): DDIMER in the last 72 hours. Hgb A1c Recent Labs    12/14/18 1620  HGBA1C 5.4   Lipid Profile Recent Labs    12/14/18 1620  CHOL 181  HDL 40.10  LDLCALC 101*  TRIG 196.0*  CHOLHDL 5   Thyroid function studies Recent Labs    12/14/18 1620  TSH 2.04   Anemia work up No results for input(s): VITAMINB12, FOLATE, FERRITIN, TIBC, IRON, RETICCTPCT in the last 72 hours. Urinalysis    Component Value Date/Time   COLORURINE YELLOW 09/03/2008 2133   APPEARANCEUR CLOUDY (A) 09/03/2008 2133   LABSPEC 1.024 09/03/2008 2133   PHURINE 7.5 09/03/2008 2133   GLUCOSEU NEGATIVE 09/03/2008 2133   GLUCOSEU NEGATIVE 07/03/2008 1527   HGBUR SMALL (A) 09/03/2008 2133   BILIRUBINUR NEGATIVE 09/03/2008 2133   KETONESUR 15 (A) 09/03/2008 2133   PROTEINUR NEGATIVE 09/03/2008 2133   UROBILINOGEN 0.2 09/03/2008 2133   NITRITE NEGATIVE 09/03/2008 2133   LEUKOCYTESUR NEGATIVE 09/03/2008 2133   Sepsis Labs Invalid input(s): PROCALCITONIN,  WBC,  LACTICIDVEN Microbiology Recent Results (from the past 240  hour(s))  SARS CORONAVIRUS 2 (TAT 6-24 HRS) Nasopharyngeal Nasopharyngeal Swab     Status: None   Collection Time: 12/14/18 11:11 PM   Specimen: Nasopharyngeal Swab  Result Value Ref Range Status  SARS Coronavirus 2 NEGATIVE NEGATIVE Final    Comment: (NOTE) SARS-CoV-2 target nucleic acids are NOT DETECTED. The SARS-CoV-2 RNA is generally detectable in upper and lower respiratory specimens during the acute phase of infection. Negative results do not preclude SARS-CoV-2 infection, do not rule out co-infections with other pathogens, and should not be used as the sole basis for treatment or other patient management decisions. Negative results must be combined with clinical observations, patient history, and epidemiological information. The expected result is Negative. Fact Sheet for Patients: SugarRoll.be Fact Sheet for Healthcare Providers: https://www.woods-mathews.com/ This test is not yet approved or cleared by the [place] FDA and  has been authorized for detection and/or diagnosis of SARS-CoV-2 by FDA under an Emergency Use Authorization (EUA). This EUA will remain  in effect (meaning this test can be used) for the duration of the COVID-19 declaration under Section 56 4(b)(1) of the Act, 21 U.S.C. section 360bbb-3(b)(1), unless the authorization is terminated or revoked sooner. Performed at [facility], [place] [address], [place], [place] [address]     Procedures/Studies: Dg Chest 2 View  Result Date: 12/14/2018 CLINICAL DATA:  Pt reports that she had doctors appt today and had lab work done then called and told to go to ED for elevated lab. In chart reads troponin was in 80s. Hx NSTEMI. Denies pains. EXAM: CHEST - 2 VIEW COMPARISON:  09/22/2017 FINDINGS: Cardiac silhouette is normal in size. No mediastinal or hilar masses. No evidence of adenopathy. Lungs are hyperexpanded but clear. No pleural effusion or pneumothorax.  Skeletal structures are intact. IMPRESSION: No active cardiopulmonary disease. Electronically Signed   By: Lajean Manes M.D.   On: 12/14/2018 19:08   Vas US Renal Artery Duplex  Result Date: 12/17/2018 ABDOMINAL VISCERAL High Risk Factors: Hypertension. Comparison Study: no prior Performing Technologist: June Leap RDMS, RVT  Examination Guidelines: A complete evaluation includes B-mode imaging, spectral Doppler, color Doppler, and power Doppler as needed of all accessible portions of each vessel. Bilateral testing is considered an integral part of a complete examination. Limited examinations for reoccurring indications may be performed as noted.  Duplex Findings: +--------------------+--------+--------+------+--------+ Mesenteric          PSV cm/sEDV cm/sPlaqueComments +--------------------+--------+--------+------+--------+ Aorta at SMA           70      12                  +--------------------+--------+--------+------+--------+ Celiac Artery Origin  192      58                  +--------------------+--------+--------+------+--------+ SMA Proximal          170      27                  +--------------------+--------+--------+------+--------+    +------------------+--------+--------+-------+ Right Renal ArteryPSV cm/sEDV cm/sComment +------------------+--------+--------+-------+ Origin              209      74           +------------------+--------+--------+-------+ Proximal            250      92           +------------------+--------+--------+-------+ Mid                 155      62           +------------------+--------+--------+-------+ Distal  101      35           +------------------+--------+--------+-------+ +-----------------+--------+--------+-------+ Left Renal ArteryPSV cm/sEDV cm/sComment +-----------------+--------+--------+-------+ Origin             157      55           +-----------------+--------+--------+-------+  Proximal           131      36           +-----------------+--------+--------+-------+ Mid                113      47           +-----------------+--------+--------+-------+ Distal             148      47           +-----------------+--------+--------+-------+ +------------+--------+--------+----+-----------+--------+--------+----+ Right KidneyPSV cm/sEDV cm/sRI  Left KidneyPSV cm/sEDV cm/sRI   +------------+--------+--------+----+-----------+--------+--------+----+ Upper Pole  33      13      0.59Upper Pole 27      9       0.68 +------------+--------+--------+----+-----------+--------+--------+----+ Mid         26      12      0.53Mid        26      10      0.64 +------------+--------+--------+----+-----------+--------+--------+----+ Lower Pole  38      15      0.59Lower Pole 26      10      0.61 +------------+--------+--------+----+-----------+--------+--------+----+ Hilar       70      27      0.62Hilar      39      16      0.60 +------------+--------+--------+----+-----------+--------+--------+----+ +------------------+----+------------------+-----+ Right Kidney          Left Kidney             +------------------+----+------------------+-----+ RAR                   RAR                     +------------------+----+------------------+-----+ RAR (manual)      3.57RAR (manual)      2.24  +------------------+----+------------------+-----+ Cortex                Cortex                  +------------------+----+------------------+-----+ Cortex thickness      Corex thickness         +------------------+----+------------------+-----+ Kidney length (cm)9.64Kidney length (cm)10.05 +------------------+----+------------------+-----+  Summary: Renal:  Right: Evidence of a greater than 60% stenosis of the right renal        artery. Normal right Resisitive Index. Left:  No evidence of left renal artery stenosis. Normal left         Resistive Index.  *See table(s) above for measurements and observations.  Diagnosing physician: Ruta Hinds MD  Electronically signed by Ruta Hinds MD on 12/17/2018 at 11:07:12 AM.    Final     Time coordinating discharge: Over 30 minutes  SIGNED:   Guilford Shi, MD  [facility] 12/17/2018, 2:26 PM Pager : [phone]

## 2018-12-17 NOTE — Progress Notes (Signed)
Patients discharge information discussed with patient including all medications, prescriptions, and follow up instructions. All personal belongings including cell phone, charger, glasses, and clothing sent home with patient. Pt had no further questions.

## 2018-12-17 NOTE — Progress Notes (Signed)
Progress Note  Patient Name: Hayley Miller Date of Encounter: 12/17/2018  Primary Cardiologist: Dorris Carnes, MD   Subjective   No CP   Breathing is OK    Inpatient Medications    Scheduled Meds: . amLODipine  10 mg Oral Daily  . aspirin EC  81 mg Oral Daily  . carvedilol  25 mg Oral BID WC  . hydrALAZINE  100 mg Oral TID  . losartan  100 mg Oral Daily   And  . hydrochlorothiazide  25 mg Oral Daily  . loratadine  10 mg Oral Daily  . multivitamin with minerals  1 tablet Oral Daily  . pantoprazole  80 mg Oral Q1200  . pravastatin  40 mg Oral q1800  . spironolactone  25 mg Oral Daily   Continuous Infusions:  PRN Meds: acetaminophen, fluticasone, labetalol, ondansetron **OR** ondansetron (ZOFRAN) IV, tetrahydrozoline   Vital Signs    Vitals:   12/16/18 0824 12/16/18 1523 12/16/18 2028 12/17/18 0507  BP: (!) 152/104 (!) 132/103 (!) 146/99 (!) 143/83  Pulse: 87 96 92 86  Resp:  20 16 16   Temp:  98.1 F (36.7 C) 98.2 F (36.8 C) 98.7 F (37.1 C)  TempSrc:  Oral Oral Oral  SpO2:  96% 97% 94%  Weight:      Height:        Intake/Output Summary (Last 24 hours) at 12/17/2018 0809 Last data filed at 12/16/2018 1800 Gross per 24 hour  Intake 528.81 ml  Output -  Net 528.81 ml   Last 3 Weights 12/14/2018 12/14/2018 03/09/2018  Weight (lbs) 170 lb 171 lb 175 lb  Weight (kg) 77.111 kg 77.565 kg 79.379 kg  Some encounter information is confidential and restricted. Go to Review Flowsheets activity to see all data.      Telemetry    SR   - Personally Reviewed  ECG    Not done  - Personally Reviewed  Physical Exam   GEN: No acute distress.   Neck: No JVD Cardiac: RRR, no murmurs, rubs, or gallops.  Respiratory: Clear to auscultation bilaterally. GI: Soft, nontender, non-distended  MS: No edema; No deformity. Neuro:  Nonfocal  Psych: Normal affect   Labs    High Sensitivity Troponin:   Recent Labs  Lab 12/14/18 1900 12/14/18 2024  TROPONINIHS 69* 70*    Chemistry Recent Labs  Lab 12/14/18 1620 12/15/18 0752 12/16/18 0358  NA 141 142 139  K 4.1 3.2* 3.2*  CL 104 104 101  CO2 30 28 25   GLUCOSE 99 103* 100*  BUN 20 20 24*  CREATININE 1.36* 1.01* 1.14*  CALCIUM 10.8* 10.2 10.5*  PROT 7.7  --   --   ALBUMIN 4.4  --   --   AST 18  --   --   ALT 20  --   --   ALKPHOS 74  --   --   BILITOT 0.4  --   --   GFRNONAA  --  >60 54*  GFRAA  --  >60 >60  ANIONGAP  --  10 13     Hematology Recent Labs  Lab 12/15/18 0752 12/16/18 0358 12/17/18 0454  WBC 9.5 7.4 8.2  RBC 4.80 4.68 4.96  HGB 15.0 14.3 15.4*  HCT 46.3* 44.8 48.1*  MCV 96.5 95.7 97.0  MCH 31.3 30.6 31.0  MCHC 32.4 31.9 32.0  RDW 13.2 13.1 13.2  PLT 317 329 353    BNP Recent Labs  Lab 12/14/18 1620  PROBNP 61.0  DDimer No results for input(s): DDIMER in the last 168 hours.   Radiology    Vas US Renal Artery Duplex  Result Date: 12/16/2018 ABDOMINAL VISCERAL High Risk Factors: Hypertension. Comparison Study: no prior Performing Technologist: June Leap RDMS, RVT  Examination Guidelines: A complete evaluation includes B-mode imaging, spectral Doppler, color Doppler, and power Doppler as needed of all accessible portions of each vessel. Bilateral testing is considered an integral part of a complete examination. Limited examinations for reoccurring indications may be performed as noted.  Duplex Findings: +--------------------+--------+--------+------+--------+ Mesenteric          PSV cm/sEDV cm/sPlaqueComments +--------------------+--------+--------+------+--------+ Aorta at SMA           70      12                  +--------------------+--------+--------+------+--------+ Celiac Artery Origin  192      58                  +--------------------+--------+--------+------+--------+ SMA Proximal          170      27                  +--------------------+--------+--------+------+--------+    +------------------+--------+--------+-------+ Right  Renal ArteryPSV cm/sEDV cm/sComment +------------------+--------+--------+-------+ Origin              209      74           +------------------+--------+--------+-------+ Proximal            250      92           +------------------+--------+--------+-------+ Mid                 155      62           +------------------+--------+--------+-------+ Distal              101      35           +------------------+--------+--------+-------+ +-----------------+--------+--------+-------+ Left Renal ArteryPSV cm/sEDV cm/sComment +-----------------+--------+--------+-------+ Origin             157      55           +-----------------+--------+--------+-------+ Proximal           131      36           +-----------------+--------+--------+-------+ Mid                113      47           +-----------------+--------+--------+-------+ Distal             148      47           +-----------------+--------+--------+-------+ +------------+--------+--------+----+-----------+--------+--------+----+ Right KidneyPSV cm/sEDV cm/sRI  Left KidneyPSV cm/sEDV cm/sRI   +------------+--------+--------+----+-----------+--------+--------+----+ Upper Pole  33      13      0.59Upper Pole 27      9       0.68 +------------+--------+--------+----+-----------+--------+--------+----+ Mid         26      12      0.53Mid        26      10      0.64 +------------+--------+--------+----+-----------+--------+--------+----+ Lower Pole  38      15      0.59Lower Pole 26      10      0.61 +------------+--------+--------+----+-----------+--------+--------+----+  Hilar       70      27      0.62Hilar      39      16      0.60 +------------+--------+--------+----+-----------+--------+--------+----+ +------------------+----+------------------+-----+ Right Kidney          Left Kidney             +------------------+----+------------------+-----+ RAR                    RAR                     +------------------+----+------------------+-----+ RAR (manual)      3.57RAR (manual)      2.24  +------------------+----+------------------+-----+ Cortex                Cortex                  +------------------+----+------------------+-----+ Cortex thickness      Corex thickness         +------------------+----+------------------+-----+ Kidney length (cm)9.64Kidney length (cm)10.05 +------------------+----+------------------+-----+   Summary: Renal:  Right: Evidence of a greater than 60% stenosis of the right renal        artery. Normal right Resisitive Index. Left:  No evidence of left renal artery stenosis. Normal left        Resistive Index.  *See table(s) above for measurements and observations.     Preliminary     Cardiac Studies   LHC 07/19/17 Procedures  LEFT HEART CATH AND CORONARY ANGIOGRAPHY  Conclusion    The left ventricular systolic function is normal.  LV end diastolic pressure is normal.  The left ventricular ejection fraction is 50-55% by visual estimate.  1. Normal coronary anatomy 2. Overall normal LV function. Localized akinesis of the inferior apex 3. Normal LVEDP     Patient Profile    Aanyah Loa Mccroskey is a 58 y.o. female with a hx of poorly controlled HTN, prior anteroapical infarct w/ occluded distal LAD in 2006 that was treated w/ POBA with repeat LHC in 2019 showing normal coronaries, HLD, GERD and h/o GIB due to NSAID use in 2009, who is being seen today for the evaluation of chest pain, at the request of Dr. Louanne Belton, Internal Medicine.   Assessment & Plan    1. Chest pain  Pt denies   Most likely related to HTNsive urgency  2  HTN  BP improved on current regimen    Note renal artery USN showed  Greater than 60% narrowing on R renal artery   I will review with interventional service On current regimen BP is better controlled   I stressed with pt importance of compliance.    I will arrange for outpt f/u in  clinic to review and discuss future plans.    From a cardiac/HTN standpoint she is OK to go home .   For questions or updates, please contact [place] Please consult [email or web address] for contact info under        Signed, Dorris Carnes, MD  12/17/2018, 8:09 AM

## 2018-12-18 ENCOUNTER — Telehealth: Payer: Self-pay | Admitting: *Deleted

## 2018-12-18 NOTE — Telephone Encounter (Signed)
Pt was on TCM report tried calling pt to make hosp follow-up there was no answer, and not able to leave msg due to vm not set-up. Will retry later.Marland KitchenJohny Chess

## 2018-12-19 NOTE — Telephone Encounter (Signed)
Transition Care Management Follow-up Telephone Call   Date discharged? 12/17/18   How have you been since you were released from the hospital? Pt states she is doing fine   Do you understand why you were in the hospital? YES   Do you understand the discharge instructions? YES   Where were you discharged to? Home   Items Reviewed:  Medications reviewed: YES, no longer taking Metoprolol. She is now taking Losartan/HCTZ  Allergies reviewed: YES  Dietary changes reviewed: YES, heart healthy  Referrals reviewed: No referral recommended   Functional Questionnaire:   Activities of Daily Living (ADLs):   She states she are independent in the following: ambulation, bathing and hygiene, feeding, continence, grooming, toileting and dressing States she doesn't require assistance    Any transportation issues/concerns?: NO   Any patient concerns? NO   Confirmed importance and date/time of follow-up visits scheduled YES, appt 12/28/18  Provider Appointment booked with Dr. Sharlet Salina   Confirmed with patient if condition begins to worsen call PCP or go to the ER.  Patient was given the office number and encouraged to call back with question or concerns.  : YES

## 2018-12-20 ENCOUNTER — Telehealth: Payer: Self-pay | Admitting: Internal Medicine

## 2018-12-20 ENCOUNTER — Ambulatory Visit: Payer: Self-pay

## 2018-12-20 NOTE — Telephone Encounter (Signed)
New message   Patient states that she was just released from the hospital and she is having joint pain, constipation and fatigue. Please call the patient to discuss.

## 2018-12-20 NOTE — Telephone Encounter (Signed)
I spoke with pt. She complains of fatigue which is not new. Also has constipation and joint pain. This is new for pt. Pain is in back, knees and ankles.  I asked pt to follow up with primary care for these issues but pt does not want to do this as she feels issues are related to new medications that were prescribed during recent hospitalization.  States she did not have these issues before medications were changed.  During recent hospital stay coreg and aldactone were added and  Hydralazine was increased. Will forward to Dr. Harrington Challenger for review/recommendations.

## 2018-12-20 NOTE — Telephone Encounter (Signed)
Pt. Reports she feels like she is having side effects from medications Cardiology started her on - Hydralazine, Aldactone and Coreg. Having joint pain and back pain, constipation. Has called Dr. Harrington Challenger office as well this morning. Wants to know what Dr. Sharlet Salina thinks. Please advise pt.  Answer Assessment - Initial Assessment Questions 1.   NAME of MEDICATION: "What medicine are you calling about?"     Coreg, hydralazine and Aldactone 2.   QUESTION: "What is your question?"     Having joint pain, back pain and constipation 3.   PRESCRIBING HCP: "Who prescribed it?" Reason: if prescribed by specialist, call should be referred to that group.     Cardiology 4. SYMPTOMS: "Do you have any symptoms?"     Joint pain, back pain and constipation. 5. SEVERITY: If symptoms are present, ask "Are they mild, moderate or severe?"     In the morning it is severe 6.  PREGNANCY:  "Is there any chance that you are pregnant?" "When was your last menstrual period?"     No  Protocols used: MEDICATION QUESTION CALL-A-AH

## 2018-12-20 NOTE — Telephone Encounter (Signed)
None of these should cause joint pain. Can add fiber for constipation. Given her severe blood pressure and the high chance that uncontrolled blood pressure could hurt her heart I would recommend to continue for now until we see her back.

## 2018-12-20 NOTE — Telephone Encounter (Signed)
No answer and phone kept ringing. Will route to pec incase patient calls back, please give MD response below when patient calls.

## 2018-12-26 ENCOUNTER — Other Ambulatory Visit: Payer: Self-pay

## 2018-12-26 ENCOUNTER — Ambulatory Visit (AMBULATORY_SURGERY_CENTER): Payer: Self-pay | Admitting: *Deleted

## 2018-12-26 ENCOUNTER — Telehealth: Payer: Self-pay | Admitting: *Deleted

## 2018-12-26 ENCOUNTER — Encounter: Payer: Self-pay | Admitting: Gastroenterology

## 2018-12-26 VITALS — Temp 96.8°F | Ht 64.0 in | Wt 169.2 lb

## 2018-12-26 DIAGNOSIS — Z1211 Encounter for screening for malignant neoplasm of colon: Secondary | ICD-10-CM

## 2018-12-26 MED ORDER — NA SULFATE-K SULFATE-MG SULF 17.5-3.13-1.6 GM/177ML PO SOLN: 1.0000 | Freq: Once | ORAL | 0 refills | Status: AC

## 2018-12-26 NOTE — Progress Notes (Signed)

## 2018-12-26 NOTE — Telephone Encounter (Signed)
Hayley Miller,  I reviewed her hospital admission notes indicating the primary reason for her CP was significant hypertension.  Her PO meds were adjusted and she was discharged on 12/17/18.  You interviewed her today but I do not see any documented BP reading, was it obtained?  Thanks,  Osvaldo Angst

## 2018-12-26 NOTE — Telephone Encounter (Signed)
Hayley Miller-  Patient was admitted 12/14/18 for elevated blood pressure and elevate Troponin.  Per d/c note cardiology said elevated Troponin due to hypertension, echo in hospital normal, EF 50-55% no AVS. Cardiology stated atypical chest pain secondary to hypertension.  Patient is scheduled for screening colon 10/13 with Dr Fuller Plan.  Please review her chart and let us know if it's ok to proceed as scheduled. Thanks!

## 2018-12-27 NOTE — Telephone Encounter (Signed)
Robbin,  Not sure why BP's are omitted but in this particular case it would have provided a very necessary piece of information.  Stated in the initial inquiry, "Cardiology stated atypical chest pain secondary to hypertension"  This pt is cleared for anesthetic care at [facility].   Osvaldo Angst

## 2018-12-27 NOTE — Telephone Encounter (Signed)
John,   We do not take Blood Pressures here in [place]. Thanks! Robbin pv

## 2018-12-28 ENCOUNTER — Other Ambulatory Visit (INDEPENDENT_AMBULATORY_CARE_PROVIDER_SITE_OTHER): Payer: Medicare Other

## 2018-12-28 ENCOUNTER — Encounter: Payer: Self-pay | Admitting: Internal Medicine

## 2018-12-28 ENCOUNTER — Other Ambulatory Visit: Payer: Self-pay

## 2018-12-28 ENCOUNTER — Ambulatory Visit (INDEPENDENT_AMBULATORY_CARE_PROVIDER_SITE_OTHER): Payer: Medicare Other | Admitting: Internal Medicine

## 2018-12-28 VITALS — BP 110/80 | HR 89 | Temp 98.4°F | Ht 64.0 in | Wt 170.0 lb

## 2018-12-28 DIAGNOSIS — I160 Hypertensive urgency: Secondary | ICD-10-CM

## 2018-12-28 DIAGNOSIS — Z8679 Personal history of other diseases of the circulatory system: Secondary | ICD-10-CM

## 2018-12-28 DIAGNOSIS — M797 Fibromyalgia: Secondary | ICD-10-CM

## 2018-12-28 DIAGNOSIS — I701 Atherosclerosis of renal artery: Secondary | ICD-10-CM

## 2018-12-28 LAB — COMPREHENSIVE METABOLIC PANEL
ALT: 16 U/L (ref 0–35)
AST: 13 U/L (ref 0–37)
Albumin: 4.6 g/dL (ref 3.5–5.2)
Alkaline Phosphatase: 72 U/L (ref 39–117)
BUN: 43 mg/dL — ABNORMAL HIGH (ref 6–23)
CO2: 29 mEq/L (ref 19–32)
Calcium: 11.8 mg/dL — ABNORMAL HIGH (ref 8.4–10.5)
Chloride: 100 mEq/L (ref 96–112)
Creatinine, Ser: 1.79 mg/dL — ABNORMAL HIGH (ref 0.40–1.20)
GFR: 35.35 mL/min — ABNORMAL LOW (ref >60.00)
Glucose, Bld: 105 mg/dL — ABNORMAL HIGH (ref 70–99)
Potassium: 4.9 mEq/L (ref 3.5–5.1)
Sodium: 138 mEq/L (ref 135–145)
Total Bilirubin: 0.4 mg/dL (ref 0.2–1.2)
Total Protein: 8.3 g/dL (ref 6.0–8.3)

## 2018-12-28 LAB — CBC
HCT: 43.7 % (ref 36.0–46.0)
Hemoglobin: 14.8 g/dL (ref 12.0–15.0)
MCHC: 33.8 g/dL (ref 30.0–36.0)
MCV: 92.6 fl (ref 78.0–100.0)
Platelets: 479 10*3/uL — ABNORMAL HIGH (ref 150.0–400.0)
RBC: 4.72 Mil/uL (ref 3.87–5.11)
RDW: 13.1 % (ref 11.5–15.5)
WBC: 9.9 10*3/uL (ref 4.0–10.5)

## 2018-12-28 LAB — MAGNESIUM: Magnesium: 2.4 mg/dL (ref 1.5–2.5)

## 2018-12-28 MED ORDER — NITROGLYCERIN 0.4 MG SL SUBL: 0.4000 mg | SUBLINGUAL_TABLET | SUBLINGUAL | 0 refills | Status: DC | PRN

## 2018-12-28 NOTE — Patient Instructions (Signed)
We will give the medicines another 1-2 weeks to see if your body will get used to the lower blood pressure.   We are checking labs today.

## 2018-12-28 NOTE — Assessment & Plan Note (Signed)
BP is at goal now with medication compliance. She is currently taking coreg, losartan/hctz, amlodipine, hydralazine. She does have unilateral RAS which likely would not benefit from intervention. Checking CMP today and adjust as needed.

## 2018-12-28 NOTE — Assessment & Plan Note (Signed)
Suspect flare from stress in hospital. Should not take nsaids due to blood pressure. Can take tylenol.

## 2018-12-28 NOTE — Progress Notes (Signed)
   Subjective:   Patient ID: Hayley Miller, female    DOB: Sep 20, 1960, 58 y.o.   MRN: [id number]  HPI The patient is a 58 YO female coming in for hospital follow up (in office for severe hypertension and sent to ER due to elevated troponin, evaluated and started on her home regimen with normalization of blood pressure, they did make a couple of medication changes, ECHO done with intact function). She has been doing okay since getting home. Admits to taking medications as prescribed. She is having some fatigue and tiredness and achiness since starting medication. She did have the back pain since leaving hospital. Denies falls. She denies syncope. No headaches. Had a twinge of chest pain yesterday but did not last long. She did not take nitro.  PMH, [facility], social history reviewed and updated   Review of Systems  Constitutional: Positive for fatigue. Negative for activity change, appetite change, fever and unexpected weight change.  HENT: Negative.   Eyes: Negative.   Respiratory: Negative for cough, chest tightness and shortness of breath.   Cardiovascular: Negative for chest pain, palpitations and leg swelling.  Gastrointestinal: Negative for abdominal distention, abdominal pain, constipation, diarrhea, nausea and vomiting.  Musculoskeletal: Negative.   Skin: Negative.   Neurological: Positive for light-headedness.  Psychiatric/Behavioral: Negative.     Objective:  Physical Exam Constitutional:      Appearance: She is well-developed.  HENT:     Head: Normocephalic and atraumatic.  Neck:     Musculoskeletal: Normal range of motion.  Cardiovascular:     Rate and Rhythm: Normal rate and regular rhythm.  Pulmonary:     Effort: Pulmonary effort is normal. No respiratory distress.     Breath sounds: Normal breath sounds. No wheezing or rales.  Abdominal:     General: Bowel sounds are normal. There is no distension.     Palpations: Abdomen is soft.     Tenderness: There is no abdominal  tenderness. There is no rebound.  Skin:    General: Skin is warm and dry.  Neurological:     Mental Status: She is alert and oriented to person, place, and time.     Coordination: Coordination normal.     Vitals:   12/28/18 1528  BP: 110/80  Pulse: 89  Temp: 98.4 F (36.9 C)  TempSrc: Oral  SpO2: 96%  Weight: 170 lb (77.1 kg)  Height: 5\' 4"  (1.626 m)    Assessment & Plan:

## 2018-12-28 NOTE — Assessment & Plan Note (Signed)
Suspect intervention at this time would not be beneficial to patient as unilateral only and has uncontrolled hypertension for several years now at least. She is on ARB. Checking CMP today. BP is controlled on medications.

## 2018-12-28 NOTE — Telephone Encounter (Signed)
Thanks- Will proceed as scheduled

## 2018-12-29 ENCOUNTER — Other Ambulatory Visit: Payer: Self-pay

## 2018-12-29 ENCOUNTER — Ambulatory Visit
Admission: RE | Admit: 2018-12-29 | Discharge: 2018-12-29 | Disposition: A | Discharge status: Home / Self Care | Payer: Medicare Other | Source: Ambulatory Visit | Attending: Family | Admitting: Family

## 2018-12-29 ENCOUNTER — Telehealth: Payer: Self-pay

## 2018-12-29 DIAGNOSIS — R2232 Localized swelling, mass and lump, left upper limb: Secondary | ICD-10-CM

## 2018-12-29 DIAGNOSIS — R922 Inconclusive mammogram: Secondary | ICD-10-CM | POA: Diagnosis not present

## 2018-12-29 DIAGNOSIS — N6489 Other specified disorders of breast: Secondary | ICD-10-CM | POA: Diagnosis not present

## 2018-12-29 NOTE — Telephone Encounter (Signed)
Pt has appt 10/5/    NO changes for now.  Keep appt

## 2018-12-29 NOTE — Telephone Encounter (Signed)
Pt advised to see Dr. Gwenlyn Found 01/19/19 at [facility]. Pt to keep post hosp with V Bhagat PA 01/01/19.

## 2018-12-29 NOTE — Progress Notes (Signed)
Cardiology Office Note    Date:  01/01/2019   ID:  Hayley Miller, DOB [birth year], MRN [id number]  PCP:  Hoyt Koch, MD  Cardiologist: Dr. Harrington Challenger  Chief Complaint: Hospital follow up   History of Present Illness:   Hayley Miller is a 58 y.o. female with a hx of poorly controlled HTN, CAD HLD, GERD and h/o GIB due to NSAID use in 2009 seen for hospital follow up.   H/o prior anteroapical infarct w/ occluded distal LAD in 2006 that was treated w/ POBA. Last cath 06/2017 showed normal coronary anatomy and normal LVEDP. Symptoms felt due to elevated blood pressure.   Admitted 11/2018 with CP. Echo showed normal LVEFof 50-55% and impaired relaxation pattern of LV diastolic filling. Apical septal segment, apex, and mid anteroseptal segment are abnormal. Symptoms felt due to hypertensive urgency. Recommended compliance. Renal artery ultra sound showed greater than 60% narrowing on R renal artery.   Here today for follow up. She here for follow-up.  She does not have a blood pressure cuff at home.  She has been feeling fatigued and tired.  No energy and intermittently dizzy.  Denies recurrent chest pain.  No palpitation, orthopnea, PND, syncope, lower extremity edema or melena.  Past Medical History:  Diagnosis Date  . Allergy   . Anemia   . Anxiety   . CAD (coronary artery disease)    a. s/p AL MI in 2006 tx with PTCA to LAD;  b. LHC (10/2009): lum irregs in LAD, o/w no CAD, EF 55-60%;  c. Myoview (07/2011):  no ischemia, EF 63%;  d. Echo (11/2011):  mild LVH, EF 60-65%, Gr 1 DD;  e.  Lexiscan Myoview (04/2013):  Inferior bowel atten artifact with inferoapical defect; no ischemia; EF 58% (low risk)  . Cameron lesion, acute   . Chronic lower back pain   . Chronic pain syndrome   . Daily headache   . Depression   . Erosive esophagitis   . Fibromyalgia   . GERD (gastroesophageal reflux disease)   . Hemorrhage    upper gastrointestional. GI bleed 10/09 due to NSAID  . Hiatal  hernia   . HLD (hyperlipidemia)   . HTN (hypertension)   . Myocardial infarction ([place]) 2006 X 2  . Rhinitis   . Seasonal allergies     Past Surgical History:  Procedure Laterality Date  . BLADDER SUSPENSION N/A 05/08/2013   Procedure: TRANSVAGINAL TAPE (TVT) PROCEDURE;  Surgeon: Olga Millers, MD;  Location: [facility];  Service: Gynecology;  Laterality: N/A;  . BREAST CYST EXCISION Left 2002  . CARDIAC CATHETERIZATION  07/19/2017  . COLONOSCOPY    . CORONARY ANGIOPLASTY  2006  . CYSTOSCOPY N/A 05/08/2013   Procedure: CYSTOSCOPY;  Surgeon: Olga Millers, MD;  Location: [facility];  Service: Gynecology;  Laterality: N/A;  . LAPAROSCOPIC ASSISTED VAGINAL HYSTERECTOMY N/A 05/08/2013   Procedure: LAPAROSCOPIC ASSISTED VAGINAL HYSTERECTOMY;  Surgeon: Olga Millers, MD;  Location: [facility];  Service: Gynecology;  Laterality: N/A;  . LAPAROSCOPIC BILATERAL SALPINGO OOPHERECTOMY N/A 05/08/2013   Procedure: LAPAROSCOPIC BILATERAL SALPINGO OOPHORECTOMY;  Surgeon: Olga Millers, MD;  Location: [facility];  Service: Gynecology;  Laterality: N/A;  . LEFT HEART CATH AND CORONARY ANGIOGRAPHY N/A 07/19/2017   Procedure: LEFT HEART CATH AND CORONARY ANGIOGRAPHY;  Surgeon: Martinique, Peter M, MD;  Location: [facility];  Service: Cardiovascular;  Laterality: N/A;  . UPPER GASTROINTESTINAL ENDOSCOPY      Current Medications: Prior to Admission  medications   Medication Sig Start Date End Date Taking? Authorizing Provider  acetaminophen (TYLENOL) 325 MG tablet Take 650 mg by mouth every 6 (six) hours as needed for headache.    [provider]  amLODipine (NORVASC) 10 MG tablet Take 1 tablet (10 mg total) by mouth daily. 12/14/18   Hoyt Koch, MD  aspirin EC 81 MG tablet Take 81 mg by mouth daily.    [provider]  carvedilol (COREG) 25 MG tablet Take 1 tablet (25 mg total) by mouth 2 (two) times daily with a meal. 12/17/18   Guilford Shi, MD  cetirizine (ZYRTEC) 10 MG tablet  Take 1 tablet (10 mg total) by mouth daily. 12/08/18   Marrian Salvage, FNP  esomeprazole (NEXIUM) 40 MG capsule TAKE 1 CAPSULE AT 12:00 NOON EACH [place]. Patient taking differently: Take 40 mg by mouth daily at 12 noon.  12/08/18   Marrian Salvage, FNP  fluticasone (FLONASE) 50 MCG/ACT nasal spray Place 2 sprays into both nostrils daily as needed for allergies. 12/08/18   Marrian Salvage, FNP  hydrALAZINE (APRESOLINE) 25 MG tablet Take 4 tablets (100 mg total) by mouth 3 (three) times daily. 12/17/18   Guilford Shi, MD  losartan-hydrochlorothiazide (HYZAAR) 100-25 MG tablet Take 1 tablet by mouth daily. 12/14/18   Hoyt Koch, MD  Multiple Vitamin (MULTIVITAMIN WITH MINERALS) TABS tablet Take 1 tablet by mouth daily.    [provider]  nitroGLYCERIN (NITROSTAT) 0.4 MG SL tablet Place 1 tablet (0.4 mg total) under the tongue every 5 (five) minutes as needed for chest pain. 12/28/18   Hoyt Koch, MD  pravastatin (PRAVACHOL) 40 MG tablet TAKE (1) TABLET DAILY AT BEDTIME. Patient taking differently: Take 40 mg by mouth daily.  10/25/18   Hoyt Koch, MD  spironolactone (ALDACTONE) 25 MG tablet Take 1 tablet (25 mg total) by mouth daily. 12/18/18   Guilford Shi, MD  Tetrahydrozoline HCl (VISINE OP) Place 1 drop into both eyes daily as needed (dry eyes).    [provider]    Allergies:   Clonidine hydrochloride, Codeine, Ibuprofen, and Lovastatin   Social History   Socioeconomic History  . Marital status: Divorced    Spouse name: Not on file  . Number of children: 1  . Years of education: Not on file  . Highest education level: Not on file  Occupational History  . Occupation: unemployed    Fish farm manager: UNEMPLOYED  Social Needs  . Financial resource strain: Not on file  . Food insecurity    Worry: Not on file    Inability: Not on file  . Transportation needs    Medical: Not on file    Non-medical: Not on file  Tobacco  Use  . Smoking status: Former Smoker    Packs/[place]avarria: 1.00    Years: 3.00    Pack years: 3.00    Types: Cigarettes    Quit date: 04/17/2017    Years since quitting: 1.7  . Smokeless tobacco: Never Used  . Tobacco comment: Divorced, lives with female domestic partner  Substance and Sexual Activity  . Alcohol use: Not Currently    Comment: 07/20/2017 "might drink once or twice/year"  . Drug use: No  . Sexual activity: Yes    Birth control/protection: Other-see comments    Comment: has a female domestic partner  Lifestyle  . Physical activity    Days per week: Not on file    Minutes per session: Not on file  .  Stress: Not on file  Relationships  . Social Herbalist on phone: Not on file    Gets together: Not on file    Attends religious service: Not on file    Active member of club or organization: Not on file    Attends meetings of clubs or organizations: Not on file    Relationship status: Not on file  Other Topics Concern  . Not on file  Social History Narrative   Lives alone - divorced; 1 child.    Used to live with female domestic partner   Unemployed - disability.            Family History:  The patient's family history includes Colon cancer in her maternal aunt; Colon polyps in her mother; Diabetes in her maternal aunt; Heart disease in her maternal grandmother and son; Heart murmur in her mother; Hypertension in her mother.   ROS:   Please see the history of present illness.    ROS All other systems reviewed and are negative.   PHYSICAL EXAM:   VS:  BP 106/82   Pulse 86   Ht 5\' 4"  (1.626 m)   Wt 169 lb 12.8 oz (77 kg)   LMP 06/07/2012   SpO2 98%   BMI 29.15 kg/m    GEN: Well nourished, well developed, in no acute distress  HEENT: normal  Neck: no JVD, carotid bruits, or masses Cardiac: RRR; no murmurs, rubs, or gallops,no edema  Respiratory:  clear to auscultation bilaterally, normal work of breathing GI: soft, nontender, nondistended, + BS MS:  no deformity or atrophy  Skin: warm and dry, no rash Neuro:  Alert and Oriented x 3, Strength and sensation are intact Psych: euthymic mood, full affect  Wt Readings from Last 3 Encounters:  01/01/19 169 lb 12.8 oz (77 kg)  12/28/18 170 lb (77.1 kg)  12/26/18 169 lb 3.2 oz (76.7 kg)      Studies/Labs Reviewed:   EKG:  EKG is not ordered today.    Recent Labs: 12/14/2018: Pro B Natriuretic peptide (BNP) 61.0; TSH 2.04 12/28/2018: ALT 16; BUN 43; Creatinine, Ser 1.79; Hemoglobin 14.8; Magnesium 2.4; Platelets 479.0; Potassium 4.9; Sodium 138   Lipid Panel    Component Value Date/Time   CHOL 181 12/14/2018 1620   TRIG 196.0 (H) 12/14/2018 1620   TRIG 444 11/14/2009   HDL 40.10 12/14/2018 1620   CHOLHDL 5 12/14/2018 1620   VLDL 39.2 12/14/2018 1620   LDLCALC 101 (H) 12/14/2018 1620   LDLDIRECT 105.0 10/02/2015 1628    Additional studies/ records that were reviewed today include:   Echocardiogram: 11/2018 1. Left ventricular ejection fraction, by visual estimation, is 50 to 55%. The left ventricle has mildly decreased function. There is no left ventricular hypertrophy.  2. Apical septal segment, apex, and mid anteroseptal segment are abnormal.  3. Left ventricular diastolic Doppler parameters are consistent with impaired relaxation pattern of LV diastolic filling.  4. Global right ventricle has normal systolic function.The right ventricular size is normal. No increase in right ventricular wall thickness.  5. Left atrial size was normal.  6. Right atrial size was normal.  7. The mitral valve is normal in structure. Mild mitral valve regurgitation.  8. The tricuspid valve is normal in structure. Tricuspid valve regurgitation is trivial.  9. The aortic valve is normal in structure. Aortic valve regurgitation was not visualized by color flow Doppler. 10. The pulmonic valve was grossly normal. Pulmonic valve regurgitation is not visualized by  color flow Doppler. 11. Mildly elevated  pulmonary artery systolic pressure. 12. The inferior vena cava is normal in size with greater than 50% respiratory variability, suggesting right atrial pressure of 3 mmHg.  LEFT HEART CATH AND CORONARY ANGIOGRAPHY   06/2017  Conclusion    The left ventricular systolic function is normal.  LV end diastolic pressure is normal.  The left ventricular ejection fraction is 50-55% by visual estimate.   1. Normal coronary anatomy 2. Overall normal LV function. Localized akinesis of the inferior apex 3. Normal LVEDP  Plan: medical management including optimization of BP control.       ASSESSMENT & PLAN:    1. CAD s/p angioplasty to dLAD in 2006 - Last cath 06/2017 with normal coronary anatomy. No recurrent chest pain. Continue ASA, statin and BB.   2. HTN - BP now soft low with dizziness and fatigue. Reduce hydralazine to 75mg  TID. Continue Norvasc 10mg  qd, Coreg 25mg  BID, Hyzaar 100-25mg  qd and spironolactone 25mg  qd. She will purchase new BP cuff >> keep log of BP and call office next week.   3. HLD - Continue statin.   4. Renal artery stenosis - follow up with Dr. Gwenlyn Found later this month   Medication Adjustments/Labs and Tests Ordered: Current medicines are reviewed at length with the patient today.  Concerns regarding medicines are outlined above.  Medication changes, Labs and Tests ordered today are listed in the Patient Instructions below. Patient Instructions  Medication Instructions:  Your physician has recommended you make the following change in your medication:  1.  REDUCE the Hydralazine to 25 mg taking 3 tablets 3 times a [place]   If you need a refill on your cardiac medications before your next appointment, please call your pharmacy.   Lab work: None ordered  If you have labs (blood work) drawn today and your tests are completely normal, you will receive your results only by: [name] MyChart Message (if you have MyChart) OR . A paper copy in the mail If you have any  lab test that is abnormal or we need to change your treatment, we will call you to review the results.  Testing/Procedures: None ordered  Follow-Up: At [facility], you and your health needs are our priority.  As part of our continuing mission to provide you with exceptional heart care, we have created designated Provider Care Teams.  These Care Teams include your primary Cardiologist (physician) and Advanced Practice Providers (APPs -  Physician Assistants and Nurse Practitioners) who all work together to provide you with the care you need, when you need it. You will need a follow up appointment in:  3 months.  Please call our office 2 months in advance to schedule this appointment.  You may see Dorris Carnes, MD or one of the following Advanced Practice Providers on your designated Care Team: Richardson Dopp, PA-C [place], [place] . Daune Perch, NP  Any Other Special Instructions Will Be Listed Below (If Applicable).       Jarrett Soho, [place]  01/01/2019 3:24 PM    [facility] [address], [place], [place]  [address] Phone: [phone]; Fax: [phone]

## 2018-12-29 NOTE — Telephone Encounter (Signed)
-----   Message from Fay Records, MD sent at 12/28/2018 11:02 PM EDT ----- Please arrange for pt to be seen by Adora Fridge for HTN, evaluation of renal arteries She has appt Wed with B Bhagat   If Gwenlyn Found appt soon, can cancel ----- Message ----- From: Lorretta Harp, MD Sent: 12/27/2018   9:25 AM EDT To: Fay Records, MD  Happy to see her Nevin Bloodgood ----- Message ----- From: Fay Records, MD Sent: 12/26/2018   7:53 PM EDT To: Lorretta Harp, MD  Roderic Palau, Pt recently in [place] with CP   BP very high on admit   Renal USN as noted    Would like to refer her to you. Nevin Bloodgood

## 2019-01-01 ENCOUNTER — Encounter: Payer: Self-pay | Admitting: Physician Assistant

## 2019-01-01 ENCOUNTER — Other Ambulatory Visit: Payer: Self-pay

## 2019-01-01 ENCOUNTER — Other Ambulatory Visit: Payer: Self-pay | Admitting: Internal Medicine

## 2019-01-01 ENCOUNTER — Ambulatory Visit: Payer: Medicare Other | Admitting: Physician Assistant

## 2019-01-01 VITALS — BP 106/82 | HR 86 | Ht 64.0 in | Wt 169.8 lb

## 2019-01-01 DIAGNOSIS — I2510 Atherosclerotic heart disease of native coronary artery without angina pectoris: Secondary | ICD-10-CM | POA: Diagnosis not present

## 2019-01-01 DIAGNOSIS — E785 Hyperlipidemia, unspecified: Secondary | ICD-10-CM

## 2019-01-01 DIAGNOSIS — I10 Essential (primary) hypertension: Secondary | ICD-10-CM | POA: Diagnosis not present

## 2019-01-01 DIAGNOSIS — I701 Atherosclerosis of renal artery: Secondary | ICD-10-CM

## 2019-01-01 DIAGNOSIS — I160 Hypertensive urgency: Secondary | ICD-10-CM

## 2019-01-01 MED ORDER — HYDRALAZINE HCL 25 MG PO TABS: 75.0000 mg | ORAL_TABLET | Freq: Three times a day (TID) | ORAL | 3 refills | Status: DC

## 2019-01-01 MED ORDER — SPIRONOLACTONE 25 MG PO TABS: 25.0000 mg | ORAL_TABLET | Freq: Every day | ORAL | 3 refills | Status: DC

## 2019-01-01 MED ORDER — CARVEDILOL 25 MG PO TABS: 25.0000 mg | ORAL_TABLET | Freq: Two times a day (BID) | ORAL | 3 refills | Status: DC

## 2019-01-01 NOTE — Patient Instructions (Addendum)
Medication Instructions:  Your physician has recommended you make the following change in your medication:  1.  REDUCE the Hydralazine to 25 mg taking 3 tablets 3 times a [place]   If you need a refill on your cardiac medications before your next appointment, please call your pharmacy.   Lab work: None ordered  If you have labs (blood work) drawn today and your tests are completely normal, you will receive your results only by: [name] MyChart Message (if you have MyChart) OR . A paper copy in the mail If you have any lab test that is abnormal or we need to change your treatment, we will call you to review the results.  Testing/Procedures: None ordered  Follow-Up: At [facility], you and your health needs are our priority.  As part of our continuing mission to provide you with exceptional heart care, we have created designated Provider Care Teams.  These Care Teams include your primary Cardiologist (physician) and Advanced Practice Providers (APPs -  Physician Assistants and Nurse Practitioners) who all work together to provide you with the care you need, when you need it. You will need a follow up appointment in:  3 months.  Please call our office 2 months in advance to schedule this appointment.  You may see Dorris Carnes, MD or one of the following Advanced Practice Providers on your designated Care Team: Richardson Dopp, PA-C [place], [place] . Daune Perch, NP  Any Other Special Instructions Will Be Listed Below (If Applicable).

## 2019-01-09 ENCOUNTER — Encounter: Payer: Medicare Other | Admitting: Gastroenterology

## 2019-01-16 ENCOUNTER — Encounter (HOSPITAL_BASED_OUTPATIENT_CLINIC_OR_DEPARTMENT_OTHER): Payer: Self-pay | Admitting: *Deleted

## 2019-01-16 ENCOUNTER — Ambulatory Visit: Payer: Medicare Other | Admitting: Internal Medicine

## 2019-01-16 ENCOUNTER — Other Ambulatory Visit: Payer: Self-pay

## 2019-01-16 ENCOUNTER — Emergency Department (HOSPITAL_BASED_OUTPATIENT_CLINIC_OR_DEPARTMENT_OTHER)
Admission: EM | Admit: 2019-01-16 | Discharge: 2019-01-16 | Disposition: A | Discharge status: Home / Self Care | Payer: Medicare Other | Attending: Emergency Medicine | Admitting: Emergency Medicine

## 2019-01-16 ENCOUNTER — Ambulatory Visit: Payer: Self-pay | Admitting: Internal Medicine

## 2019-01-16 DIAGNOSIS — Z87891 Personal history of nicotine dependence: Secondary | ICD-10-CM | POA: Insufficient documentation

## 2019-01-16 DIAGNOSIS — I10 Essential (primary) hypertension: Secondary | ICD-10-CM | POA: Diagnosis not present

## 2019-01-16 DIAGNOSIS — I2510 Atherosclerotic heart disease of native coronary artery without angina pectoris: Secondary | ICD-10-CM | POA: Insufficient documentation

## 2019-01-16 DIAGNOSIS — R7989 Other specified abnormal findings of blood chemistry: Secondary | ICD-10-CM | POA: Diagnosis not present

## 2019-01-16 DIAGNOSIS — I252 Old myocardial infarction: Secondary | ICD-10-CM | POA: Diagnosis not present

## 2019-01-16 DIAGNOSIS — R531 Weakness: Secondary | ICD-10-CM | POA: Diagnosis not present

## 2019-01-16 DIAGNOSIS — R748 Abnormal levels of other serum enzymes: Secondary | ICD-10-CM | POA: Diagnosis not present

## 2019-01-16 LAB — CBC WITH DIFFERENTIAL/PLATELET
Abs Immature Granulocytes: 0.03 10*3/uL (ref 0.00–0.07)
Basophils Absolute: 0 10*3/uL (ref 0.0–0.1)
Basophils Relative: 1 %
Eosinophils Absolute: 0.2 10*3/uL (ref 0.0–0.5)
Eosinophils Relative: 3 %
HCT: 42 % (ref 36.0–46.0)
Hemoglobin: 13.7 g/dL (ref 12.0–15.0)
Immature Granulocytes: 0 %
Lymphocytes Relative: 29 %
Lymphs Abs: 2.1 10*3/uL (ref 0.7–4.0)
MCH: 30.4 pg (ref 26.0–34.0)
MCHC: 32.6 g/dL (ref 30.0–36.0)
MCV: 93.3 fL (ref 80.0–100.0)
Monocytes Absolute: 0.7 10*3/uL (ref 0.1–1.0)
Monocytes Relative: 10 %
Neutro Abs: 4.1 10*3/uL (ref 1.7–7.7)
Neutrophils Relative %: 57 %
Platelets: 344 10*3/uL (ref 150–400)
RBC: 4.5 MIL/uL (ref 3.87–5.11)
RDW: 12.5 % (ref 11.5–15.5)
WBC: 7.2 10*3/uL (ref 4.0–10.5)
nRBC: 0 % (ref 0.0–0.2)

## 2019-01-16 LAB — COMPREHENSIVE METABOLIC PANEL
ALT: 20 U/L (ref 0–44)
AST: 18 U/L (ref 15–41)
Albumin: 4.3 g/dL (ref 3.5–5.0)
Alkaline Phosphatase: 63 U/L (ref 38–126)
Anion gap: 11 (ref 5–15)
BUN: 39 mg/dL — ABNORMAL HIGH (ref 6–20)
CO2: 24 mmol/L (ref 22–32)
Calcium: 10.2 mg/dL (ref 8.9–10.3)
Chloride: 102 mmol/L (ref 98–111)
Creatinine, Ser: 2.05 mg/dL — ABNORMAL HIGH (ref 0.44–1.00)
GFR calc Af Amer: 31 mL/min — ABNORMAL LOW (ref >60)
GFR calc non Af Amer: 26 mL/min — ABNORMAL LOW (ref >60)
Glucose, Bld: 95 mg/dL (ref 70–99)
Potassium: 4 mmol/L (ref 3.5–5.1)
Sodium: 137 mmol/L (ref 135–145)
Total Bilirubin: 0.7 mg/dL (ref 0.3–1.2)
Total Protein: 8 g/dL (ref 6.5–8.1)

## 2019-01-16 LAB — TROPONIN I (HIGH SENSITIVITY)
Troponin I (High Sensitivity): 27 ng/L — ABNORMAL HIGH (ref <18)
Troponin I (High Sensitivity): 25 ng/L — ABNORMAL HIGH (ref <18)

## 2019-01-16 LAB — LACTIC ACID, PLASMA
Lactic Acid, Venous: 1 mmol/L (ref 0.5–1.9)
Lactic Acid, Venous: 1.1 mmol/L (ref 0.5–1.9)

## 2019-01-16 MED ORDER — HYDRALAZINE HCL 25 MG PO TABS: 25.0000 mg | ORAL_TABLET | Freq: Two times a day (BID) | ORAL | 0 refills | Status: DC

## 2019-01-16 MED ORDER — SODIUM CHLORIDE 0.9 % IV BOLUS
500.0000 mL | Freq: Once | INTRAVENOUS | Status: AC
  Administered 2019-01-16: 18:00:00 500 mL via INTRAVENOUS

## 2019-01-16 NOTE — ED Triage Notes (Signed)
Weakness. States she took her BP last night and it was low. She almost passed out last night. Hx of HTN now her BP is low per pt. BP 106/83 today.

## 2019-01-16 NOTE — Telephone Encounter (Signed)
Directed to go to the ED

## 2019-01-16 NOTE — Discharge Instructions (Signed)
He was seen today with generalized weakness and lightheadedness.  I have change the dosing of your blood pressure medication hydralazine.  Please take 25 mg twice a Reep with breakfast and at lunch.  Please keep your appointment with your doctor on Friday to evaluate the arteries going to your kidneys.  Your kidney function is slightly worse than earlier this month but not significantly.  This will need to be followed closely to make sure your kidneys are not getting more weak.  Please drink fluids.  Return to the emergency department any new or suddenly worsening symptoms.

## 2019-01-16 NOTE — ED Provider Notes (Signed)
Emergency Department Provider Note   I have reviewed the triage vital signs and the nursing notes.   HISTORY  Chief Complaint Hypotension   HPI Hayley Miller is a 58 y.o. female with PMH of CAD, HTN, and HLD recently admitted for HTN urgency and discharged on 9/20 with new HTN medication resents to the emergency department with continued generalized weakness and low blood pressures at home.  Patient states that she has been feeling generally weak since her discharge from the hospital late last month.  She followed with her cardiologist approximately 2 weeks ago who decreased her hydralazine from 75 mg 3 times daily to 25 mg 3 times daily.  She continues to be compliant with her medicines but continues to feel weakness.  She denies fever, chills, body aches.  No chest pain, heart palpitations, shortness of breath.  No back or abdominal pain.  No diarrhea or vomiting.  She has not seen blood in her bowel movements.  She has been tracking her blood pressures at home and last night was getting blood pressure readings in the high 70s and feeling lightheaded like she might pass out. No full syncope at home.   Past Medical History:  Diagnosis Date  . Allergy   . Anemia   . Anxiety   . CAD (coronary artery disease)    a. s/p AL MI in 2006 tx with PTCA to LAD;  b. LHC (10/2009): lum irregs in LAD, o/w no CAD, EF 55-60%;  c. Myoview (07/2011):  no ischemia, EF 63%;  d. Echo (11/2011):  mild LVH, EF 60-65%, Gr 1 DD;  e.  Lexiscan Myoview (04/2013):  Inferior bowel atten artifact with inferoapical defect; no ischemia; EF 58% (low risk)  . Cameron lesion, acute   . Chronic lower back pain   . Chronic pain syndrome   . Daily headache   . Depression   . Erosive esophagitis   . Fibromyalgia   . GERD (gastroesophageal reflux disease)   . Hemorrhage    upper gastrointestional. GI bleed 10/09 due to NSAID  . Hiatal hernia   . HLD (hyperlipidemia)   . HTN (hypertension)   . Myocardial infarction  ([place]) 2006 X 2  . Rhinitis   . Seasonal allergies     Patient Active Problem List   Diagnosis Date Noted  . Renal artery stenosis ([place]) 12/17/2018  . Headache 03/10/2018  . Sore throat 03/10/2018  . Hair thinning 12/01/2017  . Rash 12/01/2017  . Hypertensive urgency 07/19/2017  . Other forms of angina pectoris ([place]) 06/02/2016  . Syncope 03/11/2014  . Tinnitus 03/11/2014  . Fibromyalgia 04/18/2009  . NSTEMI (non-ST elevated myocardial infarction) ([place]) 04/15/2009  . LOW BACK PAIN 01/12/2008  . Depression 07/27/2007  . Hypercholesterolemia 06/27/2007  . Severe uncontrolled hypertension 06/27/2007  . CAD (coronary artery disease) 06/27/2007  . GERD 06/27/2007    Past Surgical History:  Procedure Laterality Date  . BLADDER SUSPENSION N/A 05/08/2013   Procedure: TRANSVAGINAL TAPE (TVT) PROCEDURE;  Surgeon: Olga Millers, MD;  Location: [facility];  Service: Gynecology;  Laterality: N/A;  . BREAST CYST EXCISION Left 2002  . CARDIAC CATHETERIZATION  07/19/2017  . COLONOSCOPY    . CORONARY ANGIOPLASTY  2006  . CYSTOSCOPY N/A 05/08/2013   Procedure: CYSTOSCOPY;  Surgeon: Olga Millers, MD;  Location: [facility];  Service: Gynecology;  Laterality: N/A;  . LAPAROSCOPIC ASSISTED VAGINAL HYSTERECTOMY N/A 05/08/2013   Procedure: LAPAROSCOPIC ASSISTED VAGINAL HYSTERECTOMY;  Surgeon: Olga Millers, MD;  Location: [facility];  Service: Gynecology;  Laterality: N/A;  . LAPAROSCOPIC BILATERAL SALPINGO OOPHERECTOMY N/A 05/08/2013   Procedure: LAPAROSCOPIC BILATERAL SALPINGO OOPHORECTOMY;  Surgeon: Olga Millers, MD;  Location: [facility];  Service: Gynecology;  Laterality: N/A;  . LEFT HEART CATH AND CORONARY ANGIOGRAPHY N/A 07/19/2017   Procedure: LEFT HEART CATH AND CORONARY ANGIOGRAPHY;  Surgeon: Martinique, Peter M, MD;  Location: [facility];  Service: Cardiovascular;  Laterality: N/A;  . UPPER GASTROINTESTINAL ENDOSCOPY      Allergies Clonidine hydrochloride, Codeine, Ibuprofen, and Lovastatin   Family History  Problem Relation Age of Onset  . Hypertension Mother   . Heart murmur Mother   . Colon polyps Mother   . Colon cancer Maternal Aunt   . Heart disease Son   . Heart disease Maternal Grandmother   . Diabetes Maternal Aunt   . Esophageal cancer Neg Hx   . Rectal cancer Neg Hx   . Stomach cancer Neg Hx   . Breast cancer Neg Hx     Social History Social History   Tobacco Use  . Smoking status: Former Smoker    Packs/Sow: 1.00    Years: 3.00    Pack years: 3.00    Types: Cigarettes    Quit date: 04/17/2017    Years since quitting: 1.7  . Smokeless tobacco: Never Used  . Tobacco comment: Divorced, lives with female domestic partner  Substance Use Topics  . Alcohol use: Not Currently    Comment: 07/20/2017 "might drink once or twice/year"  . Drug use: No    Review of Systems  Constitutional: No fever/chills. Positive generalized weakness.  Eyes: No visual changes. ENT: No sore throat. Cardiovascular: Denies chest pain. Respiratory: Denies shortness of breath. Gastrointestinal: No abdominal pain.  No nausea, no vomiting.  No diarrhea.  No constipation. Genitourinary: Negative for dysuria. Musculoskeletal: Negative for back pain. Skin: Negative for rash. Neurological: Negative for headaches, focal weakness or numbness.  10-point ROS otherwise negative.  ____________________________________________   PHYSICAL EXAM:  VITAL SIGNS: ED Triage Vitals  Enc Vitals Group     BP 01/16/19 1650 106/83     Pulse Rate 01/16/19 1650 88     Resp 01/16/19 1650 14     Temp 01/16/19 1650 98.9 F (37.2 C)     Temp Source 01/16/19 1650 Oral     SpO2 01/16/19 1650 98 %     Weight 01/16/19 1652 169 lb (76.7 kg)     Height 01/16/19 1652 5\' 4"  (1.626 m)   Constitutional: Alert and oriented. Well appearing and in no acute distress. Eyes: Conjunctivae are normal.  Head: Atraumatic. Nose: No congestion/rhinnorhea. Mouth/Throat: Mucous membranes are moist.  Neck: No  stridor.  Cardiovascular: Normal rate, regular rhythm. Good peripheral circulation. Grossly normal heart sounds.   Respiratory: Normal respiratory effort.  No retractions. Lungs CTAB. Gastrointestinal: Soft and nontender. No distention.  Musculoskeletal: No lower extremity tenderness nor edema. No gross deformities of extremities. Neurologic:  Normal speech and language. No gross focal neurologic deficits are appreciated.  Skin:  Skin is warm, dry and intact. No rash noted.  ____________________________________________   LABS (all labs ordered are listed, but only abnormal results are displayed)  Labs Reviewed  COMPREHENSIVE METABOLIC PANEL - Abnormal; Notable for the following components:      Result Value   BUN 39 (*)    Creatinine, Ser 2.05 (*)    GFR calc non Af Amer 26 (*)    GFR calc Af Amer 31 (*)  All other components within normal limits  TROPONIN I (HIGH SENSITIVITY) - Abnormal; Notable for the following components:   Troponin I (High Sensitivity) 27 (*)    All other components within normal limits  TROPONIN I (HIGH SENSITIVITY) - Abnormal; Notable for the following components:   Troponin I (High Sensitivity) 25 (*)    All other components within normal limits  LACTIC ACID, PLASMA  LACTIC ACID, PLASMA  CBC WITH DIFFERENTIAL/PLATELET   ____________________________________________  EKG   EKG Interpretation  Date/Time:  Tuesday January 16 2019 17:01:59 EDT Ventricular Rate:  87 PR Interval:    QRS Duration: 106 QT Interval:  344 QTC Calculation: 414 R Axis:   -65 Text Interpretation:  Sinus rhythm Left anterior fascicular block Low voltage, precordial leads Consider anterior infarct No STEMI. Similar to prior.  Confirmed by Nanda Quinton [phone]) on 01/16/2019 5:13:17 PM       ____________________________________________  RADIOLOGY  None ____________________________________________   PROCEDURES  Procedure(s) performed:   Procedures  None  ____________________________________________   INITIAL IMPRESSION / ASSESSMENT AND PLAN / ED COURSE  Pertinent labs & imaging results that were available during my care of the patient were reviewed by me and considered in my medical decision making (see chart for details).   Patient presents to the emergency department for evaluation of generalized weakness and near syncope last night.  Blood pressures especially at night have been low.  In looking back through the patient's blood pressure machine, which she has in the ED, she had 2 recorded blood pressures with systolic 34V last night.  Blood pressure improved through the morning and early afternoon.  She has been compliant with the medication change recommended by her cardiologist.  Plan for screening lab work.  EKG is unchanged here.  Will give gentle IV fluids and plan to cut back on her hydralazine to BID dosing if w/u here is normal.   Labs reviewed. Patient improved after IVF. No concern for cardiogenic shock, sepsis, PE. Suspect medication side effect. Will cut Hydralazine back to BID dosing. Continue to f/u with cardiology. Patient seeing specialist on Friday regarding renal artery stenosis and worsening creatinine. Interpreted labs here. CKD developing and up-trending slightly but no AKI noted. Discussed labs and f/u plan with patient.  ____________________________________________  FINAL CLINICAL IMPRESSION(S) / ED DIAGNOSES  Final diagnoses:  Weakness  Elevated serum creatinine     MEDICATIONS GIVEN DURING THIS VISIT:  Medications  sodium chloride 0.9 % bolus 500 mL (0 mLs Intravenous Stopped 01/16/19 1838)    Note:  This document was prepared using Dragon voice recognition software and may include unintentional dictation errors.  Nanda Quinton, MD, [facility] Emergency [facility]    Analicia Skibinski, [facility], MD 01/17/19 1325

## 2019-01-16 NOTE — Telephone Encounter (Signed)
Pt's mother calling, pt present during call, both speaking. Reports BP 75/58/ last night. Reports weakness, dizziness at that time. Mother reports pt "Weak as a dish rag and peed on herself."  Presently BP 121/81  HR 95.  States this AM 101/81  HR 91.  Denies dizziness, still with weakness. Pt's mother states "Can hardly get around." States is staying hydrated, denies any other symptoms, no recent illness. Pt and mother hesitant to continue triage, irritable affect. "I can just drive over there to the office. Instructed to not do so. Advised ED if pt not able to be seen today. Verbalizes understanding.  Call transferred to practice, Tammy, for consideration of appt, virtual appt. CB# [phone] Reason for Disposition . [9] Fall in systolic BP > 20 mm Hg from normal AND [2] NOT dizzy, lightheaded, or weak  Answer Assessment - Initial Assessment Questions 1. BLOOD PRESSURE: "What is the blood pressure?" "Did you take at least two measurements 5 minutes apart?"     Prior to call 101/81  HR 91 2. ONSET: "When did you take your blood pressure?"     "Few minutes ago" 3. HOW: "How did you obtain the blood pressure?" (e.g., visiting nurse, automatic home BP monitor)     Home monitor 4. HISTORY: "Do you have a history of low blood pressure?" "What is your blood pressure normally?"    No 5. MEDICATIONS: "Are you taking any medications for blood pressure?" If yes: "Have they been changed recently?"    Yes, no change 6. PULSE RATE: "Do you know what your pulse rate is?"      91 7. OTHER SYMPTOMS: "Have you been sick recently?" "Have you had a recent injury?"  Protocols used: LOW BLOOD PRESSURE-A-AH

## 2019-01-19 ENCOUNTER — Ambulatory Visit: Payer: Medicare Other | Admitting: Cardiovascular Disease

## 2019-02-06 ENCOUNTER — Other Ambulatory Visit: Payer: Self-pay

## 2019-02-06 ENCOUNTER — Ambulatory Visit: Payer: Medicare Other | Admitting: Cardiovascular Disease

## 2019-02-06 ENCOUNTER — Encounter: Payer: Self-pay | Admitting: Cardiovascular Disease

## 2019-02-06 VITALS — BP 162/120 | HR 99 | Temp 97.2°F | Ht 64.0 in | Wt 170.0 lb

## 2019-02-06 DIAGNOSIS — I10 Essential (primary) hypertension: Secondary | ICD-10-CM | POA: Diagnosis not present

## 2019-02-06 DIAGNOSIS — I701 Atherosclerosis of renal artery: Secondary | ICD-10-CM

## 2019-02-06 NOTE — Assessment & Plan Note (Signed)
Hayley Miller was referred to me by Dr. Harrington Challenger for evaluation of potential renal vascular hypertension.  Her current antihypertensive medicine regimen includes amlodipine, carvedilol, hydralazine, losartan, hydrochlorothiazide and spironolactone.  Her blood pressure today in the office is 160/120 although she says she has a blood pressure cuff at home revealing blood pressures of 120/80.  Her last several blood pressures in our offices are fairly low.  She does admit to dietary discretion with regards to salt.  Her serum creatinine has run in the 2 range recently.  Renal Doppler studies performed 12/16/2018 showed mild right renal artery stenosis with a systolic velocity of 461 and a right renal aortic ratio of 3.57.  I do not think that these velocities would support a stenosis that would contribute to renal vascular hypertension.  I suspect that her hypertension is multifactorial but somewhat driven by dietary indiscretion.  In addition, given her mild renal renal insufficiency I am not inclined to order a renal CTA or perform an invasive procedure.

## 2019-02-06 NOTE — Patient Instructions (Signed)
Medication Instructions:  Your physician recommends that you continue on your current medications as directed. Please refer to the Current Medication list given to you today.  If you need a refill on your cardiac medications before your next appointment, please call your pharmacy.   Lab work: NONE  Testing/Procedures: NONE  Follow-Up: At [facility], you and your health needs are our priority.  As part of our continuing mission to provide you with exceptional heart care, we have created designated Provider Care Teams.  These Care Teams include your primary Cardiologist (physician) and Advanced Practice Providers (APPs -  Physician Assistants and Nurse Practitioners) who all work together to provide you with the care you need, when you need it. You may see Dr Berry or one of the following Advanced Practice Providers on your designated Care Team:    Luke Kilroy, PA-C  Callie Goodrich, PA-C  Jesse Cleaver, FNP  Your physician wants you to follow-up in: 1 year. You will receive a reminder letter in the mail two months in advance. If you don't receive a letter, please call our office to schedule the follow-up appointment.      

## 2019-02-06 NOTE — Progress Notes (Signed)
02/06/2019 Hayley Miller   1960/05/20  [id number]  Primary Physician Hoyt Koch, MD Primary Cardiologist: Lorretta Harp MD Lupe Carney, [place]  HPI:  Hayley Miller is a 58 y.o. moderately overweight single African-American female mother of 1 son, grandmother of 1 grandchild referred by Dr. Harrington Challenger for peripheral vascular dilation because to rule out renal vascular hypertension.  She is currently disabled because of fibromyalgia, arthritis and other medical issues.  Her risk factors include tobacco abuse, treated hypertension and hyperlipidemia.  She is not diabetic.  She apparently had apical infarct in 2006 and had angioplasty for apical LAD.  Cardiac cath performed by Dr. Martinique 07/19/2017 showed normal coronary arteries and normal LV function.  She does admit to dietary indiscretion.  She is on multiple antihypertensive medications with serum creatinine in the 2 range.  Recent renal Doppler studies performed 12/16/2018 revealed mild to moderate right renal artery stenosis with a renal aortic ratio on the right of 3.57 suggesting approximately 50% renal artery stenosis on that side.   Current Meds  Medication Sig  . acetaminophen (TYLENOL) 325 MG tablet Take 650 mg by mouth every 6 (six) hours as needed for headache.  [name] amLODipine (NORVASC) 10 MG tablet Take 1 tablet (10 mg total) by mouth daily.  [name] aspirin EC 81 MG tablet Take 81 mg by mouth daily.  . carvedilol (COREG) 25 MG tablet Take 1 tablet (25 mg total) by mouth 2 (two) times daily with a meal.  . cetirizine (ZYRTEC) 10 MG tablet Take 1 tablet (10 mg total) by mouth daily.  [name] esomeprazole (NEXIUM) 40 MG capsule TAKE 1 CAPSULE AT 12:00 NOON EACH [place].  . fluticasone (FLONASE) 50 MCG/ACT nasal spray Place 2 sprays into both nostrils daily as needed for allergies.  . hydrALAZINE (APRESOLINE) 25 MG tablet Take 1 tablet (25 mg total) by mouth 2 (two) times daily with breakfast and lunch.  .  losartan-hydrochlorothiazide (HYZAAR) 100-25 MG tablet Take 1 tablet by mouth daily.  . Multiple Vitamin (MULTIVITAMIN WITH MINERALS) TABS tablet Take 1 tablet by mouth daily.  . nitroGLYCERIN (NITROSTAT) 0.4 MG SL tablet Place 1 tablet (0.4 mg total) under the tongue every 5 (five) minutes as needed for chest pain.  . pravastatin (PRAVACHOL) 40 MG tablet TAKE (1) TABLET DAILY AT BEDTIME.  [name] spironolactone (ALDACTONE) 25 MG tablet Take 1 tablet (25 mg total) by mouth daily.  . Tetrahydrozoline HCl (VISINE OP) Place 1 drop into both eyes daily as needed (dry eyes).     Allergies  Allergen Reactions  . Clonidine Hydrochloride Other (See Comments)    REACTION: FATIGUE  . Codeine Other (See Comments)    Unknown reaction (patient reports taking cough syrup with codeine)  . Ibuprofen Other (See Comments)    GI bleed  . Lovastatin Other (See Comments)    Join pain    Social History   Socioeconomic History  . Marital status: Divorced    Spouse name: Not on file  . Number of children: 1  . Years of education: Not on file  . Highest education level: Not on file  Occupational History  . Occupation: unemployed    Fish farm manager: UNEMPLOYED  Social Needs  . Financial resource strain: Not on file  . Food insecurity    Worry: Not on file    Inability: Not on file  . Transportation needs    Medical: Not on file    Non-medical: Not on file  Tobacco Use  .  Smoking status: Former Smoker    Packs/Muha: 1.00    Years: 3.00    Pack years: 3.00    Types: Cigarettes    Quit date: 04/17/2017    Years since quitting: 1.8  . Smokeless tobacco: Never Used  . Tobacco comment: Divorced, lives with female domestic partner  Substance and Sexual Activity  . Alcohol use: Not Currently    Comment: 07/20/2017 "might drink once or twice/year"  . Drug use: No  . Sexual activity: Yes    Birth control/protection: Other-see comments    Comment: has a female domestic partner  Lifestyle  . Physical activity     Days per week: Not on file    Minutes per session: Not on file  . Stress: Not on file  Relationships  . Social Herbalist on phone: Not on file    Gets together: Not on file    Attends religious service: Not on file    Active member of club or organization: Not on file    Attends meetings of clubs or organizations: Not on file    Relationship status: Not on file  . Intimate partner violence    Fear of current or ex partner: Not on file    Emotionally abused: Not on file    Physically abused: Not on file    Forced sexual activity: Not on file  Other Topics Concern  . Not on file  Social History Narrative   Lives alone - divorced; 1 child.    Used to live with female domestic partner   Unemployed - disability.            Review of Systems: General: negative for chills, fever, night sweats or weight changes.  Cardiovascular: negative for chest pain, dyspnea on exertion, edema, orthopnea, palpitations, paroxysmal nocturnal dyspnea or shortness of breath Dermatological: negative for rash Respiratory: negative for cough or wheezing Urologic: negative for hematuria Abdominal: negative for nausea, vomiting, diarrhea, bright red blood per rectum, melena, or hematemesis Neurologic: negative for visual changes, syncope, or dizziness All other systems reviewed and are otherwise negative except as noted above.    Blood pressure (!) 162/120, pulse 99, temperature (!) 97.2 F (36.2 C), height 5\' 4"  (1.626 m), weight 170 lb (77.1 kg), last menstrual period 06/07/2012.  General appearance: alert and no distress Neck: no adenopathy, no carotid bruit, no JVD, supple, symmetrical, trachea midline and thyroid not enlarged, symmetric, no tenderness/mass/nodules Lungs: clear to auscultation bilaterally Heart: regular rate and rhythm, S1, S2 normal, no murmur, click, rub or gallop Extremities: extremities normal, atraumatic, no cyanosis or edema Pulses: 2+ and symmetric Skin: Skin  color, texture, turgor normal. No rashes or lesions Neurologic: Alert and oriented X 3, normal strength and tone. Normal symmetric reflexes. Normal coordination and gait  EKG sinus rhythm at 99 with left anterior fascicular block, incomplete right bundle branch block, poor R wave progression and nonspecific ST and T wave changes.  I personally reviewed this EKG.  ASSESSMENT AND PLAN:   Renal artery stenosis (HCC) Ms. Billiter was referred to me by Dr. Harrington Challenger for evaluation of potential renal vascular hypertension.  Her current antihypertensive medicine regimen includes amlodipine, carvedilol, hydralazine, losartan, hydrochlorothiazide and spironolactone.  Her blood pressure today in the office is 160/120 although she says she has a blood pressure cuff at home revealing blood pressures of 120/80.  Her last several blood pressures in our offices are fairly low.  She does admit to dietary discretion with regards to  salt.  Her serum creatinine has run in the 2 range recently.  Renal Doppler studies performed 12/16/2018 showed mild right renal artery stenosis with a systolic velocity of 553 and a right renal aortic ratio of 3.57.  I do not think that these velocities would support a stenosis that would contribute to renal vascular hypertension.  I suspect that her hypertension is multifactorial but somewhat driven by dietary indiscretion.  In addition, given her mild renal renal insufficiency I am not inclined to order a renal CTA or perform an invasive procedure.      Lorretta Harp MD FACP,FACC,FAHA, [facility] 02/06/2019 12:18 PM

## 2019-03-08 ENCOUNTER — Other Ambulatory Visit: Payer: Self-pay | Admitting: Family

## 2019-03-16 ENCOUNTER — Encounter: Payer: Medicare Other | Admitting: Internal Medicine

## 2019-03-16 ENCOUNTER — Telehealth: Payer: Self-pay | Admitting: Internal Medicine

## 2019-03-16 NOTE — Telephone Encounter (Signed)
Fine with me

## 2019-03-16 NOTE — Telephone Encounter (Signed)
Patient has requested to transfer care from Dr Sharlet Salina to Dr Alain Marion. She states that this has already been approved but I did not see anything documented in the chart.  Dr Sharlet Salina, is this okay with you? Dr Alain Marion, is this okay with you?    Patient can be reached on her cell: [phone]

## 2019-03-19 ENCOUNTER — Encounter: Payer: Medicare Other | Admitting: Internal Medicine

## 2019-03-20 NOTE — Telephone Encounter (Signed)
Appointment scheduled to transfer care.

## 2019-03-20 NOTE — Telephone Encounter (Signed)
Ok w/me Thx 

## 2019-04-02 ENCOUNTER — Encounter: Payer: Self-pay | Admitting: Internal Medicine

## 2019-04-02 ENCOUNTER — Ambulatory Visit (INDEPENDENT_AMBULATORY_CARE_PROVIDER_SITE_OTHER): Payer: Medicare Other | Admitting: Internal Medicine

## 2019-04-02 ENCOUNTER — Other Ambulatory Visit: Payer: Self-pay

## 2019-04-02 VITALS — BP 142/96 | HR 99 | Temp 98.2°F | Ht 64.0 in | Wt 166.0 lb

## 2019-04-02 DIAGNOSIS — M2550 Pain in unspecified joint: Secondary | ICD-10-CM | POA: Insufficient documentation

## 2019-04-02 DIAGNOSIS — J449 Chronic obstructive pulmonary disease, unspecified: Secondary | ICD-10-CM | POA: Insufficient documentation

## 2019-04-02 DIAGNOSIS — R202 Paresthesia of skin: Secondary | ICD-10-CM

## 2019-04-02 DIAGNOSIS — E559 Vitamin D deficiency, unspecified: Secondary | ICD-10-CM

## 2019-04-02 DIAGNOSIS — M545 Low back pain: Secondary | ICD-10-CM | POA: Diagnosis not present

## 2019-04-02 DIAGNOSIS — M797 Fibromyalgia: Secondary | ICD-10-CM | POA: Diagnosis not present

## 2019-04-02 DIAGNOSIS — E785 Hyperlipidemia, unspecified: Secondary | ICD-10-CM | POA: Diagnosis not present

## 2019-04-02 DIAGNOSIS — G8929 Other chronic pain: Secondary | ICD-10-CM | POA: Diagnosis not present

## 2019-04-02 DIAGNOSIS — I10 Essential (primary) hypertension: Secondary | ICD-10-CM

## 2019-04-02 DIAGNOSIS — I701 Atherosclerosis of renal artery: Secondary | ICD-10-CM

## 2019-04-02 DIAGNOSIS — I25110 Atherosclerotic heart disease of native coronary artery with unstable angina pectoris: Secondary | ICD-10-CM

## 2019-04-02 DIAGNOSIS — F331 Major depressive disorder, recurrent, moderate: Secondary | ICD-10-CM

## 2019-04-02 DIAGNOSIS — J0111 Acute recurrent frontal sinusitis: Secondary | ICD-10-CM

## 2019-04-02 LAB — CBC WITH DIFFERENTIAL/PLATELET
Basophils Absolute: 0.1 10*3/uL (ref 0.0–0.1)
Basophils Relative: 0.7 % (ref 0.0–3.0)
Eosinophils Absolute: 0.1 10*3/uL (ref 0.0–0.7)
Eosinophils Relative: 1.5 % (ref 0.0–5.0)
HCT: 43.2 % (ref 36.0–46.0)
Hemoglobin: 14.3 g/dL (ref 12.0–15.0)
Lymphocytes Relative: 23.3 % (ref 12.0–46.0)
Lymphs Abs: 2.3 10*3/uL (ref 0.7–4.0)
MCHC: 33.2 g/dL (ref 30.0–36.0)
MCV: 95.4 fl (ref 78.0–100.0)
Monocytes Absolute: 0.7 10*3/uL (ref 0.1–1.0)
Monocytes Relative: 7.7 % (ref 3.0–12.0)
Neutro Abs: 6.5 10*3/uL (ref 1.4–7.7)
Neutrophils Relative %: 66.8 % (ref 43.0–77.0)
Platelets: 397 10*3/uL (ref 150.0–400.0)
RBC: 4.53 Mil/uL (ref 3.87–5.11)
RDW: 13.6 % (ref 11.5–15.5)
WBC: 9.7 10*3/uL (ref 4.0–10.5)

## 2019-04-02 LAB — BASIC METABOLIC PANEL
BUN: 22 mg/dL (ref 6–23)
CO2: 26 mEq/L (ref 19–32)
Calcium: 11.1 mg/dL — ABNORMAL HIGH (ref 8.4–10.5)
Chloride: 104 mEq/L (ref 96–112)
Creatinine, Ser: 1.26 mg/dL — ABNORMAL HIGH (ref 0.40–1.20)
GFR: 52.97 mL/min — ABNORMAL LOW (ref >60.00)
Glucose, Bld: 95 mg/dL (ref 70–99)
Potassium: 3.6 mEq/L (ref 3.5–5.1)
Sodium: 140 mEq/L (ref 135–145)

## 2019-04-02 LAB — LIPID PANEL
Cholesterol: 209 mg/dL — ABNORMAL HIGH (ref 0–200)
HDL: 40.7 mg/dL (ref >39.00)
NonHDL: 167.95
Total CHOL/HDL Ratio: 5
Triglycerides: 250 mg/dL — ABNORMAL HIGH (ref 0.0–149.0)
VLDL: 50 mg/dL — ABNORMAL HIGH (ref 0.0–40.0)

## 2019-04-02 LAB — URINALYSIS, ROUTINE W REFLEX MICROSCOPIC
Bilirubin Urine: NEGATIVE
Hgb urine dipstick: NEGATIVE
Ketones, ur: NEGATIVE
Nitrite: NEGATIVE
RBC / HPF: NONE SEEN (ref 0–?)
Specific Gravity, Urine: 1.025 (ref 1.000–1.030)
Total Protein, Urine: NEGATIVE
Urine Glucose: NEGATIVE
Urobilinogen, UA: 0.2 (ref 0.0–1.0)
pH: 5.5 (ref 5.0–8.0)

## 2019-04-02 LAB — URINALYSIS
Bilirubin Urine: NEGATIVE
Hgb urine dipstick: NEGATIVE
Ketones, ur: NEGATIVE
Nitrite: NEGATIVE
Specific Gravity, Urine: 1.025 (ref 1.000–1.030)
Total Protein, Urine: NEGATIVE
Urine Glucose: NEGATIVE
Urobilinogen, UA: 0.2 (ref 0.0–1.0)
pH: 5.5 (ref 5.0–8.0)

## 2019-04-02 LAB — HEPATIC FUNCTION PANEL
ALT: 13 U/L (ref 0–35)
AST: 15 U/L (ref 0–37)
Albumin: 4.7 g/dL (ref 3.5–5.2)
Alkaline Phosphatase: 77 U/L (ref 39–117)
Bilirubin, Direct: 0.1 mg/dL (ref 0.0–0.3)
Total Bilirubin: 0.3 mg/dL (ref 0.2–1.2)
Total Protein: 8.2 g/dL (ref 6.0–8.3)

## 2019-04-02 LAB — TSH: TSH: 2.18 u[IU]/mL (ref 0.35–4.50)

## 2019-04-02 LAB — CK: Total CK: 78 U/L (ref 7–177)

## 2019-04-02 LAB — URIC ACID: Uric Acid, Serum: 4.9 mg/dL (ref 2.4–7.0)

## 2019-04-02 LAB — VITAMIN B12: Vitamin B-12: 1256 pg/mL — ABNORMAL HIGH (ref 211–911)

## 2019-04-02 MED ORDER — CEFUROXIME AXETIL 250 MG PO TABS: 250.0000 mg | ORAL_TABLET | Freq: Two times a day (BID) | ORAL | 0 refills | Status: DC

## 2019-04-02 MED ORDER — METHYLPREDNISOLONE ACETATE 80 MG/ML IJ SUSP
80.0000 mg | Freq: Once | INTRAMUSCULAR | Status: AC
  Administered 2019-04-02: 80 mg via INTRAMUSCULAR

## 2019-04-02 MED ORDER — CYCLOBENZAPRINE HCL 5 MG PO TABS: 5.0000 mg | ORAL_TABLET | Freq: Every day | ORAL | 3 refills | Status: DC

## 2019-04-02 MED ORDER — METHYLPREDNISOLONE 4 MG PO TBPK: ORAL_TABLET | ORAL | 0 refills | Status: DC

## 2019-04-02 NOTE — Assessment & Plan Note (Signed)
Pravastatin  

## 2019-04-02 NOTE — Progress Notes (Signed)
Subjective:  Patient ID: Hayley Miller, female    DOB: March 17, 1961  Age: 59 y.o. MRN: [id number]  CC: No chief complaint on file.   HPI Amorette Charrette Midgett presents for a re-est visit C/o chronic LBP - severe C/o sinusitis sx's - green d/c F/u LBP, GERD, HTN, COPD Smoker 4 cigs ppd  Outpatient Medications Prior to Visit  Medication Sig Dispense Refill  . acetaminophen (TYLENOL) 325 MG tablet Take 650 mg by mouth every 6 (six) hours as needed for headache.    [name] amLODipine (NORVASC) 10 MG tablet Take 1 tablet (10 mg total) by mouth daily. 90 tablet 1  . aspirin EC 81 MG tablet Take 81 mg by mouth daily.    . carvedilol (COREG) 25 MG tablet Take 1 tablet (25 mg total) by mouth 2 (two) times daily with a meal. 180 tablet 3  . cetirizine (ZYRTEC) 10 MG tablet Take 1 tablet (10 mg total) by mouth daily. 30 tablet 0  . esomeprazole (NEXIUM) 40 MG capsule TAKE 1 CAPSULE AT 12:00 NOON EACH [place]. 90 capsule 0  . fluticasone (FLONASE) 50 MCG/ACT nasal spray Place 2 sprays into both nostrils daily as needed for allergies. 16 g 3  . losartan-hydrochlorothiazide (HYZAAR) 100-25 MG tablet Take 1 tablet by mouth daily. 90 tablet 3  . Multiple Vitamin (MULTIVITAMIN WITH MINERALS) TABS tablet Take 1 tablet by mouth daily.    . nitroGLYCERIN (NITROSTAT) 0.4 MG SL tablet Place 1 tablet (0.4 mg total) under the tongue every 5 (five) minutes as needed for chest pain. 5 tablet 0  . pravastatin (PRAVACHOL) 40 MG tablet TAKE (1) TABLET DAILY AT BEDTIME. 90 tablet 0  . spironolactone (ALDACTONE) 25 MG tablet Take 1 tablet (25 mg total) by mouth daily. 90 tablet 3  . Tetrahydrozoline HCl (VISINE OP) Place 1 drop into both eyes daily as needed (dry eyes).    . hydrALAZINE (APRESOLINE) 25 MG tablet Take 1 tablet (25 mg total) by mouth 2 (two) times daily with breakfast and lunch. 60 tablet 0   No facility-administered medications prior to visit.    ROS: Review of Systems  Constitutional: Positive for  fatigue. Negative for activity change, appetite change, chills and unexpected weight change.  HENT: Negative for congestion, mouth sores and sinus pressure.   Eyes: Negative for visual disturbance.  Respiratory: Positive for shortness of breath. Negative for cough and chest tightness.   Cardiovascular: Negative for chest pain and palpitations.  Gastrointestinal: Negative for abdominal pain and nausea.  Genitourinary: Negative for difficulty urinating, frequency and vaginal pain.  Musculoskeletal: Positive for arthralgias, back pain and myalgias. Negative for gait problem.  Skin: Negative for pallor and rash.  Neurological: Negative for dizziness, tremors, weakness, numbness and headaches.  Psychiatric/Behavioral: Positive for dysphoric mood. Negative for confusion, sleep disturbance and suicidal ideas. The patient is nervous/anxious.     Objective:  BP (!) 142/96 (BP Location: Left Arm, Patient Position: Sitting, Cuff Size: Normal)   Pulse 99   Temp 98.2 F (36.8 C) (Oral)   Ht 5\' 4"  (1.626 m)   Wt 166 lb (75.3 kg)   LMP 06/07/2012   SpO2 99%   BMI 28.49 kg/m   BP Readings from Last 3 Encounters:  04/02/19 (!) 142/96  02/06/19 (!) 162/120  01/16/19 112/79    Wt Readings from Last 3 Encounters:  04/02/19 166 lb (75.3 kg)  02/06/19 170 lb (77.1 kg)  01/16/19 169 lb (76.7 kg)    Physical Exam Constitutional:  General: She is not in acute distress.    Appearance: She is well-developed. She is obese.  HENT:     Head: Normocephalic.     Right Ear: External ear normal.     Left Ear: External ear normal.     Nose: Nose normal.  Eyes:     General:        Right eye: No discharge.        Left eye: No discharge.     Conjunctiva/sclera: Conjunctivae normal.     Pupils: Pupils are equal, round, and reactive to light.  Neck:     Thyroid: No thyromegaly.     Vascular: No JVD.     Trachea: No tracheal deviation.  Cardiovascular:     Rate and Rhythm: Normal rate and regular  rhythm.     Heart sounds: Normal heart sounds.  Pulmonary:     Effort: No respiratory distress.     Breath sounds: No stridor. No wheezing.  Abdominal:     General: Bowel sounds are normal. There is no distension.     Palpations: Abdomen is soft. There is no mass.     Tenderness: There is no abdominal tenderness. There is no guarding or rebound.  Musculoskeletal:        General: Tenderness present.     Cervical back: Normal range of motion and neck supple.  Lymphadenopathy:     Cervical: No cervical adenopathy.  Skin:    Findings: No erythema or rash.  Neurological:     Mental Status: She is oriented to person, place, and time.     Cranial Nerves: No cranial nerve deficit.     Motor: No abnormal muscle tone.     Coordination: Coordination normal.     Deep Tendon Reflexes: Reflexes normal.  Psychiatric:        Behavior: Behavior normal.        Thought Content: Thought content normal.        Judgment: Judgment normal.   LS painful w/ROM   FTF>45 min  Lab Results  Component Value Date   WBC 7.2 01/16/2019   HGB 13.7 01/16/2019   HCT 42.0 01/16/2019   PLT 344 01/16/2019   GLUCOSE 95 01/16/2019   CHOL 181 12/14/2018   TRIG 196.0 (H) 12/14/2018   HDL 40.10 12/14/2018   LDLDIRECT 105.0 10/02/2015   LDLCALC 101 (H) 12/14/2018   ALT 20 01/16/2019   AST 18 01/16/2019   NA 137 01/16/2019   K 4.0 01/16/2019   CL 102 01/16/2019   CREATININE 2.05 (H) 01/16/2019   BUN 39 (H) 01/16/2019   CO2 24 01/16/2019   TSH 2.04 12/14/2018   INR 1.0 12/14/2018   HGBA1C 5.4 12/14/2018    No results found.  Assessment & Plan:   There are no diagnoses linked to this encounter.   No orders of the defined types were placed in this encounter.    Follow-up: No follow-ups on file.  Walker Kehr, MD

## 2019-04-02 NOTE — Assessment & Plan Note (Signed)
F/u w/Dr Gwenlyn Found Renal US 11/2018:   Right: Evidence of a greater than 60% stenosis of the right renal        artery. Normal right Resisitive Index. Left:  No evidence of left renal artery stenosis. Normal left        Resistive Index.

## 2019-04-02 NOTE — Assessment & Plan Note (Signed)
?  statin related vs other Labs incl CK Vit D

## 2019-04-02 NOTE — Patient Instructions (Signed)

## 2019-04-02 NOTE — Assessment & Plan Note (Signed)
Medrol pack 

## 2019-04-02 NOTE — Assessment & Plan Note (Signed)
Vit D Flexeril at HS

## 2019-04-02 NOTE — Assessment & Plan Note (Addendum)
Severe Depo-medrol IM Medrol pack Ceftin po

## 2019-04-02 NOTE — Assessment & Plan Note (Signed)
Chronic Dr Harrington Challenger ASA, NTG prn, Pravachol BP meds

## 2019-04-02 NOTE — Assessment & Plan Note (Signed)
Chronic Dr Harrington Challenger BP meds reviewed

## 2019-04-03 LAB — LDL CHOLESTEROL, DIRECT: Direct LDL: 117 mg/dL

## 2019-04-03 LAB — VITAMIN D 25 HYDROXY (VIT D DEFICIENCY, FRACTURES): VITD: 37.17 ng/mL (ref 30.00–100.00)

## 2019-04-03 LAB — RHEUMATOID FACTOR: Rheumatoid fact SerPl-aCnc: <14 IU/mL (ref <14)

## 2019-04-30 ENCOUNTER — Ambulatory Visit (INDEPENDENT_AMBULATORY_CARE_PROVIDER_SITE_OTHER): Payer: Medicare Other | Admitting: Internal Medicine

## 2019-04-30 ENCOUNTER — Other Ambulatory Visit: Payer: Self-pay

## 2019-04-30 ENCOUNTER — Encounter: Payer: Self-pay | Admitting: Internal Medicine

## 2019-04-30 DIAGNOSIS — I25110 Atherosclerotic heart disease of native coronary artery with unstable angina pectoris: Secondary | ICD-10-CM | POA: Diagnosis not present

## 2019-04-30 DIAGNOSIS — I10 Essential (primary) hypertension: Secondary | ICD-10-CM

## 2019-04-30 DIAGNOSIS — G8929 Other chronic pain: Secondary | ICD-10-CM | POA: Diagnosis not present

## 2019-04-30 DIAGNOSIS — M545 Low back pain: Secondary | ICD-10-CM

## 2019-04-30 DIAGNOSIS — J0111 Acute recurrent frontal sinusitis: Secondary | ICD-10-CM

## 2019-04-30 MED ORDER — TRAMADOL HCL 50 MG PO TABS: 50.0000 mg | ORAL_TABLET | Freq: Four times a day (QID) | ORAL | 0 refills | Status: AC | PRN

## 2019-04-30 NOTE — Assessment & Plan Note (Signed)
Not better ENT ref

## 2019-04-30 NOTE — Assessment & Plan Note (Signed)
Pravachol BP is better

## 2019-04-30 NOTE — Progress Notes (Signed)
Subjective:  Patient ID: Hayley Miller, female    DOB: 1960-07-09  Age: 59 y.o. MRN: [id number]  CC: No chief complaint on file.   HPI Mylee Falin Gentile presents for LBP, CAD, HTN f/u C/o arthralgias Sinus sx's - not better  Outpatient Medications Prior to Visit  Medication Sig Dispense Refill  . acetaminophen (TYLENOL) 325 MG tablet Take 650 mg by mouth every 6 (six) hours as needed for headache.    [name] amLODipine (NORVASC) 10 MG tablet Take 1 tablet (10 mg total) by mouth daily. 90 tablet 1  . aspirin EC 81 MG tablet Take 81 mg by mouth daily.    . carvedilol (COREG) 25 MG tablet Take 1 tablet (25 mg total) by mouth 2 (two) times daily with a meal. 180 tablet 3  . cefUROXime (CEFTIN) 250 MG tablet Take 1 tablet (250 mg total) by mouth 2 (two) times daily. 20 tablet 0  . cetirizine (ZYRTEC) 10 MG tablet Take 1 tablet (10 mg total) by mouth daily. 30 tablet 0  . cyclobenzaprine (FLEXERIL) 5 MG tablet Take 1 tablet (5 mg total) by mouth at bedtime. 30 tablet 3  . esomeprazole (NEXIUM) 40 MG capsule TAKE 1 CAPSULE AT 12:00 NOON EACH [place]. 90 capsule 0  . fluticasone (FLONASE) 50 MCG/ACT nasal spray Place 2 sprays into both nostrils daily as needed for allergies. 16 g 3  . losartan-hydrochlorothiazide (HYZAAR) 100-25 MG tablet Take 1 tablet by mouth daily. 90 tablet 3  . methylPREDNISolone (MEDROL DOSEPAK) 4 MG TBPK tablet As directed 21 tablet 0  . Multiple Vitamin (MULTIVITAMIN WITH MINERALS) TABS tablet Take 1 tablet by mouth daily.    . nitroGLYCERIN (NITROSTAT) 0.4 MG SL tablet Place 1 tablet (0.4 mg total) under the tongue every 5 (five) minutes as needed for chest pain. 5 tablet 0  . pravastatin (PRAVACHOL) 40 MG tablet TAKE (1) TABLET DAILY AT BEDTIME. 90 tablet 0  . spironolactone (ALDACTONE) 25 MG tablet Take 1 tablet (25 mg total) by mouth daily. 90 tablet 3  . Tetrahydrozoline HCl (VISINE OP) Place 1 drop into both eyes daily as needed (dry eyes).    . hydrALAZINE  (APRESOLINE) 25 MG tablet Take 1 tablet (25 mg total) by mouth 2 (two) times daily with breakfast and lunch. 60 tablet 0   No facility-administered medications prior to visit.    ROS: Review of Systems  Constitutional: Negative for activity change, appetite change, chills, fatigue and unexpected weight change.  HENT: Negative for congestion, mouth sores and sinus pressure.   Eyes: Negative for visual disturbance.  Respiratory: Negative for cough and chest tightness.   Gastrointestinal: Negative for abdominal pain and nausea.  Genitourinary: Negative for difficulty urinating, frequency and vaginal pain.  Musculoskeletal: Positive for arthralgias, back pain and gait problem.  Skin: Negative for pallor and rash.  Neurological: Negative for dizziness, tremors, weakness, numbness and headaches.  Psychiatric/Behavioral: Negative for confusion and sleep disturbance.    Objective:  BP 118/72 (BP Location: Left Arm, Patient Position: Sitting, Cuff Size: Normal)   Pulse 81   Temp 98.2 F (36.8 C) (Oral)   Ht 5\' 4"  (1.626 m)   Wt 169 lb 4 oz (76.8 kg)   LMP 06/07/2012   SpO2 97%   BMI 29.05 kg/m   BP Readings from Last 3 Encounters:  04/30/19 118/72  04/02/19 (!) 142/96  02/06/19 (!) 162/120    Wt Readings from Last 3 Encounters:  04/30/19 169 lb 4 oz (76.8 kg)  04/02/19  166 lb (75.3 kg)  02/06/19 170 lb (77.1 kg)    Physical Exam Constitutional:      General: She is not in acute distress.    Appearance: She is well-developed.  HENT:     Head: Normocephalic.     Right Ear: External ear normal.     Left Ear: External ear normal.     Nose: Nose normal.  Eyes:     General:        Right eye: No discharge.        Left eye: No discharge.     Conjunctiva/sclera: Conjunctivae normal.     Pupils: Pupils are equal, round, and reactive to light.  Neck:     Thyroid: No thyromegaly.     Vascular: No JVD.     Trachea: No tracheal deviation.  Cardiovascular:     Rate and Rhythm:  Normal rate and regular rhythm.     Heart sounds: Normal heart sounds.  Pulmonary:     Effort: No respiratory distress.     Breath sounds: No stridor. No wheezing.  Abdominal:     General: Bowel sounds are normal. There is no distension.     Palpations: Abdomen is soft. There is no mass.     Tenderness: There is no abdominal tenderness. There is no guarding or rebound.  Musculoskeletal:        General: Tenderness present.     Cervical back: Normal range of motion and neck supple.  Lymphadenopathy:     Cervical: No cervical adenopathy.  Skin:    Findings: No erythema or rash.  Neurological:     Mental Status: She is oriented to person, place, and time.     Cranial Nerves: No cranial nerve deficit.     Motor: No abnormal muscle tone.     Coordination: Coordination normal.     Deep Tendon Reflexes: Reflexes normal.  Psychiatric:        Behavior: Behavior normal.        Thought Content: Thought content normal.        Judgment: Judgment normal.   LS spine is tender  Lab Results  Component Value Date   WBC 9.7 04/02/2019   HGB 14.3 04/02/2019   HCT 43.2 04/02/2019   PLT 397.0 04/02/2019   GLUCOSE 95 04/02/2019   CHOL 209 (H) 04/02/2019   TRIG 250.0 (H) 04/02/2019   HDL 40.70 04/02/2019   LDLDIRECT 117.0 04/02/2019   LDLCALC 101 (H) 12/14/2018   ALT 13 04/02/2019   AST 15 04/02/2019   NA 140 04/02/2019   K 3.6 04/02/2019   CL 104 04/02/2019   CREATININE 1.26 (H) 04/02/2019   BUN 22 04/02/2019   CO2 26 04/02/2019   TSH 2.18 04/02/2019   INR 1.0 12/14/2018   HGBA1C 5.4 12/14/2018    No results found.  Assessment & Plan:   Diagnoses and all orders for this visit:  Chronic bilateral low back pain without sciatica  Acute recurrent frontal sinusitis -     Ambulatory referral to ENT  Other orders -     traMADol (ULTRAM) 50 MG tablet; Take 1 tablet (50 mg total) by mouth every 6 (six) hours as needed for up to 5 days for severe pain.     Meds ordered this  encounter  Medications  . traMADol (ULTRAM) 50 MG tablet    Sig: Take 1 tablet (50 mg total) by mouth every 6 (six) hours as needed for up to 5 days for severe pain.  Dispense:  20 tablet    Refill:  0     Follow-up: No follow-ups on file.  Walker Kehr, MD

## 2019-04-30 NOTE — Assessment & Plan Note (Signed)
Much better 

## 2019-04-30 NOTE — Assessment & Plan Note (Signed)
Tramadol prn ° Potential benefits of a long term opioids use as well as potential risks (i.e. addiction risk, apnea etc) and complications (i.e. Somnolence, constipation and others) were explained to the patient and were aknowledged. ° ° °

## 2019-05-28 ENCOUNTER — Other Ambulatory Visit: Payer: Self-pay | Admitting: Internal Medicine

## 2019-05-29 ENCOUNTER — Other Ambulatory Visit: Payer: Self-pay | Admitting: Internal Medicine

## 2019-06-11 ENCOUNTER — Ambulatory Visit: Payer: Medicare Other | Admitting: Internal Medicine

## 2019-06-12 ENCOUNTER — Encounter: Payer: Self-pay | Admitting: Internal Medicine

## 2019-06-20 ENCOUNTER — Other Ambulatory Visit: Payer: Self-pay

## 2019-06-20 ENCOUNTER — Encounter: Payer: Self-pay | Admitting: Internal Medicine

## 2019-06-20 ENCOUNTER — Ambulatory Visit (INDEPENDENT_AMBULATORY_CARE_PROVIDER_SITE_OTHER): Payer: Medicare Other | Admitting: Internal Medicine

## 2019-06-20 DIAGNOSIS — G8929 Other chronic pain: Secondary | ICD-10-CM

## 2019-06-20 DIAGNOSIS — E785 Hyperlipidemia, unspecified: Secondary | ICD-10-CM | POA: Diagnosis not present

## 2019-06-20 DIAGNOSIS — I10 Essential (primary) hypertension: Secondary | ICD-10-CM

## 2019-06-20 DIAGNOSIS — M545 Low back pain: Secondary | ICD-10-CM

## 2019-06-20 MED ORDER — TRAMADOL HCL ER 200 MG PO TB24: 200.0000 mg | ORAL_TABLET | Freq: Every day | ORAL | 2 refills | Status: DC

## 2019-06-20 NOTE — Assessment & Plan Note (Signed)
Pravastatin  

## 2019-06-20 NOTE — Progress Notes (Signed)
Subjective:  Patient ID: Hayley Miller, female    DOB: 09/03/1960  Age: 59 y.o. MRN: [id number]  CC: No chief complaint on file.   HPI Hayley Miller presents for arthritic pains. Pain is 10 out of 10 w/o meds. Pain is 2/10 on Tramadol, would last x 3 hrs; taking 1 tid now... F/u HTN  Outpatient Medications Prior to Visit  Medication Sig Dispense Refill  . acetaminophen (TYLENOL) 325 MG tablet Take 650 mg by mouth every 6 (six) hours as needed for headache.    [name] amLODipine (NORVASC) 10 MG tablet Take 1 tablet (10 mg total) by mouth daily. 90 tablet 1  . aspirin EC 81 MG tablet Take 81 mg by mouth daily.    . carvedilol (COREG) 25 MG tablet Take 1 tablet (25 mg total) by mouth 2 (two) times daily with a meal. 180 tablet 3  . cefUROXime (CEFTIN) 250 MG tablet Take 1 tablet (250 mg total) by mouth 2 (two) times daily. 20 tablet 0  . cetirizine (ZYRTEC) 10 MG tablet Take 1 tablet (10 mg total) by mouth daily. 30 tablet 0  . cyclobenzaprine (FLEXERIL) 5 MG tablet Take 1 tablet (5 mg total) by mouth at bedtime. 30 tablet 3  . esomeprazole (NEXIUM) 40 MG capsule TAKE 1 CAPSULE AT 12:00 NOON EACH [place]. 90 capsule 3  . fluticasone (FLONASE) 50 MCG/ACT nasal spray Place 2 sprays into both nostrils daily as needed for allergies. 16 g 3  . losartan-hydrochlorothiazide (HYZAAR) 100-25 MG tablet Take 1 tablet by mouth daily. 90 tablet 3  . methylPREDNISolone (MEDROL DOSEPAK) 4 MG TBPK tablet As directed 21 tablet 0  . Multiple Vitamin (MULTIVITAMIN WITH MINERALS) TABS tablet Take 1 tablet by mouth daily.    . nitroGLYCERIN (NITROSTAT) 0.4 MG SL tablet Place 1 tablet (0.4 mg total) under the tongue every 5 (five) minutes as needed for chest pain. 5 tablet 0  . pravastatin (PRAVACHOL) 40 MG tablet TAKE (1) TABLET DAILY AT BEDTIME. 90 tablet 0  . spironolactone (ALDACTONE) 25 MG tablet Take 1 tablet (25 mg total) by mouth daily. 90 tablet 3  . Tetrahydrozoline HCl (VISINE OP) Place 1 drop into  both eyes daily as needed (dry eyes).    . traMADol (ULTRAM) 50 MG tablet TAKE 1 TABLEST BY MOUTH EVER 6 HOURS AS NEEDED FOR UP TO 5 DAYS FOR SEVERE PAIN. 60 tablet 1  . hydrALAZINE (APRESOLINE) 25 MG tablet Take 1 tablet (25 mg total) by mouth 2 (two) times daily with breakfast and lunch. 60 tablet 0   No facility-administered medications prior to visit.    ROS: Review of Systems  Constitutional: Negative for activity change, appetite change, chills, fatigue and unexpected weight change.  HENT: Negative for congestion, mouth sores and sinus pressure.   Eyes: Negative for visual disturbance.  Respiratory: Negative for cough and chest tightness.   Gastrointestinal: Negative for abdominal pain and nausea.  Genitourinary: Negative for difficulty urinating, frequency and vaginal pain.  Musculoskeletal: Positive for arthralgias, back pain and gait problem.  Skin: Negative for pallor and rash.  Neurological: Negative for dizziness, tremors, weakness, numbness and headaches.  Psychiatric/Behavioral: Negative for confusion, sleep disturbance and suicidal ideas.    Objective:  BP 122/84 (BP Location: Left Arm, Patient Position: Sitting, Cuff Size: Normal)   Pulse 85   Temp 98.2 F (36.8 C) (Oral)   Ht 5\' 4"  (1.626 m)   Wt 166 lb 8 oz (75.5 kg)   LMP 06/07/2012  SpO2 97%   BMI 28.58 kg/m   BP Readings from Last 3 Encounters:  06/20/19 122/84  04/30/19 118/72  04/02/19 (!) 142/96    Wt Readings from Last 3 Encounters:  06/20/19 166 lb 8 oz (75.5 kg)  04/30/19 169 lb 4 oz (76.8 kg)  04/02/19 166 lb (75.3 kg)    Physical Exam Constitutional:      General: She is not in acute distress.    Appearance: She is well-developed.  HENT:     Head: Normocephalic.     Right Ear: External ear normal.     Left Ear: External ear normal.     Nose: Nose normal.  Eyes:     General:        Right eye: No discharge.        Left eye: No discharge.     Conjunctiva/sclera: Conjunctivae normal.      Pupils: Pupils are equal, round, and reactive to light.  Neck:     Thyroid: No thyromegaly.     Vascular: No JVD.     Trachea: No tracheal deviation.  Cardiovascular:     Rate and Rhythm: Normal rate and regular rhythm.     Heart sounds: Normal heart sounds.  Pulmonary:     Effort: No respiratory distress.     Breath sounds: No stridor. No wheezing.  Abdominal:     General: Bowel sounds are normal. There is no distension.     Palpations: Abdomen is soft. There is no mass.     Tenderness: There is no abdominal tenderness. There is no guarding or rebound.  Musculoskeletal:        General: No tenderness.     Cervical back: Normal range of motion and neck supple.  Lymphadenopathy:     Cervical: No cervical adenopathy.  Skin:    Findings: No erythema or rash.  Neurological:     Cranial Nerves: No cranial nerve deficit.     Motor: No abnormal muscle tone.     Coordination: Coordination normal.     Deep Tendon Reflexes: Reflexes normal.  Psychiatric:        Behavior: Behavior normal.        Thought Content: Thought content normal.        Judgment: Judgment normal.     Lab Results  Component Value Date   WBC 9.7 04/02/2019   HGB 14.3 04/02/2019   HCT 43.2 04/02/2019   PLT 397.0 04/02/2019   GLUCOSE 95 04/02/2019   CHOL 209 (H) 04/02/2019   TRIG 250.0 (H) 04/02/2019   HDL 40.70 04/02/2019   LDLDIRECT 117.0 04/02/2019   LDLCALC 101 (H) 12/14/2018   ALT 13 04/02/2019   AST 15 04/02/2019   NA 140 04/02/2019   K 3.6 04/02/2019   CL 104 04/02/2019   CREATININE 1.26 (H) 04/02/2019   BUN 22 04/02/2019   CO2 26 04/02/2019   TSH 2.18 04/02/2019   INR 1.0 12/14/2018   HGBA1C 5.4 12/14/2018    No results found.  Assessment & Plan:    Walker Kehr, MD

## 2019-06-20 NOTE — Assessment & Plan Note (Signed)
Tramadol prn - d/c due to short effect. Try Tramadol ER 200 mg/d  Potential benefits of a long term opioids use as well as potential risks (i.e. addiction risk, apnea etc) and complications (i.e. Somnolence, constipation and others) were explained to the patient and were aknowledged.

## 2019-06-20 NOTE — Assessment & Plan Note (Signed)
BP Readings from Last 3 Encounters:  06/20/19 122/84  04/30/19 118/72  04/02/19 (!) 142/96

## 2019-08-26 ENCOUNTER — Other Ambulatory Visit: Payer: Self-pay | Admitting: Physician Assistant

## 2019-08-26 ENCOUNTER — Other Ambulatory Visit: Payer: Self-pay | Admitting: Internal Medicine

## 2019-08-28 ENCOUNTER — Other Ambulatory Visit: Payer: Self-pay | Admitting: Internal Medicine

## 2019-08-29 ENCOUNTER — Other Ambulatory Visit: Payer: Self-pay | Admitting: Internal Medicine

## 2019-10-19 ENCOUNTER — Other Ambulatory Visit: Payer: Self-pay | Admitting: Internal Medicine

## 2019-10-22 NOTE — Telephone Encounter (Signed)
[place] Controlled Database Checked Last filled: 08/26/2019 (30) LOV w/you: 06/20/2019 Next appt w/you: none

## 2019-12-18 ENCOUNTER — Other Ambulatory Visit: Payer: Self-pay | Admitting: Internal Medicine

## 2019-12-26 ENCOUNTER — Other Ambulatory Visit: Payer: Self-pay | Admitting: Internal Medicine

## 2020-01-04 ENCOUNTER — Ambulatory Visit: Payer: Medicare Other | Admitting: Family

## 2020-01-07 ENCOUNTER — Other Ambulatory Visit: Payer: Self-pay | Admitting: Internal Medicine

## 2020-01-07 NOTE — Telephone Encounter (Signed)
Done erx 

## 2020-01-07 NOTE — Telephone Encounter (Signed)
Check [place] registry last filled 12/19/2019. MD is out of the office until 10/22. Pls advise.Marland KitchenJohny Miller

## 2020-01-24 ENCOUNTER — Other Ambulatory Visit: Payer: Self-pay

## 2020-01-24 ENCOUNTER — Encounter: Payer: Self-pay | Admitting: Internal Medicine

## 2020-01-24 ENCOUNTER — Ambulatory Visit (INDEPENDENT_AMBULATORY_CARE_PROVIDER_SITE_OTHER): Payer: Medicare Other | Admitting: Internal Medicine

## 2020-01-24 DIAGNOSIS — G8929 Other chronic pain: Secondary | ICD-10-CM

## 2020-01-24 DIAGNOSIS — M545 Low back pain, unspecified: Secondary | ICD-10-CM

## 2020-01-24 DIAGNOSIS — I10 Essential (primary) hypertension: Secondary | ICD-10-CM | POA: Diagnosis not present

## 2020-01-24 DIAGNOSIS — J0111 Acute recurrent frontal sinusitis: Secondary | ICD-10-CM

## 2020-01-24 DIAGNOSIS — I25110 Atherosclerotic heart disease of native coronary artery with unstable angina pectoris: Secondary | ICD-10-CM

## 2020-01-24 DIAGNOSIS — J449 Chronic obstructive pulmonary disease, unspecified: Secondary | ICD-10-CM

## 2020-01-24 DIAGNOSIS — G4489 Other headache syndrome: Secondary | ICD-10-CM

## 2020-01-24 MED ORDER — TRAMADOL HCL ER 200 MG PO TB24
200.0000 mg | ORAL_TABLET | Freq: Every morning | ORAL | 2 refills | Status: DC
Start: 2020-01-24 — End: 2020-04-30

## 2020-01-24 MED ORDER — TRAMADOL HCL ER 100 MG PO TB24: 100.0000 mg | ORAL_TABLET | Freq: Every evening | ORAL | 2 refills | Status: DC | PRN

## 2020-01-24 MED ORDER — METHYLPREDNISOLONE ACETATE 80 MG/ML IJ SUSP
80.0000 mg | Freq: Once | INTRAMUSCULAR | Status: AC
  Administered 2020-01-24: 80 mg via INTRAMUSCULAR

## 2020-01-24 MED ORDER — TRAMADOL HCL ER 200 MG PO TB24
200.0000 mg | ORAL_TABLET | Freq: Every morning | ORAL | 2 refills | Status: DC
Start: 2020-01-24 — End: 2020-01-24

## 2020-01-24 MED ORDER — CEFUROXIME AXETIL 250 MG PO TABS
250.0000 mg | ORAL_TABLET | Freq: Two times a day (BID) | ORAL | 0 refills | Status: DC
Start: 2020-01-24 — End: 2020-04-30

## 2020-01-24 NOTE — Progress Notes (Signed)
Subjective:  Patient ID: Hayley Miller, female    DOB: [birth year]  Age: 59 y.o. MRN: [id number]  CC: Abdominal Pain and Back Pain   HPI Hayley Miller presents for sinusitis sx's C/o pains - LBP, leg pain, stiffness; pt had to quit her job F/u CAD, COPD  Pt is refusing COVID 19 vaccination. Advised to get a JJ vaccine ASAP    Outpatient Medications Prior to Visit  Medication Sig Dispense Refill  . acetaminophen (TYLENOL) 325 MG tablet Take 650 mg by mouth every 6 (six) hours as needed for headache.    [name] amLODipine (NORVASC) 10 MG tablet TAKE 1 TABLET ONCE DAILY. 90 tablet 1  . aspirin EC 81 MG tablet Take 81 mg by mouth daily.    . carvedilol (COREG) 25 MG tablet Take 1 tablet (25 mg total) by mouth 2 (two) times daily with a meal. 180 tablet 3  . cefUROXime (CEFTIN) 250 MG tablet Take 1 tablet (250 mg total) by mouth 2 (two) times daily. 20 tablet 0  . cetirizine (ZYRTEC) 10 MG tablet Take 1 tablet (10 mg total) by mouth daily. 30 tablet 0  . cyclobenzaprine (FLEXERIL) 5 MG tablet TAKE ONE TABLET AT BEDTIME. 30 tablet 0  . esomeprazole (NEXIUM) 40 MG capsule TAKE 1 CAPSULE AT 12:00 NOON EACH [place]. 90 capsule 3  . fluticasone (FLONASE) 50 MCG/ACT nasal spray USE 2 SPRAYS EACH NOSTRIL ONCE A Routzahn AS NEEDED FOR ALLERGIES OR CONGESTION. 16 g 0  . hydrALAZINE (APRESOLINE) 25 MG tablet TAKE 4 TABLETS 3 TIMES A Holzmann. 270 tablet 3  . losartan-hydrochlorothiazide (HYZAAR) 100-25 MG tablet Take 1 tablet by mouth daily. 90 tablet 3  . methylPREDNISolone (MEDROL DOSEPAK) 4 MG TBPK tablet As directed 21 tablet 0  . Multiple Vitamin (MULTIVITAMIN WITH MINERALS) TABS tablet Take 1 tablet by mouth daily.    . nitroGLYCERIN (NITROSTAT) 0.4 MG SL tablet Place 1 tablet (0.4 mg total) under the tongue every 5 (five) minutes as needed for chest pain. 5 tablet 0  . pravastatin (PRAVACHOL) 40 MG tablet TAKE (1) TABLET DAILY AT BEDTIME. 90 tablet 3  . spironolactone (ALDACTONE) 25 MG tablet Take 1  tablet (25 mg total) by mouth daily. 90 tablet 3  . Tetrahydrozoline HCl (VISINE OP) Place 1 drop into both eyes daily as needed (dry eyes).    . traMADol (ULTRAM-ER) 200 MG 24 hr tablet Take 1 tablet (200 mg total) by mouth daily. 30 tablet 2  . TraMADol HCl 200 MG TB24 TAKE 1 TABLET ONCE DAILY. 30 tablet 0   No facility-administered medications prior to visit.    ROS: Review of Systems  Constitutional: Negative for activity change, appetite change, chills, fatigue and unexpected weight change.  HENT: Positive for congestion and sinus pain. Negative for mouth sores and sinus pressure.   Eyes: Negative for visual disturbance.  Respiratory: Negative for cough and chest tightness.   Gastrointestinal: Negative for abdominal pain and nausea.  Genitourinary: Negative for difficulty urinating, frequency and vaginal pain.  Musculoskeletal: Positive for arthralgias and back pain. Negative for gait problem.  Skin: Negative for pallor and rash.  Neurological: Positive for headaches. Negative for dizziness, tremors, weakness and numbness.  Psychiatric/Behavioral: Negative for confusion and sleep disturbance.    Objective:  BP 118/84 (BP Location: Left Arm)   Pulse 86   Temp 98.5 F (36.9 C) (Oral)   Wt 150 lb (68 kg)   LMP 06/07/2012   SpO2 94%   BMI 25.75 kg/m  BP Readings from Last 3 Encounters:  01/24/20 118/84  06/20/19 122/84  04/30/19 118/72    Wt Readings from Last 3 Encounters:  01/24/20 150 lb (68 kg)  06/20/19 166 lb 8 oz (75.5 kg)  04/30/19 169 lb 4 oz (76.8 kg)    Physical Exam Constitutional:      General: Hayley Miller is not in acute distress.    Appearance: Hayley Miller is well-developed.  HENT:     Head: Normocephalic.     Right Ear: External ear normal.     Left Ear: External ear normal.     Nose: Nose normal.  Eyes:     General:        Right eye: No discharge.        Left eye: No discharge.     Conjunctiva/sclera: Conjunctivae normal.     Pupils: Pupils are equal,  round, and reactive to light.  Neck:     Thyroid: No thyromegaly.     Vascular: No JVD.     Trachea: No tracheal deviation.  Cardiovascular:     Rate and Rhythm: Normal rate and regular rhythm.     Heart sounds: Normal heart sounds.  Pulmonary:     Effort: No respiratory distress.     Breath sounds: No stridor. No wheezing.  Abdominal:     General: Bowel sounds are normal. There is no distension.     Palpations: Abdomen is soft. There is no mass.     Tenderness: There is no abdominal tenderness. There is no guarding or rebound.  Genitourinary:    Adnexa:        Right: Tenderness present.   Musculoskeletal:        General: No tenderness.     Cervical back: Normal range of motion and neck supple.  Lymphadenopathy:     Cervical: No cervical adenopathy.  Skin:    Findings: No erythema or rash.  Neurological:     Cranial Nerves: No cranial nerve deficit.     Motor: No abnormal muscle tone.     Coordination: Coordination normal.     Deep Tendon Reflexes: Reflexes normal.  Psychiatric:        Behavior: Behavior normal.        Thought Content: Thought content normal.        Judgment: Judgment normal.   LS w/pain eryth nasal mucosa   Lab Results  Component Value Date   WBC 9.7 04/02/2019   HGB 14.3 04/02/2019   HCT 43.2 04/02/2019   PLT 397.0 04/02/2019   GLUCOSE 95 04/02/2019   CHOL 209 (H) 04/02/2019   TRIG 250.0 (H) 04/02/2019   HDL 40.70 04/02/2019   LDLDIRECT 117.0 04/02/2019   LDLCALC 101 (H) 12/14/2018   ALT 13 04/02/2019   AST 15 04/02/2019   NA 140 04/02/2019   K 3.6 04/02/2019   CL 104 04/02/2019   CREATININE 1.26 (H) 04/02/2019   BUN 22 04/02/2019   CO2 26 04/02/2019   TSH 2.18 04/02/2019   INR 1.0 12/14/2018   HGBA1C 5.4 12/14/2018    No results found.  Assessment & Plan:   There are no diagnoses linked to this encounter.   No orders of the defined types were placed in this encounter.    Follow-up: No follow-ups on file.  Walker Kehr,  MD

## 2020-01-24 NOTE — Assessment & Plan Note (Signed)
Doing well on Coreg, Hyzaar, spironolactone

## 2020-01-24 NOTE — Assessment & Plan Note (Signed)
ASA, NTG prn, Pravachol

## 2020-01-24 NOTE — Assessment & Plan Note (Addendum)
Worse Increase Tramadol ER to 300 mg/d  Potential benefits of a long term opioids use as well as potential risks (i.e. addiction risk, apnea etc) and complications (i.e. Somnolence, constipation and others) were explained to the patient and were aknowledged.

## 2020-01-24 NOTE — Assessment & Plan Note (Signed)
Ceftin po

## 2020-01-24 NOTE — Addendum Note (Signed)
Addended by: Earnstine Regal on: 01/24/2020 11:26 AM   Modules accepted: Orders

## 2020-01-24 NOTE — Assessment & Plan Note (Signed)
Treat sinusitis

## 2020-01-24 NOTE — Assessment & Plan Note (Signed)
Depo-medrol IM Pt is refusing COVID 19 vaccination. Advised to get a JJ vaccine ASAP

## 2020-01-24 NOTE — Patient Instructions (Signed)
[place] vaccine: please call [place] Vaccine Line at [phone].

## 2020-01-29 ENCOUNTER — Telehealth: Payer: Self-pay

## 2020-01-29 ENCOUNTER — Ambulatory Visit: Payer: Medicare Other

## 2020-01-29 NOTE — Telephone Encounter (Signed)
2:00p. AWV-S PHONE VISIT: left several messages for patient to return call.

## 2020-02-24 IMAGING — US US AXILLARY LEFT
1 series · 4 of 4 positions shown · non-contrast
Comparison: Previous exam(s).

CLINICAL DATA: Palpable lump left axilla

EXAM:
DIGITAL DIAGNOSTIC bilateral MAMMOGRAM WITH CAD AND TOMO
ULTRASOUND left BREAST

[Series 1: us axillary left · 0.07mm/px · 4 of 4 slices shown]
[im 1/4]
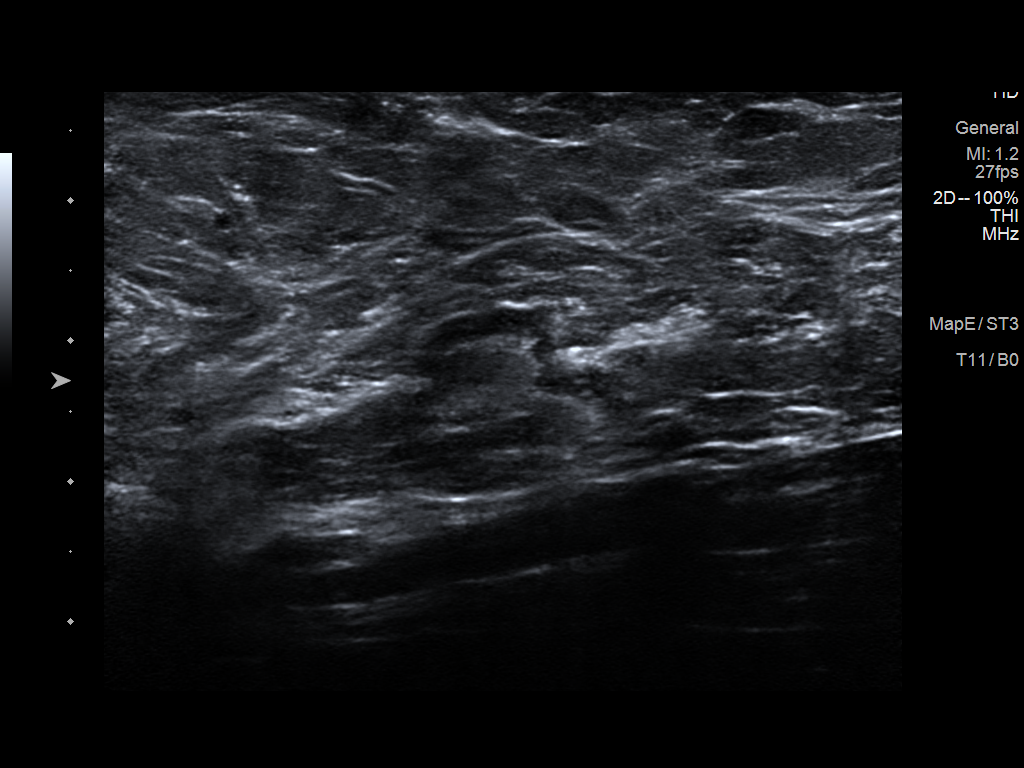
[im 2/4]
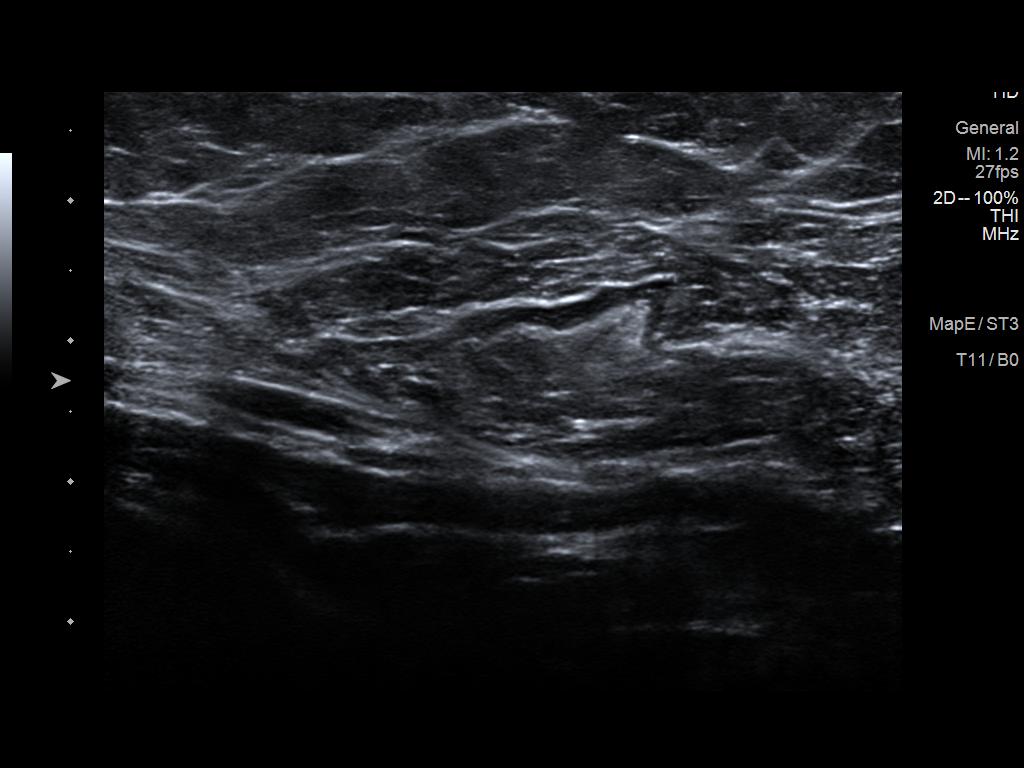
[im 3/4]
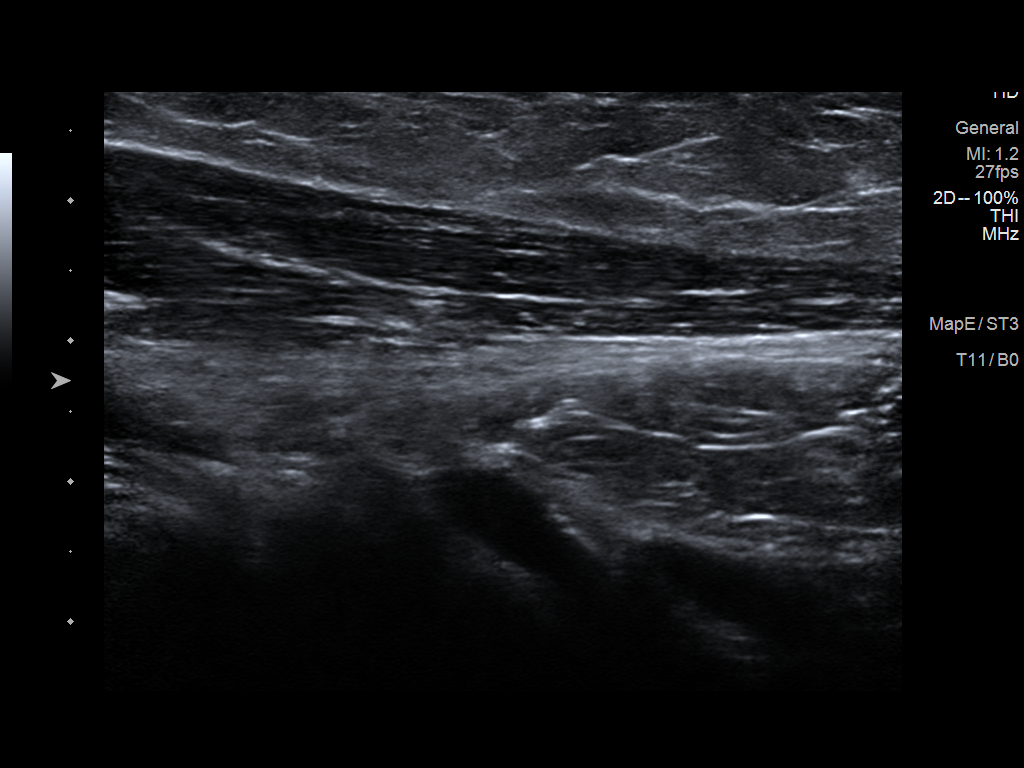
[im 4/4]
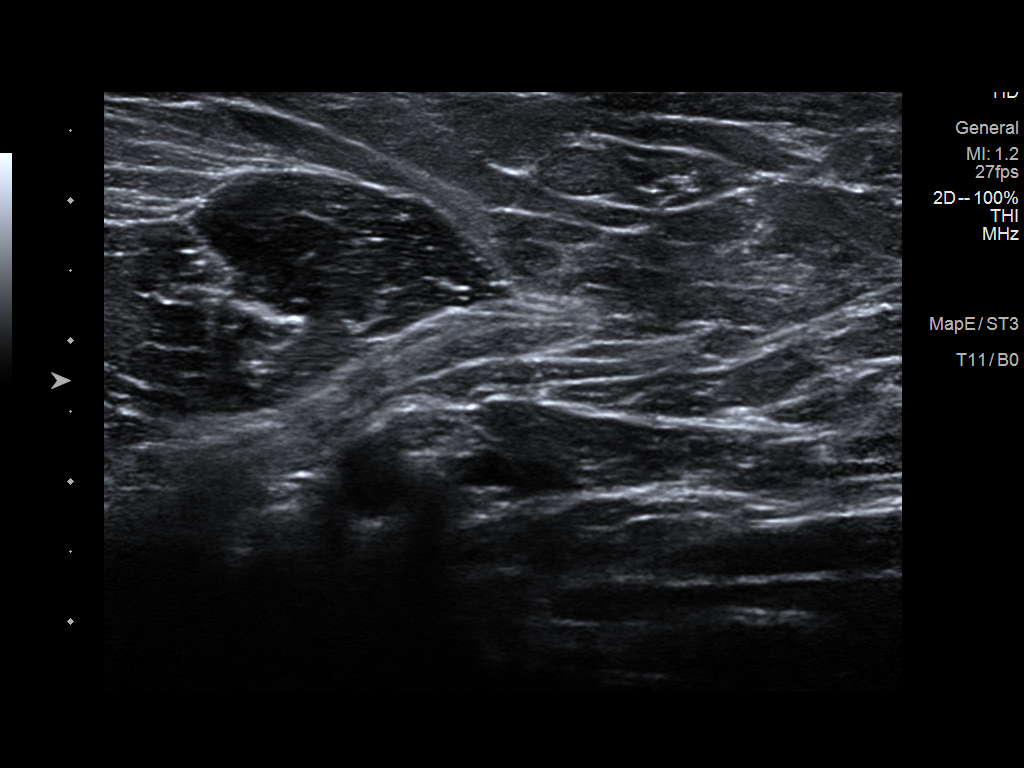

[4 of 4 positions shown; findings below may reference images not displayed]

ACR Breast Density Category c: The breast tissue is heterogeneously
dense, which may obscure small masses.
FINDINGS: Cc and MLO views of bilateral breasts, spot tangential view of left
axilla are submitted. Bilateral breast parenchyma is stable. No
suspicious abnormality is identified.

Mammographic images were processed with CAD.

Targeted ultrasound is performed, showing no focal abnormal discrete
cystic or solid lesion in the palpable area left axilla.
IMPRESSION: Benign findings.

RECOMMENDATION:
Routine screening mammogram in 1 year.

I have discussed the findings and recommendations with the patient.
If applicable, a reminder letter will be sent to the patient
regarding the next appointment.

BI-RADS CATEGORY  2: Benign.

## 2020-02-28 ENCOUNTER — Other Ambulatory Visit: Payer: Self-pay | Admitting: Internal Medicine

## 2020-04-28 ENCOUNTER — Ambulatory Visit: Payer: Medicare Other | Admitting: Internal Medicine

## 2020-04-30 ENCOUNTER — Ambulatory Visit (INDEPENDENT_AMBULATORY_CARE_PROVIDER_SITE_OTHER): Payer: Medicare Other | Admitting: Internal Medicine

## 2020-04-30 ENCOUNTER — Encounter: Payer: Self-pay | Admitting: Internal Medicine

## 2020-04-30 ENCOUNTER — Other Ambulatory Visit: Payer: Self-pay

## 2020-04-30 DIAGNOSIS — I10 Essential (primary) hypertension: Secondary | ICD-10-CM | POA: Diagnosis not present

## 2020-04-30 DIAGNOSIS — G8929 Other chronic pain: Secondary | ICD-10-CM

## 2020-04-30 DIAGNOSIS — I25110 Atherosclerotic heart disease of native coronary artery with unstable angina pectoris: Secondary | ICD-10-CM | POA: Diagnosis not present

## 2020-04-30 DIAGNOSIS — M545 Low back pain, unspecified: Secondary | ICD-10-CM

## 2020-04-30 DIAGNOSIS — J449 Chronic obstructive pulmonary disease, unspecified: Secondary | ICD-10-CM

## 2020-04-30 MED ORDER — CYCLOBENZAPRINE HCL 5 MG PO TABS: 5.0000 mg | ORAL_TABLET | Freq: Every day | ORAL | 1 refills | Status: DC

## 2020-04-30 MED ORDER — LOSARTAN POTASSIUM-HCTZ 100-25 MG PO TABS: 1.0000 | ORAL_TABLET | Freq: Every day | ORAL | 1 refills | Status: DC

## 2020-04-30 MED ORDER — NITROGLYCERIN 0.4 MG SL SUBL: 0.4000 mg | SUBLINGUAL_TABLET | SUBLINGUAL | 0 refills | Status: DC | PRN

## 2020-04-30 MED ORDER — TRAMADOL HCL ER 200 MG PO TB24: 200.0000 mg | ORAL_TABLET | Freq: Every morning | ORAL | 2 refills | Status: DC

## 2020-04-30 MED ORDER — LOSARTAN POTASSIUM-HCTZ 100-25 MG PO TABS: 1.0000 | ORAL_TABLET | Freq: Every day | ORAL | 3 refills | Status: DC

## 2020-04-30 MED ORDER — AMLODIPINE BESYLATE 10 MG PO TABS: 10.0000 mg | ORAL_TABLET | Freq: Every day | ORAL | 3 refills | Status: DC

## 2020-04-30 MED ORDER — NITROGLYCERIN 0.4 MG SL SUBL: 0.4000 mg | SUBLINGUAL_TABLET | SUBLINGUAL | 2 refills | Status: DC | PRN

## 2020-04-30 MED ORDER — AMLODIPINE BESYLATE 10 MG PO TABS: 10.0000 mg | ORAL_TABLET | Freq: Every day | ORAL | 1 refills | Status: DC

## 2020-04-30 NOTE — Progress Notes (Signed)
Subjective:  Patient ID: Hayley Miller, female    DOB: [birth year]  Age: 60 y.o. MRN: [id number]  CC: Follow-up (3 month f/u- Requesting refills on her Amlodipine, Losartan, Flexeril and Nitroglycerin)   HPI Hayley Miller presents for chronic back pain, CAD, CHF  Outpatient Medications Prior to Visit  Medication Sig Dispense Refill  . acetaminophen (TYLENOL) 325 MG tablet Take 650 mg by mouth every 6 (six) hours as needed for headache.    [name] aspirin EC 81 MG tablet Take 81 mg by mouth daily.    . carvedilol (COREG) 25 MG tablet Take 1 tablet (25 mg total) by mouth 2 (two) times daily with a meal. 180 tablet 3  . cetirizine (ZYRTEC) 10 MG tablet Take 1 tablet (10 mg total) by mouth daily. 30 tablet 0  . cyclobenzaprine (FLEXERIL) 5 MG tablet TAKE ONE TABLET AT BEDTIME. 30 tablet 2  . esomeprazole (NEXIUM) 40 MG capsule TAKE 1 CAPSULE AT 12:00 NOON EACH [place]. 90 capsule 3  . fluticasone (FLONASE) 50 MCG/ACT nasal spray USE 2 SPRAYS EACH NOSTRIL ONCE A Heady AS NEEDED FOR ALLERGIES OR CONGESTION. 16 g 0  . hydrALAZINE (APRESOLINE) 25 MG tablet TAKE 4 TABLETS 3 TIMES A Pearlman. 270 tablet 3  . Multiple Vitamin (MULTIVITAMIN WITH MINERALS) TABS tablet Take 1 tablet by mouth daily.    . pravastatin (PRAVACHOL) 40 MG tablet TAKE (1) TABLET DAILY AT BEDTIME. 90 tablet 3  . spironolactone (ALDACTONE) 25 MG tablet Take 1 tablet (25 mg total) by mouth daily. 90 tablet 3  . Tetrahydrozoline HCl (VISINE OP) Place 1 drop into both eyes daily as needed (dry eyes).    . traMADol (ULTRAM-ER) 200 MG 24 hr tablet Take 1 tablet (200 mg total) by mouth in the morning. 30 tablet 2  . amLODipine (NORVASC) 10 MG tablet TAKE 1 TABLET ONCE DAILY. 90 tablet 1  . losartan-hydrochlorothiazide (HYZAAR) 100-25 MG tablet Take 1 tablet by mouth daily. 90 tablet 3  . nitroGLYCERIN (NITROSTAT) 0.4 MG SL tablet Place 1 tablet (0.4 mg total) under the tongue every 5 (five) minutes as needed for chest pain. 5 tablet 0  .  traMADol (ULTRAM-ER) 100 MG 24 hr tablet Take 1 tablet (100 mg total) by mouth at bedtime as needed for pain. 30 tablet 2  . cefUROXime (CEFTIN) 250 MG tablet Take 1 tablet (250 mg total) by mouth 2 (two) times daily. (Patient not taking: Reported on 04/30/2020) 20 tablet 0  . methylPREDNISolone (MEDROL DOSEPAK) 4 MG TBPK tablet As directed (Patient not taking: Reported on 04/30/2020) 21 tablet 0  . TraMADol HCl 200 MG TB24 Take 1 tablet by mouth daily as needed. Take 1 by mouth daily as needed     No facility-administered medications prior to visit.    ROS: Review of Systems  Constitutional: Positive for fatigue. Negative for activity change, appetite change, chills and unexpected weight change.  HENT: Negative for congestion, mouth sores and sinus pressure.   Eyes: Negative for visual disturbance.  Respiratory: Negative for cough and chest tightness.   Gastrointestinal: Negative for abdominal pain and nausea.  Genitourinary: Negative for difficulty urinating, frequency and vaginal pain.  Musculoskeletal: Positive for arthralgias, back pain and gait problem.  Skin: Negative for pallor and rash.  Neurological: Negative for dizziness, tremors, weakness, numbness and headaches.  Psychiatric/Behavioral: Negative for confusion and sleep disturbance.    Objective:  BP 120/84 (BP Location: Left Arm)   Pulse 89   Temp 98 F (36.7 C) (  Oral)   Ht 5\' 4"  (1.626 m)   Wt 155 lb 12.8 oz (70.7 kg)   LMP 06/07/2012   SpO2 95%   BMI 26.74 kg/m   BP Readings from Last 3 Encounters:  04/30/20 120/84  01/24/20 118/84  06/20/19 122/84    Wt Readings from Last 3 Encounters:  04/30/20 155 lb 12.8 oz (70.7 kg)  01/24/20 150 lb (68 kg)  06/20/19 166 lb 8 oz (75.5 kg)    Physical Exam Constitutional:      General: She is not in acute distress.    Appearance: She is well-developed.  HENT:     Head: Normocephalic.     Right Ear: External ear normal.     Left Ear: External ear normal.     Nose:  Nose normal.     Mouth/Throat:     Mouth: Oropharynx is clear and moist.  Eyes:     General:        Right eye: No discharge.        Left eye: No discharge.     Conjunctiva/sclera: Conjunctivae normal.     Pupils: Pupils are equal, round, and reactive to light.  Neck:     Thyroid: No thyromegaly.     Vascular: No JVD.     Trachea: No tracheal deviation.  Cardiovascular:     Rate and Rhythm: Normal rate and regular rhythm.     Heart sounds: Normal heart sounds.  Pulmonary:     Effort: No respiratory distress.     Breath sounds: No stridor. No wheezing.  Abdominal:     General: Bowel sounds are normal. There is no distension.     Palpations: Abdomen is soft. There is no mass.     Tenderness: There is no abdominal tenderness. There is no guarding or rebound.  Musculoskeletal:        General: No tenderness or edema.     Cervical back: Normal range of motion and neck supple.  Lymphadenopathy:     Cervical: No cervical adenopathy.  Skin:    Findings: No erythema or rash.  Neurological:     Mental Status: She is oriented to person, place, and time.     Cranial Nerves: No cranial nerve deficit.     Motor: No abnormal muscle tone.     Coordination: Coordination normal.     Deep Tendon Reflexes: Reflexes normal.  Psychiatric:        Mood and Affect: Mood and affect normal.        Behavior: Behavior normal.        Thought Content: Thought content normal.        Judgment: Judgment normal.     Lab Results  Component Value Date   WBC 9.7 04/02/2019   HGB 14.3 04/02/2019   HCT 43.2 04/02/2019   PLT 397.0 04/02/2019   GLUCOSE 95 04/02/2019   CHOL 209 (H) 04/02/2019   TRIG 250.0 (H) 04/02/2019   HDL 40.70 04/02/2019   LDLDIRECT 117.0 04/02/2019   LDLCALC 101 (H) 12/14/2018   ALT 13 04/02/2019   AST 15 04/02/2019   NA 140 04/02/2019   K 3.6 04/02/2019   CL 104 04/02/2019   CREATININE 1.26 (H) 04/02/2019   BUN 22 04/02/2019   CO2 26 04/02/2019   TSH 2.18 04/02/2019   INR  1.0 12/14/2018   HGBA1C 5.4 12/14/2018    No results found.  Assessment & Plan:   There are no diagnoses linked to this encounter.   Meds  ordered this encounter  Medications  . amLODipine (NORVASC) 10 MG tablet    Sig: Take 1 tablet (10 mg total) by mouth daily.    Dispense:  90 tablet    Refill:  1  . losartan-hydrochlorothiazide (HYZAAR) 100-25 MG tablet    Sig: Take 1 tablet by mouth daily.    Dispense:  90 tablet    Refill:  1  . nitroGLYCERIN (NITROSTAT) 0.4 MG SL tablet    Sig: Place 1 tablet (0.4 mg total) under the tongue every 5 (five) minutes as needed for chest pain.    Dispense:  5 tablet    Refill:  0     Follow-up: No follow-ups on file.  Walker Kehr, MD

## 2020-04-30 NOTE — Assessment & Plan Note (Addendum)
Tramadol prn - d/c 2/21. Tramadol ER 200 mg/d.  Flexeril as needed  Potential benefits of a long term opioids use as well as potential risks (i.e. addiction risk, apnea etc) and complications (i.e. Somnolence, constipation and others) were explained to the patient and were aknowledged.

## 2020-04-30 NOTE — Assessment & Plan Note (Addendum)
Continue with ASA, NTG prn, Pravachol

## 2020-05-04 NOTE — Assessment & Plan Note (Signed)
Blood pressure is controlled.  Continue with Coreg, Hyzaar, spironolactone

## 2020-05-04 NOTE — Assessment & Plan Note (Signed)
Smoking discussed.  She needs to quit

## 2020-05-25 ENCOUNTER — Other Ambulatory Visit: Payer: Self-pay | Admitting: Internal Medicine

## 2020-05-30 ENCOUNTER — Telehealth: Payer: Self-pay | Admitting: *Deleted

## 2020-05-30 NOTE — Telephone Encounter (Signed)
Rec'd PA back med has been approved w/ Effective dates from 05/30/2020 through 05/30/2021. Faxed to [facility].Marland KitchenJohny Chess

## 2020-05-30 NOTE — Telephone Encounter (Signed)
Rec'd fax for Cyclobenzaprine 5mg  . Completed vis cover-my-meds w/ Key: [id number]. Waiting on [employer] to respond w/approval status.Marland KitchenJohny Chess

## 2020-07-29 ENCOUNTER — Other Ambulatory Visit: Payer: Self-pay | Admitting: Internal Medicine

## 2020-07-29 ENCOUNTER — Other Ambulatory Visit: Payer: Self-pay | Admitting: Physician Assistant

## 2020-07-29 ENCOUNTER — Ambulatory Visit: Payer: Medicare Other | Admitting: Internal Medicine

## 2020-07-29 NOTE — Telephone Encounter (Signed)
Patient is requesting a refill of the following medications: Requested Prescriptions   Pending Prescriptions Disp Refills  . traMADol (ULTRAM-ER) 100 MG 24 hr tablet [Pharmacy Med Name: tramadol ER 100 mg tablet,extended release 24 hr] 30 tablet 2    Sig: TAKE 1 TABLET AT BEDTIME AS NEEDED.    Date of patient request: 07/29/20  Last office visit: 04/30/20  Date of last refill: 05/20/20  Last refill amount: 30,2  refills Follow up time period per chart: n/a

## 2020-08-02 ENCOUNTER — Other Ambulatory Visit: Payer: Self-pay | Admitting: Physician Assistant

## 2020-09-12 ENCOUNTER — Other Ambulatory Visit: Payer: Self-pay | Admitting: Internal Medicine

## 2020-09-12 NOTE — Telephone Encounter (Signed)
Check [place] registry last filled 07/29/2020.Marland KitchenJohny Chess

## 2020-09-30 ENCOUNTER — Telehealth: Payer: Self-pay | Admitting: Internal Medicine

## 2020-09-30 MED ORDER — CARVEDILOL 25 MG PO TABS: 25.0000 mg | ORAL_TABLET | Freq: Two times a day (BID) | ORAL | 3 refills | Status: DC

## 2020-09-30 MED ORDER — SPIRONOLACTONE 25 MG PO TABS: 25.0000 mg | ORAL_TABLET | Freq: Every day | ORAL | 3 refills | Status: DC

## 2020-09-30 NOTE — Telephone Encounter (Signed)
Pt's medications were sent to pt's pharmacy as requested. Confirmation received.  

## 2020-09-30 NOTE — Telephone Encounter (Signed)
*  STAT* If patient is at the pharmacy, call can be transferred to refill team.   1. Which medications need to be refilled? (please list name of each medication and dose if known)  spironolactone (ALDACTONE) 25 MG tablet carvedilol (COREG) 25 MG tablet  2. Which pharmacy/location (including street and city if local pharmacy) is medication to be sent to? [place], [address]  3. Do they need a [address] or [address] supply? Needs enough medication to last until appt date    Pt has been scheduled for first available appointment, 01/21/2021. States she is completely out of both medications.

## 2020-10-02 ENCOUNTER — Telehealth: Payer: Self-pay | Admitting: Internal Medicine

## 2020-10-02 NOTE — Chronic Care Management (AMB) (Signed)
  Chronic Care Management   Outreach Note  10/02/2020 Name: Hayley Miller MRN: [id number] DOB: 1960-08-14  Referred by: Cassandria Anger, MD Reason for referral : No chief complaint on file.   An unsuccessful telephone outreach was attempted today. The patient was referred to the pharmacist for assistance with care management and care coordination.   Follow Up Plan:   Lauretta Grill Upstream Scheduler

## 2020-10-14 ENCOUNTER — Telehealth: Payer: Self-pay | Admitting: Internal Medicine

## 2020-10-14 NOTE — Chronic Care Management (AMB) (Signed)
  Care Management  Note   10/14/2020 Name: Hayley Miller MRN: [id number] DOB: 1960/12/03  Hayley Miller is a 60 y.o. year old female who is a primary care patient of Plotnikov, Evie Lacks, MD. The care management team was consulted for assistance with chronic disease management and care coordination needs.   [name] was given information about Care Management services today including:  CCM service includes personalized support from designated clinical staff supervised by the physician, including individualized plan of care and coordination with other care providers 24/7 contact phone numbers for assistance for urgent and routine care needs. Service will only be billed when office clinical staff spend 20 minutes or more in a month to coordinate care. Only one practitioner may furnish and bill the service in a calendar month. The patient may stop CCM services at amy time (effective at the end of the month) by phone call to the office staff. The patient will be responsible for cost sharing (co-pay) or up to 20% of the service fee (after annual deductible is met)  Patient agreed to services and verbal consent obtained.  Follow up plan:   Face to Face appointment with care management team member scheduled for: 11/17/2020 _0   Noelle Penner Upstream Scheduler

## 2020-10-14 NOTE — Chronic Care Management (AMB) (Signed)
  Care Management   Follow Up Note   10/14/2020 Name: Hayley Miller MRN: [id number] DOB: January 19, 1961   Referred by: Cassandria Anger, MD Reason for referral : No chief complaint on file.   A second unsuccessful telephone outreach was attempted today. The patient was referred to the case management team for assistance with care management and care coordination.   Follow Up Plan: The care management team will reach out to the patient again over the next 7 days.   Noelle Penner Upstream Scheduler

## 2020-10-29 ENCOUNTER — Encounter: Payer: Self-pay | Admitting: Internal Medicine

## 2020-10-29 ENCOUNTER — Other Ambulatory Visit: Payer: Self-pay

## 2020-10-29 ENCOUNTER — Ambulatory Visit (INDEPENDENT_AMBULATORY_CARE_PROVIDER_SITE_OTHER): Payer: Medicare Other | Admitting: Internal Medicine

## 2020-10-29 DIAGNOSIS — F17200 Nicotine dependence, unspecified, uncomplicated: Secondary | ICD-10-CM | POA: Diagnosis not present

## 2020-10-29 DIAGNOSIS — I25110 Atherosclerotic heart disease of native coronary artery with unstable angina pectoris: Secondary | ICD-10-CM

## 2020-10-29 DIAGNOSIS — J449 Chronic obstructive pulmonary disease, unspecified: Secondary | ICD-10-CM | POA: Diagnosis not present

## 2020-10-29 DIAGNOSIS — G8929 Other chronic pain: Secondary | ICD-10-CM

## 2020-10-29 DIAGNOSIS — M545 Low back pain, unspecified: Secondary | ICD-10-CM

## 2020-10-29 DIAGNOSIS — N184 Chronic kidney disease, stage 4 (severe): Secondary | ICD-10-CM

## 2020-10-29 DIAGNOSIS — R5383 Other fatigue: Secondary | ICD-10-CM | POA: Insufficient documentation

## 2020-10-29 DIAGNOSIS — R5382 Chronic fatigue, unspecified: Secondary | ICD-10-CM

## 2020-10-29 LAB — COMPREHENSIVE METABOLIC PANEL
ALT: 22 U/L (ref 0–35)
AST: 20 U/L (ref 0–37)
Albumin: 4.6 g/dL (ref 3.5–5.2)
Alkaline Phosphatase: 76 U/L (ref 39–117)
BUN: 25 mg/dL — ABNORMAL HIGH (ref 6–23)
CO2: 28 mEq/L (ref 19–32)
Calcium: 10.6 mg/dL — ABNORMAL HIGH (ref 8.4–10.5)
Chloride: 102 mEq/L (ref 96–112)
Creatinine, Ser: 1.9 mg/dL — ABNORMAL HIGH (ref 0.40–1.20)
GFR: 28.74 mL/min — ABNORMAL LOW (ref >60.00)
Glucose, Bld: 118 mg/dL — ABNORMAL HIGH (ref 70–99)
Potassium: 3.7 mEq/L (ref 3.5–5.1)
Sodium: 141 mEq/L (ref 135–145)
Total Bilirubin: 0.4 mg/dL (ref 0.2–1.2)
Total Protein: 8.3 g/dL (ref 6.0–8.3)

## 2020-10-29 LAB — CBC WITH DIFFERENTIAL/PLATELET
Basophils Absolute: 0.1 10*3/uL (ref 0.0–0.1)
Basophils Relative: 0.7 % (ref 0.0–3.0)
Eosinophils Absolute: 0.1 10*3/uL (ref 0.0–0.7)
Eosinophils Relative: 1 % (ref 0.0–5.0)
HCT: 42.4 % (ref 36.0–46.0)
Hemoglobin: 14 g/dL (ref 12.0–15.0)
Lymphocytes Relative: 24 % (ref 12.0–46.0)
Lymphs Abs: 2.6 10*3/uL (ref 0.7–4.0)
MCHC: 32.9 g/dL (ref 30.0–36.0)
MCV: 95.1 fl (ref 78.0–100.0)
Monocytes Absolute: 0.7 10*3/uL (ref 0.1–1.0)
Monocytes Relative: 6.4 % (ref 3.0–12.0)
Neutro Abs: 7.3 10*3/uL (ref 1.4–7.7)
Neutrophils Relative %: 67.9 % (ref 43.0–77.0)
Platelets: 409 10*3/uL — ABNORMAL HIGH (ref 150.0–400.0)
RBC: 4.46 Mil/uL (ref 3.87–5.11)
RDW: 14 % (ref 11.5–15.5)
WBC: 10.8 10*3/uL — ABNORMAL HIGH (ref 4.0–10.5)

## 2020-10-29 LAB — TSH: TSH: 2.07 u[IU]/mL (ref 0.35–5.50)

## 2020-10-29 MED ORDER — TRAMADOL HCL ER 200 MG PO TB24: 200.0000 mg | ORAL_TABLET | Freq: Every morning | ORAL | 2 refills | Status: DC

## 2020-10-29 NOTE — Assessment & Plan Note (Signed)
Tramadol ER 200 mg/d  Potential benefits of a long term opioids use as well as potential risks (i.e. addiction risk, apnea etc) and complications (i.e. Somnolence, constipation and others) were explained to the patient and were aknowledged.

## 2020-10-29 NOTE — Assessment & Plan Note (Signed)
We discussed - some wt gain Pt quit smoking in June 2022

## 2020-10-29 NOTE — Progress Notes (Addendum)
Subjective:  Patient ID: Hayley Miller, female    DOB: 1961/02/14  Age: 60 y.o. MRN: [id number]  CC: Fatigue   HPI Hayley Miller presents for weakness and fatigue x 1 month. She has to stop all meds x 2 days before she can go places - otherwise too tired: can't get dressed or take a shower... F/u CAD, CRF, COPD Pt quit smoking 2 months ago  Outpatient Medications Prior to Visit  Medication Sig Dispense Refill   acetaminophen (TYLENOL) 325 MG tablet Take 650 mg by mouth every 6 (six) hours as needed for headache.     amLODipine (NORVASC) 10 MG tablet Take 1 tablet (10 mg total) by mouth daily. 90 tablet 3   aspirin EC 81 MG tablet Take 81 mg by mouth daily.     carvedilol (COREG) 25 MG tablet Take 1 tablet (25 mg total) by mouth 2 (two) times daily with a meal. Please keep upcoming appt with Cardiologist in October 2022 before anymore refills. Thank you Final Attempt 30 tablet 3   cetirizine (ZYRTEC) 10 MG tablet Take 1 tablet (10 mg total) by mouth daily. 30 tablet 0   cyclobenzaprine (FLEXERIL) 5 MG tablet Take 1 tablet (5 mg total) by mouth at bedtime. 90 tablet 1   esomeprazole (NEXIUM) 40 MG capsule TAKE 1 CAPSULE AT 12:00 NOON EACH [place]. 90 capsule 3   fluticasone (FLONASE) 50 MCG/ACT nasal spray USE 2 SPRAYS EACH NOSTRIL ONCE A [place] AS NEEDED FOR ALLERGIES OR CONGESTION. 16 g 0   hydrALAZINE (APRESOLINE) 25 MG tablet TAKE 4 TABLETS 3 TIMES A [place]. 270 tablet 3   losartan-hydrochlorothiazide (HYZAAR) 100-25 MG tablet Take 1 tablet by mouth daily. 90 tablet 3   Multiple Vitamin (MULTIVITAMIN WITH MINERALS) TABS tablet Take 1 tablet by mouth daily.     nitroGLYCERIN (NITROSTAT) 0.4 MG SL tablet Place 1 tablet (0.4 mg total) under the tongue every 5 (five) minutes as needed for chest pain. 20 tablet 2   pravastatin (PRAVACHOL) 40 MG tablet TAKE (1) TABLET DAILY AT BEDTIME. 90 tablet 3   spironolactone (ALDACTONE) 25 MG tablet Take 1 tablet (25 mg total) by mouth daily. Please keep  upcoming appt with Cardiologist before anymore refills. Thank you Final Attempt 30 tablet 3   Tetrahydrozoline HCl (VISINE OP) Place 1 drop into both eyes daily as needed (dry eyes).     traMADol (ULTRAM-ER) 200 MG 24 hr tablet TAKE ONE TABLET BY MOUTH IN THE MORNING 30 tablet 2   No facility-administered medications prior to visit.    ROS: Review of Systems  Constitutional:  Positive for fatigue and unexpected weight change. Negative for activity change, appetite change, chills and fever.  HENT:  Negative for congestion, mouth sores and sinus pressure.   Eyes:  Negative for visual disturbance.  Respiratory:  Negative for cough and chest tightness.   Gastrointestinal:  Negative for abdominal pain and nausea.  Genitourinary:  Negative for difficulty urinating, frequency and vaginal pain.  Musculoskeletal:  Negative for back pain and gait problem.  Skin:  Negative for pallor and rash.  Neurological:  Positive for weakness. Negative for dizziness, tremors, numbness and headaches.  Psychiatric/Behavioral:  Negative for confusion and sleep disturbance.    Objective:  BP 128/84 (BP Location: Left Arm)   Pulse 100   Temp 98 F (36.7 C) (Oral)   Ht 5\' 4"  (1.626 m)   Wt 162 lb 6.4 oz (73.7 kg)   LMP 06/07/2012   SpO2 98%  BMI 27.88 kg/m   BP Readings from Last 3 Encounters:  10/29/20 128/84  04/30/20 120/84  01/24/20 118/84    Wt Readings from Last 3 Encounters:  10/29/20 162 lb 6.4 oz (73.7 kg)  04/30/20 155 lb 12.8 oz (70.7 kg)  01/24/20 150 lb (68 kg)    Physical Exam Constitutional:      General: She is not in acute distress.    Appearance: She is well-developed. She is obese.  HENT:     Head: Normocephalic.     Right Ear: External ear normal.     Left Ear: External ear normal.     Nose: Nose normal.  Eyes:     General:        Right eye: No discharge.        Left eye: No discharge.     Conjunctiva/sclera: Conjunctivae normal.     Pupils: Pupils are equal, round,  and reactive to light.  Neck:     Thyroid: No thyromegaly.     Vascular: No JVD.     Trachea: No tracheal deviation.  Cardiovascular:     Rate and Rhythm: Normal rate and regular rhythm.     Heart sounds: Normal heart sounds.  Pulmonary:     Effort: No respiratory distress.     Breath sounds: No stridor. No wheezing.  Abdominal:     General: Bowel sounds are normal. There is no distension.     Palpations: Abdomen is soft. There is no mass.     Tenderness: There is no abdominal tenderness. There is no guarding or rebound.  Musculoskeletal:        General: No tenderness.     Cervical back: Normal range of motion and neck supple. No rigidity.  Lymphadenopathy:     Cervical: No cervical adenopathy.  Skin:    Findings: No erythema or rash.  Neurological:     Mental Status: She is oriented to person, place, and time.     Cranial Nerves: No cranial nerve deficit.     Motor: Weakness present. No abnormal muscle tone.     Coordination: Coordination normal.     Deep Tendon Reflexes: Reflexes normal.  Psychiatric:        Behavior: Behavior normal.        Thought Content: Thought content normal.        Judgment: Judgment normal.    Lab Results  Component Value Date   WBC 9.7 04/02/2019   HGB 14.3 04/02/2019   HCT 43.2 04/02/2019   PLT 397.0 04/02/2019   GLUCOSE 95 04/02/2019   CHOL 209 (H) 04/02/2019   TRIG 250.0 (H) 04/02/2019   HDL 40.70 04/02/2019   LDLDIRECT 117.0 04/02/2019   LDLCALC 101 (H) 12/14/2018   ALT 13 04/02/2019   AST 15 04/02/2019   NA 140 04/02/2019   K 3.6 04/02/2019   CL 104 04/02/2019   CREATININE 1.26 (H) 04/02/2019   BUN 22 04/02/2019   CO2 26 04/02/2019   TSH 2.18 04/02/2019   INR 1.0 12/14/2018   HGBA1C 5.4 12/14/2018    No results found.  Assessment & Plan:     Walker Kehr, MD

## 2020-10-29 NOTE — Addendum Note (Signed)
Addended by: Boris Lown B on: 10/29/2020 02:18 PM   Modules accepted: Orders

## 2020-10-29 NOTE — Assessment & Plan Note (Addendum)
Worse.  We will discontinue losartan HCT.  We may need to restart at half dose.  Keep review office visit.  Recheck labs.  She will need to be seen by nephrology.

## 2020-10-29 NOTE — Patient Instructions (Signed)
Stop one Rx at the time - let me know which one is making you tired

## 2020-10-29 NOTE — Assessment & Plan Note (Signed)
  Pt quit smoking in June 2022

## 2020-10-29 NOTE — Assessment & Plan Note (Signed)
Cont on ASA, NTG prn, Pravachol BP meds Pt quit smoking in June 2022

## 2020-10-29 NOTE — Assessment & Plan Note (Addendum)
Weakness and fatigue x 1 month. She has to stop all meds x 2 days before she can go places - otherwise too tired Stop one Rx at the time - let me know which one is making you tired Check CBC, CMET, TSH

## 2020-10-30 NOTE — Addendum Note (Signed)
Addended by: Cassandria Anger on: 10/30/2020 12:02 AM   Modules accepted: Orders, Level of Service

## 2020-11-17 ENCOUNTER — Ambulatory Visit (INDEPENDENT_AMBULATORY_CARE_PROVIDER_SITE_OTHER): Payer: Medicare Other

## 2020-11-17 ENCOUNTER — Other Ambulatory Visit: Payer: Self-pay

## 2020-11-17 VITALS — BP 122/88 | HR 98

## 2020-11-17 DIAGNOSIS — G8929 Other chronic pain: Secondary | ICD-10-CM

## 2020-11-17 DIAGNOSIS — N184 Chronic kidney disease, stage 4 (severe): Secondary | ICD-10-CM

## 2020-11-17 DIAGNOSIS — I10 Essential (primary) hypertension: Secondary | ICD-10-CM | POA: Diagnosis not present

## 2020-11-17 DIAGNOSIS — E785 Hyperlipidemia, unspecified: Secondary | ICD-10-CM

## 2020-11-17 DIAGNOSIS — K219 Gastro-esophageal reflux disease without esophagitis: Secondary | ICD-10-CM

## 2020-11-17 DIAGNOSIS — M545 Low back pain, unspecified: Secondary | ICD-10-CM

## 2020-11-17 NOTE — Patient Instructions (Signed)
Visit Information   PATIENT GOALS:   Goals Addressed             This Visit's Progress    Track and Manage My Blood Pressure-Hypertension       Timeframe:  Long-Range Goal Priority:  High Start Date:   11/17/2020                          Expected End Date:  05/20/2021                     Follow Up Date 11/24/2020   - check blood pressure daily - choose a place to take my blood pressure (home, clinic or office, retail store) - write blood pressure results in a log or diary    Why is this important?   You won't feel high blood pressure, but it can still hurt your blood vessels.  High blood pressure can cause heart or kidney problems. It can also cause a stroke.  Making lifestyle changes like losing a little weight or eating less salt will help.  Checking your blood pressure at home and at different times of the [place] can help to control blood pressure.  If the doctor prescribes medicine remember to take it the way the doctor ordered.  Call the office if you cannot afford the medicine or if there are questions about it.        Track and Manage My Symptoms-Chronic Kidney       Timeframe:  Long-Range Goal Priority:  High Start Date:   11/17/2020                          Expected End Date:   05/20/2021                    Follow Up Date 11/24/2020   - balance periods of rest with activity as needed - exercise at least 2 to 3 times per week - keep all lab appointments - make shared treatment decisions with doctor - take medicine as prescribed    Why is this important?   Keeping track of symptoms can help you feel the best. It also helps the doctor stay on top of any changes to the disease. It may also help keep your disease from getting worse.  Taking simple steps can help you cope with symptoms like feeling very tired or itchy skin.          Consent to CCM Services: Ms. Bari was given information about Chronic Care Management services including:  CCM service includes  personalized support from designated clinical staff supervised by her physician, including individualized plan of care and coordination with other care providers 24/7 contact phone numbers for assistance for urgent and routine care needs. Service will only be billed when office clinical staff spend 20 minutes or more in a month to coordinate care. Only one practitioner may furnish and bill the service in a calendar month. The patient may stop CCM services at any time (effective at the end of the month) by phone call to the office staff. The patient will be responsible for cost sharing (co-pay) of up to 20% of the service fee (after annual deductible is met).  Patient agreed to services and verbal consent obtained.   The patient verbalized understanding of instructions, educational materials, and care plan provided today and declined offer to receive copy of patient instructions,  educational materials, and care plan.   Telephone follow up appointment with care management team member scheduled for: 1 week The patient has been provided with contact information for the care management team and has been advised to call with any health related questions or concerns.   Tomasa Blase, PharmD Clinical Pharmacist, [place]    CLINICAL CARE PLAN: Patient Care Plan: CCM Care Plan     Problem Identified: HTN, HLD, Previous NSTEMI, CAD, CKD, Chronic Pain, Allergic Rhinitis, GERD   Priority: High  Onset Date: 11/17/2020     Long-Range Goal: Disease Management   Start Date: 11/17/2020  Expected End Date: 05/20/2021  This Visit's Progress: On track  Priority: High  Note:   Current Barriers:  Unable to independently monitor therapeutic efficacy Unable to achieve control of LDL  Unable to maintain control of blood pressure/ pain   Pharmacist Clinical Goal(s):  Patient will achieve adherence to monitoring guidelines and medication adherence to achieve therapeutic efficacy achieve  control of blood pressure, LDL, and pain as evidenced by blood pressure logs, next lipid panel, and pain levels through collaboration with PharmD and provider.   Interventions: 1:1 collaboration with Plotnikov, Evie Lacks, MD regarding development and update of comprehensive plan of care as evidenced by provider attestation and co-signature Inter-disciplinary care team collaboration (see longitudinal plan of care) Comprehensive medication review performed; medication list updated in electronic medical record  Hypertension (BP goal <140/90) -Not ideally controlled -Current treatment: Carvedilol 22m - 1 tablet twice daily - not taking  Spironolactone 262m- 1 tablet daily  - not taking  Amlodipine 104m 1 tablet daily  Hydralazine 63m38m4 tablets 3 times daily - taking 1 tablet daily  -Medications previously tried: atenolol, hctz, lisinopril, labetolol, losartan,   -Current home readings: reports that most recently has been running 150-170/80-84 -Current dietary habits: reports to eating a low sodium diet -Current exercise habits: limited at this time  -Denies hypotensive/hypertensive symptoms -Educated on BP goals and benefits of medications for prevention of heart attack, stroke and kidney damage; Daily salt intake goal < 2300 mg; Exercise goal of 150 minutes per week; Importance of home blood pressure monitoring; Proper BP monitoring technique; Symptoms of hypotension and importance of maintaining adequate hydration; -Counseled to monitor BP at home daily, document, and provide log at future appointments -Recommended to continue current medication - Ideally would be able to bring HR down slightly lower as she is averaging 98-100 BPM, will have patient monitor blood pressure for a week, will discuss results of BP log, possibly restart coreg at a lower dose and discontinue hydralazine if appropriate   Hyperlipidemia/ history of NSTEMI / CAD: (LDL goal < 70) -Not ideally controlled Lab  Results  Component Value Date   LDLCALC 101 (H) 12/14/2018  -Current treatment: Pravastatin 40mg50m tablet daily  Aspirin 81mg 14mtablet daily  Nitroglycerin 0.4mg - 74mablet every 5 minutes as needed for chest pain -Medications previously tried: gemfibrozil   -Current dietary patterns: reports that diet can be elevated in red meats/ fried of fatty foods at times  -Current exercise habits: limited at this time -Educated on Cholesterol goals;  Benefits of statin for ASCVD risk reduction; Importance of limiting foods high in cholesterol; Exercise goal of 150 minutes per week; -Recommended to continue current medication - patient preferred minimal changes at this time, will focus of blood pressure control, and reduction of sedating medications at this time, in the meantime patient agreeable to dietary  changes to start improving cholesterol  GERD (Goal: Prevention of acid reflux) -Controlled -Current treatment  Esomeprazole 39m - 1 capsule daily  -Medications previously tried: dexilant, carafate, ranitidine  -Counseled on diet and exercise extensively Recommended to continue current medication  Allergic Rhinitis (Goal: Prevention of allergy attacks / control of allergy symptoms) -Controlled -Current treatment  Cetirizine 171m- 1 tablet daily as needed - using 2 times per month Fluticasone 5081m- 2 sprays in each nostril daily as needed - using a maximum of 2 times weekly -Medications previously tried: mometasone, xyzal, loratadine,   -Recommended to continue current medication  Chronic Pain / Low back pain (Goal: pain control) -Not ideally controlled -Current treatment  Tramadol ER 200m44m1 tablet daily  Cyclobenzaprine 5mg 22mtablet at bedtime  Acetaminophen 325mg 103mtablets every 6 hours as needed  -Medications previously tried: norco, tramadol  -Counseled on sedating effects of tramadol and cyclobenzaprine Recommended for patient to hold cyclobenzaprine as she does not  describe pain as a spasm/ spasticity related pain, instead describes as a localized aching pain in her back - recommended for patient to trial lidocaine 4% patches - 1 patch daily 12 hours on/ 12 hours off - also recommending for patient to replace Tramadol ER with tramadol IR as the ER formulation may be a contributing factor in her level of fatigue in setting of chronic kidney disease, IR would allow for patient to dose as needed and possibly control pain with a lower daily dose of medication and less fatigue from medication  Chronic Kidney Disease (Goal: Prevention of disease progression) -Not ideally controlled - has follow up with PCP to redo labs 12/08/20 -Last eGFR - 28.74 mL/min (10/29/2020) -last CrCl - 37.28 mL/min  -Current treatment  Avoidance of nephrotoxic medications / adequate blood pressure control to prevention further damage to kidneys  -Recommended avoidance of oral NSAID medications, replacing Tramadol ER with Tramadol IR tablets   Health Maintenance -Vaccine gaps: Pneumococcal Vaccine, [place] Vaccine, and Influenza vaccine  -Current therapy:  Multivitamin - 1 tablet daily  Visine eye drops - 1 drop into each eye daily as needed  Zinc 50mg -13mablet daily  -Educated on Cost vs benefit of each product must be carefully weighed by individual consumer -Patient is satisfied with current therapy and denies issues -Recommended to continue current medication  Patient Goals/Self-Care Activities Patient will:  - focus on medication adherence by taking medications as prescribed check blood pressure daily, document, and provide at future appointments engage in dietary modifications by reducing high cholesterol food intake   Follow Up Plan: Telephone follow up appointment with care management team member scheduled for: The patient has been provided with contact information for the care management team and has been advised to call with any health related questions or concerns.

## 2020-11-17 NOTE — Progress Notes (Addendum)
Chronic Care Management Pharmacy Note  11/17/2020 Name:  Hayley Miller Well MRN:  [id number] DOB:  13-Nov-1960  Summary: - Patient recently has been seen by PCP due to extreme fatigue which she attributes to her medication, notes that since she has started decreasing/ discontinuing medications she has felt an improvement in energy levels, but fatigue still remains an issue -Has just recently found her blood pressure cuff since she has moved, able to start checking blood pressure regularly again, checked in office and was 122/88 with a pulse of 98 -Reports that pain is localized in her lower back, described as an aching pain, non-radiating, denies symptoms of spasms/spasticity  Recommendations/Changes made from today's visit: -Recommending at this time for patient to hold cyclobenzaprine as this is a medication I believe can be adding to her fatigue level as well, would also recommend for patient to discontinue tramadol ER in setting of reduced kidney function - could be contributing to fatigue levels as well, believe that it would be better for patient to use tramadol 38m tablet - 1 tablet every 6 hours as needed  -Patient to trial lidocaine 4% patches - 1 patch daily (12 hours on and 12 hours off) -Patient to continue monitoring blood pressure and HR and reach out should BP >140/90 or if HR >100 beats per minute  Subjective: Hayley Miller is an 60y.o. year old female who is a primary patient of Plotnikov, AEvie Lacks MD.  The CCM team was consulted for assistance with disease management and care coordination needs.    Engaged with patient face to face for initial visit in response to provider referral for pharmacy case management and/or care coordination services.   Consent to Services:  The patient was given the following information about Chronic Care Management services today, agreed to services, and gave verbal consent: 1. CCM service includes personalized support from designated  clinical staff supervised by the primary care provider, including individualized plan of care and coordination with other care providers 2. 24/7 contact phone numbers for assistance for urgent and routine care needs. 3. Service will only be billed when office clinical staff spend 20 minutes or more in a month to coordinate care. 4. Only one practitioner may furnish and bill the service in a calendar month. 5.The patient may stop CCM services at any time (effective at the end of the month) by phone call to the office staff. 6. The patient will be responsible for cost sharing (co-pay) of up to 20% of the service fee (after annual deductible is met). Patient agreed to services and consent obtained.  Patient Care Team: Plotnikov, AEvie Lacks MD as PCP - General (Internal Medicine) RFay Records MD as PCP - Cardiology (Cardiology) AOlga Millers MD (Obstetrics and Gynecology) SLadene Artist MD (Gastroenterology) RGus Height MD (Inactive) (Obstetrics and Gynecology) STomasa Blase RShawnee [facility] Surgery Center LLCas Pharmacist (Pharmacist)  Recent office visits: 10/29/2020 - Dr. PAlain MarionMD (PCP) - CC chronic fatigue, has to stop all medications 2 days prior to going places, stopped losartan HCTZ due to worsening kidney function - labs ordered, follow up with nephrology 04/30/2020 - Dr. PAlain MarionMD (PCP) - switched tramadol prn to Tramadol ER 2044mdaily, all other medications continued   Recent consult visits: none  Hospital visits: None in previous 6 months  Objective:  Lab Results  Component Value Date   CREATININE 1.90 (H) 10/29/2020   BUN 25 (H) 10/29/2020   GFR 28.74 (L) 10/29/2020   GFRNONAA 26 (L) 01/16/2019  GFRAA 31 (L) 01/16/2019   NA 141 10/29/2020   K 3.7 10/29/2020   CALCIUM 10.6 (H) 10/29/2020   CO2 28 10/29/2020   GLUCOSE 118 (H) 10/29/2020    Lab Results  Component Value Date/Time   HGBA1C 5.4 12/14/2018 04:20 PM   HGBA1C 5.3 10/02/2015 04:28 PM   GFR 28.74 (L) 10/29/2020 02:19 PM    GFR 52.97 (L) 04/02/2019 04:46 PM    Last diabetic Eye exam:  No results found for: HMDIABEYEEXA  Last diabetic Foot exam:  No results found for: HMDIABFOOTEX   Lab Results  Component Value Date   CHOL 209 (H) 04/02/2019   HDL 40.70 04/02/2019   LDLCALC 101 (H) 12/14/2018   LDLDIRECT 117.0 04/02/2019   TRIG 250.0 (H) 04/02/2019   CHOLHDL 5 04/02/2019    Hepatic Function Latest Ref Rng & Units 10/29/2020 04/02/2019 01/16/2019  Total Protein 6.0 - 8.3 g/dL 8.3 8.2 8.0  Albumin 3.5 - 5.2 g/dL 4.6 4.7 4.3  AST 0 - 37 U/L _0 ALT 0 - 35 U/L _1 Alk Phosphatase 39 - 117 U/L 76 77 63  Total Bilirubin 0.2 - 1.2 mg/dL 0.4 0.3 0.7  Bilirubin, Direct 0.0 - 0.3 mg/dL - 0.1 -    Lab Results  Component Value Date/Time   TSH 2.07 10/29/2020 02:19 PM   TSH 2.18 04/02/2019 04:46 PM    CBC Latest Ref Rng & Units 10/29/2020 04/02/2019 01/16/2019  WBC 4.0 - 10.5 K/uL 10.8(H) 9.7 7.2  Hemoglobin 12.0 - 15.0 g/dL 14.0 14.3 13.7  Hematocrit 36.0 - 46.0 % 42.4 43.2 42.0  Platelets 150.0 - 400.0 K/uL 409.0(H) 397.0 344    Lab Results  Component Value Date/Time   VD25OH 37.17 04/02/2019 04:46 PM   VD25OH 32.09 12/01/2017 02:44 PM    Clinical ASCVD: Yes  The ASCVD Risk score Mikey Bussing DC Jr., et al., 2013) failed to calculate for the following reasons:   The patient has a prior MI or stroke diagnosis    Depression screen [facility] 2/9 04/30/2020  Decreased Interest 0  Down, Depressed, Hopeless 0  PHQ - 2 Score 0  Altered sleeping 0  Tired, decreased energy 0  Change in appetite 0  Feeling bad or failure about yourself  0  Trouble concentrating 0  Moving slowly or fidgety/restless 0  Suicidal thoughts 0  PHQ-9 Score 0  Some recent data might be hidden    Social History   Tobacco Use  Smoking Status Former   Packs/Dario: 1.00   Years: 3.00   Pack years: 3.00   Types: Cigarettes   Quit date: 04/17/2017   Years since quitting: 3.5  Smokeless Tobacco Never  Tobacco Comments    Divorced, lives with female domestic partner   BP Readings from Last 3 Encounters:  10/29/20 128/84  04/30/20 120/84  01/24/20 118/84   Pulse Readings from Last 3 Encounters:  10/29/20 100  04/30/20 89  01/24/20 86   Wt Readings from Last 3 Encounters:  10/29/20 162 lb 6.4 oz (73.7 kg)  04/30/20 155 lb 12.8 oz (70.7 kg)  01/24/20 150 lb (68 kg)   BMI Readings from Last 3 Encounters:  10/29/20 27.88 kg/m  04/30/20 26.74 kg/m  01/24/20 25.75 kg/m    Assessment/Interventions: Review of patient past medical history, allergies, medications, health status, including review of consultants reports, laboratory and other test data, was performed as part of comprehensive evaluation and provision of chronic care management services.   SDOH:  (Social  Determinants of Health) assessments and interventions performed: Yes  SDOH Screenings   Alcohol Screen: Not on file  Depression (PHQ2-9): Low Risk    PHQ-2 Score: 0  Financial Resource Strain: Not on file  Food Insecurity: Not on file  Housing: Not on file  Physical Activity: Not on file  Social Connections: Not on file  Stress: Not on file  Tobacco Use: Medium Risk   Smoking Tobacco Use: Former   Smokeless Tobacco Use: Never  Transportation Needs: Not on file    [place]  Allergies  Allergen Reactions   Clonidine Hydrochloride Other (See Comments)    REACTION: FATIGUE   Gabapentin     Too sleepy   Ibuprofen Other (See Comments)    GI bleed   Lovastatin Other (See Comments)    Join pain    Medications Reviewed Today     Reviewed by Cassandria Anger, MD (Physician) on 10/29/20 at [place] List Status: <None>   Medication Order Taking? Sig Documenting Provider Last Dose Status Informant  acetaminophen (TYLENOL) 325 MG tablet [id number] Yes Take 650 mg by mouth every 6 (six) hours as needed for headache. [provider] Taking Active Self  amLODipine (NORVASC) 10 MG tablet [id number] Yes Take 1 tablet  (10 mg total) by mouth daily. Plotnikov, Evie Lacks, MD Taking Active   aspirin EC 81 MG tablet [id number] Yes Take 81 mg by mouth daily. [provider] Taking Active Self           Med Note Annamaria Boots, Colbert Ewing Dec 14, 2018  7:19 PM)    carvedilol (COREG) 25 MG tablet [id number] Yes Take 1 tablet (25 mg total) by mouth 2 (two) times daily with a meal. Please keep upcoming appt with Cardiologist in October 2022 before anymore refills. Thank you Final Attempt Fay Records, MD Taking Active   cetirizine (ZYRTEC) 10 MG tablet [id number] Yes Take 1 tablet (10 mg total) by mouth daily. Marrian Salvage, FNP Taking Active Self  cyclobenzaprine (FLEXERIL) 5 MG tablet [id number] Yes Take 1 tablet (5 mg total) by mouth at bedtime. Plotnikov, Evie Lacks, MD Taking Active   esomeprazole (NEXIUM) 40 MG capsule [id number] Yes TAKE 1 CAPSULE AT 12:00 NOON EACH [place]. Plotnikov, Evie Lacks, MD Taking Active   fluticasone (FLONASE) 50 MCG/ACT nasal spray [id number] Yes USE 2 SPRAYS EACH NOSTRIL ONCE A [place] AS NEEDED FOR ALLERGIES OR CONGESTION. Binnie Rail, MD Taking Active   hydrALAZINE (APRESOLINE) 25 MG tablet [id number] Yes TAKE 4 TABLETS 3 TIMES A [place]. Plotnikov, Evie Lacks, MD Taking Active   losartan-hydrochlorothiazide [facility]) 100-25 MG tablet [id number] Yes Take 1 tablet by mouth daily. Plotnikov, Evie Lacks, MD Taking Active   Multiple Vitamin (MULTIVITAMIN WITH MINERALS) TABS tablet [id number] Yes Take 1 tablet by mouth daily. [provider] Taking Active Self  nitroGLYCERIN (NITROSTAT) 0.4 MG SL tablet [id number] Yes Place 1 tablet (0.4 mg total) under the tongue every 5 (five) minutes as needed for chest pain. Plotnikov, Evie Lacks, MD Taking Active   pravastatin (PRAVACHOL) 40 MG tablet [id number] Yes TAKE (1) TABLET DAILY AT BEDTIME. Plotnikov, Evie Lacks, MD Taking Active   spironolactone (ALDACTONE) 25 MG tablet [id number] Yes Take 1 tablet (25 mg total) by mouth daily. Please keep upcoming  appt with Cardiologist before anymore refills. Thank you Final Attempt Fay Records, MD Taking Active   Tetrahydrozoline HCl [facility]) [id number] Yes Place 1 drop into both eyes daily as needed (dry  eyes). [provider] Taking Active Self  traMADol (ULTRAM-ER) 200 MG 24 hr tablet [id number] Yes TAKE ONE TABLET BY MOUTH IN THE MORNING Plotnikov, Evie Lacks, MD Taking Active             Patient Active Problem List   Diagnosis Date Noted   Tobacco dependence 10/29/2020   Fatigue 10/29/2020   CKD (chronic kidney disease) stage 4, GFR 15-29 ml/min ([place]) 10/29/2020   Arthralgia 04/02/2019   COPD (chronic obstructive pulmonary disease) ([place]) 04/02/2019   Renal artery stenosis ([place]) 12/17/2018   Headache 03/10/2018   Sore throat 03/10/2018   Hair thinning 12/01/2017   Rash 12/01/2017   Hypertensive urgency 07/19/2017   Other forms of angina pectoris ([place]) 06/02/2016   Acute sinusitis 10/03/2015   Syncope 03/11/2014   Tinnitus 03/11/2014   Fibromyalgia 04/18/2009   NSTEMI (non-ST elevated myocardial infarction) ([place]) 04/15/2009   LOW BACK PAIN 01/12/2008   Depression 07/27/2007   Dyslipidemia 06/27/2007   Severe uncontrolled hypertension 06/27/2007   CAD (coronary artery disease) 06/27/2007   GERD 06/27/2007    Immunization History  Administered Date(s) Administered   Influenza Whole 01/12/2008   Influenza,inj,Quad PF,6+ Mos 01/22/2014   Influenza-Unspecified 01/27/2018   Tdap 03/11/2014    Conditions to be addressed/monitored:  Hypertension, Hyperlipidemia, Coronary Artery Disease, GERD, Chronic Kidney Disease, and Allergic Rhinitis  There are no care plans that you recently modified to display for this patient.     Medication Assistance: None required.  Patient affirms current coverage meets needs.   Patient's preferred pharmacy is:  [facility], [place]  [id number] Phone: [phone] Fax: [phone]   Uses pill box? No - reports that she feels she can manage without  Pt endorses 90-95% compliance  Care Plan and Follow Up Patient Decision:  Patient agrees to Care Plan and Follow-up.  Plan: Telephone follow up appointment with care management team member scheduled for:  1 week and The patient has been provided with contact information for the care management team and has been advised to call with any health related questions or concerns.    Current Barriers:  Unable to independently monitor therapeutic efficacy Unable to achieve control of LDL  Unable to maintain control of blood pressure/ pain   Pharmacist Clinical Goal(s):  Patient will achieve adherence to monitoring guidelines and medication adherence to achieve therapeutic efficacy achieve control of blood pressure, LDL, and pain as evidenced by blood pressure logs, next lipid panel, and pain levels  through collaboration with PharmD and provider.   Interventions: 1:1 collaboration with Plotnikov, Evie Lacks, MD regarding development and update of comprehensive plan of care as evidenced by provider attestation and co-signature Inter-disciplinary care team collaboration (see longitudinal plan of care) Comprehensive medication review performed; medication list updated in electronic medical record  Hypertension (BP goal <140/90) -Not ideally controlled -Current treatment: Carvedilol 72m - 1 tablet twice daily - not taking  Spironolactone 267m- 1 tablet daily  - not taking  Amlodipine 1044m 1 tablet daily  Hydralazine 16m57m4 tablets 3 times daily - taking 1 tablet daily  -Medications previously tried: atenolol, hctz, lisinopril, labetolol, losartan,   -Current home readings: reports that most recently has been running 150-170/80-84 -Current dietary habits: reports to eating a low sodium diet -Current exercise habits: limited at this time  -Denies hypotensive/hypertensive  symptoms -Educated on BP goals and benefits of medications for prevention  of heart attack, stroke and kidney damage; Daily salt intake goal < 2300 mg; Exercise goal of 150 minutes per week; Importance of home blood pressure monitoring; Proper BP monitoring technique; Symptoms of hypotension and importance of maintaining adequate hydration; -Counseled to monitor BP at home daily, document, and provide log at future appointments -Recommended to continue current medication - Ideally would be able to bring HR down slightly lower as she is averaging 98-100 BPM,  will have patient monitor blood pressure for a week, will discuss results of BP log, possibly restart coreg at a lower dose and discontinue hydralazine if appropriate   Hyperlipidemia/ history of NSTEMI / CAD: (LDL goal < 70) -Not ideally controlled Lab Results  Component Value Date   LDLCALC 101 (H) 12/14/2018  -Current treatment: Pravastatin 39m - 1 tablet daily  Aspirin 862m- 1 tablet daily  Nitroglycerin 0.31m31m 1 tablet every 5 minutes as needed for chest pain -Medications previously tried: gemfibrozil   -Current dietary patterns: reports that diet can be elevated in red meats/ fried of fatty foods at times  -Current exercise habits: limited at this time -Educated on Cholesterol goals;  Benefits of statin for ASCVD risk reduction; Importance of limiting foods high in cholesterol; Exercise goal of 150 minutes per week; -Recommended to continue current medication - patient preferred minimal changes at this time, will focus of blood pressure control, and reduction of sedating medications at this time, in the meantime patient agreeable to dietary changes to start improving cholesterol  GERD (Goal: Prevention of acid reflux) -Controlled -Current treatment  Esomeprazole 48m28m1 capsule daily  -Medications previously tried: dexilant, carafate, ranitidine  -Counseled on diet and exercise extensively Recommended to continue  current medication  Allergic Rhinitis (Goal: Prevention of allergy attacks / control of allergy symptoms) -Controlled -Current treatment  Cetirizine 10mg29m tablet daily as needed - using 2 times per month Fluticasone 50mcg73m sprays in each nostril daily as needed - using a maximum of 2 times weekly -Medications previously tried: mometasone, xyzal, loratadine,   -Recommended to continue current medication  Chronic Pain / Low back pain (Goal: pain control) -Not ideally controlled -Current treatment  Tramadol ER 200mg -75mablet daily  Cyclobenzaprine 5mg -1 53mlet at bedtime  Acetaminophen 325mg - 273mlets every 6 hours as needed  -Medications previously tried: norco, tramadol  -Counseled on sedating effects of tramadol and cyclobenzaprine Recommended for patient to hold cyclobenzaprine as she does not describe pain as a spasm/ spasticity related pain, instead describes as a localized aching pain in her back - recommended for patient to trial lidocaine 4% patches - 1 patch daily 12 hours on/ 12 hours off - also recommending for patient to replace Tramadol ER with tramadol IR as the ER formulation may be a contributing factor in her level of fatigue in setting of chronic kidney disease, IR would allow for patient to dose as needed and possibly control pain with a lower daily dose of medication and less fatigue from medication  Chronic Kidney Disease (Goal: Prevention of disease progression) -Not ideally controlled - has follow up with PCP to redo labs 12/08/20 -Last eGFR - 28.74 mL/min (10/29/2020) -last CrCl - 37.28 mL/min  -Current treatment  Avoidance of nephrotoxic medications / adequate blood pressure control to prevention further damage to kidneys  -Recommended avoidance of oral NSAID medications, replacing Tramadol ER with Tramadol IR tablets   Health Maintenance -Vaccine gaps: Pneumococcal Vaccine, [place] Vaccine, and Influenza vaccine  -Current therapy:  Multivitamin - 1  tablet daily  Visine eye drops - 1 drop into each eye daily as needed  Zinc 12m - 1 tablet daily  -Educated on Cost vs benefit of each product must be carefully weighed by individual consumer -Patient is satisfied with current therapy and denies issues -Recommended to continue current medication  Patient Goals/Self-Care Activities Patient will:  - focus on medication adherence by taking medications as prescribed check blood pressure daily, document, and provide at future appointments engage in dietary modifications by reducing high cholesterol food intake   Follow Up Plan: Telephone follow up appointment with care management team member scheduled for: The patient has been provided with contact information for the care management team and has been advised to call with any health related questions or concerns.   DTomasa Blase PharmD Clinical Pharmacist, LBedfordscreening examination/treatment/procedure(s) were performed by non-physician practitioner and as supervising physician I was immediately available for consultation/collaboration.  I agree with above. ALew Dawes MD

## 2020-11-24 ENCOUNTER — Telehealth: Payer: Medicare Other

## 2020-11-27 ENCOUNTER — Telehealth: Payer: Medicare Other

## 2020-11-27 NOTE — Progress Notes (Deleted)
Chronic Care Management Pharmacy Note  11/27/2020 Name:  Hayley Miller MRN:  [id number] DOB:  02/09/1961  Summary: - Patient recently has been seen by PCP due to extreme fatigue which she attributes to her medication, notes that since she has started decreasing/ discontinuing medications she has felt an improvement in energy levels, but fatigue still remains an issue -Has just recently found her blood pressure cuff since she has moved, able to start checking blood pressure regularly again, checked in office and was 122/88 with a pulse of 98 -Reports that pain is localized in her lower back, described as an aching pain, non-radiating, denies symptoms of spasms/spasticity  Recommendations/Changes made from today's visit: -Recommending at this time for patient to hold cyclobenzaprine as this is a medication I believe can be adding to her fatigue level as well, would also recommend for patient to discontinue tramadol ER in setting of reduced kidney function - could be contributing to fatigue levels as well, believe that it would be better for patient to use tramadol 34m tablet - 1 tablet every 6 hours as needed  -Patient to trial lidocaine 4% patches - 1 patch daily (12 hours on and 12 hours off) -Patient to continue monitoring blood pressure and HR and reach out should BP >140/90 or if HR >100 beats per minute  Subjective: PSelena Miller is an 60y.o. year old female who is a primary patient of Plotnikov, AEvie Lacks MD.  The CCM team was consulted for assistance with disease management and care coordination needs.    Engaged with patient by telephone for follow up visit in response to provider referral for pharmacy case management and/or care coordination services.   Consent to Services:  The patient was given the following information about Chronic Care Management services today, agreed to services, and gave verbal consent: 1. CCM service includes personalized support from designated  clinical staff supervised by the primary care provider, including individualized plan of care and coordination with other care providers 2. 24/7 contact phone numbers for assistance for urgent and routine care needs. 3. Service will only be billed when office clinical staff spend 20 minutes or more in a month to coordinate care. 4. Only one practitioner may furnish and bill the service in a calendar month. 5.The patient may stop CCM services at any time (effective at the end of the month) by phone call to the office staff. 6. The patient will be responsible for cost sharing (co-pay) of up to 20% of the service fee (after annual deductible is met). Patient agreed to services and consent obtained.  Patient Care Team: Plotnikov, AEvie Lacks MD as PCP - General (Internal Medicine) RFay Records MD as PCP - Cardiology (Cardiology) AOlga Millers MD (Obstetrics and Gynecology) SLadene Artist MD (Gastroenterology) RGus Height MD (Inactive) (Obstetrics and Gynecology) STomasa Blase [facility] Pharmac[facility] (Pharmacist)  Recent office visits: 10/29/2020 - Dr. PAlain MarionMD (PCP) - CC chronic fatigue, has to stop all medications 2 days prior to going places, stopped losartan HCTZ due to worsening kidney function - labs ordered, follow up with nephrology 04/30/2020 - Dr. PAlain MarionMD (PCP) - switched tramadol prn to Tramadol ER 2045mdaily, all other medications continued   Recent consult visits: none  Hospital visits: None in previous 6 months  Objective:  Lab Results  Component Value Date   CREATININE 1.90 (H) 10/29/2020   BUN 25 (H) 10/29/2020   GFR 28.74 (L) 10/29/2020   GFRNONAA 26 (L) 01/16/2019  GFRAA 31 (L) 01/16/2019   NA 141 10/29/2020   K 3.7 10/29/2020   CALCIUM 10.6 (H) 10/29/2020   CO2 28 10/29/2020   GLUCOSE 118 (H) 10/29/2020    Lab Results  Component Value Date/Time   HGBA1C 5.4 12/14/2018 04:20 PM   HGBA1C 5.3 10/02/2015 04:28 PM   GFR 28.74 (L) 10/29/2020 02:19 PM    GFR 52.97 (L) 04/02/2019 04:46 PM    Last diabetic Eye exam:  No results found for: HMDIABEYEEXA  Last diabetic Foot exam:  No results found for: HMDIABFOOTEX   Lab Results  Component Value Date   CHOL 209 (H) 04/02/2019   HDL 40.70 04/02/2019   LDLCALC 101 (H) 12/14/2018   LDLDIRECT 117.0 04/02/2019   TRIG 250.0 (H) 04/02/2019   CHOLHDL 5 04/02/2019    Hepatic Function Latest Ref Rng & Units 10/29/2020 04/02/2019 01/16/2019  Total Protein 6.0 - 8.3 g/dL 8.3 8.2 8.0  Albumin 3.5 - 5.2 g/dL 4.6 4.7 4.3  AST 0 - 37 U/L _0 ALT 0 - 35 U/L _1 Alk Phosphatase 39 - 117 U/L 76 77 63  Total Bilirubin 0.2 - 1.2 mg/dL 0.4 0.3 0.7  Bilirubin, Direct 0.0 - 0.3 mg/dL - 0.1 -    Lab Results  Component Value Date/Time   TSH 2.07 10/29/2020 02:19 PM   TSH 2.18 04/02/2019 04:46 PM    CBC Latest Ref Rng & Units 10/29/2020 04/02/2019 01/16/2019  WBC 4.0 - 10.5 K/uL 10.8(H) 9.7 7.2  Hemoglobin 12.0 - 15.0 g/dL 14.0 14.3 13.7  Hematocrit 36.0 - 46.0 % 42.4 43.2 42.0  Platelets 150.0 - 400.0 K/uL 409.0(H) 397.0 344    Lab Results  Component Value Date/Time   VD25OH 37.17 04/02/2019 04:46 PM   VD25OH 32.09 12/01/2017 02:44 PM    Clinical ASCVD: Yes  The ASCVD Risk score Mikey Bussing DC Jr., et al., 2013) failed to calculate for the following reasons:   The patient has a prior MI or stroke diagnosis    Depression screen [facility] 2/9 04/30/2020  Decreased Interest 0  Down, Depressed, Hopeless 0  PHQ - 2 Score 0  Altered sleeping 0  Tired, decreased energy 0  Change in appetite 0  Feeling bad or failure about yourself  0  Trouble concentrating 0  Moving slowly or fidgety/restless 0  Suicidal thoughts 0  PHQ-9 Score 0  Some recent data might be hidden    Social History   Tobacco Use  Smoking Status Former   Packs/Mcdaniel: 1.00   Years: 3.00   Pack years: 3.00   Types: Cigarettes   Quit date: 04/17/2017   Years since quitting: 3.6  Smokeless Tobacco Never  Tobacco Comments    Divorced, lives with female domestic partner   BP Readings from Last 3 Encounters:  11/17/20 122/88  10/29/20 128/84  04/30/20 120/84   Pulse Readings from Last 3 Encounters:  11/17/20 98  10/29/20 100  04/30/20 89   Wt Readings from Last 3 Encounters:  10/29/20 162 lb 6.4 oz (73.7 kg)  04/30/20 155 lb 12.8 oz (70.7 kg)  01/24/20 150 lb (68 kg)   BMI Readings from Last 3 Encounters:  10/29/20 27.88 kg/m  04/30/20 26.74 kg/m  01/24/20 25.75 kg/m    Assessment/Interventions: Review of patient past medical history, allergies, medications, health status, including review of consultants reports, laboratory and other test data, was performed as part of comprehensive evaluation and provision of chronic care management services.   SDOH:  (Social  Determinants of Health) assessments and interventions performed: Yes  SDOH Screenings   Alcohol Screen: Not on file  Depression (PHQ2-9): Low Risk    PHQ-2 Score: 0  Financial Resource Strain: Low Risk    Difficulty of Paying Living Expenses: Not hard at all  Food Insecurity: Not on file  Housing: Not on file  Physical Activity: Not on file  Social Connections: Not on file  Stress: Not on file  Tobacco Use: Medium Risk   Smoking Tobacco Use: Former   Smokeless Tobacco Use: Never  Transportation Needs: Not on file    [place]  Allergies  Allergen Reactions   Clonidine Hydrochloride Other (See Comments)    REACTION: FATIGUE   Gabapentin     Too sleepy   Ibuprofen Other (See Comments)    GI bleed   Lovastatin Other (See Comments)    Joint pain    Medications Reviewed Today     Reviewed by Tomasa Blase, [facility] (Pharmacist) on 11/17/20 at [place] List Status: <None>   Medication Order Taking? Sig Documenting Provider Last Dose Status Informant  acetaminophen (TYLENOL) 325 MG tablet [id number] Yes Take 650 mg by mouth every 8 (eight) hours as needed for headache, moderate pain, mild pain or fever. [provider] Taking Active Self  amLODipine (NORVASC) 10 MG tablet [id number] Yes Take 1 tablet (10 mg total) by mouth daily. Plotnikov, Evie Lacks, MD Taking Active   aspirin EC 81 MG tablet [id number] Yes Take 81 mg by mouth daily. [provider] Taking Active Self           Med Note Annamaria Boots, Colbert Ewing Dec 14, 2018  7:19 PM)    carvedilol (COREG) 25 MG tablet [id number] No Take 1 tablet (25 mg total) by mouth 2 (two) times daily with a meal. Please keep upcoming appt with Cardiologist in October 2022 before anymore refills. Thank you Final Attempt  Patient not taking: Reported on 11/17/2020   Fay Records, MD Not Taking Active   cetirizine (ZYRTEC) 10 MG tablet [id number] Yes Take 1 tablet (10 mg total) by mouth daily.  Patient taking differently: Take 10 mg by mouth daily as needed.   Marrian Salvage, FNP Taking Active Self  cyclobenzaprine (FLEXERIL) 5 MG tablet [id number] Yes Take 1 tablet (5 mg total) by mouth at bedtime. Plotnikov, Evie Lacks, MD Taking Active   esomeprazole (NEXIUM) 40 MG capsule [id number] Yes TAKE 1 CAPSULE AT 12:00 NOON EACH [place]. Plotnikov, Evie Lacks, MD Taking Active   fluticasone (FLONASE) 50 MCG/ACT nasal spray [id number] Yes USE 2 SPRAYS EACH NOSTRIL ONCE A [place] AS NEEDED FOR ALLERGIES OR CONGESTION. Binnie Rail, MD Taking Active   hydrALAZINE (APRESOLINE) 25 MG tablet [id number] Yes TAKE 4 TABLETS 3 TIMES A [place].  Patient taking differently: Take 25 mg by mouth daily.   Plotnikov, Evie Lacks, MD Taking Active   Lidocaine Total [facility]) [id number] Yes Apply 4 % topically daily. 12 hours on and 12 hours off [provider]  Active   Multiple Vitamin (MULTIVITAMIN WITH MINERALS) TABS tablet [id number] Yes Take 1 tablet by mouth daily. [provider] Taking Active Self  nitroGLYCERIN (NITROSTAT) 0.4 MG SL tablet [id number] No Place 1 tablet (0.4 mg total) under the tongue every 5 (five) minutes as needed for chest pain.  Patient not taking:  Reported on 11/17/2020   PlotnikovEvie Lacks, MD Not Taking Active   pravastatin (PRAVACHOL) 40 MG tablet [id number] Yes TAKE (  1) TABLET DAILY AT BEDTIME. Plotnikov, Evie Lacks, MD Taking Active   spironolactone (ALDACTONE) 25 MG tablet [id number] No Take 1 tablet (25 mg total) by mouth daily. Please keep upcoming appt with Cardiologist before anymore refills. Thank you Final Attempt  Patient not taking: Reported on 11/17/2020   Fay Records, MD Not Taking Active   Tetrahydrozoline HCl [facility]) [id number] Yes Place 1 drop into both eyes daily as needed (dry eyes). [provider] Taking Active Self  traMADol (ULTRAM-ER) 200 MG 24 hr tablet [id number] Yes Take 1 tablet (200 mg total) by mouth every morning. Plotnikov, Evie Lacks, MD Taking Active             Patient Active Problem List   Diagnosis Date Noted   Tobacco dependence 10/29/2020   Fatigue 10/29/2020   CKD (chronic kidney disease) stage 4, GFR 15-29 ml/min (HCC) 10/29/2020   Arthralgia 04/02/2019   COPD (chronic obstructive pulmonary disease) ([place]) 04/02/2019   Renal artery stenosis (HCC) 12/17/2018   Headache 03/10/2018   Sore throat 03/10/2018   Hair thinning 12/01/2017   Rash 12/01/2017   Hypertensive urgency 07/19/2017   Other forms of angina pectoris ([place]) 06/02/2016   Acute sinusitis 10/03/2015   Syncope 03/11/2014   Tinnitus 03/11/2014   Fibromyalgia 04/18/2009   NSTEMI (non-ST elevated myocardial infarction) ([place]) 04/15/2009   LOW BACK PAIN 01/12/2008   Depression 07/27/2007   Dyslipidemia 06/27/2007   Severe uncontrolled hypertension 06/27/2007   CAD (coronary artery disease) 06/27/2007   GERD 06/27/2007    Immunization History  Administered Date(s) Administered   Influenza Whole 01/12/2008   Influenza,inj,Quad PF,6+ Mos 01/22/2014   Influenza-Unspecified 01/27/2018   Tdap 03/11/2014    Conditions to be addressed/monitored:  Hypertension, Hyperlipidemia, Coronary Artery Disease, GERD, Chronic  Kidney Disease, and Allergic Rhinitis  There are no care plans that you recently modified to display for this patient.     Medication Assistance: None required.  Patient affirms current coverage meets needs.   Patient's preferred pharmacy is:  [facility], [place] [address] Phone: [phone] Fax: [phone]   Uses pill box? No - reports that she feels she can manage without  Pt endorses 90-95% compliance  Care Plan and Follow Up Patient Decision:  Patient agrees to Care Plan and Follow-up.  Plan: Telephone follow up appointment with care management team member scheduled for:  1 week and The patient has been provided with contact information for the care management team and has been advised to call with any health related questions or concerns.    Current Barriers:  Unable to independently monitor therapeutic efficacy Unable to achieve control of LDL  Unable to maintain control of blood pressure/ pain   Pharmacist Clinical Goal(s):  Patient will achieve adherence to monitoring guidelines and medication adherence to achieve therapeutic efficacy achieve control of blood pressure, LDL, and pain as evidenced by blood pressure logs, next lipid panel, and pain levels  through collaboration with PharmD and provider.   Interventions: 1:1 collaboration with Plotnikov, Evie Lacks, MD regarding development and update of comprehensive plan of care as evidenced by provider attestation and co-signature Inter-disciplinary care team collaboration (see longitudinal plan of care) Comprehensive medication review performed; medication list updated in electronic medical record  Hypertension (BP goal <140/90) -Not ideally controlled -Current treatment: Carvedilol 44m - 1 tablet twice daily - not taking  Spironolactone 232m- 1 tablet daily  -  not taking  Amlodipine 50m - 1 tablet daily  Hydralazine  257m- 4 tablets 3 times daily - taking 1 tablet daily  -Medications previously tried: atenolol, hctz, lisinopril, labetolol, losartan,   -Current home readings: reports that most recently has been running 150-170/80-84 -Current dietary habits: reports to eating a low sodium diet -Current exercise habits: limited at this time  -Denies hypotensive/hypertensive symptoms -Educated on BP goals and benefits of medications for prevention of heart attack, stroke and kidney damage; Daily salt intake goal < 2300 mg; Exercise goal of 150 minutes per week; Importance of home blood pressure monitoring; Proper BP monitoring technique; Symptoms of hypotension and importance of maintaining adequate hydration; -Counseled to monitor BP at home daily, document, and provide log at future appointments -Recommended to continue current medication - Ideally would be able to bring HR down slightly lower as she is averaging 98-100 BPM,  will have patient monitor blood pressure for a week, will discuss results of BP log, possibly restart coreg at a lower dose and discontinue hydralazine if appropriate   Hyperlipidemia/ history of NSTEMI / CAD: (LDL goal < 70) -Not ideally controlled Lab Results  Component Value Date   LDLCALC 101 (H) 12/14/2018  -Current treatment: Pravastatin 4025m 1 tablet daily  Aspirin 96m57m1 tablet daily  Nitroglycerin 0.4mg 57m tablet every 5 minutes as needed for chest pain -Medications previously tried: gemfibrozil   -Current dietary patterns: reports that diet can be elevated in red meats/ fried of fatty foods at times  -Current exercise habits: limited at this time -Educated on Cholesterol goals;  Benefits of statin for ASCVD risk reduction; Importance of limiting foods high in cholesterol; Exercise goal of 150 minutes per week; -Recommended to continue current medication - patient preferred minimal changes at this time, will focus of blood pressure control, and reduction of  sedating medications at this time, in the meantime patient agreeable to dietary changes to start improving cholesterol  GERD (Goal: Prevention of acid reflux) -Controlled -Current treatment  Esomeprazole 40mg 52mcapsule daily  -Medications previously tried: dexilant, carafate, ranitidine  -Counseled on diet and exercise extensively Recommended to continue current medication  Allergic Rhinitis (Goal: Prevention of allergy attacks / control of allergy symptoms) -Controlled -Current treatment  Cetirizine 10mg -101mablet daily as needed - using 2 times per month Fluticasone 50mcg -69mprays in each nostril daily as needed - using a maximum of 2 times weekly -Medications previously tried: mometasone, xyzal, loratadine,   -Recommended to continue current medication  Chronic Pain / Low back pain (Goal: pain control) -Not ideally controlled -Current treatment  Tramadol ER 200mg - 158mlet daily  Cyclobenzaprine 5mg -1 ta18mt at bedtime  Acetaminophen 325mg - 2 t22mts every 6 hours as needed  -Medications previously tried: norco, tramadol  -Counseled on sedating effects of tramadol and cyclobenzaprine Recommended for patient to hold cyclobenzaprine as she does not describe pain as a spasm/ spasticity related pain, instead describes as a localized aching pain in her back - recommended for patient to trial lidocaine 4% patches - 1 patch daily 12 hours on/ 12 hours off - also recommending for patient to replace Tramadol ER with tramadol IR as the ER formulation may be a contributing factor in her level of fatigue in setting of chronic kidney disease, IR would allow for patient to dose as needed and possibly control pain with a lower daily dose of medication and less fatigue from medication  Chronic Kidney Disease (Goal:  Prevention of disease progression) -Not ideally controlled - has follow up with PCP to redo labs 12/08/20 -Last eGFR - 28.74 mL/min (10/29/2020) -last CrCl - 37.28 mL/min   -Current treatment  Avoidance of nephrotoxic medications / adequate blood pressure control to prevention further damage to kidneys  -Recommended avoidance of oral NSAID medications, replacing Tramadol ER with Tramadol IR tablets   Health Maintenance -Vaccine gaps: Pneumococcal Vaccine, [place] Vaccine, and Influenza vaccine  -Current therapy:  Multivitamin - 1 tablet daily  Visine eye drops - 1 drop into each eye daily as needed  Zinc 49m - 1 tablet daily  -Educated on Cost vs benefit of each product must be carefully weighed by individual consumer -Patient is satisfied with current therapy and denies issues -Recommended to continue current medication  Patient Goals/Self-Care Activities Patient will:  - focus on medication adherence by taking medications as prescribed check blood pressure daily, document, and provide at future appointments engage in dietary modifications by reducing high cholesterol food intake   Follow Up Plan: Telephone follow up appointment with care management team member scheduled for: The patient has been provided with contact information for the care management team and has been advised to call with any health related questions or concerns.   DTomasa Blase PharmD Clinical Pharmacist, [place]  Current chart prep = 2 minutes

## 2020-12-08 ENCOUNTER — Ambulatory Visit (INDEPENDENT_AMBULATORY_CARE_PROVIDER_SITE_OTHER): Payer: Medicare Other | Admitting: Internal Medicine

## 2020-12-08 ENCOUNTER — Encounter: Payer: Self-pay | Admitting: Internal Medicine

## 2020-12-08 ENCOUNTER — Other Ambulatory Visit: Payer: Self-pay

## 2020-12-08 DIAGNOSIS — N184 Chronic kidney disease, stage 4 (severe): Secondary | ICD-10-CM

## 2020-12-08 DIAGNOSIS — M545 Low back pain, unspecified: Secondary | ICD-10-CM | POA: Diagnosis not present

## 2020-12-08 DIAGNOSIS — I160 Hypertensive urgency: Secondary | ICD-10-CM | POA: Diagnosis not present

## 2020-12-08 DIAGNOSIS — G8929 Other chronic pain: Secondary | ICD-10-CM

## 2020-12-08 DIAGNOSIS — F4329 Adjustment disorder with other symptoms: Secondary | ICD-10-CM | POA: Insufficient documentation

## 2020-12-08 DIAGNOSIS — I10 Essential (primary) hypertension: Secondary | ICD-10-CM

## 2020-12-08 MED ORDER — HYDROCHLOROTHIAZIDE 25 MG PO TABS: 25.0000 mg | ORAL_TABLET | Freq: Every day | ORAL | 3 refills | Status: DC

## 2020-12-08 MED ORDER — HYDRALAZINE HCL 25 MG PO TABS: 25.0000 mg | ORAL_TABLET | Freq: Three times a day (TID) | ORAL | 3 refills | Status: DC | PRN
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
## 2020-12-08 NOTE — Assessment & Plan Note (Signed)
It is a difficult situation  Rx intolerance - will use Hadralazine prn high BP. Coreg - d/c'd - too tired Tolerating HCTZ ok - will monitor GFR
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
## 2020-12-08 NOTE — Assessment & Plan Note (Signed)
Continue w/Tramadol ER 200 mg/d  Potential benefits of a long term opioids use as well as potential risks (i.e. addiction risk, apnea etc) and complications (i.e. Somnolence, constipation and others) were explained to the patient and were aknowledged.

## 2020-12-08 NOTE — Assessment & Plan Note (Signed)
No relapse  - will use Hadralazine prn high BP. Coreg - d/c'd - too tired Tolerating HCTZ ok - will monitor GFR

## 2021-01-16 NOTE — Progress Notes (Deleted)
Cardiology Office Note    Date:  01/16/2021   ID:  Hayley Miller, DOB [birth year], MRN [id number]   PCP:  Cassandria Anger, MD   [facility]  Cardiologist:  Dorris Carnes, MD *** Advanced Practice Provider:  No care team member to display Electrophysiologist:  None   [phone]   No chief complaint on file.   History of Present Illness:  Hayley Miller is a 60 y.o. female with a hx of poorly controlled HTN, CAD anteroapical infarct w/ occluded distal LAD in 2006 that was treated w/ POBA. Last cath 06/2017 showed normal coronary anatomy and normal LVEDP. Symptoms felt due to elevated blood pressure. HLD, GERD and h/o GIB due to NSAID use in 2009    Admitted 11/2018 with CP. Echo showed normal LVEFof 50-55% and impaired relaxation pattern of LV diastolic filling. Apical septal segment, apex, and mid anteroseptal segment are abnormal. Symptoms felt due to hypertensive urgency. Recommended compliance. Renal artery ultra sound showed greater than 60% narrowing on R renal artery.   Patient last seen 12/2018 and BP soft so hydralazine reduced 75 mg TID.    Past Medical History:  Diagnosis Date  . Allergy   . Anemia   . Anxiety   . CAD (coronary artery disease)    a. s/p AL MI in 2006 tx with PTCA to LAD;  b. LHC (10/2009): lum irregs in LAD, o/w no CAD, EF 55-60%;  c. Myoview (07/2011):  no ischemia, EF 63%;  d. Echo (11/2011):  mild LVH, EF 60-65%, Gr 1 DD;  e.  Lexiscan Myoview (04/2013):  Inferior bowel atten artifact with inferoapical defect; no ischemia; EF 58% (low risk)  . Cameron lesion, acute   . Chronic lower back pain   . Chronic pain syndrome   . Daily headache   . Depression   . Erosive esophagitis   . Fibromyalgia   . GERD (gastroesophageal reflux disease)   . Hemorrhage    upper gastrointestional. GI bleed 10/09 due to NSAID  . Hiatal hernia   . HLD (hyperlipidemia)   . HTN (hypertension)   . Myocardial infarction ([place]) 2006 X 2  .  Rhinitis   . Seasonal allergies     Past Surgical History:  Procedure Laterality Date  . BLADDER SUSPENSION N/A 05/08/2013   Procedure: TRANSVAGINAL TAPE (TVT) PROCEDURE;  Surgeon: Olga Millers, MD;  Location: [facility];  Service: Gynecology;  Laterality: N/A;  . BREAST CYST EXCISION Left 2002  . CARDIAC CATHETERIZATION  07/19/2017  . COLONOSCOPY    . CORONARY ANGIOPLASTY  2006  . CYSTOSCOPY N/A 05/08/2013   Procedure: CYSTOSCOPY;  Surgeon: Olga Millers, MD;  Location: [facility];  Service: Gynecology;  Laterality: N/A;  . LAPAROSCOPIC ASSISTED VAGINAL HYSTERECTOMY N/A 05/08/2013   Procedure: LAPAROSCOPIC ASSISTED VAGINAL HYSTERECTOMY;  Surgeon: Olga Millers, MD;  Location: [facility];  Service: Gynecology;  Laterality: N/A;  . LAPAROSCOPIC BILATERAL SALPINGO OOPHERECTOMY N/A 05/08/2013   Procedure: LAPAROSCOPIC BILATERAL SALPINGO OOPHORECTOMY;  Surgeon: Olga Millers, MD;  Location: [facility];  Service: Gynecology;  Laterality: N/A;  . LEFT HEART CATH AND CORONARY ANGIOGRAPHY N/A 07/19/2017   Procedure: LEFT HEART CATH AND CORONARY ANGIOGRAPHY;  Surgeon: Martinique, Peter M, MD;  Location: [facility];  Service: Cardiovascular;  Laterality: N/A;  . UPPER GASTROINTESTINAL ENDOSCOPY      Current Medications: No outpatient medications have been marked as taking for the 01/21/21 encounter (Appointment) with Hayley Burn, PA-C.  Allergies:   Clonidine hydrochloride, Coreg [carvedilol], Gabapentin, Ibuprofen, Lovastatin, and Spironolactone   Social History   Socioeconomic History  . Marital status: Divorced    Spouse name: Not on file  . Number of children: 1  . Years of education: Not on file  . Highest education level: Not on file  Occupational History  . Occupation: unemployed    Fish farm manager: UNEMPLOYED  Tobacco Use  . Smoking status: Former    Packs/Lumb: 1.00    Years: 3.00    Pack years: 3.00    Types: Cigarettes    Quit date: 04/17/2017    Years since quitting: 3.7  .  Smokeless tobacco: Never  . Tobacco comments:    Divorced, lives with female domestic partner  Vaping Use  . Vaping Use: Never used  Substance and Sexual Activity  . Alcohol use: Not Currently    Comment: 07/20/2017 "might drink once or twice/year"  . Drug use: No  . Sexual activity: Yes    Birth control/protection: Other-see comments    Comment: has a female domestic partner  Other Topics Concern  . Not on file  Social History Narrative   Lives alone - divorced; 1 child.    Used to live with female domestic partner   Unemployed - disability.          Social Determinants of Health   Financial Resource Strain: Low Risk   . Difficulty of Paying Living Expenses: Not hard at all  Food Insecurity: Not on file  Transportation Needs: Not on file  Physical Activity: Not on file  Stress: Not on file  Social Connections: Not on file     Family History:  The patient's ***family history includes Colon cancer in her maternal aunt; Colon polyps in her mother; Diabetes in her maternal aunt; Heart disease in her maternal grandmother and son; Heart murmur in her mother; Hypertension in her mother.   ROS:   Please see the history of present illness.    ROS All other systems reviewed and are negative.   PHYSICAL EXAM:   VS:  LMP 06/07/2012   Physical Exam  GEN: Well nourished, well developed, in no acute distress  HEENT: normal  Neck: no JVD, carotid bruits, or masses Cardiac:RRR; no murmurs, rubs, or gallops  Respiratory:  clear to auscultation bilaterally, normal work of breathing GI: soft, nontender, nondistended, + BS Ext: without cyanosis, clubbing, or edema, Good distal pulses bilaterally MS: no deformity or atrophy  Skin: warm and dry, no rash Neuro:  Alert and Oriented x 3, Strength and sensation are intact Psych: euthymic mood, full affect  Wt Readings from Last 3 Encounters:  12/08/20 164 lb (74.4 kg)  10/29/20 162 lb 6.4 oz (73.7 kg)  04/30/20 155 lb 12.8 oz (70.7 kg)       Studies/Labs Reviewed:   EKG:  EKG is*** ordered today.  The ekg ordered today demonstrates ***  Recent Labs: 10/29/2020: ALT 22; BUN 25; Creatinine, Ser 1.90; Hemoglobin 14.0; Platelets 409.0; Potassium 3.7; Sodium 141; TSH 2.07   Lipid Panel    Component Value Date/Time   CHOL 209 (H) 04/02/2019 1646   TRIG 250.0 (H) 04/02/2019 1646   TRIG 444 11/14/2009 0000   HDL 40.70 04/02/2019 1646   CHOLHDL 5 04/02/2019 1646   VLDL 50.0 (H) 04/02/2019 1646   LDLCALC 101 (H) 12/14/2018 1620   LDLDIRECT 117.0 04/02/2019 1646    Additional studies/ records that were reviewed today include:  Echocardiogram: 11/2018 1. Left ventricular ejection fraction,  by visual estimation, is 50 to 55%. The left ventricle has mildly decreased function. There is no left ventricular hypertrophy.  2. Apical septal segment, apex, and mid anteroseptal segment are abnormal.  3. Left ventricular diastolic Doppler parameters are consistent with impaired relaxation pattern of LV diastolic filling.  4. Global right ventricle has normal systolic function.The right ventricular size is normal. No increase in right ventricular wall thickness.  5. Left atrial size was normal.  6. Right atrial size was normal.  7. The mitral valve is normal in structure. Mild mitral valve regurgitation.  8. The tricuspid valve is normal in structure. Tricuspid valve regurgitation is trivial.  9. The aortic valve is normal in structure. Aortic valve regurgitation was not visualized by color flow Doppler. 10. The pulmonic valve was grossly normal. Pulmonic valve regurgitation is not visualized by color flow Doppler. 11. Mildly elevated pulmonary artery systolic pressure. 12. The inferior vena cava is normal in size with greater than 50% respiratory variability, suggesting right atrial pressure of 3 mmHg.   LEFT HEART CATH AND CORONARY ANGIOGRAPHY   06/2017  Conclusion     The left ventricular systolic function is normal. LV end  diastolic pressure is normal. The left ventricular ejection fraction is 50-55% by visual estimate.   1. Normal coronary anatomy 2. Overall normal LV function. Localized akinesis of the inferior apex 3. Normal LVEDP   Plan: medical management including optimization of BP control.         Risk Assessment/Calculations:   {Does this patient have ATRIAL FIBRILLATION?:[phone]}     ASSESSMENT:    No diagnosis found.   PLAN:  In order of problems listed above:  CAD s/p angioplasty to dLAD 2006, last cath 06/2017 normal coronary anatomy  HTN  HLD   Renal artery stenosis  Shared Decision Making/Informed Consent   {Are you ordering a CV Procedure (e.g. stress test, cath, DCCV, TEE, etc)?   Press F2        :[id number]}    Medication Adjustments/Labs and Tests Ordered: Current medicines are reviewed at length with the patient today.  Concerns regarding medicines are outlined above.  Medication changes, Labs and Tests ordered today are listed in the Patient Instructions below. There are no Patient Instructions on file for this visit.   Sumner Boast, PA-C  01/16/2021 2:05 PM    [facility] [address], [place]  [address] Phone: [phone]; Fax: [phone]

## 2021-01-21 ENCOUNTER — Ambulatory Visit: Payer: Medicare Other | Admitting: Physician Assistant

## 2021-02-10 NOTE — Progress Notes (Signed)
Cardiology Office Note:    Date:  02/11/2021   ID:  Hayley Miller, DOB [birth year], MRN [id number]  PCP:  Cassandria Anger, MD   [facility] Providers Cardiologist:  Dorris Carnes, MD {   Referring MD: Cassandria Anger, MD   Chief Complaint: overdue 1 year follow-up of HTN, hyperlipidemia  History of Present Illness:    Hayley Miller is a 60 y.o. female with a hx of poorly controlled HTN, fibromyalgia, arthritis, tobacco abuse, and hyperlipidemia  She has hx of prior anteroapical infarct w/occluded distal LAD in 2006 that was treated w/ POBA. Last cath 06/2017 showed normal coronary anatomy and normal LVEDP. Symptoms felt due to elevated blood pressure. Admitted 11/2018 with CP. Echo showed normal LVEFof 50-55% and impaired relaxation pattern of LV diastolic filling. Apical septal segment, apex, and mid anteroseptal segment are abnormal. Symptoms felt due to hypertensive urgency and better compliance was recommended. She had a renal artery ultrasound 9/20 that showed greater than 60% narrowing on R renal artery and was referred to Dr. Gwenlyn Found for evaluation of potential renal vascular HTN. He felt that her renal artery stenosis of 60% in right renal artery was non-contributory to her accelerated HTN.  Today, she is here alone. She is overdue for follow-up, last seen in Oct 2020 and she needs medication refills. Her HR is elevated at 99 bpm. She reports coming to the doctor's office makes her anxious and she admits to drinking 3-4 glasses of sweet tea daily. It is unclear exactly which medications she is taking. She reports "that new medication is making me sweat more." States that she has increasing feelings of feeling overheated that have increased recently and are worse than she felt with menopause; thinks it may be HCTZ causing symptoms. Changes in medications have been made by PCP and also patient may have run out of some medications due to not coming in for yearly follow-up. She  has occasional pain under left breast that occurs randomly and lasts for a few seconds. She last felt this pain on 11/15 when she reached her left arm behind her to pick something up. Also reports pain in both forearms between her elbow and her wrist. She denies increase in pain with exertion and admits she has not been active recently. She would like to return to walking for exercise. Reports that her granddaughter has recorded her snoring and she wakes up feeling fatigued. Has daytime somnolence. Denies SOB, palpitations, dizziness, lightheadedness, syncope, edema, orthopnea, or PND.   Past Medical History:  Diagnosis Date   Allergy    Anemia    Anxiety    CAD (coronary artery disease)    a. s/p AL MI in 2006 tx with PTCA to LAD;  b. LHC (10/2009): lum irregs in LAD, o/w no CAD, EF 55-60%;  c. Myoview (07/2011):  no ischemia, EF 63%;  d. Echo (11/2011):  mild LVH, EF 60-65%, Gr 1 DD;  e.  Lexiscan Myoview (04/2013):  Inferior bowel atten artifact with inferoapical defect; no ischemia; EF 58% (low risk)   Cameron lesion, acute    Chronic lower back pain    Chronic pain syndrome    Daily headache    Depression    Erosive esophagitis    Fibromyalgia    GERD (gastroesophageal reflux disease)    Hemorrhage    upper gastrointestional. GI bleed 10/09 due to NSAID   Hiatal hernia    HLD (hyperlipidemia)    HTN (hypertension)    Myocardial infarction (  [place]) 2006 X 2   Rhinitis    Seasonal allergies     Past Surgical History:  Procedure Laterality Date   BLADDER SUSPENSION N/A 05/08/2013   Procedure: TRANSVAGINAL TAPE (TVT) PROCEDURE;  Surgeon: Olga Millers, MD;  Location: [facility];  Service: Gynecology;  Laterality: N/A;   BREAST CYST EXCISION Left 2002   CARDIAC CATHETERIZATION  07/19/2017   COLONOSCOPY     CORONARY ANGIOPLASTY  2006   CYSTOSCOPY N/A 05/08/2013   Procedure: CYSTOSCOPY;  Surgeon: Olga Millers, MD;  Location: [facility];  Service: Gynecology;  Laterality: N/A;   LAPAROSCOPIC  ASSISTED VAGINAL HYSTERECTOMY N/A 05/08/2013   Procedure: LAPAROSCOPIC ASSISTED VAGINAL HYSTERECTOMY;  Surgeon: Olga Millers, MD;  Location: [facility];  Service: Gynecology;  Laterality: N/A;   LAPAROSCOPIC BILATERAL SALPINGO OOPHERECTOMY N/A 05/08/2013   Procedure: LAPAROSCOPIC BILATERAL SALPINGO OOPHORECTOMY;  Surgeon: Olga Millers, MD;  Location: [facility];  Service: Gynecology;  Laterality: N/A;   LEFT HEART CATH AND CORONARY ANGIOGRAPHY N/A 07/19/2017   Procedure: LEFT HEART CATH AND CORONARY ANGIOGRAPHY;  Surgeon: Martinique, Peter M, MD;  Location: [facility];  Service: Cardiovascular;  Laterality: N/A;   UPPER GASTROINTESTINAL ENDOSCOPY      Current Medications: Current Meds  Medication Sig   acetaminophen (TYLENOL) 325 MG tablet Take 650 mg by mouth every 8 (eight) hours as needed for headache, moderate pain, mild pain or fever.   amLODipine (NORVASC) 10 MG tablet Take 1 tablet (10 mg total) by mouth daily.   aspirin EC 81 MG tablet Take 81 mg by mouth daily.   carvedilol (COREG) 12.5 MG tablet Take 1 tablet (12.5 mg total) by mouth 2 (two) times daily.   cetirizine (ZYRTEC) 10 MG tablet Take 1 tablet (10 mg total) by mouth daily. (Patient taking differently: Take 10 mg by mouth daily as needed.)   cyclobenzaprine (FLEXERIL) 5 MG tablet Take 1 tablet (5 mg total) by mouth at bedtime.   esomeprazole (NEXIUM) 40 MG capsule TAKE 1 CAPSULE AT 12:00 NOON EACH [place].   fluticasone (FLONASE) 50 MCG/ACT nasal spray USE 2 SPRAYS EACH NOSTRIL ONCE A [place] AS NEEDED FOR ALLERGIES OR CONGESTION.   hydrALAZINE (APRESOLINE) 25 MG tablet Take 1 tablet (25 mg total) by mouth 3 (three) times daily as needed. Take if BP>150/100   Lidocaine (LIDODERM EX) Apply 4 % topically daily. 12 hours on and 12 hours off   losartan (COZAAR) 100 MG tablet Take 100 mg by mouth daily. Take 1 by mouth daily   Multiple Vitamin (MULTIVITAMIN WITH MINERALS) TABS tablet Take 1 tablet by mouth daily.   nitroGLYCERIN  (NITROSTAT) 0.4 MG SL tablet Place 1 tablet (0.4 mg total) under the tongue every 5 (five) minutes as needed for chest pain.   pravastatin (PRAVACHOL) 40 MG tablet TAKE (1) TABLET DAILY AT BEDTIME.   Tetrahydrozoline HCl (VISINE OP) Place 1 drop into both eyes daily as needed (dry eyes).   traMADol (ULTRAM-ER) 200 MG 24 hr tablet Take 1 tablet (200 mg total) by mouth every morning.   [DISCONTINUED] hydrochlorothiazide (HYDRODIURIL) 25 MG tablet Take 1 tablet (25 mg total) by mouth daily. Take 1 by mouth daily     Allergies:   Clonidine hydrochloride, Coreg [carvedilol], Gabapentin, Ibuprofen, Lovastatin, and Spironolactone   Social History   Socioeconomic History   Marital status: Divorced    Spouse name: Not on file   Number of children: 1   Years of education: Not on file   Highest education  level: Not on file  Occupational History   Occupation: unemployed    Employer: UNEMPLOYED  Tobacco Use   Smoking status: Former    Packs/Sheer: 1.00    Years: 3.00    Pack years: 3.00    Types: Cigarettes    Quit date: 04/17/2017    Years since quitting: 3.8   Smokeless tobacco: Never   Tobacco comments:    Divorced, lives with female domestic partner  Vaping Use   Vaping Use: Never used  Substance and Sexual Activity   Alcohol use: Not Currently    Comment: 07/20/2017 "might drink once or twice/year"   Drug use: No   Sexual activity: Yes    Birth control/protection: Other-see comments    Comment: has a female domestic partner  Other Topics Concern   Not on file  Social History Narrative   Lives alone - divorced; 1 child.    Used to live with female domestic partner   Unemployed - disability.          Social Determinants of Health   Financial Resource Strain: Low Risk    Difficulty of Paying Living Expenses: Not hard at all  Food Insecurity: Not on file  Transportation Needs: Not on file  Physical Activity: Not on file  Stress: Not on file  Social Connections: Not on file      Family History: The patient's family history includes Colon cancer in her maternal aunt; Colon polyps in her mother; Diabetes in her maternal aunt; Heart disease in her maternal grandmother and son; Heart murmur in her mother; Hypertension in her mother. There is no history of Esophageal cancer, Rectal cancer, Stomach cancer, or Breast cancer.  ROS:   Please see the history of present illness.  All other systems reviewed and are negative.  Labs/Other Studies Reviewed:    The following studies were reviewed today:  Renal Artery U/S 9/20  Right: Evidence of a greater than 60% stenosis of the right renal         artery. Normal right Resisitive Index.  Left:  No evidence of left renal artery stenosis. Normal left         Resistive Index.    ECHO 9/20  1. Left ventricular ejection fraction, by visual estimation, is 50 to  55%. The left ventricle has mildly decreased function. There is no left  ventricular hypertrophy.   2. Apical septal segment, apex, and mid anteroseptal segment are  abnormal.   3. Left ventricular diastolic Doppler parameters are consistent with  impaired relaxation pattern of LV diastolic filling.   4. Global right ventricle has normal systolic function.The right  ventricular size is normal. No increase in right ventricular wall  thickness.   5. Left atrial size was normal.   6. Right atrial size was normal.   7. The mitral valve is normal in structure. Mild mitral valve  regurgitation.   8. The tricuspid valve is normal in structure. Tricuspid valve  regurgitation is trivial.   9. The aortic valve is normal in structure. Aortic valve regurgitation  was not visualized by color flow Doppler.  10. The pulmonic valve was grossly normal. Pulmonic valve regurgitation is  not visualized by color flow Doppler.  11. Mildly elevated pulmonary artery systolic pressure.  12. The inferior vena cava is normal in size with greater than 50%  respiratory variability,  suggesting right atrial pressure of 3 mmHg  LHC 4/19  1. Normal coronary anatomy 2. Overall normal LV function. Localized akinesis of the  inferior apex 3. Normal LVEDP  Plan: medical management including optimization of BP control.    Recent Labs: 10/29/2020: ALT 22; BUN 25; Creatinine, Ser 1.90; Hemoglobin 14.0; Platelets 409.0; Potassium 3.7; Sodium 141; TSH 2.07  Recent Lipid Panel    Component Value Date/Time   CHOL 209 (H) 04/02/2019 1646   TRIG 250.0 (H) 04/02/2019 1646   TRIG 444 11/14/2009 0000   HDL 40.70 04/02/2019 1646   CHOLHDL 5 04/02/2019 1646   VLDL 50.0 (H) 04/02/2019 1646   LDLCALC 101 (H) 12/14/2018 1620   LDLDIRECT 117.0 04/02/2019 1646   STOP-Bang Score:  4       Physical Exam:    VS:  BP 122/88   Pulse 98   Ht 5\' 4"  (1.626 m)   Wt 166 lb (75.3 kg)   LMP 06/07/2012   SpO2 98%   BMI 28.49 kg/m     Wt Readings from Last 3 Encounters:  02/11/21 166 lb (75.3 kg)  12/08/20 164 lb (74.4 kg)  10/29/20 162 lb 6.4 oz (73.7 kg)     GEN:  Well nourished, well developed in no acute distress HEENT: Normal NECK: No JVD; No carotid bruits LYMPHATICS: No lymphadenopathy CARDIAC: RRR, no murmurs, rubs, gallops RESPIRATORY:  Clear to auscultation without rales, wheezing or rhonchi  ABDOMEN: Soft, non-tender, non-distended MUSCULOSKELETAL:  No edema; No deformity  SKIN: Warm and dry NEUROLOGIC:  Alert and oriented x 3 PSYCHIATRIC:  Normal affect   EKG:  EKG is ordered today.  The ekg ordered today demonstrates sinus tachycardia at rate of 99 bpm. No ST/T wave abnormalities.   Assessment and Plan:      1. HTN: BP is well-controlled today, however it is not clear which medications she is taking due to changes made by PCP and prescriptions that were not refilled due to her lack of follow-up. She has hydralazine 100 mg that she takes if diastolic BP is > 465; reports she rarely takes it. She reports increased hot flashes and sweating since starting HCTZ. She was  previously on carvedilol and spironolactone but reported fatigue with those so both were stopped. We will d/c hctz and see if she can tolerate a lower dose of carvedilol since she also has tachycardia. Advised her to continue to monitor BP and follow-up in 4-6 weeks with hypertension clinic.   2. CAD s/p occluded distal LAD in 2006 that was treated w/ POBA. Reports pain under left breast that lasts for a few seconds. It occurs randomly and does not intensify with exertion. She felt it most recently on 02/10/21 when she was reaching for an object. States she thinks it may be muscular pain and that when she reported same pain to Dr. Harrington Challenger in the past, Dr. Harrington Challenger agreed. No indication for further ischemic evaluation today. Restart carvedilol. Continue aspirin, statin.  3. Sinus tachycardia: She states she gets anxious coming into appointments. TSH 2.070 August 2022, will recheck today. Denies SOB, palpitations. No concerns for infection or PE. She has been off carvedilol for an unknown amount of time, she thinks due to fatigue. Would like to restart at a lower dose. Encouraged her to decrease intake of caffeine and increase water intake.   4. Hyperlipidemia: LDL 117 in Sept. 2020. On pravastatin. Will repeat lipids today. She has lovastatin listed as an intolerance due to joint pain, but if LDL remains elevated, would like to have her try rosuvastatin or atorvastatin.   4. Renal artery stenosis: She was evaluated by Dr. Gwenlyn Found for  60% renal artery stenosis in 2020. Her BP is well-controlled today. No further evaluation indicated at this time.   5. CKD stage 4: We will recheck kidney function and electrolytes today. Discontinuing HCTZ due to intolerance. In the setting of CKD, would favor getting patient back on carvedilol for HTN management.   6. Sleep apnea: She has daytime somnolence and her granddaughter has recorded her snoring. She wakes up feeling fatigued. Will order home sleep test and refer to sleep  medicine if test reveals OSA.    Medication Adjustments/Labs and Tests Ordered: Current medicines are reviewed at length with the patient today.  Concerns regarding medicines are outlined above.   Orders Placed This Encounter  Procedures   Basic metabolic panel   Hepatic function panel   Lipid panel   TSH   AMB REFERRAL TO ADVANCED HTN CLINIC   EKG 12-Lead   Itamar Sleep Study   Meds ordered this encounter  Medications   carvedilol (COREG) 12.5 MG tablet    Sig: Take 1 tablet (12.5 mg total) by mouth 2 (two) times daily.    Dispense:  180 tablet    Refill:  3    Patient Instructions  Medication Instructions:  1.Stop hydrochlorothiazide (HCTZ) 2.Start carvedilol 12.5 mg, take one tablet by mouth twice a [place] *If you need a refill on your cardiac medications before your next appointment, please call your pharmacy*   Lab Work: BMET, Lft's, lipids and TSH today If you have labs (blood work) drawn today and your tests are completely normal, you will receive your results only by: [place] (if you have MyChart) OR A paper copy in the mail If you have any lab test that is abnormal or we need to change your treatment, we will call you to review the results.    Follow-Up: At [facility], you and your health needs are our priority.  As part of our continuing mission to provide you with exceptional heart care, we have created designated Provider Care Teams.  These Care Teams include your primary Cardiologist (physician) and Advanced Practice Providers (APPs -  Physician Assistants and Nurse Practitioners) who all work together to provide you with the care you need, when you need it.  Your next appointment:   6 month(s)  The format for your next appointment:   In Person  Provider:   Dorris Carnes, MD {   Other Instructions You have been referred to the hypertension clinic.     Signed, Emmaline Life, NP  02/11/2021 3:28 PM    [facility]

## 2021-02-11 ENCOUNTER — Other Ambulatory Visit: Payer: Self-pay

## 2021-02-11 ENCOUNTER — Ambulatory Visit (INDEPENDENT_AMBULATORY_CARE_PROVIDER_SITE_OTHER): Payer: Medicare Other | Admitting: Nurse Practitioner

## 2021-02-11 ENCOUNTER — Encounter: Payer: Self-pay | Admitting: Physician Assistant

## 2021-02-11 VITALS — BP 122/88 | HR 98 | Ht 64.0 in | Wt 166.0 lb

## 2021-02-11 DIAGNOSIS — R079 Chest pain, unspecified: Secondary | ICD-10-CM | POA: Diagnosis not present

## 2021-02-11 DIAGNOSIS — N184 Chronic kidney disease, stage 4 (severe): Secondary | ICD-10-CM

## 2021-02-11 DIAGNOSIS — I2510 Atherosclerotic heart disease of native coronary artery without angina pectoris: Secondary | ICD-10-CM | POA: Diagnosis not present

## 2021-02-11 DIAGNOSIS — I10 Essential (primary) hypertension: Secondary | ICD-10-CM | POA: Diagnosis not present

## 2021-02-11 DIAGNOSIS — R400 Somnolence: Secondary | ICD-10-CM | POA: Diagnosis not present

## 2021-02-11 DIAGNOSIS — I214 Non-ST elevation (NSTEMI) myocardial infarction: Secondary | ICD-10-CM | POA: Diagnosis not present

## 2021-02-11 MED ORDER — CARVEDILOL 12.5 MG PO TABS: 12.5000 mg | ORAL_TABLET | Freq: Two times a day (BID) | ORAL | 3 refills | Status: DC

## 2021-02-11 NOTE — Patient Instructions (Addendum)
Medication Instructions:  1.Stop hydrochlorothiazide (HCTZ) 2.Start carvedilol 12.5 mg, take one tablet by mouth twice a [place] *If you need a refill on your cardiac medications before your next appointment, please call your pharmacy*   Lab Work: BMET, Lft's, lipids and TSH today If you have labs (blood work) drawn today and your tests are completely normal, you will receive your results only by: [place] (if you have MyChart) OR A paper copy in the mail If you have any lab test that is abnormal or we need to change your treatment, we will call you to review the results.    Follow-Up: At [facility], you and your health needs are our priority.  As part of our continuing mission to provide you with exceptional heart care, we have created designated Provider Care Teams.  These Care Teams include your primary Cardiologist (physician) and Advanced Practice Providers (APPs -  Physician Assistants and Nurse Practitioners) who all work together to provide you with the care you need, when you need it.  Your next appointment:   6 month(s)  The format for your next appointment:   In Person  Provider:   Dorris Carnes, MD {   Other Instructions You have been referred to the hypertension clinic.

## 2021-02-12 ENCOUNTER — Telehealth: Payer: Self-pay | Admitting: *Deleted

## 2021-02-12 DIAGNOSIS — E782 Mixed hyperlipidemia: Secondary | ICD-10-CM

## 2021-02-12 LAB — BASIC METABOLIC PANEL
BUN/Creatinine Ratio: 16 (ref 9–23)
BUN: 21 mg/dL (ref 6–24)
CO2: 23 mmol/L (ref 20–29)
Calcium: 10.5 mg/dL — ABNORMAL HIGH (ref 8.7–10.2)
Chloride: 102 mmol/L (ref 96–106)
Creatinine, Ser: 1.35 mg/dL — ABNORMAL HIGH (ref 0.57–1.00)
Glucose: 96 mg/dL (ref 70–99)
Potassium: 3.5 mmol/L (ref 3.5–5.2)
Sodium: 141 mmol/L (ref 134–144)
eGFR: 46 mL/min/{1.73_m2} — ABNORMAL LOW (ref >59)

## 2021-02-12 LAB — HEPATIC FUNCTION PANEL
ALT: 24 IU/L (ref 0–32)
AST: 23 IU/L (ref 0–40)
Albumin: 4.6 g/dL (ref 3.8–4.9)
Alkaline Phosphatase: 91 IU/L (ref 44–121)
Bilirubin Total: <0.2 mg/dL (ref 0.0–1.2)
Bilirubin, Direct: <0.1 mg/dL (ref 0.00–0.40)
Total Protein: 7.5 g/dL (ref 6.0–8.5)

## 2021-02-12 LAB — LIPID PANEL
Chol/HDL Ratio: 4.7 ratio — ABNORMAL HIGH (ref 0.0–4.4)
Cholesterol, Total: 198 mg/dL (ref 100–199)
HDL: 42 mg/dL (ref >39)
LDL Chol Calc (NIH): 122 mg/dL — ABNORMAL HIGH (ref 0–99)
Triglycerides: 194 mg/dL — ABNORMAL HIGH (ref 0–149)
VLDL Cholesterol Cal: 34 mg/dL (ref 5–40)

## 2021-02-12 LAB — TSH: TSH: 3.09 u[IU]/mL (ref 0.450–4.500)

## 2021-02-12 MED ORDER — ROSUVASTATIN CALCIUM 20 MG PO TABS: 20.0000 mg | ORAL_TABLET | Freq: Every day | ORAL | 3 refills | Status: DC

## 2021-02-12 NOTE — Telephone Encounter (Signed)
Spoke with Christen Bame, NP and she advised pt to have Lipid and ALT in 6-8 weeks if she is agreeable to changing medications.  Spoke with pt and reviewed results and recommendations and she is agreeable to med change.  Sent Rosuvastatin to preferred pharmacy.  Pt will come to the office 04/02/21 for labs.  Advised pt if she develops muscle aches or has other issues, please let us know sooner.  Pt verbalized understanding and was appreciative for call.

## 2021-02-12 NOTE — Telephone Encounter (Signed)
-----   Message from Emmaline Life, NP sent at 02/12/2021  8:28 AM EST ----- TSH and liver function tests are normal. Triglycerides and LDL are elevated. LDL goal for her is 70 due to history of CAD. She is currently on pravastatin, is she willing to try atorvastatin 40 mg or rosuvastatin 20 mg? We discussed decreasing intake of high fat foods and simple carbohydrates including sweet tea, breads, pastas, heavy cream, and fried foods. Creatinine is improved from previous. We have asked her to stop HCTZ and restart carvedilol which should help with kidney function. Keep appointment with HTN clinic in a few weeks.

## 2021-02-13 ENCOUNTER — Telehealth: Payer: Self-pay | Admitting: *Deleted

## 2021-02-13 NOTE — Telephone Encounter (Signed)
-----   Message from Lauralee Evener, [place] sent at 02/13/2021  3:35 PM EST ----- Regarding: RE: Itamar Study Per [employer] web portal no PA is required. ----- Message ----- From: Michae Kava, CMA Sent: 02/11/2021   2:08 PM EST To: Michae Kava, CMA, [facility] Sleep Studies Subject: Itamar Study                                   Hi ladies,  Ermalinda Barrios, [facility] has ordered Itamar study. If someone would please check for authorization I would appreciate it.   Thank you  Arbie Cookey

## 2021-02-13 NOTE — Telephone Encounter (Signed)
I s/w the pt and she is aware ok to proceed with Itamar sleep study. Pt will do study over the weekend. Answered all questions pt had in regard to sleep study. Pt aware once study is completed we will call with results and recommendations if any. Pt thanked me for the call and the help. PIN [phone] has been given to the pt.

## 2021-02-15 ENCOUNTER — Encounter (INDEPENDENT_AMBULATORY_CARE_PROVIDER_SITE_OTHER): Payer: Medicare Other | Admitting: Cardiology

## 2021-02-15 DIAGNOSIS — G4733 Obstructive sleep apnea (adult) (pediatric): Secondary | ICD-10-CM

## 2021-02-22 ENCOUNTER — Ambulatory Visit: Payer: Medicare Other

## 2021-02-22 DIAGNOSIS — R400 Somnolence: Secondary | ICD-10-CM

## 2021-02-22 NOTE — Procedures (Signed)
   Sleep Study Report  Patient Information Study Date: 02/15/21 Patient Name: Hayley Miller Patient ID: [id number] Birth Date: [birth year] Age: 60 Gender: Female BMI: 28.2 (W=165 lb, H=5' 4'') Neck Circ.: 14 '' Referring Physician: Dorris Carnes, MD  TEST DESCRIPTION: Home sleep apnea testing was completed using the WatchPat, a Type 1 device, utilizing peripheral arterial tonometry (PAT), chest movement, actigraphy, pulse oximetry, pulse rate, body position and snore. AHI was calculated with apnea and hypopnea using valid sleep time as the denominator. RDI includes apneas, hypopneas, and RERAs. The data acquired and the scoring of sleep and all associated events were performed in accordance with the recommended standards and specifications as outlined in the AASM Manual for the Scoring of Sleep and Associated Events 2.2.0 (2015).  FINDINGS: 1. Severe Obstructive Sleep Apnea with AHI 29.3/hr. 2. No Central Sleep Apnea with pAHIc 0.4/hr. 3. Oxygen desaturations as low as 80%. 4. Severe snoring was present. O2 sats were < 88% for 9.9 min. 5. Total sleep time was 4 hrs and 38 min. 6. 19.4% of total sleep time was spent in REM sleep. 7. Prolonged sleep onset latency at 50 min. 8. Prolonged REM sleep onset latency at 229 min. 9. Total awakenings were18 .  DIAGNOSIS: Severe Obstructive Sleep Apnea (G47.33) Nocturnal Hypoxemia  RECOMMENDATIONS: 1. Clinical correlation of these findings is necessary. The decision to treat obstructive sleep apnea (OSA) is usually based on the presence of apnea symptoms or the presence of associated medical conditions such as Hypertension, Congestive Heart Failure, Atrial Fibrillation or Obesity. The most common symptoms of OSA are snoring, gasping for breath while sleeping, daytime sleepiness and fatigue.  2. Initiating apnea therapy is recommended given the presence of symptoms and/or associated conditions. Recommend proceeding with one of the  following:   a. Auto-CPAP therapy with a pressure range of 5-20cm H2O.   b. An oral appliance (OA) that can be obtained from certain dentists with expertise in sleep medicine. These are primarily of use in non-obese patients with mild and moderate disease.   c. An ENT consultation which may be useful to look for specific causes of obstruction and possible treatment options.   d. If patient is intolerant to PAP therapy, consider referral to ENT for evaluation for hypoglossal nerve stimulator.  3. Close follow-up is necessary to ensure success with CPAP or oral appliance therapy for maximum benefit .  4. A follow-up oximetry study on CPAP is recommended to assess the adequacy of therapy and determine the need for supplemental oxygen or the potential need for Bi-level therapy. An arterial blood gas to determine the adequacy of baseline ventilation and oxygenation should also be considered.  5. Healthy sleep recommendations include: adequate nightly sleep (normal 7-9 hrs/night), avoidance of caffeine after noon and alcohol near bedtime, and maintaining a sleep environment that is cool, dark and quiet.  6. Weight loss for overweight patients is recommended. Even modest amounts of weight loss can significantly improve the severity of sleep apnea.  7. Snoring recommendations include: weight loss where appropriate, side sleeping, and avoidance of alcohol before bed.  8. Operation of motor vehicle should be avoided when sleepy.  Signature: Electronically Signed: 02/22/21 Fransico Him, MD; [facility]; [place], American Board of Sleep Medicine

## 2021-02-24 ENCOUNTER — Telehealth: Payer: Self-pay | Admitting: *Deleted

## 2021-02-24 DIAGNOSIS — G4733 Obstructive sleep apnea (adult) (pediatric): Secondary | ICD-10-CM

## 2021-02-24 NOTE — Telephone Encounter (Signed)
-----   Message from Sueanne Margarita, MD sent at 02/22/2021  5:40 PM EST ----- Please let patient know that they have sleep apnea.  Recommend therapeutic CPAP titration for treatment of patient's sleep disordered breathing.  If unable to perform an in lab titration then initiate ResMed auto CPAP from 4 to 15cm H2O with heated humidity and mask of choice and overnight pulse ox on CPAP.

## 2021-03-05 ENCOUNTER — Ambulatory Visit: Payer: Medicare Other

## 2021-03-05 NOTE — Progress Notes (Deleted)
Patient ID: Hayley Miller                 DOB: 02-23-1961                      MRN: [id number]     HPI: Hayley Miller is a 60 y.o. female patient of Dr. Harrington Challenger referred by Christen Bame to HTN clinic. PMH is significant for poorly controlled HTN, fibromyalgia, arthritis, tobacco abuse, and hyperlipidemia.  Current HTN meds:  Previously tried:  BP goal:   Family History:   Social History:   Diet:   Exercise:   Home BP readings:   Wt Readings from Last 3 Encounters:  02/11/21 166 lb (75.3 kg)  12/08/20 164 lb (74.4 kg)  10/29/20 162 lb 6.4 oz (73.7 kg)   BP Readings from Last 3 Encounters:  02/11/21 122/88  12/08/20 128/90  11/17/20 122/88   Pulse Readings from Last 3 Encounters:  02/11/21 98  12/08/20 97  11/17/20 98    Renal function: CrCl cannot be calculated (Patient's most recent lab result is older than the maximum 21 days allowed.).  Past Medical History:  Diagnosis Date   Allergy    Anemia    Anxiety    CAD (coronary artery disease)    a. s/p AL MI in 2006 tx with PTCA to LAD;  b. LHC (10/2009): lum irregs in LAD, o/w no CAD, EF 55-60%;  c. Myoview (07/2011):  no ischemia, EF 63%;  d. Echo (11/2011):  mild LVH, EF 60-65%, Gr 1 DD;  e.  Lexiscan Myoview (04/2013):  Inferior bowel atten artifact with inferoapical defect; no ischemia; EF 58% (low risk)   [place]on lesion, acute    Chronic lower back pain    Chronic pain syndrome    Daily headache    Depression    Erosive esophagitis    Fibromyalgia    GERD (gastroesophageal reflux disease)    Hemorrhage    upper gastrointestional. GI bleed 10/09 due to NSAID   Hiatal hernia    HLD (hyperlipidemia)    HTN (hypertension)    Myocardial infarction ([place]) 2006 X 2   Rhinitis    Seasonal allergies     Current Outpatient Medications on File Prior to Visit  Medication Sig Dispense Refill   acetaminophen (TYLENOL) 325 MG tablet Take 650 mg by mouth every 8 (eight) hours as needed for headache, moderate  pain, mild pain or fever.     amLODipine (NORVASC) 10 MG tablet Take 1 tablet (10 mg total) by mouth daily. 90 tablet 3   aspirin EC 81 MG tablet Take 81 mg by mouth daily.     carvedilol (COREG) 12.5 MG tablet Take 1 tablet (12.5 mg total) by mouth 2 (two) times daily. 180 tablet 3   cetirizine (ZYRTEC) 10 MG tablet Take 1 tablet (10 mg total) by mouth daily. (Patient taking differently: Take 10 mg by mouth daily as needed.) 30 tablet 0   cyclobenzaprine (FLEXERIL) 5 MG tablet Take 1 tablet (5 mg total) by mouth at bedtime. 90 tablet 1   esomeprazole (NEXIUM) 40 MG capsule TAKE 1 CAPSULE AT 12:00 NOON EACH [place]. 90 capsule 3   fluticasone (FLONASE) 50 MCG/ACT nasal spray USE 2 SPRAYS EACH NOSTRIL ONCE A [place] AS NEEDED FOR ALLERGIES OR CONGESTION. 16 g 0   hydrALAZINE (APRESOLINE) 25 MG tablet Take 1 tablet (25 mg total) by mouth 3 (three) times daily as needed. Take if BP>150/100 270 tablet 3  Lidocaine (LIDODERM EX) Apply 4 % topically daily. 12 hours on and 12 hours off     losartan (COZAAR) 100 MG tablet Take 100 mg by mouth daily. Take 1 by mouth daily     Multiple Vitamin (MULTIVITAMIN WITH MINERALS) TABS tablet Take 1 tablet by mouth daily.     nitroGLYCERIN (NITROSTAT) 0.4 MG SL tablet Place 1 tablet (0.4 mg total) under the tongue every 5 (five) minutes as needed for chest pain. 20 tablet 2   rosuvastatin (CRESTOR) 20 MG tablet Take 1 tablet (20 mg total) by mouth daily. 90 tablet 3   Tetrahydrozoline HCl (VISINE OP) Place 1 drop into both eyes daily as needed (dry eyes).     traMADol (ULTRAM-ER) 200 MG 24 hr tablet Take 1 tablet (200 mg total) by mouth every morning. 30 tablet 2   No current facility-administered medications on file prior to visit.    Allergies  Allergen Reactions   Clonidine Hydrochloride Other (See Comments)    REACTION: FATIGUE   Coreg [Carvedilol]     fatigue   Gabapentin     Too sleepy   Ibuprofen Other (See Comments)    GI bleed   Lovastatin Other (See  Comments)    Joint pain   Spironolactone     fatigue    Last menstrual period 06/07/2012.   Assessment/Plan:  1. Hypertension -    Thank you  Ramond Dial, Pharm.D, BCPS, CPP [facility]  [address], [place], [place] [address]  Phone: [phone]; Fax: [phone]

## 2021-03-16 ENCOUNTER — Telehealth: Payer: Self-pay

## 2021-03-16 NOTE — Telephone Encounter (Signed)
Letter has been sent to patient instructing them to call us if they are still interested in completing their sleep study. If we have not received a response from the patient within 30 days of this notice, the order will be cancelled and they will need to discuss the need for a sleep study at their next office visit.  ° °

## 2021-03-17 NOTE — Telephone Encounter (Addendum)
The patient has been notified of the result. Left detailed message on voicemail and informed patient to call back with questions.Hayley Miller, [place] 03/17/2021 20:91 AM    Precert titr

## 2021-03-24 ENCOUNTER — Telehealth: Payer: Self-pay | Admitting: Cardiology

## 2021-03-24 ENCOUNTER — Other Ambulatory Visit: Payer: Self-pay

## 2021-03-24 ENCOUNTER — Other Ambulatory Visit: Payer: Self-pay | Admitting: Internal Medicine

## 2021-03-24 NOTE — Telephone Encounter (Signed)
Call transferred to sleep coordinator (CMA) to reschedule CPAP titration appointment (04/21/21 at 8:00 AM with Dr. Radford Pax).

## 2021-03-25 NOTE — Telephone Encounter (Signed)
Prior Authorization for titration sent to [facility] via web portal.   do not require Pre-Authorization by AIM.

## 2021-03-26 NOTE — Telephone Encounter (Signed)
Called and left message to call sleep lab to reschedule at [phone].

## 2021-03-30 ENCOUNTER — Other Ambulatory Visit: Payer: Self-pay | Admitting: Internal Medicine

## 2021-04-02 ENCOUNTER — Telehealth: Payer: Self-pay | Admitting: Internal Medicine

## 2021-04-02 MED ORDER — ESOMEPRAZOLE MAGNESIUM 40 MG PO CPDR: DELAYED_RELEASE_CAPSULE | ORAL | 3 refills | Status: DC

## 2021-04-02 NOTE — Telephone Encounter (Signed)
Refilled medication

## 2021-04-02 NOTE — Telephone Encounter (Signed)
1.Medication Requested: esomeprazole (NEXIUM) 40 MG capsule  2. Pharmacy (Name, Street, [facility]): [place], [address]  3. On Med List: yes   4. Last Visit with PCP: 12-08-2020  5. Next visit date with PCP: n/a   Agent: Please be advised that RX refills may take up to 3 business days. We ask that you follow-up with your pharmacy.

## 2021-04-21 ENCOUNTER — Ambulatory Visit (HOSPITAL_BASED_OUTPATIENT_CLINIC_OR_DEPARTMENT_OTHER): Payer: Medicare Other | Attending: Cardiology | Admitting: Cardiology

## 2021-04-21 ENCOUNTER — Other Ambulatory Visit: Payer: Self-pay | Admitting: Internal Medicine

## 2021-04-21 ENCOUNTER — Other Ambulatory Visit: Payer: Self-pay

## 2021-04-21 DIAGNOSIS — G4736 Sleep related hypoventilation in conditions classified elsewhere: Secondary | ICD-10-CM | POA: Diagnosis not present

## 2021-04-21 DIAGNOSIS — G4733 Obstructive sleep apnea (adult) (pediatric): Secondary | ICD-10-CM | POA: Insufficient documentation

## 2021-04-21 DIAGNOSIS — I493 Ventricular premature depolarization: Secondary | ICD-10-CM | POA: Insufficient documentation

## 2021-04-22 NOTE — Procedures (Signed)
° °  Patient Name: Hayley Miller, Hayley Miller Date: 04/21/2021 Gender: Female D.O.B: [birth year] Age (years): 69 Referring Provider: Fransico Him MD, ABSM Height (inches): 64 Interpreting Physician: Fransico Him MD, ABSM Weight (lbs): 158 RPSGT: Carolin Coy BMI: 27 MRN: [id number] Neck Size: 13.50  CLINICAL INFORMATION The patient is referred for a CPAP titration to treat sleep apnea.  SLEEP STUDY TECHNIQUE As per the AASM Manual for the Scoring of Sleep and Associated Events v2.3 (April 2016) with a hypopnea requiring 4% desaturations.  The channels recorded and monitored were frontal, central and occipital EEG, electrooculogram (EOG), submentalis EMG (chin), nasal and oral airflow, thoracic and abdominal wall motion, anterior tibialis EMG, snore microphone, electrocardiogram, and pulse oximetry. Continuous positive airway pressure (CPAP) was initiated at the beginning of the study and titrated to treat sleep-disordered breathing.  MEDICATIONS Medications self-administered by patient taken the night of the study : ZYRTEC, FLEXERIL, AMLODIPINE, COREG, HYDRALAZINE, TRAMADOL, CRESTOR, NEXIUM, FLONASE  TECHNICIAN COMMENTS Comments added by technician: Patient was ordered as a Cpap titration. Comments added by scorer: N/A  RESPIRATORY PARAMETERS Optimal PAP Pressure (cm): 13  AHI at Optimal Pressure (/hr):0 Overall Minimal O2 (%):85.0  Supine % at Optimal Pressure (%):0 Minimal O2 at Optimal Pressure (%): 90.0   SLEEP ARCHITECTURE The study was initiated at 10:33:21 PM and ended at 4:53:48 AM.  Sleep onset time was 142.8 minutes and the sleep efficiency was 53.8%. The total sleep time was 204.5 minutes.  The patient spent 14.9% of the night in stage N1 sleep, 73.8% in stage N2 sleep, 0.0% in stage N3 and 11.3% in REM.Stage REM latency was 211.5 minutes  Wake after sleep onset was 33.2. Alpha intrusion was absent. Supine sleep was 23.72%.  CARDIAC DATA The 2 lead EKG demonstrated  sinus rhythm. The mean heart rate was 73.2 beats per minute. Other EKG findings include: PVCs.  LEG MOVEMENT DATA The total Periodic Limb Movements of Sleep (PLMS) were 0. The PLMS index was 0.0. A PLMS index of <15 is considered normal in adults.  IMPRESSIONS - The optimal PAP pressure was 13 cm of water. - Moderate oxygen desaturations were observed during this titration (min O2 = 85.0%). - The patient snored with moderate snoring volume during this titration study. - 2-lead EKG demonstrated: PVCs - Clinically significant periodic limb movements were not noted during this study. Arousals associated with PLMs were rare.  DIAGNOSIS - Obstructive Sleep Apnea (G47.33) - Nocturnal Hypoxemia  RECOMMENDATIONS - Trial of CPAP therapy on 13 cm H2O with a Medium size Resmed Full Face Mask AirFit F30i mask and heated humidification. - Avoid alcohol, sedatives and other CNS depressants that may worsen sleep apnea and disrupt normal sleep architecture. - Sleep hygiene should be reviewed to assess factors that may improve sleep quality. - Weight management and regular exercise should be initiated or continued. - Return to Sleep Center for re-evaluation after 6 weeks of therap  [Electronically signed] 04/22/2021 03:14 PM  Fransico Him MD, ABSM Diplomate, American Board of Sleep Medicine

## 2021-05-04 ENCOUNTER — Telehealth: Payer: Self-pay | Admitting: *Deleted

## 2021-05-04 NOTE — Telephone Encounter (Signed)
The patient has been notified of the result and Left detailed message on voicemail and informed patient to call back.  Hayley Miller, [name] 05/04/2021 5:40 PM    Upon patient request DME selection is [name] Patient understands he will be contacted by [name] to set up his cpap. Patient understands to call if [name] does not contact him with new setup in a timely manner. Patient understands they will be called once confirmation has been received from [name]/ that they have received their new machine to schedule 10 week follow up appointment.   Annabella notified of new cpap order  Please add to airview Patient was grateful for the call and thanked me

## 2021-05-04 NOTE — Telephone Encounter (Signed)
-----   Message from Cleon Gustin, [place] sent at 04/22/2021  7:07 PM EST -----  ----- Message ----- From: Sueanne Margarita, MD Sent: 04/22/2021   3:16 PM EST To: Cv Div Sleep Studies  Please let patient know that they had a successful PAP titration and let DME know that orders are in EPIC.  Please set up 6 week OV with me.

## 2021-05-27 ENCOUNTER — Telehealth: Payer: Self-pay

## 2021-05-27 NOTE — Progress Notes (Signed)
? ? ?  Chronic Care Management ?Pharmacy Assistant  ? ?Name: Hayley Miller  MRN: [id number] DOB: November 11, 1960 ? ?Hayley Miller is an 61 y.o. year old female who presents for his follow-up CCM visit with the clinical pharmacist. ? ?Reason for Encounter: Disease State-General ?  ? ?Recent office visits:  ?02/11/21 Emmaline Life, NP (Daytime sleepiness) Blood work ordered, medication changes: Stop hydrochlorothiazide and Start carvedilol 12.5 mg ? ?12/08/20 Plotnikov, Evie Lacks, MD-PCP (Chronic kidney disease) Med changes: Hadralazine prn high BP. Coreg - d/c'd - too tired ? ?Recent consult visits:  ?None ID ? ?Hospital visits:  ?None in previous 6 months ? ?Medications: ?Outpatient Encounter Medications as of 05/27/2021  ?Medication Sig  ? acetaminophen (TYLENOL) 325 MG tablet Take 650 mg by mouth every 8 (eight) hours as needed for headache, moderate pain, mild pain or fever.  ? amLODipine (NORVASC) 10 MG tablet Take 1 tablet (10 mg total) by mouth daily.  ? aspirin EC 81 MG tablet Take 81 mg by mouth daily.  ? carvedilol (COREG) 12.5 MG tablet Take 1 tablet (12.5 mg total) by mouth 2 (two) times daily.  ? cetirizine (ZYRTEC) 10 MG tablet Take 1 tablet (10 mg total) by mouth daily. (Patient taking differently: Take 10 mg by mouth daily as needed.)  ? cyclobenzaprine (FLEXERIL) 5 MG tablet Take 1 tablet (5 mg total) by mouth at bedtime.  ? esomeprazole (NEXIUM) 40 MG capsule Take 1 capsule at 12:00 noon each [place].  ? fluticasone (FLONASE) 50 MCG/ACT nasal spray USE 2 SPRAYS EACH NOSTRIL ONCE A [place] AS NEEDED FOR ALLERGIES OR CONGESTION.  ? hydrALAZINE (APRESOLINE) 25 MG tablet Take 1 tablet (25 mg total) by mouth 3 (three) times daily as needed. Take if BP>150/100  ? Lidocaine (LIDODERM EX) Apply 4 % topically daily. 12 hours on and 12 hours off  ? losartan (COZAAR) 100 MG tablet Take 100 mg by mouth daily. Take 1 by mouth daily  ? Multiple Vitamin (MULTIVITAMIN WITH MINERALS) TABS tablet Take 1 tablet by mouth  daily.  ? nitroGLYCERIN (NITROSTAT) 0.4 MG SL tablet Place 1 tablet (0.4 mg total) under the tongue every 5 (five) minutes as needed for chest pain.  ? rosuvastatin (CRESTOR) 20 MG tablet Take 1 tablet (20 mg total) by mouth daily.  ? Tetrahydrozoline HCl (VISINE OP) Place 1 drop into both eyes daily as needed (dry eyes).  ? traMADol (ULTRAM-ER) 200 MG 24 hr tablet Take 1 tablet (200 mg total) by mouth every morning.  ? ?No facility-administered encounter medications on file as of 05/27/2021.  ? ?Reviewed chart for medication changes and drug therapy problems ahead of medication adherence call. ? ?Attempted to contact patient x 3 for medication review and health check, unable to reach patient, left voicemails to return call. ? ? ?  ?Care Gaps: ?Colonoscopy-11/01/11 ?Diabetic Foot Exam-NA ?Mammogram-12/29/18 ?Ophthalmology-NA ?Dexa Scan - NA ?Annual Well Visit - NA ?Micro albumin-NA ?Hemoglobin A1c- Na ? ?Star Rating Drugs: ?Rosuvastatin 20 mg-last fill 05/26/21 90 ds ?Losartan 100 mg-last fill 08/06/20 90 ds ? ? ?Ethelene Hal ?Clinical Pharmacist Assistant ?[phone]  ?

## 2021-05-29 DIAGNOSIS — G4733 Obstructive sleep apnea (adult) (pediatric): Secondary | ICD-10-CM | POA: Diagnosis not present

## 2021-06-07 ENCOUNTER — Other Ambulatory Visit: Payer: Self-pay | Admitting: Internal Medicine

## 2021-06-18 ENCOUNTER — Ambulatory Visit (INDEPENDENT_AMBULATORY_CARE_PROVIDER_SITE_OTHER): Payer: Medicare Other | Admitting: Internal Medicine

## 2021-06-18 ENCOUNTER — Other Ambulatory Visit: Payer: Self-pay

## 2021-06-18 ENCOUNTER — Encounter: Payer: Self-pay | Admitting: Internal Medicine

## 2021-06-18 VITALS — BP 150/102 | HR 95 | Temp 98.4°F | Ht 64.0 in | Wt 169.0 lb

## 2021-06-18 DIAGNOSIS — R739 Hyperglycemia, unspecified: Secondary | ICD-10-CM

## 2021-06-18 DIAGNOSIS — K0510 Chronic gingivitis, plaque induced: Secondary | ICD-10-CM

## 2021-06-18 DIAGNOSIS — I2510 Atherosclerotic heart disease of native coronary artery without angina pectoris: Secondary | ICD-10-CM

## 2021-06-18 DIAGNOSIS — N184 Chronic kidney disease, stage 4 (severe): Secondary | ICD-10-CM

## 2021-06-18 DIAGNOSIS — Z6830 Body mass index (BMI) 30.0-30.9, adult: Secondary | ICD-10-CM

## 2021-06-18 DIAGNOSIS — I10 Essential (primary) hypertension: Secondary | ICD-10-CM | POA: Diagnosis not present

## 2021-06-18 DIAGNOSIS — M797 Fibromyalgia: Secondary | ICD-10-CM

## 2021-06-18 DIAGNOSIS — E669 Obesity, unspecified: Secondary | ICD-10-CM | POA: Insufficient documentation

## 2021-06-18 DIAGNOSIS — E6609 Other obesity due to excess calories: Secondary | ICD-10-CM

## 2021-06-18 LAB — COMPREHENSIVE METABOLIC PANEL
ALT: 15 U/L (ref 0–35)
AST: 17 U/L (ref 0–37)
Albumin: 4.5 g/dL (ref 3.5–5.2)
Alkaline Phosphatase: 79 U/L (ref 39–117)
BUN: 16 mg/dL (ref 6–23)
CO2: 30 mEq/L (ref 19–32)
Calcium: 10.5 mg/dL (ref 8.4–10.5)
Chloride: 106 mEq/L (ref 96–112)
Creatinine, Ser: 1.28 mg/dL — ABNORMAL HIGH (ref 0.40–1.20)
GFR: 45.96 mL/min — ABNORMAL LOW (ref >60.00)
Glucose, Bld: 97 mg/dL (ref 70–99)
Potassium: 4.1 mEq/L (ref 3.5–5.1)
Sodium: 142 mEq/L (ref 135–145)
Total Bilirubin: 0.2 mg/dL (ref 0.2–1.2)
Total Protein: 7.6 g/dL (ref 6.0–8.3)

## 2021-06-18 LAB — HEMOGLOBIN A1C: Hgb A1c MFr Bld: 5.5 % (ref 4.6–6.5)

## 2021-06-18 MED ORDER — NIFEDIPINE ER OSMOTIC RELEASE 60 MG PO TB24: 60.0000 mg | ORAL_TABLET | Freq: Every day | ORAL | 3 refills | Status: DC

## 2021-06-18 NOTE — Assessment & Plan Note (Signed)
Flexeril at HS ?

## 2021-06-18 NOTE — Assessment & Plan Note (Signed)
We monitor GFR. Hydrate well ?

## 2021-06-18 NOTE — Assessment & Plan Note (Signed)
Diet discussed 

## 2021-06-18 NOTE — Progress Notes (Signed)
? ?Subjective:  ?Patient ID: Hayley Miller, female    DOB: [birth year]  Age: 61 y.o. MRN: [id number] ? ?CC: No chief complaint on file. ? ? ?HPI ?Hayley Miller presents for gingivitis due to Norvasc per Pam's dentist ? ?Outpatient Medications Prior to Visit  ?Medication Sig Dispense Refill  ? acetaminophen (TYLENOL) 325 MG tablet Take 650 mg by mouth every 8 (eight) hours as needed for headache, moderate pain, mild pain or fever.    ? aspirin EC 81 MG tablet Take 81 mg by mouth daily.    ? carvedilol (COREG) 12.5 MG tablet Take 1 tablet (12.5 mg total) by mouth 2 (two) times daily. 180 tablet 3  ? cetirizine (ZYRTEC) 10 MG tablet Take 1 tablet (10 mg total) by mouth daily. (Patient taking differently: Take 10 mg by mouth daily as needed.) 30 tablet 0  ? cyclobenzaprine (FLEXERIL) 5 MG tablet Take 1 tablet (5 mg total) by mouth at bedtime. 30 tablet 0  ? esomeprazole (NEXIUM) 40 MG capsule Take 1 capsule at 12:00 noon each [place]. 90 capsule 3  ? fluticasone (FLONASE) 50 MCG/ACT nasal spray USE 2 SPRAYS EACH NOSTRIL ONCE A [place] AS NEEDED FOR ALLERGIES OR CONGESTION. 16 g 0  ? hydrALAZINE (APRESOLINE) 25 MG tablet Take 1 tablet (25 mg total) by mouth 3 (three) times daily as needed. Take if BP>150/100 270 tablet 3  ? Lidocaine (LIDODERM EX) Apply 4 % topically daily. 12 hours on and 12 hours off    ? losartan (COZAAR) 100 MG tablet Take 100 mg by mouth daily. Take 1 by mouth daily    ? Multiple Vitamin (MULTIVITAMIN WITH MINERALS) TABS tablet Take 1 tablet by mouth daily.    ? nitroGLYCERIN (NITROSTAT) 0.4 MG SL tablet Place 1 tablet (0.4 mg total) under the tongue every 5 (five) minutes as needed for chest pain. 20 tablet 2  ? rosuvastatin (CRESTOR) 20 MG tablet Take 1 tablet (20 mg total) by mouth daily. 90 tablet 3  ? Tetrahydrozoline HCl (VISINE OP) Place 1 drop into both eyes daily as needed (dry eyes).    ? traMADol (ULTRAM-ER) 200 MG 24 hr tablet Take 1 tablet (200 mg total) by mouth every morning. 30 tablet  2  ? amLODipine (NORVASC) 10 MG tablet Take 1 tablet (10 mg total) by mouth daily. 90 tablet 3  ? ?No facility-administered medications prior to visit.  ? ? ?ROS: ?Review of Systems  ?Constitutional:  Positive for unexpected weight change. Negative for activity change, appetite change, chills and fatigue.  ?HENT:  Positive for dental problem. Negative for congestion, mouth sores and sinus pressure.   ?Eyes:  Negative for visual disturbance.  ?Respiratory:  Negative for cough and chest tightness.   ?Gastrointestinal:  Negative for abdominal pain and nausea.  ?Genitourinary:  Negative for difficulty urinating, frequency and vaginal pain.  ?Musculoskeletal:  Negative for back pain and gait problem.  ?Skin:  Negative for pallor and rash.  ?Neurological:  Negative for dizziness, tremors, weakness, numbness and headaches.  ?Psychiatric/Behavioral:  Negative for confusion and sleep disturbance.   ? ?Objective:  ?BP (!) 150/102 (BP Location: Left Arm, Patient Position: Sitting, Cuff Size: Large)   Pulse 95   Temp 98.4 ?F (36.9 ?C) (Oral)   Ht '5\' 4"'$  (1.626 m)   Wt 169 lb (76.7 kg)   LMP 06/07/2012   SpO2 90%   BMI 29.01 kg/m?  ? ?BP Readings from Last 3 Encounters:  ?06/18/21 (!) 150/102  ?02/11/21 122/88  ?12/08/20  128/90  ? ? ?Wt Readings from Last 3 Encounters:  ?06/18/21 169 lb (76.7 kg)  ?04/21/21 158 lb (71.7 kg)  ?02/11/21 166 lb (75.3 kg)  ? ? ?Physical Exam ?Constitutional:   ?   General: She is not in acute distress. ?   Appearance: She is well-developed. She is obese.  ?HENT:  ?   Head: Normocephalic.  ?   Right Ear: External ear normal.  ?   Left Ear: External ear normal.  ?   Nose: Nose normal.  ?Eyes:  ?   General:     ?   Right eye: No discharge.     ?   Left eye: No discharge.  ?   Conjunctiva/sclera: Conjunctivae normal.  ?   Pupils: Pupils are equal, round, and reactive to light.  ?Neck:  ?   Thyroid: No thyromegaly.  ?   Vascular: No JVD.  ?   Trachea: No tracheal deviation.  ?Cardiovascular:  ?    Rate and Rhythm: Normal rate and regular rhythm.  ?   Heart sounds: Normal heart sounds.  ?Pulmonary:  ?   Effort: No respiratory distress.  ?   Breath sounds: No stridor. No wheezing.  ?Abdominal:  ?   General: Bowel sounds are normal. There is no distension.  ?   Palpations: Abdomen is soft. There is no mass.  ?   Tenderness: There is no abdominal tenderness. There is no guarding or rebound.  ?Musculoskeletal:     ?   General: No tenderness.  ?   Cervical back: Normal range of motion and neck supple. No rigidity.  ?Lymphadenopathy:  ?   Cervical: No cervical adenopathy.  ?Skin: ?   Findings: No erythema or rash.  ?Neurological:  ?   Cranial Nerves: No cranial nerve deficit.  ?   Motor: No abnormal muscle tone.  ?   Coordination: Coordination normal.  ?   Deep Tendon Reflexes: Reflexes normal.  ?Psychiatric:     ?   Behavior: Behavior normal.     ?   Thought Content: Thought content normal.     ?   Judgment: Judgment normal.  ? ? ?gingival hypertrophy ?Lab Results  ?Component Value Date  ? WBC 10.8 (H) 10/29/2020  ? HGB 14.0 10/29/2020  ? HCT 42.4 10/29/2020  ? PLT 409.0 (H) 10/29/2020  ? GLUCOSE 96 02/11/2021  ? CHOL 198 02/11/2021  ? TRIG 194 (H) 02/11/2021  ? HDL 42 02/11/2021  ? LDLDIRECT 117.0 04/02/2019  ? LDLCALC 122 (H) 02/11/2021  ? ALT 24 02/11/2021  ? AST 23 02/11/2021  ? NA 141 02/11/2021  ? K 3.5 02/11/2021  ? CL 102 02/11/2021  ? CREATININE 1.35 (H) 02/11/2021  ? BUN 21 02/11/2021  ? CO2 23 02/11/2021  ? TSH 3.090 02/11/2021  ? INR 1.0 12/14/2018  ? HGBA1C 5.4 12/14/2018  ? ? ?No results found. ? ?Assessment & Plan:  ? ?Problem List Items Addressed This Visit   ? ? Severe uncontrolled hypertension (Chronic)  ?  Worsening gingivitis due to Norvasc per Pam's dentist - gingival hypertrophy ?D/c Norvasc ?Start Nifedipine ?NAS diet ?Wt loss adviced ?  ?  ? Relevant Medications  ? NIFEdipine (PROCARDIA XL/NIFEDICAL XL) 60 MG 24 hr tablet  ? Other Relevant Orders  ? Comprehensive metabolic panel  ?  Hemoglobin A1c  ? CAD (coronary artery disease) (Chronic)  ?  NAS diet ?Wt loss advised ?  Worsening gingivitis due to Norvasc per Pam's dentist - gingival hypertrophy ?D/c Norvasc ?  Start Nifedipine ?  ?  ?  ?  ? Relevant Medications  ? NIFEdipine (PROCARDIA XL/NIFEDICAL XL) 60 MG 24 hr tablet  ? Fibromyalgia  ?  Flexeril at HS ?  ?  ? CKD (chronic kidney disease) stage 4, GFR 15-29 ml/min (HCC)  ?  We monitor GFR. Hydrate well ?  ?  ? Gingivitis  ?  Worsening gingivitis due to Norvasc per Pam's dentist - gingival hypertrophy ?D/c Norvasc ?Start Nifedipine ?  ?  ? Obesity  ?  Diet discussed ?  ?  ? ?Other Visit Diagnoses   ? ? Hyperglycemia    -  Primary  ? Relevant Orders  ? Hemoglobin A1c  ? ?  ?  ? ? ?Meds ordered this encounter  ?Medications  ? NIFEdipine (PROCARDIA XL/NIFEDICAL XL) 60 MG 24 hr tablet  ?  Sig: Take 1 tablet (60 mg total) by mouth daily.  ?  Dispense:  90 tablet  ?  Refill:  3  ?  ? ? ?Follow-up: No follow-ups on file. ? ?Walker Kehr, MD ?

## 2021-06-18 NOTE — Assessment & Plan Note (Signed)
Worsening gingivitis due to Norvasc per Pam's dentist - gingival hypertrophy ?D/c Norvasc ?Start Nifedipine ?NAS diet ?Wt loss adviced ?

## 2021-06-18 NOTE — Assessment & Plan Note (Addendum)
Worsening gingivitis due to Norvasc per Pam's dentist - gingival hypertrophy ?D/c Norvasc ?Start Nifedipine ?

## 2021-06-18 NOTE — Patient Instructions (Signed)
Stop Amlodipine ?Start Nifedipine ?

## 2021-06-18 NOTE — Assessment & Plan Note (Signed)
NAS diet ?Wt loss advised ?  Worsening gingivitis due to Norvasc per Pam's dentist - gingival hypertrophy ?D/c Norvasc ?Start Nifedipine ?  ?  ? ?

## 2021-06-26 ENCOUNTER — Other Ambulatory Visit: Payer: Self-pay | Admitting: Internal Medicine

## 2021-06-26 NOTE — Telephone Encounter (Signed)
LOV:  06/18/21 ?Next OV: 09/21/21 ?

## 2021-06-29 DIAGNOSIS — G4733 Obstructive sleep apnea (adult) (pediatric): Secondary | ICD-10-CM | POA: Diagnosis not present

## 2021-07-03 ENCOUNTER — Other Ambulatory Visit: Payer: Self-pay | Admitting: Internal Medicine

## 2021-07-29 DIAGNOSIS — G4733 Obstructive sleep apnea (adult) (pediatric): Secondary | ICD-10-CM | POA: Diagnosis not present

## 2021-08-05 ENCOUNTER — Telehealth: Payer: Self-pay | Admitting: *Deleted

## 2021-08-05 ENCOUNTER — Ambulatory Visit: Payer: Medicare Other | Admitting: Cardiology

## 2021-08-05 ENCOUNTER — Encounter: Payer: Self-pay | Admitting: Cardiology

## 2021-08-05 VITALS — BP 128/92 | HR 92 | Ht 64.0 in | Wt 164.0 lb

## 2021-08-05 DIAGNOSIS — I10 Essential (primary) hypertension: Secondary | ICD-10-CM | POA: Diagnosis not present

## 2021-08-05 DIAGNOSIS — G4733 Obstructive sleep apnea (adult) (pediatric): Secondary | ICD-10-CM

## 2021-08-05 NOTE — Patient Instructions (Signed)
Medication Instructions:  ?Your physician recommends that you continue on your current medications as directed. Please refer to the Current Medication list given to you today. ? ?*If you need a refill on your cardiac medications before your next appointment, please call your pharmacy* ? ?Follow-Up: ?At [facility], you and your health needs are our priority.  As part of our continuing mission to provide you with exceptional heart care, we have created designated Provider Care Teams.  These Care Teams include your primary Cardiologist (physician) and Advanced Practice Providers (APPs -  Physician Assistants and Nurse Practitioners) who all work together to provide you with the care you need, when you need it. ? ?Your next appointment:   ?1 year(s) ? ?The format for your next appointment:   ?In Person ? ?Provider: ?Fransico Him, MD  ? ?Other Instructions ?Dr. Radford Pax has ordered you new CPAP supplies and a chin strap ? ?Important Information About Sugar ? ? ? ? ?  ?

## 2021-08-05 NOTE — Progress Notes (Signed)
?Cardiology Office Note:   ? ?Date:  08/05/2021  ? ?ID:  Hayley Miller, DOB [birth year], MRN [id number] ? ?PCP:  Cassandria Anger, MD  ?Cardiologist:  Dorris Carnes, MD   ? ?Referring MD: Cassandria Anger, MD  ? ?Chief Complaint  ?Patient presents with  ? Sleep Apnea  ? ? ?History of Present Illness:   ? ?Hayley Miller is a 61 y.o. female with a hx of ASCHD, fibromyalgia, GERD, hypertension, hyperlipidemia who was referred for sleep study due to daytime sleepiness and snoring has been recorded by her family members.  She has nonrestorative sleep.  She underwent home sleep study on 02/15/2021 showing severe obstructive sleep apnea with an AHI of 29.3/h and no significant central events.  She did have nocturnal hypoxemia.  She was referred for CPAP titration on 04/21/2021 and was titrated to 13 cm H2O. ? ?She is doing well with her CPAP device and thinks that she has gotten used to it.  She tolerates the nasal pillow mask and feels the pressure is adequate.  She says that with the nasal pillow mask her mouth falls open. Since going on CPAP she is still having problems with feeling very tired during the [place]. She goes to bed at 9-10pm and falls asleep within 15 min.  She will get up at 2-3am to use the bathroom and goes right back to sleep.  She then gets up around 6-7am.    She denies any significant nasal dryness or nasal congestion.  She has a lot of mouth dryness. She does not think that he snores.    ? ?Past Medical History:  ?Diagnosis Date  ? Allergy   ? Anemia   ? Anxiety   ? CAD (coronary artery disease)   ? a. s/p AL MI in 2006 tx with PTCA to LAD;  b. LHC (10/2009): lum irregs in LAD, o/w no CAD, EF 55-60%;  c. Myoview (07/2011):  no ischemia, EF 63%;  d. Echo (11/2011):  mild LVH, EF 60-65%, Gr 1 DD;  e.  Lexiscan Myoview (04/2013):  Inferior bowel atten artifact with inferoapical defect; no ischemia; EF 58% (low risk)  ? Cameron lesion, acute   ? Chronic lower back pain   ? Chronic pain syndrome   ?  Daily headache   ? Depression   ? Erosive esophagitis   ? Fibromyalgia   ? GERD (gastroesophageal reflux disease)   ? Hemorrhage   ? upper gastrointestional. GI bleed 10/09 due to NSAID  ? Hiatal hernia   ? HLD (hyperlipidemia)   ? HTN (hypertension)   ? Myocardial infarction ([place]) 2006 X 2  ? Rhinitis   ? Seasonal allergies   ? ? ?Past Surgical History:  ?Procedure Laterality Date  ? BLADDER SUSPENSION N/A 05/08/2013  ? Procedure: TRANSVAGINAL TAPE (TVT) PROCEDURE;  Surgeon: Olga Millers, MD;  Location: [facility];  Service: Gynecology;  Laterality: N/A;  ? BREAST CYST EXCISION Left 2002  ? CARDIAC CATHETERIZATION  07/19/2017  ? COLONOSCOPY    ? CORONARY ANGIOPLASTY  2006  ? CYSTOSCOPY N/A 05/08/2013  ? Procedure: CYSTOSCOPY;  Surgeon: Olga Millers, MD;  Location: [facility];  Service: Gynecology;  Laterality: N/A;  ? LAPAROSCOPIC ASSISTED VAGINAL HYSTERECTOMY N/A 05/08/2013  ? Procedure: LAPAROSCOPIC ASSISTED VAGINAL HYSTERECTOMY;  Surgeon: Olga Millers, MD;  Location: [facility];  Service: Gynecology;  Laterality: N/A;  ? LAPAROSCOPIC BILATERAL SALPINGO OOPHERECTOMY N/A 05/08/2013  ? Procedure: LAPAROSCOPIC BILATERAL SALPINGO OOPHORECTOMY;  Surgeon: Olga Millers, MD;  Location: [facility];  Service: Gynecology;  Laterality: N/A;  ? LEFT HEART CATH AND CORONARY ANGIOGRAPHY N/A 07/19/2017  ? Procedure: LEFT HEART CATH AND CORONARY ANGIOGRAPHY;  Surgeon: Martinique, Peter M, MD;  Location: [facility];  Service: Cardiovascular;  Laterality: N/A;  ? UPPER GASTROINTESTINAL ENDOSCOPY    ? ? ?Current Medications: ?Current Meds  ?Medication Sig  ? acetaminophen (TYLENOL) 325 MG tablet Take 650 mg by mouth every 8 (eight) hours as needed for headache, moderate pain, mild pain or fever.  ? aspirin EC 81 MG tablet Take 81 mg by mouth daily.  ? carvedilol (COREG) 12.5 MG tablet Take 1 tablet (12.5 mg total) by mouth 2 (two) times daily.  ? cetirizine (ZYRTEC) 10 MG tablet Take 1 tablet (10 mg total) by mouth daily. (Patient taking  differently: Take 10 mg by mouth daily as needed.)  ? cyclobenzaprine (FLEXERIL) 5 MG tablet TAKE ONE TABLET BY MOUTH AT BEDTIME  ? esomeprazole (NEXIUM) 40 MG capsule Take 1 capsule at 12:00 noon each [place].  ? fluticasone (FLONASE) 50 MCG/ACT nasal spray USE 2 SPRAYS EACH NOSTRIL ONCE A [place] AS NEEDED FOR ALLERGIES OR CONGESTION.  ? hydrALAZINE (APRESOLINE) 25 MG tablet Take 1 tablet (25 mg total) by mouth 3 (three) times daily as needed. Take if BP>150/100  ? Lidocaine (LIDODERM EX) Apply 4 % topically daily. 12 hours on and 12 hours off  ? losartan (COZAAR) 100 MG tablet Take 100 mg by mouth daily. Take 1 by mouth daily  ? Multiple Vitamin (MULTIVITAMIN WITH MINERALS) TABS tablet Take 1 tablet by mouth daily.  ? NIFEdipine (PROCARDIA XL/NIFEDICAL XL) 60 MG 24 hr tablet Take 1 tablet (60 mg total) by mouth daily.  ? nitroGLYCERIN (NITROSTAT) 0.4 MG SL tablet Place 1 tablet (0.4 mg total) under the tongue every 5 (five) minutes as needed for chest pain.  ? rosuvastatin (CRESTOR) 20 MG tablet Take 1 tablet (20 mg total) by mouth daily.  ? Tetrahydrozoline HCl (VISINE OP) Place 1 drop into both eyes daily as needed (dry eyes).  ? traMADol (ULTRAM-ER) 200 MG 24 hr tablet TAKE ONE TABLET BY MOUTH IN THE MORNING  ?  ? ?Allergies:   Amlodipine, Clonidine hydrochloride, Coreg [carvedilol], Gabapentin, Ibuprofen, Lovastatin, and Spironolactone  ? ?Social History  ? ?Socioeconomic History  ? Marital status: Divorced  ?  Spouse name: Not on file  ? Number of children: 1  ? Years of education: Not on file  ? Highest education level: Not on file  ?Occupational History  ? Occupation: unemployed  ?  Employer: UNEMPLOYED  ?Tobacco Use  ? Smoking status: Former  ?  Packs/[place]: 1.00  ?  Years: 3.00  ?  Pack years: 3.00  ?  Types: Cigarettes  ?  Quit date: 04/17/2017  ?  Years since quitting: 4.3  ? Smokeless tobacco: Never  ? Tobacco comments:  ?  Divorced, lives with female domestic partner  ?Vaping Use  ? Vaping Use: Never used   ?Substance and Sexual Activity  ? Alcohol use: Not Currently  ?  Comment: 07/20/2017 "might drink once or twice/year"  ? Drug use: No  ? Sexual activity: Yes  ?  Birth control/protection: Other-see comments  ?  Comment: has a female domestic partner  ?Other Topics Concern  ? Not on file  ?Social History Narrative  ? Lives alone - divorced; 1 child.   ? Used to live with female domestic partner  ? Unemployed - disability.   ?   ?   ? ?  Social Determinants of Health  ? ?Financial Resource Strain: Low Risk   ? Difficulty of Paying Living Expenses: Not hard at all  ?Food Insecurity: Not on file  ?Transportation Needs: Not on file  ?Physical Activity: Not on file  ?Stress: Not on file  ?Social Connections: Not on file  ?  ? ?Family History: ?The patient's family history includes Colon cancer in her maternal aunt; Colon polyps in her mother; Diabetes in her maternal aunt; Heart disease in her maternal grandmother and son; Heart murmur in her mother; Hypertension in her mother. There is no history of Esophageal cancer, Rectal cancer, Stomach cancer, or Breast cancer. ? ?ROS:   ?Please see the history of present illness.    ?ROS  ?All other systems reviewed and negative.  ? ?EKGs/Labs/Other Studies Reviewed:   ? ?The following studies were reviewed today: ?Home sleep study and CPAP titration as well as PAP compliance download ? ?EKG:  EKG is not ordered today.  ? ?Recent Labs: ?10/29/2020: Hemoglobin 14.0; Platelets 409.0 ?02/11/2021: TSH 3.090 ?06/18/2021: ALT 15; BUN 16; Creatinine, Ser 1.28; Potassium 4.1; Sodium 142  ? ?Recent Lipid Panel ?   ?Component Value Date/Time  ? CHOL 198 02/11/2021 1408  ? TRIG 194 (H) 02/11/2021 1408  ? TRIG 444 11/14/2009 0000  ? HDL 42 02/11/2021 1408  ? CHOLHDL 4.7 (H) 02/11/2021 1408  ? CHOLHDL 5 04/02/2019 1646  ? VLDL 50.0 (H) 04/02/2019 1646  ? LDLCALC 122 (H) 02/11/2021 1408  ? LDLDIRECT 117.0 04/02/2019 1646  ? ? ?CHA2DS2-VASc Score =   '[ ]'$ .  Therefore, the patient's annual risk of  stroke is   %.    ?  ? ? ?Physical Exam:   ? ?VS:  BP (!) 128/92   Pulse 92   Ht '5\' 4"'$  (1.626 m)   Wt 164 lb (74.4 kg)   LMP 06/07/2012   SpO2 99%   BMI 28.15 kg/m?    ? ?Wt Readings from Last 3 Encounters:  ?05

## 2021-08-05 NOTE — Telephone Encounter (Signed)
-----   Message from Antonieta Iba, RN sent at 08/05/2021  2:56 PM EDT ----- ?Per Dr. Radford Pax:  ?Please order new CPAP supplies and a chin strap.  ?Thanks! ? ?

## 2021-08-05 NOTE — Telephone Encounter (Signed)
Order placed to [facility] via community message 

## 2021-08-27 ENCOUNTER — Other Ambulatory Visit: Payer: Self-pay | Admitting: Internal Medicine

## 2021-08-29 DIAGNOSIS — G4733 Obstructive sleep apnea (adult) (pediatric): Secondary | ICD-10-CM | POA: Diagnosis not present

## 2021-09-18 ENCOUNTER — Other Ambulatory Visit: Payer: Self-pay | Admitting: Internal Medicine

## 2021-09-21 ENCOUNTER — Encounter: Payer: Self-pay | Admitting: Internal Medicine

## 2021-09-21 ENCOUNTER — Ambulatory Visit (INDEPENDENT_AMBULATORY_CARE_PROVIDER_SITE_OTHER): Payer: Medicare Other | Admitting: Internal Medicine

## 2021-09-21 VITALS — BP 120/70 | HR 81 | Temp 97.7°F | Ht 64.0 in | Wt 165.0 lb

## 2021-09-21 DIAGNOSIS — G4733 Obstructive sleep apnea (adult) (pediatric): Secondary | ICD-10-CM

## 2021-09-21 DIAGNOSIS — I2510 Atherosclerotic heart disease of native coronary artery without angina pectoris: Secondary | ICD-10-CM | POA: Diagnosis not present

## 2021-09-21 DIAGNOSIS — M545 Low back pain, unspecified: Secondary | ICD-10-CM

## 2021-09-21 DIAGNOSIS — N184 Chronic kidney disease, stage 4 (severe): Secondary | ICD-10-CM

## 2021-09-21 DIAGNOSIS — H9313 Tinnitus, bilateral: Secondary | ICD-10-CM | POA: Diagnosis not present

## 2021-09-21 DIAGNOSIS — G8929 Other chronic pain: Secondary | ICD-10-CM

## 2021-09-21 DIAGNOSIS — E785 Hyperlipidemia, unspecified: Secondary | ICD-10-CM

## 2021-09-21 DIAGNOSIS — I10 Essential (primary) hypertension: Secondary | ICD-10-CM | POA: Diagnosis not present

## 2021-09-21 MED ORDER — TRAMADOL HCL ER 200 MG PO TB24: 200.0000 mg | ORAL_TABLET | Freq: Every morning | ORAL | 2 refills | Status: DC

## 2021-09-21 MED ORDER — TRAMADOL HCL ER 100 MG PO TB24: 100.0000 mg | ORAL_TABLET | Freq: Every day | ORAL | 2 refills | Status: DC

## 2021-09-21 NOTE — Assessment & Plan Note (Addendum)
Worse. Hold Crestor x 2 wks. Continue w/Tramadol ER 200 mg/d - hs, start Tramadol ER 100 mg qhs (max 300 mg/24 h)  Potential benefits of a long term opioids use as well as potential risks (i.e. addiction risk, apnea etc) and complications (i.e. Somnolence, constipation and others) were explained to the patient and were aknowledged.

## 2021-09-21 NOTE — Assessment & Plan Note (Signed)
Hold Crestor x 2 wks due to pains

## 2021-09-21 NOTE — Progress Notes (Signed)
Subjective:  Patient ID: Hayley Miller, female    DOB: 1961/02/22  Age: 61 y.o. MRN: [id number]  CC: No chief complaint on file.   HPI Hayley Miller presents for HTN, LBP, CAD. C/o severe LBP 10/10 Pt ran out of pain meds   Outpatient Medications Prior to Visit  Medication Sig Dispense Refill   acetaminophen (TYLENOL) 325 MG tablet Take 650 mg by mouth every 8 (eight) hours as needed for headache, moderate pain, mild pain or fever.     aspirin EC 81 MG tablet Take 81 mg by mouth daily.     carvedilol (COREG) 12.5 MG tablet Take 1 tablet (12.5 mg total) by mouth 2 (two) times daily. 180 tablet 3   cetirizine (ZYRTEC) 10 MG tablet Take 1 tablet (10 mg total) by mouth daily. (Patient taking differently: Take 10 mg by mouth daily as needed.) 30 tablet 0   cyclobenzaprine (FLEXERIL) 5 MG tablet TAKE ONE TABLET BY MOUTH AT BEDTIME 30 tablet 2   esomeprazole (NEXIUM) 40 MG capsule Take 1 capsule at 12:00 noon each [place]. 90 capsule 3   fluticasone (FLONASE) 50 MCG/ACT nasal spray USE 2 SPRAYS EACH NOSTRIL ONCE A [place] AS NEEDED FOR ALLERGIES OR CONGESTION. 16 g 0   hydrALAZINE (APRESOLINE) 25 MG tablet Take 1 tablet (25 mg total) by mouth 3 (three) times daily as needed. Take if BP>150/100 270 tablet 3   Lidocaine (LIDODERM EX) Apply 4 % topically daily. 12 hours on and 12 hours off     losartan (COZAAR) 100 MG tablet Take 100 mg by mouth daily. Take 1 by mouth daily     Multiple Vitamin (MULTIVITAMIN WITH MINERALS) TABS tablet Take 1 tablet by mouth daily.     NIFEdipine (PROCARDIA XL/NIFEDICAL XL) 60 MG 24 hr tablet Take 1 tablet (60 mg total) by mouth daily. 90 tablet 3   nitroGLYCERIN (NITROSTAT) 0.4 MG SL tablet Place 1 tablet (0.4 mg total) under the tongue every 5 (five) minutes as needed for chest pain. 20 tablet 2   rosuvastatin (CRESTOR) 20 MG tablet Take 1 tablet (20 mg total) by mouth daily. 90 tablet 3   Tetrahydrozoline HCl (VISINE OP) Place 1 drop into both eyes daily as  needed (dry eyes).     traMADol (ULTRAM-ER) 200 MG 24 hr tablet TAKE ONE TABLET BY MOUTH IN THE MORNING 30 tablet 2   No facility-administered medications prior to visit.    ROS: Review of Systems  Constitutional:  Positive for fatigue. Negative for activity change, appetite change, chills and unexpected weight change.  HENT:  Positive for hearing loss and tinnitus. Negative for congestion, ear pain, mouth sores and sinus pressure.   Eyes:  Negative for visual disturbance.  Respiratory:  Negative for cough and chest tightness.   Gastrointestinal:  Negative for abdominal pain and nausea.  Genitourinary:  Negative for difficulty urinating, frequency and vaginal pain.  Musculoskeletal:  Positive for back pain and myalgias. Negative for gait problem.  Skin:  Negative for pallor and rash.  Neurological:  Negative for dizziness, tremors, weakness, numbness and headaches.  Psychiatric/Behavioral:  Negative for confusion and sleep disturbance.     Objective:  BP 120/70 (BP Location: Right Arm, Patient Position: Sitting, Cuff Size: Large)   Pulse 81   Temp 97.7 F (36.5 C) (Oral)   Ht 5\' 4"  (1.626 m)   Wt 165 lb (74.8 kg)   LMP 06/07/2012   SpO2 96%   BMI 28.32 kg/m   BP Readings  from Last 3 Encounters:  09/21/21 120/70  08/05/21 (!) 128/92  06/18/21 (!) 150/102    Wt Readings from Last 3 Encounters:  09/21/21 165 lb (74.8 kg)  08/05/21 164 lb (74.4 kg)  06/18/21 169 lb (76.7 kg)    Physical Exam Constitutional:      General: She is not in acute distress.    Appearance: She is well-developed. She is obese.  HENT:     Head: Normocephalic.     Right Ear: External ear normal.     Left Ear: External ear normal.     Nose: Nose normal.  Eyes:     General:        Right eye: No discharge.        Left eye: No discharge.     Conjunctiva/sclera: Conjunctivae normal.     Pupils: Pupils are equal, round, and reactive to light.  Neck:     Thyroid: No thyromegaly.     Vascular:  No JVD.     Trachea: No tracheal deviation.  Cardiovascular:     Rate and Rhythm: Normal rate and regular rhythm.     Heart sounds: Normal heart sounds.  Pulmonary:     Effort: No respiratory distress.     Breath sounds: No stridor. No wheezing.  Abdominal:     General: Bowel sounds are normal. There is no distension.     Palpations: Abdomen is soft. There is no mass.     Tenderness: There is no abdominal tenderness. There is no guarding or rebound.  Musculoskeletal:        General: Tenderness present.     Cervical back: Normal range of motion and neck supple. No rigidity.  Lymphadenopathy:     Cervical: No cervical adenopathy.  Skin:    Findings: No erythema or rash.  Neurological:     Mental Status: She is oriented to person, place, and time.     Cranial Nerves: No cranial nerve deficit.     Motor: No abnormal muscle tone.     Coordination: Coordination normal.     Deep Tendon Reflexes: Reflexes normal.  Psychiatric:        Behavior: Behavior normal.        Thought Content: Thought content normal.        Judgment: Judgment normal.   LS w/pain  Lab Results  Component Value Date   WBC 10.8 (H) 10/29/2020   HGB 14.0 10/29/2020   HCT 42.4 10/29/2020   PLT 409.0 (H) 10/29/2020   GLUCOSE 97 06/18/2021   CHOL 198 02/11/2021   TRIG 194 (H) 02/11/2021   HDL 42 02/11/2021   LDLDIRECT 117.0 04/02/2019   LDLCALC 122 (H) 02/11/2021   ALT 15 06/18/2021   AST 17 06/18/2021   NA 142 06/18/2021   K 4.1 06/18/2021   CL 106 06/18/2021   CREATININE 1.28 (H) 06/18/2021   BUN 16 06/18/2021   CO2 30 06/18/2021   TSH 3.090 02/11/2021   INR 1.0 12/14/2018   HGBA1C 5.5 06/18/2021    No results found.  Assessment & Plan:   Problem List Items Addressed This Visit     CAD (coronary artery disease) (Chronic)    Chronic Cont o ASA, NTG prn, Pravachol BP meds      Severe uncontrolled hypertension (Chronic)    Better Cont on Coreg, Hyzaar, spironolactone, nifedipine       CKD (chronic kidney disease) stage 4, GFR 15-29 ml/min (HCC)    Monitor GFR  Dyslipidemia    Hold Crestor x 2 wks due to pains      LOW BACK PAIN - Primary    Worse. Hold Crestor x 2 wks. Continue w/Tramadol ER 200 mg/d - hs, start Tramadol ER 100 mg qhs (max 300 mg/24 h)  Potential benefits of a long term opioids use as well as potential risks (i.e. addiction risk, apnea etc) and complications (i.e. Somnolence, constipation and others) were explained to the patient and were aknowledged.      Relevant Medications   traMADol (ULTRAM-ER) 200 MG 24 hr tablet   traMADol (ULTRAM-ER) 100 MG 24 hr tablet   OSA (obstructive sleep apnea)    On CPAP now -       Tinnitus    Worse L>R ENT ref      Relevant Orders   Ambulatory referral to ENT      Meds ordered this encounter  Medications   traMADol (ULTRAM-ER) 200 MG 24 hr tablet    Sig: Take 1 tablet (200 mg total) by mouth every morning.    Dispense:  30 tablet    Refill:  2   traMADol (ULTRAM-ER) 100 MG 24 hr tablet    Sig: Take 1 tablet (100 mg total) by mouth at bedtime.    Dispense:  30 tablet    Refill:  2      Follow-up: Return in about 3 months (around 12/22/2021) for a follow-up visit.  Sonda Primes, MD

## 2021-09-21 NOTE — Assessment & Plan Note (Signed)
Better Cont on Coreg, Hyzaar, spironolactone, nifedipine

## 2021-10-13 DIAGNOSIS — G4733 Obstructive sleep apnea (adult) (pediatric): Secondary | ICD-10-CM | POA: Diagnosis not present

## 2021-11-05 ENCOUNTER — Other Ambulatory Visit: Payer: Self-pay | Admitting: Internal Medicine

## 2021-11-13 DIAGNOSIS — G4733 Obstructive sleep apnea (adult) (pediatric): Secondary | ICD-10-CM | POA: Diagnosis not present

## 2021-12-22 ENCOUNTER — Ambulatory Visit (INDEPENDENT_AMBULATORY_CARE_PROVIDER_SITE_OTHER): Payer: Medicare Other | Admitting: Internal Medicine

## 2021-12-22 ENCOUNTER — Encounter: Payer: Self-pay | Admitting: Internal Medicine

## 2021-12-22 DIAGNOSIS — M545 Low back pain, unspecified: Secondary | ICD-10-CM | POA: Diagnosis not present

## 2021-12-22 DIAGNOSIS — J0111 Acute recurrent frontal sinusitis: Secondary | ICD-10-CM

## 2021-12-22 DIAGNOSIS — H6120 Impacted cerumen, unspecified ear: Secondary | ICD-10-CM | POA: Insufficient documentation

## 2021-12-22 DIAGNOSIS — G8929 Other chronic pain: Secondary | ICD-10-CM

## 2021-12-22 DIAGNOSIS — H6123 Impacted cerumen, bilateral: Secondary | ICD-10-CM

## 2021-12-22 DIAGNOSIS — I10 Essential (primary) hypertension: Secondary | ICD-10-CM | POA: Diagnosis not present

## 2021-12-22 DIAGNOSIS — I2510 Atherosclerotic heart disease of native coronary artery without angina pectoris: Secondary | ICD-10-CM

## 2021-12-22 MED ORDER — CEFUROXIME AXETIL 500 MG PO TABS: 500.0000 mg | ORAL_TABLET | Freq: Two times a day (BID) | ORAL | 0 refills | Status: AC

## 2021-12-22 MED ORDER — METHYLPREDNISOLONE ACETATE 80 MG/ML IJ SUSP
80.0000 mg | Freq: Once | INTRAMUSCULAR | Status: AC
  Administered 2021-12-22: 80 mg via INTRAMUSCULAR

## 2021-12-22 MED ORDER — FLUCONAZOLE 150 MG PO TABS: 150.0000 mg | ORAL_TABLET | Freq: Once | ORAL | 1 refills | Status: DC

## 2021-12-22 NOTE — Assessment & Plan Note (Addendum)
Start Ceftin po Depo-medrol 80 mg IM

## 2021-12-22 NOTE — Assessment & Plan Note (Signed)
Cont on Tramadol ER 100 mg qhs (max 300 mg/24 h)  Potential benefits of a long term opioids use as well as potential risks (i.e. addiction risk, apnea etc) and complications (i.e. Somnolence, constipation and others) were explained to the patient and were aknowledged.

## 2021-12-22 NOTE — Progress Notes (Addendum)
Pls cosign for Depo inj../lmb  Medical screening examination/treatment/procedure(s) were performed by non-physician practitioner and as supervising physician I was immediately available for consultation/collaboration.  I agree with above. Aleksei Plotnikov, MD  

## 2021-12-22 NOTE — Assessment & Plan Note (Signed)
B ears RTC to irrigate

## 2021-12-22 NOTE — Assessment & Plan Note (Signed)
No CP 

## 2021-12-22 NOTE — Assessment & Plan Note (Signed)
Pt took a decongestant - elevated BP

## 2021-12-22 NOTE — Progress Notes (Signed)
Subjective:  Patient ID: Hayley Miller, female    DOB: 1961/01/17  Age: 61 y.o. MRN: [id number]  CC: Follow-up (3 month f/u)   HPI Hayley Miller presents for chronic pain, CAD, HTN C/o sinusitis sx's x 2 wks  Outpatient Medications Prior to Visit  Medication Sig Dispense Refill   acetaminophen (TYLENOL) 325 MG tablet Take 650 mg by mouth every 8 (eight) hours as needed for headache, moderate pain, mild pain or fever.     aspirin EC 81 MG tablet Take 81 mg by mouth daily.     carvedilol (COREG) 12.5 MG tablet Take 1 tablet (12.5 mg total) by mouth 2 (two) times daily. 180 tablet 3   cetirizine (ZYRTEC) 10 MG tablet Take 1 tablet (10 mg total) by mouth daily. (Patient taking differently: Take 10 mg by mouth daily as needed.) 30 tablet 0   cyclobenzaprine (FLEXERIL) 5 MG tablet TAKE ONE TABLET BY MOUTH AT BEDTIME 30 tablet 2   esomeprazole (NEXIUM) 40 MG capsule Take 1 capsule at 12:00 noon each [place]. 90 capsule 3   fluticasone (FLONASE) 50 MCG/ACT nasal spray USE 2 SPRAYS EACH NOSTRIL ONCE A [place] AS NEEDED FOR ALLERGIES OR CONGESTION. 16 g 2   hydrALAZINE (APRESOLINE) 25 MG tablet Take 1 tablet (25 mg total) by mouth 3 (three) times daily as needed. Take if BP>150/100 270 tablet 3   Lidocaine (LIDODERM EX) Apply 4 % topically daily. 12 hours on and 12 hours off     losartan (COZAAR) 100 MG tablet Take 100 mg by mouth daily. Take 1 by mouth daily     Multiple Vitamin (MULTIVITAMIN WITH MINERALS) TABS tablet Take 1 tablet by mouth daily.     NIFEdipine (PROCARDIA XL/NIFEDICAL XL) 60 MG 24 hr tablet Take 1 tablet (60 mg total) by mouth daily. 90 tablet 3   nitroGLYCERIN (NITROSTAT) 0.4 MG SL tablet Place 1 tablet (0.4 mg total) under the tongue every 5 (five) minutes as needed for chest pain. 20 tablet 2   rosuvastatin (CRESTOR) 20 MG tablet Take 1 tablet (20 mg total) by mouth daily. 90 tablet 3   Tetrahydrozoline HCl (VISINE OP) Place 1 drop into both eyes daily as needed (dry eyes).      traMADol (ULTRAM-ER) 100 MG 24 hr tablet Take 1 tablet (100 mg total) by mouth at bedtime. 30 tablet 2   traMADol (ULTRAM-ER) 200 MG 24 hr tablet Take 1 tablet (200 mg total) by mouth every morning. 30 tablet 2   No facility-administered medications prior to visit.    ROS: Review of Systems  Constitutional:  Negative for activity change, appetite change, chills, fatigue and unexpected weight change.  HENT:  Positive for congestion, postnasal drip, rhinorrhea, sinus pressure and sinus pain. Negative for mouth sores.   Eyes:  Negative for visual disturbance.  Respiratory:  Negative for cough and chest tightness.   Gastrointestinal:  Negative for abdominal pain and nausea.  Genitourinary:  Negative for difficulty urinating, frequency and vaginal pain.  Musculoskeletal:  Positive for back pain. Negative for gait problem.  Skin:  Negative for pallor and rash.  Neurological:  Negative for dizziness, tremors, weakness, numbness and headaches.  Psychiatric/Behavioral:  Negative for confusion and sleep disturbance.     Objective:  BP (!) 158/110 (BP Location: Left Arm)   Pulse 98   Temp 97.8 F (36.6 C) (Oral)   Ht '5\' 4"'$  (1.626 m)   Wt 157 lb 12.8 oz (71.6 kg)   LMP 06/07/2012  SpO2 90%   BMI 27.09 kg/m   BP Readings from Last 3 Encounters:  12/22/21 (!) 158/110  09/21/21 120/70  08/05/21 (!) 128/92    Wt Readings from Last 3 Encounters:  12/22/21 157 lb 12.8 oz (71.6 kg)  09/21/21 165 lb (74.8 kg)  08/05/21 164 lb (74.4 kg)    Physical Exam Constitutional:      General: She is not in acute distress.    Appearance: She is well-developed. She is obese.  HENT:     Head: Normocephalic.     Right Ear: External ear normal.     Left Ear: External ear normal.     Nose: Nose normal.  Eyes:     General:        Right eye: No discharge.        Left eye: No discharge.     Conjunctiva/sclera: Conjunctivae normal.     Pupils: Pupils are equal, round, and reactive to light.   Neck:     Thyroid: No thyromegaly.     Vascular: No JVD.     Trachea: No tracheal deviation.  Cardiovascular:     Rate and Rhythm: Normal rate and regular rhythm.     Heart sounds: Normal heart sounds.  Pulmonary:     Effort: No respiratory distress.     Breath sounds: No stridor. No wheezing.  Abdominal:     General: Bowel sounds are normal. There is no distension.     Palpations: Abdomen is soft. There is no mass.     Tenderness: There is no abdominal tenderness. There is no guarding or rebound.  Musculoskeletal:        General: No tenderness.     Cervical back: Normal range of motion and neck supple. No rigidity.  Lymphadenopathy:     Cervical: No cervical adenopathy.  Skin:    Findings: No erythema or rash.  Neurological:     Cranial Nerves: No cranial nerve deficit.     Motor: No abnormal muscle tone.     Coordination: Coordination normal.     Deep Tendon Reflexes: Reflexes normal.  Psychiatric:        Behavior: Behavior normal.        Thought Content: Thought content normal.        Judgment: Judgment normal.   B wax Swollen mucosa - nose  Lab Results  Component Value Date   WBC 10.8 (H) 10/29/2020   HGB 14.0 10/29/2020   HCT 42.4 10/29/2020   PLT 409.0 (H) 10/29/2020   GLUCOSE 97 06/18/2021   CHOL 198 02/11/2021   TRIG 194 (H) 02/11/2021   HDL 42 02/11/2021   LDLDIRECT 117.0 04/02/2019   LDLCALC 122 (H) 02/11/2021   ALT 15 06/18/2021   AST 17 06/18/2021   NA 142 06/18/2021   K 4.1 06/18/2021   CL 106 06/18/2021   CREATININE 1.28 (H) 06/18/2021   BUN 16 06/18/2021   CO2 30 06/18/2021   TSH 3.090 02/11/2021   INR 1.0 12/14/2018   HGBA1C 5.5 06/18/2021    No results found.  Assessment & Plan:   Problem List Items Addressed This Visit     Acute sinusitis    Start Ceftin po Depo-medrol 80 mg IM      Relevant Medications   cefUROXime (CEFTIN) 500 MG tablet   fluconazole (DIFLUCAN) 150 MG tablet   CAD (coronary artery disease) (Chronic)    No  CP      Cerumen impaction    B ears RTC  to irrigate      LOW BACK PAIN    Cont on Tramadol ER 100 mg qhs (max 300 mg/24 h)  Potential benefits of a long term opioids use as well as potential risks (i.e. addiction risk, apnea etc) and complications (i.e. Somnolence, constipation and others) were explained to the patient and were aknowledged.      Severe uncontrolled hypertension (Chronic)    Pt took a decongestant - elevated BP         Meds ordered this encounter  Medications   cefUROXime (CEFTIN) 500 MG tablet    Sig: Take 1 tablet (500 mg total) by mouth 2 (two) times daily with a meal for 10 days.    Dispense:  20 tablet    Refill:  0   fluconazole (DIFLUCAN) 150 MG tablet    Sig: Take 1 tablet (150 mg total) by mouth once for 1 dose.    Dispense:  1 tablet    Refill:  1      Follow-up: Return in about 3 months (around 03/23/2022) for a follow-up visit.  Walker Kehr, MD

## 2022-01-06 ENCOUNTER — Other Ambulatory Visit: Payer: Self-pay | Admitting: Internal Medicine

## 2022-01-07 MED ORDER — TRAMADOL HCL ER 200 MG PO TB24: 200.0000 mg | ORAL_TABLET | Freq: Every morning | ORAL | 2 refills | Status: DC

## 2022-01-11 DIAGNOSIS — G4733 Obstructive sleep apnea (adult) (pediatric): Secondary | ICD-10-CM | POA: Diagnosis not present

## 2022-01-12 ENCOUNTER — Other Ambulatory Visit: Payer: Self-pay | Admitting: Internal Medicine

## 2022-01-13 DIAGNOSIS — G4733 Obstructive sleep apnea (adult) (pediatric): Secondary | ICD-10-CM | POA: Diagnosis not present

## 2022-02-11 DIAGNOSIS — G4733 Obstructive sleep apnea (adult) (pediatric): Secondary | ICD-10-CM | POA: Diagnosis not present

## 2022-02-14 ENCOUNTER — Other Ambulatory Visit: Payer: Self-pay | Admitting: Internal Medicine

## 2022-03-12 ENCOUNTER — Other Ambulatory Visit: Payer: Self-pay | Admitting: *Deleted

## 2022-03-12 MED ORDER — ESOMEPRAZOLE MAGNESIUM 40 MG PO CPDR: DELAYED_RELEASE_CAPSULE | ORAL | 3 refills | Status: DC

## 2022-03-13 DIAGNOSIS — G4733 Obstructive sleep apnea (adult) (pediatric): Secondary | ICD-10-CM | POA: Diagnosis not present

## 2022-03-24 ENCOUNTER — Ambulatory Visit: Payer: Medicare Other | Admitting: Internal Medicine

## 2022-03-26 ENCOUNTER — Ambulatory Visit (INDEPENDENT_AMBULATORY_CARE_PROVIDER_SITE_OTHER): Payer: Medicare Other | Admitting: Internal Medicine

## 2022-03-26 ENCOUNTER — Encounter: Payer: Self-pay | Admitting: Internal Medicine

## 2022-03-26 VITALS — BP 130/76 | HR 88 | Temp 98.4°F | Ht 64.0 in | Wt 163.0 lb

## 2022-03-26 DIAGNOSIS — I2510 Atherosclerotic heart disease of native coronary artery without angina pectoris: Secondary | ICD-10-CM | POA: Diagnosis not present

## 2022-03-26 DIAGNOSIS — J449 Chronic obstructive pulmonary disease, unspecified: Secondary | ICD-10-CM

## 2022-03-26 DIAGNOSIS — E785 Hyperlipidemia, unspecified: Secondary | ICD-10-CM

## 2022-03-26 DIAGNOSIS — M545 Low back pain, unspecified: Secondary | ICD-10-CM

## 2022-03-26 DIAGNOSIS — F17200 Nicotine dependence, unspecified, uncomplicated: Secondary | ICD-10-CM | POA: Diagnosis not present

## 2022-03-26 DIAGNOSIS — G8929 Other chronic pain: Secondary | ICD-10-CM

## 2022-03-26 DIAGNOSIS — N184 Chronic kidney disease, stage 4 (severe): Secondary | ICD-10-CM

## 2022-03-26 LAB — LIPID PANEL
Cholesterol: 255 mg/dL — ABNORMAL HIGH (ref 0–200)
HDL: 46.6 mg/dL (ref >39.00)
NonHDL: 208.32
Total CHOL/HDL Ratio: 5
Triglycerides: 232 mg/dL — ABNORMAL HIGH (ref 0.0–149.0)
VLDL: 46.4 mg/dL — ABNORMAL HIGH (ref 0.0–40.0)

## 2022-03-26 LAB — COMPREHENSIVE METABOLIC PANEL
ALT: 19 U/L (ref 0–35)
AST: 17 U/L (ref 0–37)
Albumin: 4.5 g/dL (ref 3.5–5.2)
Alkaline Phosphatase: 93 U/L (ref 39–117)
BUN: 21 mg/dL (ref 6–23)
CO2: 27 mEq/L (ref 19–32)
Calcium: 10.3 mg/dL (ref 8.4–10.5)
Chloride: 102 mEq/L (ref 96–112)
Creatinine, Ser: 1.51 mg/dL — ABNORMAL HIGH (ref 0.40–1.20)
GFR: 37.49 mL/min — ABNORMAL LOW (ref >60.00)
Glucose, Bld: 102 mg/dL — ABNORMAL HIGH (ref 70–99)
Potassium: 3.9 mEq/L (ref 3.5–5.1)
Sodium: 141 mEq/L (ref 135–145)
Total Bilirubin: 0.2 mg/dL (ref 0.2–1.2)
Total Protein: 8 g/dL (ref 6.0–8.3)

## 2022-03-26 LAB — LDL CHOLESTEROL, DIRECT: Direct LDL: 148 mg/dL

## 2022-03-26 MED ORDER — CARVEDILOL 12.5 MG PO TABS: 12.5000 mg | ORAL_TABLET | Freq: Two times a day (BID) | ORAL | 3 refills | Status: DC

## 2022-03-26 MED ORDER — ALBUTEROL SULFATE HFA 108 (90 BASE) MCG/ACT IN AERS: 2.0000 | INHALATION_SPRAY | RESPIRATORY_TRACT | 5 refills | Status: AC | PRN

## 2022-03-26 MED ORDER — CYCLOBENZAPRINE HCL 5 MG PO TABS: 5.0000 mg | ORAL_TABLET | Freq: Every day | ORAL | 2 refills | Status: DC

## 2022-03-26 NOTE — Assessment & Plan Note (Signed)
We will monitor GFR. Hydrate well

## 2022-03-26 NOTE — Patient Instructions (Signed)
Try vitamin k2 and mk7

## 2022-03-26 NOTE — Assessment & Plan Note (Addendum)
Not using MDI Mom smokes in the house all the time Albuterol MDI prn

## 2022-03-26 NOTE — Assessment & Plan Note (Signed)
Cont on Tramadol ER 200 mg qhs (max 300 mg/24 h)  Potential benefits of a long term opioids use as well as potential risks (i.e. addiction risk, apnea etc) and complications (i.e. Somnolence, constipation and others) were explained to the patient and were aknowledged.

## 2022-03-26 NOTE — Assessment & Plan Note (Addendum)
No angina Cont w/current Rx Mom smokes in the house all the time Try vitamin k2 and mk7

## 2022-03-26 NOTE — Progress Notes (Signed)
Subjective:  Patient ID: Hayley Miller, female    DOB: 24-Dec-1960  Age: 61 y.o. MRN: [id number]  CC: Follow-up (Have some chest congestion)   HPI Hayley Miller presents for chronic pain, CAD, CRI C/o URI sx's  Outpatient Medications Prior to Visit  Medication Sig Dispense Refill   acetaminophen (TYLENOL) 325 MG tablet Take 650 mg by mouth every 8 (eight) hours as needed for headache, moderate pain, mild pain or fever.     aspirin EC 81 MG tablet Take 81 mg by mouth daily.     cetirizine (ZYRTEC) 10 MG tablet Take 1 tablet (10 mg total) by mouth daily. (Patient taking differently: Take 10 mg by mouth daily as needed.) 30 tablet 0   esomeprazole (NEXIUM) 40 MG capsule Take 1 capsule at 12:00 noon each [place]. 90 capsule 3   fluconazole (DIFLUCAN) 150 MG tablet Take 150 mg by mouth once.     fluticasone (FLONASE) 50 MCG/ACT nasal spray USE 2 SPRAYS EACH NOSTRIL ONCE A [place] AS NEEDED FOR ALLERGIES OR CONGESTION. 16 g 2   hydrALAZINE (APRESOLINE) 25 MG tablet TAKE 1 TABLET ('25MG'$  TOTAL) 3 TIMES DAILY AS NEEDED. TAKE IF BLOOD PRESSURE IS GREATER THAN 150/100. 270 tablet 3   Lidocaine (LIDODERM EX) Apply 4 % topically daily. 12 hours on and 12 hours off     Multiple Vitamin (MULTIVITAMIN WITH MINERALS) TABS tablet Take 1 tablet by mouth daily.     nitroGLYCERIN (NITROSTAT) 0.4 MG SL tablet Place 1 tablet (0.4 mg total) under the tongue every 5 (five) minutes as needed for chest pain. 20 tablet 2   Tetrahydrozoline HCl (VISINE OP) Place 1 drop into both eyes daily as needed (dry eyes).     traMADol (ULTRAM-ER) 100 MG 24 hr tablet Take 1 tablet (100 mg total) by mouth at bedtime. 30 tablet 2   traMADol (ULTRAM-ER) 200 MG 24 hr tablet Take 1 tablet (200 mg total) by mouth every morning. 30 tablet 2   carvedilol (COREG) 12.5 MG tablet Take 1 tablet (12.5 mg total) by mouth 2 (two) times daily. 180 tablet 3   cyclobenzaprine (FLEXERIL) 5 MG tablet TAKE ONE TABLET BY MOUTH AT BEDTIME 30 tablet 2    losartan (COZAAR) 100 MG tablet Take 100 mg by mouth daily. Take 1 by mouth daily (Patient not taking: Reported on 03/26/2022)     NIFEdipine (PROCARDIA XL/NIFEDICAL XL) 60 MG 24 hr tablet Take 1 tablet (60 mg total) by mouth daily. (Patient not taking: Reported on 03/26/2022) 90 tablet 3   rosuvastatin (CRESTOR) 20 MG tablet Take 1 tablet (20 mg total) by mouth daily. (Patient not taking: Reported on 03/26/2022) 90 tablet 3   No facility-administered medications prior to visit.    ROS: Review of Systems  Constitutional:  Positive for fatigue. Negative for activity change, appetite change, chills and unexpected weight change.  HENT:  Negative for congestion, mouth sores and sinus pressure.   Eyes:  Negative for visual disturbance.  Respiratory:  Positive for cough. Negative for chest tightness.   Cardiovascular:  Negative for chest pain and palpitations.  Gastrointestinal:  Negative for abdominal pain and nausea.  Genitourinary:  Negative for difficulty urinating, frequency and vaginal pain.  Musculoskeletal:  Positive for arthralgias and back pain. Negative for gait problem.  Skin:  Negative for pallor and rash.  Neurological:  Negative for dizziness, tremors, weakness, numbness and headaches.  Psychiatric/Behavioral:  Negative for confusion and sleep disturbance.     Objective:  BP 130/76 (  BP Location: Right Arm, Patient Position: Sitting, Cuff Size: Normal)   Pulse 88   Temp 98.4 F (36.9 C) (Oral)   Ht '5\' 4"'$  (1.626 m)   Wt 163 lb (73.9 kg)   LMP 06/07/2012   SpO2 93%   BMI 27.98 kg/m   BP Readings from Last 3 Encounters:  03/26/22 130/76  12/22/21 (!) 158/110  09/21/21 120/70    Wt Readings from Last 3 Encounters:  03/26/22 163 lb (73.9 kg)  12/22/21 157 lb 12.8 oz (71.6 kg)  09/21/21 165 lb (74.8 kg)    Physical Exam Constitutional:      General: She is not in acute distress.    Appearance: She is well-developed.  HENT:     Head: Normocephalic.     Right Ear:  External ear normal.     Left Ear: External ear normal.     Nose: Nose normal.  Eyes:     General:        Right eye: No discharge.        Left eye: No discharge.     Conjunctiva/sclera: Conjunctivae normal.     Pupils: Pupils are equal, round, and reactive to light.  Neck:     Thyroid: No thyromegaly.     Vascular: No JVD.     Trachea: No tracheal deviation.  Cardiovascular:     Rate and Rhythm: Normal rate and regular rhythm.     Heart sounds: Normal heart sounds.  Pulmonary:     Effort: No respiratory distress.     Breath sounds: No stridor. No wheezing or rhonchi.  Abdominal:     General: Bowel sounds are normal. There is no distension.     Palpations: Abdomen is soft. There is no mass.     Tenderness: There is no abdominal tenderness. There is no guarding or rebound.  Musculoskeletal:        General: Tenderness present.     Cervical back: Normal range of motion. Rigidity and tenderness present.  Lymphadenopathy:     Cervical: No cervical adenopathy.  Skin:    Findings: No erythema or rash.  Neurological:     Mental Status: She is oriented to person, place, and time.     Cranial Nerves: No cranial nerve deficit.     Motor: No abnormal muscle tone.     Coordination: Coordination normal.     Deep Tendon Reflexes: Reflexes normal.  Psychiatric:        Behavior: Behavior normal.        Thought Content: Thought content normal.        Judgment: Judgment normal.   LS w/pain  Lab Results  Component Value Date   WBC 10.8 (H) 10/29/2020   HGB 14.0 10/29/2020   HCT 42.4 10/29/2020   PLT 409.0 (H) 10/29/2020   GLUCOSE 97 06/18/2021   CHOL 198 02/11/2021   TRIG 194 (H) 02/11/2021   HDL 42 02/11/2021   LDLDIRECT 117.0 04/02/2019   LDLCALC 122 (H) 02/11/2021   ALT 15 06/18/2021   AST 17 06/18/2021   NA 142 06/18/2021   K 4.1 06/18/2021   CL 106 06/18/2021   CREATININE 1.28 (H) 06/18/2021   BUN 16 06/18/2021   CO2 30 06/18/2021   TSH 3.090 02/11/2021   INR 1.0  12/14/2018   HGBA1C 5.5 06/18/2021    No results found.  Assessment & Plan:   Problem List Items Addressed This Visit     Tobacco dependence - Primary    Pt quit  smoking in June 2022      LOW BACK PAIN    Cont on Tramadol ER 200 mg qhs (max 300 mg/24 h)  Potential benefits of a long term opioids use as well as potential risks (i.e. addiction risk, apnea etc) and complications (i.e. Somnolence, constipation and others) were explained to the patient and were aknowledged.      Relevant Medications   cyclobenzaprine (FLEXERIL) 5 MG tablet   Dyslipidemia   COPD (chronic obstructive pulmonary disease) (HCC)    Not using MDI Mom smokes in the house all the time Albuterol MDI prn      Relevant Medications   albuterol (VENTOLIN HFA) 108 (90 Base) MCG/ACT inhaler   CKD (chronic kidney disease) stage 4, GFR 15-29 ml/min (HCC)    We will monitor GFR. Hydrate well      CAD (coronary artery disease) (Chronic)    No angina Cont w/current Rx Mom smokes in the house all the time Try vitamin k2 and mk7      Relevant Medications   carvedilol (COREG) 12.5 MG tablet   Other Relevant Orders   Comprehensive metabolic panel   Lipid panel      Meds ordered this encounter  Medications   cyclobenzaprine (FLEXERIL) 5 MG tablet    Sig: Take 1 tablet (5 mg total) by mouth at bedtime.    Dispense:  30 tablet    Refill:  2   albuterol (VENTOLIN HFA) 108 (90 Base) MCG/ACT inhaler    Sig: Inhale 2 puffs into the lungs every 4 (four) hours as needed for wheezing or shortness of breath.    Dispense:  8 g    Refill:  5   carvedilol (COREG) 12.5 MG tablet    Sig: Take 1 tablet (12.5 mg total) by mouth 2 (two) times daily.    Dispense:  180 tablet    Refill:  3      Follow-up: Return in about 3 months (around 06/25/2022) for a follow-up visit.  Walker Kehr, MD

## 2022-03-26 NOTE — Assessment & Plan Note (Signed)
Pt quit smoking in June 2022

## 2022-04-17 ENCOUNTER — Other Ambulatory Visit: Payer: Self-pay | Admitting: Internal Medicine

## 2022-05-18 ENCOUNTER — Telehealth: Payer: Self-pay

## 2022-05-18 NOTE — Telephone Encounter (Signed)
Contacted Brucha Erny [facility] to schedule their annual wellness visit. Appointment made for 05/19/22.   Norton Blizzard, [place] ([facility])  [facility] [phone]

## 2022-05-19 ENCOUNTER — Ambulatory Visit (INDEPENDENT_AMBULATORY_CARE_PROVIDER_SITE_OTHER): Payer: Medicare Other

## 2022-05-19 VITALS — Ht 64.0 in | Wt 163.0 lb

## 2022-05-19 DIAGNOSIS — Z0000 Encounter for general adult medical examination without abnormal findings: Secondary | ICD-10-CM

## 2022-05-19 DIAGNOSIS — Z79899 Other long term (current) drug therapy: Secondary | ICD-10-CM

## 2022-05-19 NOTE — Patient Instructions (Addendum)
Hayley Miller , Thank you for taking time to come for your Medicare Wellness Visit. I appreciate your ongoing commitment to your health goals. Please review the following plan we discussed and let me know if I can assist you in the future.   These are the goals we discussed:  Goals   None     This is a list of the screening recommended for you and due dates:  Health Maintenance  Topic Date Due   Hepatitis C Screening: USPSTF Recommendation to screen - Ages 32-79 yo.  Never done   Pap Smear  07/01/2014   Colon Cancer Screening  11/01/2018   Mammogram  12/28/2020   Medicare Annual Wellness Visit  05/20/2023   DTaP/Tdap/Td vaccine (2 - Td or Tdap) 03/11/2024   HIV Screening  Completed   HPV Vaccine  Aged Out   Flu Shot  Discontinued   COVID-19 Vaccine  Discontinued   Zoster (Shingles) Vaccine  Discontinued    Advanced directives: No  Conditions/risks identified: Yes; Medication Adherence  Next appointment: Follow up in one year for your annual wellness visit.   Preventive Care 40-64 Years, Female Preventive care refers to lifestyle choices and visits with your health care provider that can promote health and wellness. What does preventive care include? A yearly physical exam. This is also called an annual well check. Dental exams once or twice a year. Routine eye exams. Ask your health care provider how often you should have your eyes checked. Personal lifestyle choices, including: Daily care of your teeth and gums. Regular physical activity. Eating a healthy diet. Avoiding tobacco and drug use. Limiting alcohol use. Practicing safe sex. Taking low-dose aspirin daily starting at age 20. Taking vitamin and mineral supplements as recommended by your health care provider. What happens during an annual well check? The services and screenings done by your health care provider during your annual well check will depend on your age, overall health, lifestyle risk factors, and family  history of disease. Counseling  Your health care provider may ask you questions about your: Alcohol use. Tobacco use. Drug use. Emotional well-being. Home and relationship well-being. Sexual activity. Eating habits. Work and work Statistician. Method of birth control. Menstrual cycle. Pregnancy history. Screening  You may have the following tests or measurements: Height, weight, and BMI. Blood pressure. Lipid and cholesterol levels. These may be checked every 5 years, or more frequently if you are over 85 years old. Skin check. Lung cancer screening. You may have this screening every year starting at age 38 if you have a 30-pack-year history of smoking and currently smoke or have quit within the past 15 years. Fecal occult blood test (FOBT) of the stool. You may have this test every year starting at age 25. Flexible sigmoidoscopy or colonoscopy. You may have a sigmoidoscopy every 5 years or a colonoscopy every 10 years starting at age 45. Hepatitis C blood test. Hepatitis B blood test. Sexually transmitted disease (STD) testing. Diabetes screening. This is done by checking your blood sugar (glucose) after you have not eaten for a while (fasting). You may have this done every 1-3 years. Mammogram. This may be done every 1-2 years. Talk to your health care provider about when you should start having regular mammograms. This may depend on whether you have a family history of breast cancer. BRCA-related cancer screening. This may be done if you have a family history of breast, ovarian, tubal, or peritoneal cancers. Pelvic exam and Pap test. This may be done  every 3 years starting at age 55. Starting at age 31, this may be done every 5 years if you have a Pap test in combination with an HPV test. Bone density scan. This is done to screen for osteoporosis. You may have this scan if you are at high risk for osteoporosis. Discuss your test results, treatment options, and if necessary, the need  for more tests with your health care provider. Vaccines  Your health care provider may recommend certain vaccines, such as: Influenza vaccine. This is recommended every year. Tetanus, diphtheria, and acellular pertussis (Tdap, Td) vaccine. You may need a Td booster every 10 years. Zoster vaccine. You may need this after age 88. Pneumococcal 13-valent conjugate (PCV13) vaccine. You may need this if you have certain conditions and were not previously vaccinated. Pneumococcal polysaccharide (PPSV23) vaccine. You may need one or two doses if you smoke cigarettes or if you have certain conditions. Talk to your health care provider about which screenings and vaccines you need and how often you need them. This information is not intended to replace advice given to you by your health care provider. Make sure you discuss any questions you have with your health care provider. Document Released: 04/11/2015 Document Revised: 12/03/2015 Document Reviewed: 01/14/2015 Elsevier Interactive Patient Education  2017 [employer] Prevention in the Home Falls can cause injuries. They can happen to people of all ages. There are many things you can do to make your home safe and to help prevent falls. What can I do on the outside of my home? Regularly fix the edges of walkways and driveways and fix any cracks. Remove anything that might make you trip as you walk through a door, such as a raised step or threshold. Trim any bushes or trees on the path to your home. Use bright outdoor lighting. Clear any walking paths of anything that might make someone trip, such as rocks or tools. Regularly check to see if handrails are loose or broken. Make sure that both sides of any steps have handrails. Any raised decks and porches should have guardrails on the edges. Have any leaves, snow, or ice cleared regularly. Use sand or salt on walking paths during winter. Clean up any spills in your garage right away. This  includes oil or grease spills. What can I do in the bathroom? Use night lights. Install grab bars by the toilet and in the tub and shower. Do not use towel bars as grab bars. Use non-skid mats or decals in the tub or shower. If you need to sit down in the shower, use a plastic, non-slip stool. Keep the floor dry. Clean up any water that spills on the floor as soon as it happens. Remove soap buildup in the tub or shower regularly. Attach bath mats securely with double-sided non-slip rug tape. Do not have throw rugs and other things on the floor that can make you trip. What can I do in the bedroom? Use night lights. Make sure that you have a light by your bed that is easy to reach. Do not use any sheets or blankets that are too big for your bed. They should not hang down onto the floor. Have a firm chair that has side arms. You can use this for support while you get dressed. Do not have throw rugs and other things on the floor that can make you trip. What can I do in the kitchen? Clean up any spills right away. Avoid walking on  wet floors. Keep items that you use a lot in easy-to-reach places. If you need to reach something above you, use a strong step stool that has a grab bar. Keep electrical cords out of the way. Do not use floor polish or wax that makes floors slippery. If you must use wax, use non-skid floor wax. Do not have throw rugs and other things on the floor that can make you trip. What can I do with my stairs? Do not leave any items on the stairs. Make sure that there are handrails on both sides of the stairs and use them. Fix handrails that are broken or loose. Make sure that handrails are as long as the stairways. Check any carpeting to make sure that it is firmly attached to the stairs. Fix any carpet that is loose or worn. Avoid having throw rugs at the top or bottom of the stairs. If you do have throw rugs, attach them to the floor with carpet tape. Make sure that you  have a light switch at the top of the stairs and the bottom of the stairs. If you do not have them, ask someone to add them for you. What else can I do to help prevent falls? Wear shoes that: Do not have high heels. Have rubber bottoms. Are comfortable and fit you well. Are closed at the toe. Do not wear sandals. If you use a stepladder: Make sure that it is fully opened. Do not climb a closed stepladder. Make sure that both sides of the stepladder are locked into place. Ask someone to hold it for you, if possible. Clearly mark and make sure that you can see: Any grab bars or handrails. First and last steps. Where the edge of each step is. Use tools that help you move around (mobility aids) if they are needed. These include: Canes. Walkers. Scooters. Crutches. Turn on the lights when you go into a dark area. Replace any light bulbs as soon as they burn out. Set up your furniture so you have a clear path. Avoid moving your furniture around. If any of your floors are uneven, fix them. If there are any pets around you, be aware of where they are. Review your medicines with your doctor. Some medicines can make you feel dizzy. This can increase your chance of falling. Ask your doctor what other things that you can do to help prevent falls. This information is not intended to replace advice given to you by your health care provider. Make sure you discuss any questions you have with your health care provider. Document Released: 01/09/2009 Document Revised: 08/21/2015 Document Reviewed: 04/19/2014 Elsevier Interactive Patient Education  2017 [employer].

## 2022-05-19 NOTE — Progress Notes (Addendum)
I connected with  Hayley Miller on 05/19/2022 at 1:30 p.m. EST by telephone and verified that I am speaking with the correct person using two identifiers.  Location: Patient: Home Provider: [place] Persons participating in the virtual visit: [place]   I discussed the limitations, risks, security and privacy concerns of performing an evaluation and management service by telephone and the availability of in person appointments. The patient expressed understanding and agreed to proceed.  Interactive audio and video telecommunications were attempted between this nurse and patient, however failed, due to patient having technical difficulties OR patient did not have access to video capability.  We continued and completed visit with audio only.  Some vital signs may be absent or patient reported.   Sheral Flow, LPN Subjective:   Hayley Miller is a 62 y.o. female who presents for an Initial Medicare Annual Wellness Visit.  Review of Systems     Cardiac Risk Factors include: dyslipidemia;family history of premature cardiovascular disease;hypertension;sedentary lifestyle     Objective:    Today's Vitals   05/19/22 1332  Weight: 163 lb (73.9 kg)  Height: '5\' 4"'$  (1.626 m)  PainSc: 0-No pain   Body mass index is 27.98 kg/m.     05/19/2022    1:35 PM 04/21/2021    8:52 PM 01/16/2019    4:53 PM 12/14/2018   11:50 PM 12/14/2018    6:19 PM 09/22/2017    7:44 PM 07/20/2017   12:10 AM  Advanced Directives  Does Patient Have a Medical Advance Directive? No No No No No No No  Would patient like information on creating a medical advance directive? No - Patient declined No - Patient declined  No - Patient declined  No - Patient declined Yes (Inpatient - patient defers creating a medical advance directive at this time)    Current Medications (verified) Outpatient Encounter Medications as of 05/19/2022  Medication Sig   acetaminophen (TYLENOL) 325 MG tablet  Take 650 mg by mouth every 8 (eight) hours as needed for headache, moderate pain, mild pain or fever.   albuterol (VENTOLIN HFA) 108 (90 Base) MCG/ACT inhaler Inhale 2 puffs into the lungs every 4 (four) hours as needed for wheezing or shortness of breath.   aspirin EC 81 MG tablet Take 81 mg by mouth daily.   carvedilol (COREG) 12.5 MG tablet Take 1 tablet (12.5 mg total) by mouth 2 (two) times daily.   cetirizine (ZYRTEC) 10 MG tablet Take 1 tablet (10 mg total) by mouth daily. (Patient taking differently: Take 10 mg by mouth daily as needed.)   cyclobenzaprine (FLEXERIL) 5 MG tablet Take 1 tablet (5 mg total) by mouth at bedtime.   esomeprazole (NEXIUM) 40 MG capsule Take 1 capsule at 12:00 noon each [place].   fluconazole (DIFLUCAN) 150 MG tablet Take 150 mg by mouth once.   fluticasone (FLONASE) 50 MCG/ACT nasal spray USE 2 SPRAYS EACH NOSTRIL ONCE A [place] AS NEEDED FOR ALLERGIES OR CONGESTION.   hydrALAZINE (APRESOLINE) 25 MG tablet TAKE 1 TABLET ('25MG'$  TOTAL) 3 TIMES DAILY AS NEEDED. TAKE IF BLOOD PRESSURE IS GREATER THAN 150/100.   Lidocaine (LIDODERM EX) Apply 4 % topically daily. 12 hours on and 12 hours off   losartan (COZAAR) 100 MG tablet Take 100 mg by mouth daily. Take 1 by mouth daily (Patient not taking: Reported on 03/26/2022)   Multiple Vitamin (MULTIVITAMIN WITH MINERALS) TABS tablet Take 1 tablet by mouth daily.   NIFEdipine (PROCARDIA XL/NIFEDICAL XL) 60 MG 24  hr tablet Take 1 tablet (60 mg total) by mouth daily. (Patient not taking: Reported on 03/26/2022)   nitroGLYCERIN (NITROSTAT) 0.4 MG SL tablet Place 1 tablet (0.4 mg total) under the tongue every 5 (five) minutes as needed for chest pain.   rosuvastatin (CRESTOR) 20 MG tablet Take 1 tablet (20 mg total) by mouth daily. (Patient not taking: Reported on 03/26/2022)   Tetrahydrozoline HCl (VISINE OP) Place 1 drop into both eyes daily as needed (dry eyes).   traMADol (ULTRAM-ER) 100 MG 24 hr tablet Take 1 tablet (100 mg total) by  mouth at bedtime.   traMADol (ULTRAM-ER) 200 MG 24 hr tablet Take 1 tablet (200 mg total) by mouth every morning.   No facility-administered encounter medications on file as of 05/19/2022.    Allergies (verified) Amlodipine, Clonidine hydrochloride, Coreg [carvedilol], Gabapentin, Ibuprofen, Lovastatin, and Spironolactone   History: Past Medical History:  Diagnosis Date   Allergy    Anemia    Anxiety    CAD (coronary artery disease)    a. s/p AL MI in 2006 tx with PTCA to LAD;  b. LHC (10/2009): lum irregs in LAD, o/w no CAD, EF 55-60%;  c. Myoview (07/2011):  no ischemia, EF 63%;  d. Echo (11/2011):  mild LVH, EF 60-65%, Gr 1 DD;  e.  Lexiscan Myoview (04/2013):  Inferior bowel atten artifact with inferoapical defect; no ischemia; EF 58% (low risk)   [place]on lesion, acute    Chronic lower back pain    Chronic pain syndrome    Daily headache    Depression    Erosive esophagitis    Fibromyalgia    GERD (gastroesophageal reflux disease)    Hemorrhage    upper gastrointestional. GI bleed 10/09 due to NSAID   Hiatal hernia    HLD (hyperlipidemia)    HTN (hypertension)    Myocardial infarction ([place]) 2006 X 2   Rhinitis    Seasonal allergies    Past Surgical History:  Procedure Laterality Date   BLADDER SUSPENSION N/A 05/08/2013   Procedure: TRANSVAGINAL TAPE (TVT) PROCEDURE;  Surgeon: Olga Millers, MD;  Location: [facility];  Service: Gynecology;  Laterality: N/A;   BREAST CYST EXCISION Left 2002   CARDIAC CATHETERIZATION  07/19/2017   COLONOSCOPY     CORONARY ANGIOPLASTY  2006   CYSTOSCOPY N/A 05/08/2013   Procedure: CYSTOSCOPY;  Surgeon: Olga Millers, MD;  Location: [facility];  Service: Gynecology;  Laterality: N/A;   LAPAROSCOPIC ASSISTED VAGINAL HYSTERECTOMY N/A 05/08/2013   Procedure: LAPAROSCOPIC ASSISTED VAGINAL HYSTERECTOMY;  Surgeon: Olga Millers, MD;  Location: [facility];  Service: Gynecology;  Laterality: N/A;   LAPAROSCOPIC BILATERAL SALPINGO OOPHERECTOMY N/A 05/08/2013    Procedure: LAPAROSCOPIC BILATERAL SALPINGO OOPHORECTOMY;  Surgeon: Olga Millers, MD;  Location: [facility];  Service: Gynecology;  Laterality: N/A;   LEFT HEART CATH AND CORONARY ANGIOGRAPHY N/A 07/19/2017   Procedure: LEFT HEART CATH AND CORONARY ANGIOGRAPHY;  Surgeon: Martinique, Peter M, MD;  Location: [facility];  Service: Cardiovascular;  Laterality: N/A;   UPPER GASTROINTESTINAL ENDOSCOPY     Family History  Problem Relation Age of Onset   Hypertension Mother    Heart murmur Mother    Colon polyps Mother    Colon cancer Maternal Aunt    Heart disease Son    Heart disease Maternal Grandmother    Diabetes Maternal Aunt    Esophageal cancer Neg Hx    Rectal cancer Neg Hx    Stomach cancer Neg Hx  Breast cancer Neg Hx    Social History   Socioeconomic History   Marital status: Divorced    Spouse name: Not on file   Number of children: 1   Years of education: Not on file   Highest education level: Not on file  Occupational History   Occupation: unemployed    Employer: UNEMPLOYED  Tobacco Use   Smoking status: Former    Packs/Peavler: 1.00    Years: 3.00    Total pack years: 3.00    Types: Cigarettes    Quit date: 04/17/2017    Years since quitting: 5.0   Smokeless tobacco: Never   Tobacco comments:    Divorced, lives with female domestic partner  Vaping Use   Vaping Use: Never used  Substance and Sexual Activity   Alcohol use: Not Currently    Comment: 07/20/2017 "might drink once or twice/year"   Drug use: No   Sexual activity: Yes    Birth control/protection: Other-see comments    Comment: has a female domestic partner  Other Topics Concern   Not on file  Social History Narrative   Lives alone - divorced; 1 child.    Used to live with female domestic partner   Unemployed - disability.          Social Determinants of Health   Financial Resource Strain: Low Risk  (05/19/2022)   Overall Financial Resource Strain (CARDIA)    Difficulty of Paying Living  Expenses: Not hard at all  Food Insecurity: No Food Insecurity (05/19/2022)   Hunger Vital Sign    Worried About Running Out of Food in the Last Year: Never true    Ran Out of Food in the Last Year: Never true  Transportation Needs: No Transportation Needs (05/19/2022)   PRAPARE - Hydrologist (Medical): No    Lack of Transportation (Non-Medical): No  Physical Activity: Inactive (05/19/2022)   Exercise Vital Sign    Days of Exercise per Week: 0 days    Minutes of Exercise per Session: 0 min  Stress: No Stress Concern Present (05/19/2022)   [place]    Feeling of Stress : Not at all  Social Connections: Unknown (05/19/2022)   Social Connection and Isolation Panel [NHANES]    Frequency of Communication with Friends and Family: Patient refused    Frequency of Social Gatherings with Friends and Family: Patient refused    Attends Religious Services: Patient refused    Marine scientist or Organizations: Not on file    Attends Archivist Meetings: Patient refused    Marital Status: Patient refused    Tobacco Counseling Counseling given: Not Answered Tobacco comments: Divorced, lives with female domestic partner   Clinical Intake:  Pre-visit preparation completed: Yes  Pain : No/denies pain Pain Score: 0-No pain     BMI - recorded: 27.98 Nutritional Risks: None Diabetes: No  How often do you need to have someone help you when you read instructions, pamphlets, or other written materials from your doctor or pharmacy?: 1 - Never What is the last grade level you completed in school?: HSG  Diabetic? No  Interpreter Needed?: No  Information entered by :: Lisette Abu, LPN.   Activities of Daily Living    05/19/2022    1:39 PM  In your present state of health, do you have any difficulty performing the following activities:  Hearing? 0  Vision? 0  Difficulty  concentrating or  making decisions? 0  Walking or climbing stairs? 0  Dressing or bathing? 0  Doing errands, shopping? 0  Preparing Food and eating ? N  Using the Toilet? N  In the past six months, have you accidently leaked urine? N  Do you have problems with loss of bowel control? N  Managing your Medications? N  Managing your Finances? N  Housekeeping or managing your Housekeeping? N    Patient Care Team: Plotnikov, Evie Lacks, MD as PCP - General (Internal Medicine) Fay Records, MD as PCP - Cardiology (Cardiology) Lorretta Harp, MD as PCP - [facility] Cardiology (Cardiology) Sueanne Margarita, MD as PCP - Sleep Medicine (Cardiology) Olga Millers, MD (Obstetrics and Gynecology) Ladene Artist, MD (Gastroenterology) Gus Height, MD (Inactive) (Obstetrics and Gynecology) Delice Bison Darnelle Maffucci, [facility] (Inactive) as Pharmacist (Pharmacist)  Indicate any recent Medical Services you may have received from other than [facility] providers in the past year (date may be approximate).     Assessment:   This is a routine wellness examination for [place].  Hearing/Vision screen Hearing Screening - Comments:: Denies hearing difficulties   Vision Screening - Comments:: Wears rx glasses - up to date with routine eye exams with [facility]    Dietary issues and exercise activities discussed: Current Exercise Habits: The patient does not participate in regular exercise at present, Exercise limited by: respiratory conditions(s)   Goals Addressed   None   Depression Screen    05/19/2022    1:36 PM 03/26/2022    3:07 PM 09/21/2021    1:30 PM 04/30/2020    3:22 PM  PHQ 2/9 Scores  PHQ - 2 Score 0 0 0 0  PHQ- 9 Score 6 3  0    Fall Risk    05/19/2022    1:36 PM 03/26/2022    3:07 PM 09/21/2021    1:30 PM 04/30/2020    3:23 PM  Fall Risk   Falls in the past year? 0 0 0 0  Number falls in past yr: 0 0 0 0  Injury with Fall? 0 0 0 0  Risk for fall due to : No Fall Risks No Fall Risks No  Fall Risks No Fall Risks  Follow up Falls prevention discussed Falls evaluation completed Falls evaluation completed     Lillie:  Any stairs in or around the home? No  If so, are there any without handrails? No  Home free of loose throw rugs in walkways, pet beds, electrical cords, etc? Yes  Adequate lighting in your home to reduce risk of falls? Yes   ASSISTIVE DEVICES UTILIZED TO PREVENT FALLS:  Life alert? No  Use of a cane, walker or w/c? No  Grab bars in the bathroom? No  Shower chair or bench in shower? No  Elevated toilet seat or a handicapped toilet? Yes   TIMED UP AND GO:  Was the test performed? No . Telephonic Visit  Cognitive Function:        05/19/2022    1:36 PM  6CIT Screen  What Year? 0 points  What month? 0 points  What time? 0 points  Count back from 20 0 points  Months in reverse 0 points  Repeat phrase 0 points  Total Score 0 points    Immunizations Immunization History  Administered Date(s) Administered   Influenza Whole 01/12/2008   Influenza,inj,Quad PF,6+ Mos 01/22/2014   Influenza-Unspecified 01/27/2018   Tdap 03/11/2014    TDAP  status: Up to date  Flu Vaccine status: Declined, Education has been provided regarding the importance of this vaccine but patient still declined. Advised may receive this vaccine at local pharmacy or Health Dept. Aware to provide a copy of the vaccination record if obtained from local pharmacy or Health Dept. Verbalized acceptance and understanding.  Pneumococcal vaccine status: Declined,  Education has been provided regarding the importance of this vaccine but patient still declined. Advised may receive this vaccine at local pharmacy or Health Dept. Aware to provide a copy of the vaccination record if obtained from local pharmacy or Health Dept. Verbalized acceptance and understanding.   Covid-19 vaccine status: Declined, Education has been provided regarding the importance of  this vaccine but patient still declined. Advised may receive this vaccine at local pharmacy or Health Dept.or vaccine clinic. Aware to provide a copy of the vaccination record if obtained from local pharmacy or Health Dept. Verbalized acceptance and understanding.  Qualifies for Shingles Vaccine? Yes   Zostavax completed No   Shingrix Completed?: No.    Education has been provided regarding the importance of this vaccine. Patient has been advised to call insurance company to determine out of pocket expense if they have not yet received this vaccine. Advised may also receive vaccine at local pharmacy or Health Dept. Verbalized acceptance and understanding.  Screening Tests Health Maintenance  Topic Date Due   Hepatitis C Screening  Never done   PAP SMEAR-Modifier  07/01/2014   COLONOSCOPY (Pts 45-38yr Insurance coverage will need to be confirmed)  11/01/2018   MAMMOGRAM  12/28/2020   Medicare Annual Wellness (AWV)  05/20/2023   DTaP/Tdap/Td (2 - Td or Tdap) 03/11/2024   HIV Screening  Completed   HPV VACCINES  Aged Out   INFLUENZA VACCINE  Discontinued   COVID-19 Vaccine  Discontinued   Zoster Vaccines- Shingrix  Discontinued    Health Maintenance  Health Maintenance Due  Topic Date Due   Hepatitis C Screening  Never done   PAP SMEAR-Modifier  07/01/2014   COLONOSCOPY (Pts 45-441yrInsurance coverage will need to be confirmed)  11/01/2018   MAMMOGRAM  12/28/2020    Colorectal cancer screening: Patient declined at this time.  Mammogram status: Patient declined at this time.  Bone Density scan: Patient declined at this time.  Lung Cancer Screening: (Low Dose CT Chest recommended if Age 534-80ears, 30 pack-year currently smoking OR have quit w/in 15years.) does not qualify.   Lung Cancer Screening Referral: no  Additional Screening:  Hepatitis C Screening: does qualify; Completed no  Vision Screening: Recommended annual ophthalmology exams for early detection of glaucoma  and other disorders of the eye. Is the patient up to date with their annual eye exam?  Yes  Who is the provider or what is the name of the office in which the patient attends annual eye exams? [facility] If pt is not established with a provider, would they like to be referred to a provider to establish care? No .   Dental Screening: Recommended annual dental exams for proper oral hygiene  Community Resource Referral / Chronic Care Management: CRR required this visit?  No   CCM required this visit?  No      Plan:     I have personally reviewed and noted the following in the patient's chart:   Medical and social history Use of alcohol, tobacco or illicit drugs  Current medications and supplements including opioid prescriptions. Patient is not currently taking opioid prescriptions. Functional ability and  status Nutritional status Physical activity Advanced directives List of other physicians Hospitalizations, surgeries, and ER visits in previous 12 months Vitals Screenings to include cognitive, depression, and falls Referrals and appointments  In addition, I have reviewed and discussed with patient certain preventive protocols, quality metrics, and best practice recommendations. A written personalized care plan for preventive services as well as general preventive health recommendations were provided to patient.     Sheral Flow, LPN   [id number]   Nurse Notes: Normal cognitive status assessed by direct observation by this Nurse Health Advisor. No abnormalities found.    Medical screening examination/treatment/procedure(s) were performed by non-physician practitioner and as supervising physician I was immediately available for consultation/collaboration.  I agree with above. Lew Dawes, MD

## 2022-05-21 ENCOUNTER — Other Ambulatory Visit: Payer: Medicare Other

## 2022-05-24 NOTE — Progress Notes (Addendum)
   05/24/2022  Patient ID: Hayley Miller, female   DOB: November 02, 1960, 62 y.o.   MRN: [id number]  Subjective/Objective: Referral from PCP for medication management and polypharmacy concern.  Contacted patient to do full review of medications. HTN -BP 03/26/22 130/76 -Patient currently taking carvedilol 12.73m twice daily and hydralazine 249mthree times daily if bp >150/100 -Not checking home BP regularly (has two cuffs, though); so patient rarely utilizes hydralazine HLD -03/26/22 TC 255, LDL 148, TG 232 -Previously on rosuvastatin but states it was causing gingival hypertrophy, and lovastatin caused muscle pain Medication management and adherence -Patient endorses excellent adherence to medication regimen -States a refill is needed for her nitroglycerin SL tablets -Has complaints of muscle pain that tramadol is not helping with.  She does have a fibromyalgia diagnosis, so likely associated with that.  She asked about trying Magnesium supplementation.  Assessment/Plan: HTN -Currently controlled (goal <130/80) -Recommend patient check BP at least 3x/week and record HDL -Based on articles around statin use and oral health, there is no correlation between these medications and gingival hyperplasia.  Data actually suggests benefit to oral/dental health. -Patient is in need of lipid lowering medication, and I would suggest initiation of Atorvastatin 4033maily with Lipids and LFT's in 3 months Medication management and adherence -Upon review of medications with patient, no polypharmacy identified.  Several medications are only taken as needed.  All are appropriate based on kidney function (eGFR=37.5).  There are no access or affordability barriers. -Recommend refill be sent to pharmacy for Nitrostat -Patient was going to try magnesium oxide 400m27mily for 1-2 weeks.  Educated her to discontinue if no improvement in muscle pain, as this would indicate it was not related to magnesium levels.   Recommend Mg level when Lipids and LFT's are drawn  CherDarlina GuysarmD, DPLAOkleeeening examination/treatment/procedure(s) were performed by non-physician practitioner and as supervising physician I was immediately available for consultation/collaboration.  I agree with above. AlekLew Dawes

## 2022-05-27 DIAGNOSIS — G4733 Obstructive sleep apnea (adult) (pediatric): Secondary | ICD-10-CM | POA: Diagnosis not present

## 2022-06-24 ENCOUNTER — Ambulatory Visit: Payer: Medicare Other | Admitting: Internal Medicine

## 2022-06-25 DIAGNOSIS — G4733 Obstructive sleep apnea (adult) (pediatric): Secondary | ICD-10-CM | POA: Diagnosis not present

## 2022-06-26 ENCOUNTER — Other Ambulatory Visit: Payer: Self-pay | Admitting: Internal Medicine

## 2022-07-26 DIAGNOSIS — G4733 Obstructive sleep apnea (adult) (pediatric): Secondary | ICD-10-CM | POA: Diagnosis not present

## 2022-08-02 ENCOUNTER — Other Ambulatory Visit: Payer: Self-pay | Admitting: Internal Medicine

## 2022-08-04 ENCOUNTER — Telehealth: Payer: Self-pay

## 2022-08-04 NOTE — Telephone Encounter (Signed)
Pt has stated she is needing a refill for her traMADol (ULTRAM-ER) 200 MG 24 hr tablet   Pt has an apptmnt set for 08/12/2022 with PCP.

## 2022-08-08 MED ORDER — TRAMADOL HCL ER 200 MG PO TB24: 200.0000 mg | ORAL_TABLET | Freq: Every morning | ORAL | 0 refills | Status: DC

## 2022-08-08 MED ORDER — TRAMADOL HCL ER 100 MG PO TB24: 100.0000 mg | ORAL_TABLET | Freq: Every day | ORAL | 0 refills | Status: DC

## 2022-08-08 NOTE — Telephone Encounter (Signed)
Okay.  Keep return office visit.  Thanks 

## 2022-08-08 NOTE — Addendum Note (Signed)
Addended by: Tresa Garter on: 08/08/2022 11:54 PM   Modules accepted: Orders

## 2022-08-12 ENCOUNTER — Ambulatory Visit (INDEPENDENT_AMBULATORY_CARE_PROVIDER_SITE_OTHER): Payer: Medicare Other | Admitting: Internal Medicine

## 2022-08-12 ENCOUNTER — Encounter: Payer: Self-pay | Admitting: Internal Medicine

## 2022-08-12 VITALS — BP 180/138 | HR 91 | Temp 98.6°F | Ht 64.0 in | Wt 156.0 lb

## 2022-08-12 DIAGNOSIS — I10 Essential (primary) hypertension: Secondary | ICD-10-CM

## 2022-08-12 DIAGNOSIS — M797 Fibromyalgia: Secondary | ICD-10-CM

## 2022-08-12 DIAGNOSIS — E785 Hyperlipidemia, unspecified: Secondary | ICD-10-CM

## 2022-08-12 LAB — COMPREHENSIVE METABOLIC PANEL
ALT: 18 U/L (ref 0–35)
AST: 21 U/L (ref 0–37)
Albumin: 4.6 g/dL (ref 3.5–5.2)
Alkaline Phosphatase: 90 U/L (ref 39–117)
BUN: 22 mg/dL (ref 6–23)
CO2: 30 mEq/L (ref 19–32)
Calcium: 11 mg/dL — ABNORMAL HIGH (ref 8.4–10.5)
Chloride: 101 mEq/L (ref 96–112)
Creatinine, Ser: 1.53 mg/dL — ABNORMAL HIGH (ref 0.40–1.20)
GFR: 36.8 mL/min — ABNORMAL LOW (ref >60.00)
Glucose, Bld: 98 mg/dL (ref 70–99)
Potassium: 4.7 mEq/L (ref 3.5–5.1)
Sodium: 139 mEq/L (ref 135–145)
Total Bilirubin: 0.3 mg/dL (ref 0.2–1.2)
Total Protein: 8.6 g/dL — ABNORMAL HIGH (ref 6.0–8.3)

## 2022-08-12 LAB — TSH: TSH: 1.99 u[IU]/mL (ref 0.35–5.50)

## 2022-08-12 LAB — CK: Total CK: 58 U/L (ref 7–177)

## 2022-08-12 LAB — SEDIMENTATION RATE: Sed Rate: 12 mm/hr (ref 0–30)

## 2022-08-12 MED ORDER — CARVEDILOL 25 MG PO TABS: 25.0000 mg | ORAL_TABLET | Freq: Two times a day (BID) | ORAL | 11 refills | Status: DC

## 2022-08-12 MED ORDER — TRAMADOL HCL ER 100 MG PO TB24: 100.0000 mg | ORAL_TABLET | Freq: Every day | ORAL | 2 refills | Status: DC

## 2022-08-12 MED ORDER — TRAMADOL HCL ER 200 MG PO TB24: 200.0000 mg | ORAL_TABLET | Freq: Every morning | ORAL | 2 refills | Status: DC

## 2022-08-12 MED ORDER — HYDRALAZINE HCL 25 MG PO TABS: ORAL_TABLET | ORAL | 3 refills | Status: DC

## 2022-08-12 NOTE — Progress Notes (Signed)
Subjective:  Patient ID: Hayley Miller, female    DOB: 10-08-1960  Age: 62 y.o. MRN: [id number]  CC: Follow-up (3 MNTH F/U, Hot flashes, Trouble staying asleep)   HPI Hayley Miller presents for HTN, LBP, myalgias, all over, severe  Outpatient Medications Prior to Visit  Medication Sig Dispense Refill   acetaminophen (TYLENOL) 325 MG tablet Take 650 mg by mouth every 8 (eight) hours as needed for headache, moderate pain, mild pain or fever.     albuterol (VENTOLIN HFA) 108 (90 Base) MCG/ACT inhaler Inhale 2 puffs into the lungs every 4 (four) hours as needed for wheezing or shortness of breath. 8 g 5   aspirin EC 81 MG tablet Take 81 mg by mouth daily.     cetirizine (ZYRTEC) 10 MG tablet Take 1 tablet (10 mg total) by mouth daily. (Patient taking differently: Take 10 mg by mouth daily as needed.) 30 tablet 0   cyclobenzaprine (FLEXERIL) 5 MG tablet Take 1 tablet (5 mg total) by mouth at bedtime. 30 tablet 2   esomeprazole (NEXIUM) 40 MG capsule Take 1 capsule at 12:00 noon each [place]. 90 capsule 3   fluticasone (FLONASE) 50 MCG/ACT nasal spray USE 2 SPRAYS EACH NOSTRIL ONCE A [place] AS NEEDED FOR ALLERGIES OR CONGESTION. 16 g 2   Lidocaine (LIDODERM EX) Apply 4 % topically daily. 12 hours on and 12 hours off     Menaquinone-7 (K2 PO) Take by mouth.     Multiple Vitamin (MULTIVITAMIN WITH MINERALS) TABS tablet Take 1 tablet by mouth daily.     nitroGLYCERIN (NITROSTAT) 0.4 MG SL tablet Place 1 tablet (0.4 mg total) under the tongue every 5 (five) minutes as needed for chest pain. 20 tablet 2   Tetrahydrozoline HCl (VISINE OP) Place 1 drop into both eyes daily as needed (dry eyes).     carvedilol (COREG) 12.5 MG tablet Take 1 tablet (12.5 mg total) by mouth 2 (two) times daily. 180 tablet 3   hydrALAZINE (APRESOLINE) 25 MG tablet TAKE 1 TABLET (25MG  TOTAL) 3 TIMES DAILY AS NEEDED. TAKE IF BLOOD PRESSURE IS GREATER THAN 150/100. 270 tablet 3   traMADol (ULTRAM-ER) 100 MG 24 hr tablet Take  1 tablet (100 mg total) by mouth at bedtime. 30 tablet 0   traMADol (ULTRAM-ER) 200 MG 24 hr tablet Take 1 tablet (200 mg total) by mouth every morning. 30 tablet 0   No facility-administered medications prior to visit.    ROS: Review of Systems  Constitutional:  Positive for fatigue. Negative for activity change, appetite change, chills and unexpected weight change.  HENT:  Negative for congestion, mouth sores and sinus pressure.   Eyes:  Negative for visual disturbance.  Respiratory:  Negative for cough and chest tightness.   Gastrointestinal:  Negative for abdominal pain and nausea.  Genitourinary:  Negative for difficulty urinating, frequency and vaginal pain.  Musculoskeletal:  Positive for arthralgias, back pain, gait problem, myalgias and neck stiffness.  Skin:  Negative for pallor and rash.  Neurological:  Negative for dizziness, tremors, weakness, numbness and headaches.  Hematological:  Does not bruise/bleed easily.  Psychiatric/Behavioral:  Negative for confusion and sleep disturbance. The patient is not nervous/anxious.     Objective:  BP (!) 180/138 (BP Location: Left Arm, Patient Position: Sitting, Cuff Size: Normal)   Pulse 91   Temp 98.6 F (37 C) (Oral)   Ht 5\' 4"  (1.626 m)   Wt 156 lb (70.8 kg)   LMP 06/07/2012   SpO2 96%  BMI 26.78 kg/m   BP Readings from Last 3 Encounters:  08/12/22 (!) 180/138  03/26/22 130/76  12/22/21 (!) 158/110    Wt Readings from Last 3 Encounters:  08/12/22 156 lb (70.8 kg)  05/19/22 163 lb (73.9 kg)  03/26/22 163 lb (73.9 kg)    Physical Exam Constitutional:      General: She is not in acute distress.    Appearance: She is well-developed. She is obese.  HENT:     Head: Normocephalic.     Right Ear: External ear normal.     Left Ear: External ear normal.     Nose: Nose normal.  Eyes:     General:        Right eye: No discharge.        Left eye: No discharge.     Conjunctiva/sclera: Conjunctivae normal.     Pupils:  Pupils are equal, round, and reactive to light.  Neck:     Thyroid: No thyromegaly.     Vascular: No JVD.     Trachea: No tracheal deviation.  Cardiovascular:     Rate and Rhythm: Normal rate and regular rhythm.     Heart sounds: Normal heart sounds.  Pulmonary:     Effort: No respiratory distress.     Breath sounds: No stridor. No wheezing.  Abdominal:     General: Bowel sounds are normal. There is no distension.     Palpations: Abdomen is soft. There is no mass.     Tenderness: There is no abdominal tenderness. There is no guarding or rebound.  Musculoskeletal:        General: No tenderness.     Cervical back: Normal range of motion and neck supple. No rigidity.  Lymphadenopathy:     Cervical: No cervical adenopathy.  Skin:    Findings: No erythema or rash.  Neurological:     Mental Status: She is oriented to person, place, and time.     Cranial Nerves: No cranial nerve deficit.     Motor: No abnormal muscle tone.     Coordination: Coordination abnormal.     Gait: Gait abnormal.     Deep Tendon Reflexes: Reflexes normal.  Psychiatric:        Behavior: Behavior normal.        Thought Content: Thought content normal.        Judgment: Judgment normal.    Painful large muscles No weakness No rash Scull - NT   Lab Results  Component Value Date   WBC 10.8 (H) 10/29/2020   HGB 14.0 10/29/2020   HCT 42.4 10/29/2020   PLT 409.0 (H) 10/29/2020   GLUCOSE 102 (H) 03/26/2022   CHOL 255 (H) 03/26/2022   TRIG 232.0 (H) 03/26/2022   HDL 46.60 03/26/2022   LDLDIRECT 148.0 03/26/2022   LDLCALC 122 (H) 02/11/2021   ALT 19 03/26/2022   AST 17 03/26/2022   NA 141 03/26/2022   K 3.9 03/26/2022   CL 102 03/26/2022   CREATININE 1.51 (H) 03/26/2022   BUN 21 03/26/2022   CO2 27 03/26/2022   TSH 3.090 02/11/2021   INR 1.0 12/14/2018   HGBA1C 5.5 06/18/2021    No results found.  Assessment & Plan:   Problem List Items Addressed This Visit     Severe uncontrolled  hypertension - Primary (Chronic)   Relevant Medications   carvedilol (COREG) 25 MG tablet   hydrALAZINE (APRESOLINE) 25 MG tablet   Other Relevant Orders   Comprehensive metabolic panel  Dyslipidemia   Relevant Orders   Comprehensive metabolic panel   TSH   Fibromyalgia   Relevant Medications   traMADol (ULTRAM-ER) 100 MG 24 hr tablet   traMADol (ULTRAM-ER) 200 MG 24 hr tablet   Other Relevant Orders   Sedimentation rate   CK      Meds ordered this encounter  Medications   carvedilol (COREG) 25 MG tablet    Sig: Take 1 tablet (25 mg total) by mouth 2 (two) times daily.    Dispense:  60 tablet    Refill:  11   hydrALAZINE (APRESOLINE) 25 MG tablet    Sig: TAKE 1 TABLET (25MG  TOTAL) 3 TIMES DAILY AS NEEDED. TAKE IF BLOOD PRESSURE IS GREATER THAN 150/100.    Dispense:  270 tablet    Refill:  3   traMADol (ULTRAM-ER) 100 MG 24 hr tablet    Sig: Take 1 tablet (100 mg total) by mouth at bedtime.    Dispense:  30 tablet    Refill:  2   traMADol (ULTRAM-ER) 200 MG 24 hr tablet    Sig: Take 1 tablet (200 mg total) by mouth every morning.    Dispense:  30 tablet    Refill:  2      Follow-up: No follow-ups on file.  Sonda Primes, MD

## 2022-08-13 ENCOUNTER — Other Ambulatory Visit (INDEPENDENT_AMBULATORY_CARE_PROVIDER_SITE_OTHER): Payer: Medicare Other

## 2022-08-13 DIAGNOSIS — R5383 Other fatigue: Secondary | ICD-10-CM

## 2022-08-13 LAB — VITAMIN D 25 HYDROXY (VIT D DEFICIENCY, FRACTURES): VITD: 44.64 ng/mL (ref 30.00–100.00)

## 2022-08-20 ENCOUNTER — Other Ambulatory Visit: Payer: Self-pay | Admitting: Internal Medicine

## 2022-08-25 DIAGNOSIS — G4733 Obstructive sleep apnea (adult) (pediatric): Secondary | ICD-10-CM | POA: Diagnosis not present

## 2022-09-23 ENCOUNTER — Encounter: Payer: Self-pay | Admitting: Internal Medicine

## 2022-09-23 ENCOUNTER — Ambulatory Visit (INDEPENDENT_AMBULATORY_CARE_PROVIDER_SITE_OTHER): Payer: Medicare Other | Admitting: Internal Medicine

## 2022-09-23 VITALS — BP 150/102 | HR 60 | Temp 98.7°F | Ht 64.0 in | Wt 156.0 lb

## 2022-09-23 DIAGNOSIS — G8929 Other chronic pain: Secondary | ICD-10-CM | POA: Diagnosis not present

## 2022-09-23 DIAGNOSIS — N184 Chronic kidney disease, stage 4 (severe): Secondary | ICD-10-CM | POA: Diagnosis not present

## 2022-09-23 DIAGNOSIS — J309 Allergic rhinitis, unspecified: Secondary | ICD-10-CM

## 2022-09-23 DIAGNOSIS — M797 Fibromyalgia: Secondary | ICD-10-CM

## 2022-09-23 DIAGNOSIS — M545 Low back pain, unspecified: Secondary | ICD-10-CM

## 2022-09-23 DIAGNOSIS — E785 Hyperlipidemia, unspecified: Secondary | ICD-10-CM

## 2022-09-23 LAB — COMPREHENSIVE METABOLIC PANEL
ALT: 13 U/L (ref 0–35)
AST: 18 U/L (ref 0–37)
Albumin: 4.5 g/dL (ref 3.5–5.2)
Alkaline Phosphatase: 78 U/L (ref 39–117)
BUN: 19 mg/dL (ref 6–23)
CO2: 29 mEq/L (ref 19–32)
Calcium: 10.8 mg/dL — ABNORMAL HIGH (ref 8.4–10.5)
Chloride: 104 mEq/L (ref 96–112)
Creatinine, Ser: 1.36 mg/dL — ABNORMAL HIGH (ref 0.40–1.20)
GFR: 42.36 mL/min — ABNORMAL LOW (ref >60.00)
Glucose, Bld: 91 mg/dL (ref 70–99)
Potassium: 4.6 mEq/L (ref 3.5–5.1)
Sodium: 140 mEq/L (ref 135–145)
Total Bilirubin: 0.3 mg/dL (ref 0.2–1.2)
Total Protein: 8 g/dL (ref 6.0–8.3)

## 2022-09-23 MED ORDER — HYDRALAZINE HCL 100 MG PO TABS: 100.0000 mg | ORAL_TABLET | Freq: Two times a day (BID) | ORAL | 5 refills | Status: DC

## 2022-09-23 MED ORDER — TRAMADOL HCL ER 200 MG PO TB24: 200.0000 mg | ORAL_TABLET | Freq: Every morning | ORAL | 2 refills | Status: DC

## 2022-09-23 MED ORDER — CYCLOBENZAPRINE HCL 5 MG PO TABS: 5.0000 mg | ORAL_TABLET | Freq: Every day | ORAL | 2 refills | Status: DC

## 2022-09-23 MED ORDER — NITROGLYCERIN 0.4 MG SL SUBL: 0.4000 mg | SUBLINGUAL_TABLET | SUBLINGUAL | 2 refills | Status: AC | PRN

## 2022-09-23 MED ORDER — METHYLPREDNISOLONE ACETATE 80 MG/ML IJ SUSP
80.0000 mg | Freq: Once | INTRAMUSCULAR | Status: AC
Start: 2022-09-23 — End: 2022-09-23
  Administered 2022-09-23: 80 mg via INTRAMUSCULAR

## 2022-09-23 MED ORDER — TRAMADOL HCL ER 100 MG PO TB24: 100.0000 mg | ORAL_TABLET | Freq: Every day | ORAL | 2 refills | Status: DC

## 2022-09-23 NOTE — Progress Notes (Signed)
Subjective:  Patient ID: Hayley Miller, female    DOB: July 24, 1960  Age: 62 y.o. MRN: [id number]  CC: Follow-up (6 WEEK F/U)   HPI Hayley Miller presents for HTN, chronic pain, CRI  Outpatient Medications Prior to Visit  Medication Sig Dispense Refill   acetaminophen (TYLENOL) 325 MG tablet Take 650 mg by mouth every 8 (eight) hours as needed for headache, moderate pain, mild pain or fever.     albuterol (VENTOLIN HFA) 108 (90 Base) MCG/ACT inhaler Inhale 2 puffs into the lungs every 4 (four) hours as needed for wheezing or shortness of breath. 8 g 5   aspirin EC 81 MG tablet Take 81 mg by mouth daily.     carvedilol (COREG) 25 MG tablet Take 1 tablet (25 mg total) by mouth 2 (two) times daily. 60 tablet 11   cetirizine (ZYRTEC) 10 MG tablet Take 1 tablet (10 mg total) by mouth daily. (Patient taking differently: Take 10 mg by mouth daily as needed.) 30 tablet 0   esomeprazole (NEXIUM) 40 MG capsule Take 1 capsule at 12:00 noon each [place]. 90 capsule 3   fluticasone (FLONASE) 50 MCG/ACT nasal spray USE 2 SPRAYS EACH NOSTRIL ONCE A [place] AS NEEDED FOR ALLERGIES OR CONGESTION. 16 g 2   Lidocaine (LIDODERM EX) Apply 4 % topically daily. 12 hours on and 12 hours off     Menaquinone-7 (K2 PO) Take by mouth.     Multiple Vitamin (MULTIVITAMIN WITH MINERALS) TABS tablet Take 1 tablet by mouth daily.     Tetrahydrozoline HCl (VISINE OP) Place 1 drop into both eyes daily as needed (dry eyes).     cyclobenzaprine (FLEXERIL) 5 MG tablet Take 1 tablet (5 mg total) by mouth at bedtime. 30 tablet 2   hydrALAZINE (APRESOLINE) 25 MG tablet TAKE 1 TABLET (25MG  TOTAL) 3 TIMES DAILY AS NEEDED. TAKE IF BLOOD PRESSURE IS GREATER THAN 150/100. 270 tablet 3   nitroGLYCERIN (NITROSTAT) 0.4 MG SL tablet Place 1 tablet (0.4 mg total) under the tongue every 5 (five) minutes as needed for chest pain. 20 tablet 2   traMADol (ULTRAM-ER) 100 MG 24 hr tablet Take 1 tablet (100 mg total) by mouth at bedtime. 30 tablet  2   traMADol (ULTRAM-ER) 200 MG 24 hr tablet Take 1 tablet (200 mg total) by mouth every morning. 30 tablet 2   No facility-administered medications prior to visit.    ROS: Review of Systems  Constitutional:  Positive for fatigue. Negative for activity change, appetite change, chills and unexpected weight change.  HENT:  Negative for congestion, mouth sores and sinus pressure.   Eyes:  Negative for visual disturbance.  Respiratory:  Negative for cough, chest tightness and shortness of breath.   Cardiovascular:  Negative for chest pain and leg swelling.  Gastrointestinal:  Negative for abdominal pain and nausea.  Genitourinary:  Negative for difficulty urinating, frequency and vaginal pain.  Musculoskeletal:  Positive for arthralgias and back pain. Negative for gait problem.  Skin:  Negative for pallor and rash.  Neurological:  Negative for dizziness, tremors, weakness, numbness and headaches.  Hematological:  Does not bruise/bleed easily.  Psychiatric/Behavioral:  Positive for dysphoric mood. Negative for confusion and sleep disturbance. The patient is not nervous/anxious.     Objective:  BP (!) 150/102 (BP Location: Left Arm, Patient Position: Sitting, Cuff Size: Normal)   Pulse 60   Temp 98.7 F (37.1 C) (Oral)   Ht 5\' 4"  (1.626 m)   Wt 156 lb (70.8  kg)   LMP 06/07/2012   SpO2 95%   BMI 26.78 kg/m   BP Readings from Last 3 Encounters:  09/23/22 (!) 150/102  08/12/22 (!) 180/138  03/26/22 130/76    Wt Readings from Last 3 Encounters:  09/23/22 156 lb (70.8 kg)  08/12/22 156 lb (70.8 kg)  05/19/22 163 lb (73.9 kg)    Physical Exam Constitutional:      General: She is not in acute distress.    Appearance: She is well-developed. She is obese.  HENT:     Head: Normocephalic.     Right Ear: External ear normal.     Left Ear: External ear normal.     Nose: Nose normal.  Eyes:     General:        Right eye: No discharge.        Left eye: No discharge.      Conjunctiva/sclera: Conjunctivae normal.     Pupils: Pupils are equal, round, and reactive to light.  Neck:     Thyroid: No thyromegaly.     Vascular: No JVD.     Trachea: No tracheal deviation.  Cardiovascular:     Rate and Rhythm: Normal rate and regular rhythm.     Heart sounds: Normal heart sounds.  Pulmonary:     Effort: No respiratory distress.     Breath sounds: No stridor. No wheezing.  Abdominal:     General: Bowel sounds are normal. There is no distension.     Palpations: Abdomen is soft. There is no mass.     Tenderness: There is no abdominal tenderness. There is no guarding or rebound.  Musculoskeletal:        General: Tenderness present.     Cervical back: Normal range of motion and neck supple. No rigidity.  Lymphadenopathy:     Cervical: No cervical adenopathy.  Skin:    Findings: No erythema or rash.  Neurological:     Cranial Nerves: No cranial nerve deficit.     Motor: No abnormal muscle tone.     Coordination: Coordination normal.     Deep Tendon Reflexes: Reflexes normal.  Psychiatric:        Behavior: Behavior normal.        Thought Content: Thought content normal.        Judgment: Judgment normal.   LS w/pain  Lab Results  Component Value Date   WBC 10.8 (H) 10/29/2020   HGB 14.0 10/29/2020   HCT 42.4 10/29/2020   PLT 409.0 (H) 10/29/2020   GLUCOSE 98 08/12/2022   CHOL 255 (H) 03/26/2022   TRIG 232.0 (H) 03/26/2022   HDL 46.60 03/26/2022   LDLDIRECT 148.0 03/26/2022   LDLCALC 122 (H) 02/11/2021   ALT 18 08/12/2022   AST 21 08/12/2022   NA 139 08/12/2022   K 4.7 08/12/2022   CL 101 08/12/2022   CREATININE 1.53 (H) 08/12/2022   BUN 22 08/12/2022   CO2 30 08/12/2022   TSH 1.99 08/12/2022   INR 1.0 12/14/2018   HGBA1C 5.5 06/18/2021    No results found.  Assessment & Plan:   Problem List Items Addressed This Visit     Dyslipidemia    On Crestor      LOW BACK PAIN - Primary     Cont on Tramadol ER 200 mg qhs (max 300 mg/24 h)   Potential benefits of a long term opioids use as well as potential risks (i.e. addiction risk, apnea etc) and complications (i.e. Somnolence, constipation and  others) were explained to the patient and were aknowledged.      Relevant Medications   traMADol (ULTRAM-ER) 100 MG 24 hr tablet   traMADol (ULTRAM-ER) 200 MG 24 hr tablet   cyclobenzaprine (FLEXERIL) 5 MG tablet   Fibromyalgia     Cont on Tramadol ER 200 mg qhs (max 300 mg/24 h)  Potential benefits of a long term opioids use as well as potential risks (i.e. addiction risk, apnea etc) and complications (i.e. Somnolence, constipation and others) were explained to the patient and were aknowledged.      Relevant Medications   traMADol (ULTRAM-ER) 100 MG 24 hr tablet   traMADol (ULTRAM-ER) 200 MG 24 hr tablet   cyclobenzaprine (FLEXERIL) 5 MG tablet   CKD (chronic kidney disease) stage 4, GFR 15-29 ml/min (HCC)    We will monitor GFR. Hydrate well      Relevant Orders   Comprehensive metabolic panel   Allergic rhinitis    Worse Depo-Medrol IM 80 mg         Meds ordered this encounter  Medications   nitroGLYCERIN (NITROSTAT) 0.4 MG SL tablet    Sig: Place 1 tablet (0.4 mg total) under the tongue every 5 (five) minutes as needed for chest pain.    Dispense:  20 tablet    Refill:  2   traMADol (ULTRAM-ER) 100 MG 24 hr tablet    Sig: Take 1 tablet (100 mg total) by mouth at bedtime.    Dispense:  30 tablet    Refill:  2   traMADol (ULTRAM-ER) 200 MG 24 hr tablet    Sig: Take 1 tablet (200 mg total) by mouth every morning.    Dispense:  30 tablet    Refill:  2   hydrALAZINE (APRESOLINE) 100 MG tablet    Sig: Take 1 tablet (100 mg total) by mouth 2 (two) times daily.    Dispense:  60 tablet    Refill:  5   cyclobenzaprine (FLEXERIL) 5 MG tablet    Sig: Take 1 tablet (5 mg total) by mouth at bedtime.    Dispense:  30 tablet    Refill:  2      Follow-up: Return in about 2 months (around 11/23/2022) for a follow-up  visit.  Sonda Primes, MD

## 2022-09-23 NOTE — Addendum Note (Signed)
Addended by: Delsa Grana R on: 09/23/2022 03:26 PM   Modules accepted: Orders

## 2022-09-23 NOTE — Assessment & Plan Note (Signed)
Worse Depo-Medrol IM 80 mg

## 2022-09-23 NOTE — Assessment & Plan Note (Signed)
On Crestor 

## 2022-09-23 NOTE — Assessment & Plan Note (Signed)
We will monitor GFR. Hydrate well 

## 2022-09-23 NOTE — Assessment & Plan Note (Signed)
Cont on Tramadol ER 200 mg qhs (max 300 mg/24 h)  Potential benefits of a long term opioids use as well as potential risks (i.e. addiction risk, apnea etc) and complications (i.e. Somnolence, constipation and others) were explained to the patient and were aknowledged. 

## 2022-09-25 DIAGNOSIS — G4733 Obstructive sleep apnea (adult) (pediatric): Secondary | ICD-10-CM | POA: Diagnosis not present

## 2022-10-11 DIAGNOSIS — H5203 Hypermetropia, bilateral: Secondary | ICD-10-CM | POA: Diagnosis not present

## 2022-10-25 DIAGNOSIS — G4733 Obstructive sleep apnea (adult) (pediatric): Secondary | ICD-10-CM | POA: Diagnosis not present

## 2022-10-27 ENCOUNTER — Encounter (INDEPENDENT_AMBULATORY_CARE_PROVIDER_SITE_OTHER): Payer: Self-pay

## 2022-11-23 DIAGNOSIS — G4733 Obstructive sleep apnea (adult) (pediatric): Secondary | ICD-10-CM | POA: Diagnosis not present

## 2022-11-25 ENCOUNTER — Encounter: Payer: Self-pay | Admitting: Internal Medicine

## 2022-11-25 ENCOUNTER — Ambulatory Visit (INDEPENDENT_AMBULATORY_CARE_PROVIDER_SITE_OTHER): Payer: Medicare Other | Admitting: Internal Medicine

## 2022-11-25 VITALS — BP 170/120 | HR 97 | Temp 97.9°F | Ht 64.0 in | Wt 156.0 lb

## 2022-11-25 DIAGNOSIS — I2510 Atherosclerotic heart disease of native coronary artery without angina pectoris: Secondary | ICD-10-CM

## 2022-11-25 DIAGNOSIS — J309 Allergic rhinitis, unspecified: Secondary | ICD-10-CM | POA: Diagnosis not present

## 2022-11-25 DIAGNOSIS — M545 Low back pain, unspecified: Secondary | ICD-10-CM | POA: Diagnosis not present

## 2022-11-25 DIAGNOSIS — N184 Chronic kidney disease, stage 4 (severe): Secondary | ICD-10-CM

## 2022-11-25 DIAGNOSIS — I10 Essential (primary) hypertension: Secondary | ICD-10-CM

## 2022-11-25 DIAGNOSIS — G8929 Other chronic pain: Secondary | ICD-10-CM

## 2022-11-25 DIAGNOSIS — E785 Hyperlipidemia, unspecified: Secondary | ICD-10-CM

## 2022-11-25 MED ORDER — AZELASTINE HCL 0.1 % NA SOLN: 1.0000 | Freq: Two times a day (BID) | NASAL | 5 refills | Status: AC

## 2022-11-25 MED ORDER — TRAMADOL HCL ER 100 MG PO TB24: 100.0000 mg | ORAL_TABLET | Freq: Every day | ORAL | 2 refills | Status: DC

## 2022-11-25 MED ORDER — VEOZAH 45 MG PO TABS: 45.0000 mg | ORAL_TABLET | Freq: Every day | ORAL | 5 refills | Status: DC

## 2022-11-25 MED ORDER — TRAMADOL HCL ER 200 MG PO TB24: 200.0000 mg | ORAL_TABLET | Freq: Every morning | ORAL | 2 refills | Status: DC

## 2022-11-25 MED ORDER — DOXAZOSIN MESYLATE 4 MG PO TABS: 4.0000 mg | ORAL_TABLET | Freq: Every day | ORAL | 5 refills | Status: DC

## 2022-11-25 MED ORDER — MOMETASONE FUROATE 50 MCG/ACT NA SUSP: 2.0000 | Freq: Every day | NASAL | 11 refills | Status: DC

## 2022-11-25 NOTE — Progress Notes (Signed)
Subjective:  Patient ID: Hayley Miller, female    DOB: 1960-11-21  Age: 62 y.o. MRN: [id number]  CC: Follow-up (2 mnth f/u, Discuss elevated BP and hot flashes)   HPI Hayley Edouard Derocher presents for HTN, allergies, fatigue  Outpatient Medications Prior to Visit  Medication Sig Dispense Refill   acetaminophen (TYLENOL) 325 MG tablet Take 650 mg by mouth every 8 (eight) hours as needed for headache, moderate pain, mild pain or fever.     albuterol (VENTOLIN HFA) 108 (90 Base) MCG/ACT inhaler Inhale 2 puffs into the lungs every 4 (four) hours as needed for wheezing or shortness of breath. 8 g 5   aspirin EC 81 MG tablet Take 81 mg by mouth daily.     carvedilol (COREG) 25 MG tablet Take 1 tablet (25 mg total) by mouth 2 (two) times daily. 60 tablet 11   cetirizine (ZYRTEC) 10 MG tablet Take 1 tablet (10 mg total) by mouth daily. (Patient taking differently: Take 10 mg by mouth daily as needed.) 30 tablet 0   cyclobenzaprine (FLEXERIL) 5 MG tablet Take 1 tablet (5 mg total) by mouth at bedtime. 30 tablet 2   esomeprazole (NEXIUM) 40 MG capsule Take 1 capsule at 12:00 noon each [place]. 90 capsule 3   hydrALAZINE (APRESOLINE) 100 MG tablet Take 1 tablet (100 mg total) by mouth 2 (two) times daily. 60 tablet 5   Lidocaine (LIDODERM EX) Apply 4 % topically daily. 12 hours on and 12 hours off     Menaquinone-7 (K2 PO) Take by mouth.     Multiple Vitamin (MULTIVITAMIN WITH MINERALS) TABS tablet Take 1 tablet by mouth daily.     nitroGLYCERIN (NITROSTAT) 0.4 MG SL tablet Place 1 tablet (0.4 mg total) under the tongue every 5 (five) minutes as needed for chest pain. 20 tablet 2   Tetrahydrozoline HCl (VISINE OP) Place 1 drop into both eyes daily as needed (dry eyes).     fluticasone (FLONASE) 50 MCG/ACT nasal spray USE 2 SPRAYS EACH NOSTRIL ONCE A [place] AS NEEDED FOR ALLERGIES OR CONGESTION. 16 g 2   traMADol (ULTRAM-ER) 100 MG 24 hr tablet Take 1 tablet (100 mg total) by mouth at bedtime. 30 tablet 2    traMADol (ULTRAM-ER) 200 MG 24 hr tablet Take 1 tablet (200 mg total) by mouth every morning. 30 tablet 2   No facility-administered medications prior to visit.    ROS: Review of Systems  Constitutional:  Positive for fatigue. Negative for activity change, appetite change, chills and unexpected weight change.  HENT:  Negative for congestion, mouth sores and sinus pressure.   Eyes:  Negative for visual disturbance.  Respiratory:  Negative for cough and chest tightness.   Gastrointestinal:  Negative for abdominal pain and nausea.  Genitourinary:  Negative for difficulty urinating, frequency and vaginal pain.  Musculoskeletal:  Positive for back pain. Negative for gait problem.  Skin:  Negative for pallor and rash.  Neurological:  Negative for dizziness, tremors, weakness, numbness and headaches.  Psychiatric/Behavioral:  Positive for decreased concentration. Negative for confusion, sleep disturbance and suicidal ideas.     Objective:  BP (!) 170/120 (BP Location: Right Arm, Patient Position: Sitting, Cuff Size: Normal)   Pulse 97   Temp 97.9 F (36.6 C) (Oral)   Ht 5\' 4"  (1.626 m)   Wt 156 lb (70.8 kg)   LMP 06/07/2012   SpO2 97%   BMI 26.78 kg/m   BP Readings from Last 3 Encounters:  11/25/22 (!) 170/120  09/23/22 (!) 150/102  08/12/22 (!) 180/138    Wt Readings from Last 3 Encounters:  11/25/22 156 lb (70.8 kg)  09/23/22 156 lb (70.8 kg)  08/12/22 156 lb (70.8 kg)    Physical Exam Constitutional:      General: She is not in acute distress.    Appearance: She is well-developed. She is obese.  HENT:     Head: Normocephalic.     Right Ear: External ear normal.     Left Ear: External ear normal.     Nose: Nose normal.  Eyes:     General:        Right eye: No discharge.        Left eye: No discharge.     Conjunctiva/sclera: Conjunctivae normal.     Pupils: Pupils are equal, round, and reactive to light.  Neck:     Thyroid: No thyromegaly.     Vascular: No  JVD.     Trachea: No tracheal deviation.  Cardiovascular:     Rate and Rhythm: Normal rate and regular rhythm.     Heart sounds: Normal heart sounds.  Pulmonary:     Effort: No respiratory distress.     Breath sounds: No stridor. No wheezing.  Abdominal:     General: Bowel sounds are normal. There is no distension.     Palpations: Abdomen is soft. There is no mass.     Tenderness: There is no abdominal tenderness. There is no guarding or rebound.  Musculoskeletal:        General: Tenderness present.     Cervical back: Normal range of motion and neck supple. No rigidity.  Lymphadenopathy:     Cervical: No cervical adenopathy.  Skin:    Findings: No erythema or rash.  Neurological:     Cranial Nerves: No cranial nerve deficit.     Motor: No abnormal muscle tone.     Coordination: Coordination normal.     Deep Tendon Reflexes: Reflexes normal.  Psychiatric:        Behavior: Behavior normal.        Thought Content: Thought content normal.        Judgment: Judgment normal.     Lab Results  Component Value Date   WBC 10.8 (H) 10/29/2020   HGB 14.0 10/29/2020   HCT 42.4 10/29/2020   PLT 409.0 (H) 10/29/2020   GLUCOSE 91 09/23/2022   CHOL 255 (H) 03/26/2022   TRIG 232.0 (H) 03/26/2022   HDL 46.60 03/26/2022   LDLDIRECT 148.0 03/26/2022   LDLCALC 122 (H) 02/11/2021   ALT 13 09/23/2022   AST 18 09/23/2022   NA 140 09/23/2022   K 4.6 09/23/2022   CL 104 09/23/2022   CREATININE 1.36 (H) 09/23/2022   BUN 19 09/23/2022   CO2 29 09/23/2022   TSH 1.99 08/12/2022   INR 1.0 12/14/2018   HGBA1C 5.5 06/18/2021    No results found.  Assessment & Plan:   Problem List Items Addressed This Visit     Severe uncontrolled hypertension (Chronic)    Limited Rx choices - will try Cardura      Relevant Medications   doxazosin (CARDURA) 4 MG tablet   CAD (coronary artery disease) - Primary (Chronic)    No angina Cont w/current Rx Mom smokes in the house all the time Try  vitamin k2 and mk7      Relevant Medications   doxazosin (CARDURA) 4 MG tablet   Dyslipidemia    On Crestor  LOW BACK PAIN     Cont on Tramadol ER 200 mg qhs (max 300 mg/24 h)  Potential benefits of a long term opioids use as well as potential risks (i.e. addiction risk, apnea etc) and complications (i.e. Somnolence, constipation and others) were explained to the patient and were aknowledged.      Relevant Medications   traMADol (ULTRAM-ER) 100 MG 24 hr tablet   traMADol (ULTRAM-ER) 200 MG 24 hr tablet   CKD (chronic kidney disease) stage 4, GFR 15-29 ml/min (HCC)    We will monitor GFR. Hydrate well      Allergic rhinitis    Will try a combo - Nasonex plus Astelin, Cont Zyrtec Her mom smokes in the house         Meds ordered this encounter  Medications   azelastine (ASTELIN) 0.1 % nasal spray    Sig: Place 1 spray into both nostrils 2 (two) times daily. Use in each nostril as directed    Dispense:  30 mL    Refill:  5   mometasone (NASONEX) 50 MCG/ACT nasal spray    Sig: Place 2 sprays into the nose daily.    Dispense:  17 g    Refill:  11   traMADol (ULTRAM-ER) 100 MG 24 hr tablet    Sig: Take 1 tablet (100 mg total) by mouth at bedtime.    Dispense:  30 tablet    Refill:  2   traMADol (ULTRAM-ER) 200 MG 24 hr tablet    Sig: Take 1 tablet (200 mg total) by mouth every morning.    Dispense:  30 tablet    Refill:  2   doxazosin (CARDURA) 4 MG tablet    Sig: Take 1 tablet (4 mg total) by mouth daily.    Dispense:  30 tablet    Refill:  5   Fezolinetant (VEOZAH) 45 MG TABS    Sig: Take 1 tablet (45 mg total) by mouth daily.    Dispense:  30 tablet    Refill:  5      Follow-up: Return in about 4 weeks (around 12/23/2022) for a follow-up visit.  Sonda Primes, MD

## 2022-11-25 NOTE — Assessment & Plan Note (Signed)
No angina Cont w/current Rx Mom smokes in the house all the time Try vitamin k2 and mk7

## 2022-11-25 NOTE — Assessment & Plan Note (Signed)
On Crestor 

## 2022-11-25 NOTE — Assessment & Plan Note (Signed)
Limited Rx choices - will try Cardura

## 2022-11-25 NOTE — Assessment & Plan Note (Signed)
We will monitor GFR. Hydrate well 

## 2022-11-25 NOTE — Assessment & Plan Note (Signed)
Cont on Tramadol ER 200 mg qhs (max 300 mg/24 h)  Potential benefits of a long term opioids use as well as potential risks (i.e. addiction risk, apnea etc) and complications (i.e. Somnolence, constipation and others) were explained to the patient and were aknowledged. 

## 2022-11-25 NOTE — Assessment & Plan Note (Signed)
Will try a combo - Nasonex plus Astelin, Cont Zyrtec Her mom smokes in the house

## 2022-11-26 ENCOUNTER — Other Ambulatory Visit (HOSPITAL_COMMUNITY): Payer: Self-pay

## 2022-11-26 ENCOUNTER — Encounter: Payer: Self-pay | Admitting: Pharmacy Technician

## 2022-11-26 DIAGNOSIS — G4733 Obstructive sleep apnea (adult) (pediatric): Secondary | ICD-10-CM | POA: Diagnosis not present

## 2022-11-30 ENCOUNTER — Other Ambulatory Visit (HOSPITAL_COMMUNITY): Payer: Self-pay

## 2022-11-30 NOTE — Telephone Encounter (Signed)
error 

## 2022-12-02 ENCOUNTER — Encounter: Payer: Self-pay | Admitting: Internal Medicine

## 2022-12-10 ENCOUNTER — Other Ambulatory Visit (HOSPITAL_COMMUNITY): Payer: Self-pay

## 2022-12-10 ENCOUNTER — Telehealth: Payer: Self-pay

## 2022-12-10 NOTE — Telephone Encounter (Signed)
Pharmacy Patient Advocate Encounter   Received notification from CoverMyMeds that prior authorization for Veozah 45mg  is required/requested.   Insurance verification completed.   The patient is insured through  [employer]  .   Per test claim: PA required; PA started via CoverMyMeds. KEY [id number] . Waiting for clinical questions to populate.

## 2022-12-10 NOTE — Telephone Encounter (Signed)
Clinical questions answered and PA submitted

## 2022-12-16 NOTE — Telephone Encounter (Signed)
Pharmacy Patient Advocate Encounter  Received notification from [facility] Medicare  that Prior Authorization for Veozah 45mg  has been DENIED.  See denial reason below. No denial letter attached in CMM. Will attache denial letter to Media tab once received.   PA #/Case ID/Reference #: [id number]

## 2022-12-23 ENCOUNTER — Ambulatory Visit (INDEPENDENT_AMBULATORY_CARE_PROVIDER_SITE_OTHER): Payer: Medicare Other | Admitting: Internal Medicine

## 2022-12-23 ENCOUNTER — Inpatient Hospital Stay (HOSPITAL_COMMUNITY): Payer: Medicare Other

## 2022-12-23 ENCOUNTER — Observation Stay (HOSPITAL_COMMUNITY)
Admission: EM | Admit: 2022-12-23 | Discharge: 2022-12-25 | DRG: 312 | Disposition: A | Discharge status: Home / Self Care | Payer: Medicare Other | Attending: Family Medicine | Admitting: Family Medicine

## 2022-12-23 ENCOUNTER — Emergency Department (HOSPITAL_COMMUNITY): Payer: Medicare Other

## 2022-12-23 ENCOUNTER — Other Ambulatory Visit: Payer: Self-pay

## 2022-12-23 ENCOUNTER — Encounter (HOSPITAL_COMMUNITY): Payer: Self-pay

## 2022-12-23 VITALS — BP 124/98 | HR 110 | Temp 97.8°F | Ht 64.0 in | Wt 156.0 lb

## 2022-12-23 DIAGNOSIS — E876 Hypokalemia: Secondary | ICD-10-CM

## 2022-12-23 DIAGNOSIS — G894 Chronic pain syndrome: Secondary | ICD-10-CM | POA: Diagnosis present

## 2022-12-23 DIAGNOSIS — N200 Calculus of kidney: Secondary | ICD-10-CM | POA: Diagnosis not present

## 2022-12-23 DIAGNOSIS — N184 Chronic kidney disease, stage 4 (severe): Secondary | ICD-10-CM | POA: Diagnosis present

## 2022-12-23 DIAGNOSIS — Z8709 Personal history of other diseases of the respiratory system: Secondary | ICD-10-CM | POA: Diagnosis not present

## 2022-12-23 DIAGNOSIS — M797 Fibromyalgia: Secondary | ICD-10-CM | POA: Diagnosis not present

## 2022-12-23 DIAGNOSIS — E785 Hyperlipidemia, unspecified: Secondary | ICD-10-CM | POA: Diagnosis present

## 2022-12-23 DIAGNOSIS — Z833 Family history of diabetes mellitus: Secondary | ICD-10-CM

## 2022-12-23 DIAGNOSIS — K219 Gastro-esophageal reflux disease without esophagitis: Secondary | ICD-10-CM | POA: Diagnosis present

## 2022-12-23 DIAGNOSIS — R509 Fever, unspecified: Secondary | ICD-10-CM | POA: Diagnosis not present

## 2022-12-23 DIAGNOSIS — N179 Acute kidney failure, unspecified: Secondary | ICD-10-CM | POA: Diagnosis not present

## 2022-12-23 DIAGNOSIS — Z8249 Family history of ischemic heart disease and other diseases of the circulatory system: Secondary | ICD-10-CM

## 2022-12-23 DIAGNOSIS — Z886 Allergy status to analgesic agent status: Secondary | ICD-10-CM

## 2022-12-23 DIAGNOSIS — I129 Hypertensive chronic kidney disease with stage 1 through stage 4 chronic kidney disease, or unspecified chronic kidney disease: Secondary | ICD-10-CM | POA: Diagnosis not present

## 2022-12-23 DIAGNOSIS — Z87891 Personal history of nicotine dependence: Secondary | ICD-10-CM

## 2022-12-23 DIAGNOSIS — R55 Syncope and collapse: Secondary | ICD-10-CM | POA: Diagnosis not present

## 2022-12-23 DIAGNOSIS — Z7982 Long term (current) use of aspirin: Secondary | ICD-10-CM

## 2022-12-23 DIAGNOSIS — N951 Menopausal and female climacteric states: Secondary | ICD-10-CM | POA: Insufficient documentation

## 2022-12-23 DIAGNOSIS — R7303 Prediabetes: Secondary | ICD-10-CM | POA: Diagnosis not present

## 2022-12-23 DIAGNOSIS — I3139 Other pericardial effusion (noninflammatory): Secondary | ICD-10-CM | POA: Diagnosis present

## 2022-12-23 DIAGNOSIS — R000 Tachycardia, unspecified: Secondary | ICD-10-CM

## 2022-12-23 DIAGNOSIS — I252 Old myocardial infarction: Secondary | ICD-10-CM | POA: Diagnosis not present

## 2022-12-23 DIAGNOSIS — J329 Chronic sinusitis, unspecified: Secondary | ICD-10-CM | POA: Diagnosis present

## 2022-12-23 DIAGNOSIS — Z885 Allergy status to narcotic agent status: Secondary | ICD-10-CM

## 2022-12-23 DIAGNOSIS — Z83719 Family history of colon polyps, unspecified: Secondary | ICD-10-CM

## 2022-12-23 DIAGNOSIS — I6782 Cerebral ischemia: Secondary | ICD-10-CM | POA: Diagnosis not present

## 2022-12-23 DIAGNOSIS — R110 Nausea: Secondary | ICD-10-CM | POA: Diagnosis not present

## 2022-12-23 DIAGNOSIS — Z800 Family history of malignant neoplasm of digestive organs: Secondary | ICD-10-CM

## 2022-12-23 DIAGNOSIS — Z8679 Personal history of other diseases of the circulatory system: Secondary | ICD-10-CM

## 2022-12-23 DIAGNOSIS — I517 Cardiomegaly: Secondary | ICD-10-CM | POA: Diagnosis not present

## 2022-12-23 DIAGNOSIS — Z955 Presence of coronary angioplasty implant and graft: Secondary | ICD-10-CM

## 2022-12-23 DIAGNOSIS — R0602 Shortness of breath: Secondary | ICD-10-CM | POA: Diagnosis not present

## 2022-12-23 DIAGNOSIS — I2510 Atherosclerotic heart disease of native coronary artery without angina pectoris: Secondary | ICD-10-CM | POA: Diagnosis not present

## 2022-12-23 DIAGNOSIS — F419 Anxiety disorder, unspecified: Secondary | ICD-10-CM | POA: Diagnosis not present

## 2022-12-23 DIAGNOSIS — I214 Non-ST elevation (NSTEMI) myocardial infarction: Secondary | ICD-10-CM | POA: Diagnosis not present

## 2022-12-23 DIAGNOSIS — E872 Acidosis, unspecified: Secondary | ICD-10-CM | POA: Diagnosis not present

## 2022-12-23 DIAGNOSIS — J449 Chronic obstructive pulmonary disease, unspecified: Secondary | ICD-10-CM | POA: Diagnosis present

## 2022-12-23 DIAGNOSIS — I952 Hypotension due to drugs: Secondary | ICD-10-CM | POA: Diagnosis not present

## 2022-12-23 DIAGNOSIS — Z888 Allergy status to other drugs, medicaments and biological substances status: Secondary | ICD-10-CM | POA: Diagnosis not present

## 2022-12-23 DIAGNOSIS — R32 Unspecified urinary incontinence: Secondary | ICD-10-CM | POA: Diagnosis present

## 2022-12-23 DIAGNOSIS — G4733 Obstructive sleep apnea (adult) (pediatric): Secondary | ICD-10-CM | POA: Diagnosis not present

## 2022-12-23 DIAGNOSIS — T446X5A Adverse effect of alpha-adrenoreceptor antagonists, initial encounter: Secondary | ICD-10-CM | POA: Diagnosis not present

## 2022-12-23 DIAGNOSIS — Z79899 Other long term (current) drug therapy: Secondary | ICD-10-CM

## 2022-12-23 DIAGNOSIS — K5730 Diverticulosis of large intestine without perforation or abscess without bleeding: Secondary | ICD-10-CM | POA: Diagnosis not present

## 2022-12-23 DIAGNOSIS — R42 Dizziness and giddiness: Secondary | ICD-10-CM | POA: Diagnosis not present

## 2022-12-23 DIAGNOSIS — R7989 Other specified abnormal findings of blood chemistry: Secondary | ICD-10-CM | POA: Diagnosis present

## 2022-12-23 LAB — URINALYSIS, ROUTINE W REFLEX MICROSCOPIC
Bilirubin Urine: NEGATIVE
Glucose, UA: NEGATIVE mg/dL
Hgb urine dipstick: NEGATIVE
Ketones, ur: NEGATIVE mg/dL
Nitrite: NEGATIVE
Protein, ur: NEGATIVE mg/dL
Specific Gravity, Urine: >1.046 — ABNORMAL HIGH (ref 1.005–1.030)
pH: 6 (ref 5.0–8.0)

## 2022-12-23 LAB — I-STAT CHEM 8, ED
BUN: 26 mg/dL — ABNORMAL HIGH (ref 6–20)
Calcium, Ion: 1.2 mmol/L (ref 1.15–1.40)
Chloride: 108 mmol/L (ref 98–111)
Creatinine, Ser: 2 mg/dL — ABNORMAL HIGH (ref 0.44–1.00)
Glucose, Bld: 162 mg/dL — ABNORMAL HIGH (ref 70–99)
HCT: 47 % — ABNORMAL HIGH (ref 36.0–46.0)
Hemoglobin: 16 g/dL — ABNORMAL HIGH (ref 12.0–15.0)
Potassium: 3.7 mmol/L (ref 3.5–5.1)
Sodium: 141 mmol/L (ref 135–145)
TCO2: 22 mmol/L (ref 22–32)

## 2022-12-23 LAB — BASIC METABOLIC PANEL
Anion gap: 11 (ref 5–15)
BUN: 22 mg/dL — ABNORMAL HIGH (ref 6–20)
CO2: 24 mmol/L (ref 22–32)
Calcium: 9.9 mg/dL (ref 8.9–10.3)
Chloride: 104 mmol/L (ref 98–111)
Creatinine, Ser: 1.79 mg/dL — ABNORMAL HIGH (ref 0.44–1.00)
GFR, Estimated: 32 mL/min — ABNORMAL LOW (ref >60)
Glucose, Bld: 161 mg/dL — ABNORMAL HIGH (ref 70–99)
Potassium: 3.4 mmol/L — ABNORMAL LOW (ref 3.5–5.1)
Sodium: 139 mmol/L (ref 135–145)

## 2022-12-23 LAB — I-STAT CG4 LACTIC ACID, ED
Lactic Acid, Venous: 3.3 mmol/L (ref 0.5–1.9)
Lactic Acid, Venous: 2.9 mmol/L (ref 0.5–1.9)

## 2022-12-23 LAB — DIC (DISSEMINATED INTRAVASCULAR COAGULATION)PANEL
D-Dimer, Quant: <0.27 ug/mL-FEU (ref 0.00–0.50)
Fibrinogen: 330 mg/dL (ref 210–475)
INR: 1.1 (ref 0.8–1.2)
Platelets: 325 10*3/uL (ref 150–400)
Prothrombin Time: 14 seconds (ref 11.4–15.2)
Smear Review: NONE SEEN
aPTT: 26 seconds (ref 24–36)

## 2022-12-23 LAB — PREPARE PLATELET PHERESIS: Unit division: 0

## 2022-12-23 LAB — CBC
HCT: 43.2 % (ref 36.0–46.0)
Hemoglobin: 13.6 g/dL (ref 12.0–15.0)
MCH: 30.9 pg (ref 26.0–34.0)
MCHC: 31.5 g/dL (ref 30.0–36.0)
MCV: 98.2 fL (ref 80.0–100.0)
Platelets: 7 10*3/uL — CL (ref 150–400)
RBC: 4.4 MIL/uL (ref 3.87–5.11)
RDW: 12.8 % (ref 11.5–15.5)
WBC: 6.9 10*3/uL (ref 4.0–10.5)
nRBC: 0 % (ref 0.0–0.2)

## 2022-12-23 LAB — TROPONIN I (HIGH SENSITIVITY)
Troponin I (High Sensitivity): 43 ng/L — ABNORMAL HIGH (ref <18)
Troponin I (High Sensitivity): 42 ng/L — ABNORMAL HIGH (ref <18)

## 2022-12-23 LAB — TYPE AND SCREEN
ABO/RH(D): A POS
Antibody Screen: NEGATIVE

## 2022-12-23 LAB — HEPATIC FUNCTION PANEL
ALT: 16 U/L (ref 0–44)
AST: 22 U/L (ref 15–41)
Albumin: 4.1 g/dL (ref 3.5–5.0)
Alkaline Phosphatase: 79 U/L (ref 38–126)
Bilirubin, Direct: 0.1 mg/dL (ref 0.0–0.2)
Indirect Bilirubin: 0.4 mg/dL (ref 0.3–0.9)
Total Bilirubin: 0.5 mg/dL (ref 0.3–1.2)
Total Protein: 8 g/dL (ref 6.5–8.1)

## 2022-12-23 LAB — BLOOD GAS, VENOUS
Acid-base deficit: 0.6 mmol/L (ref 0.0–2.0)
Bicarbonate: 24.9 mmol/L (ref 20.0–28.0)
O2 Saturation: 68.2 %
Patient temperature: 37
pCO2, Ven: 43 mmHg — ABNORMAL LOW (ref 44–60)
pH, Ven: 7.37 (ref 7.25–7.43)
pO2, Ven: 36 mmHg (ref 32–45)

## 2022-12-23 LAB — BPAM PLATELET PHERESIS
Blood Product Expiration Date: 202409302359
Unit Type and Rh: 6200

## 2022-12-23 LAB — CBG MONITORING, ED: Glucose-Capillary: 153 mg/dL — ABNORMAL HIGH (ref 70–99)

## 2022-12-23 LAB — LACTATE DEHYDROGENASE: LDH: 132 U/L (ref 98–192)

## 2022-12-23 LAB — PLATELET COUNT: Platelets: 347 10*3/uL (ref 150–400)

## 2022-12-23 LAB — LIPASE, BLOOD: Lipase: 24 U/L (ref 11–51)

## 2022-12-23 MED ORDER — SODIUM CHLORIDE 0.9% FLUSH: 3.0000 mL | INTRAVENOUS | Status: DC | PRN

## 2022-12-23 MED ORDER — DOXAZOSIN MESYLATE 4 MG PO TABS: 4.0000 mg | ORAL_TABLET | Freq: Every day | ORAL | Status: DC

## 2022-12-23 MED ORDER — ASPIRIN 81 MG PO TBEC
81.0000 mg | DELAYED_RELEASE_TABLET | Freq: Every day | ORAL | Status: DC
  Administered 2022-12-24 – 2022-12-25 (×2): 81 mg via ORAL
  Filled 2022-12-23 (×2): qty 1

## 2022-12-23 MED ORDER — POTASSIUM CHLORIDE 20 MEQ PO PACK
40.0000 meq | PACK | Freq: Once | ORAL | Status: AC
  Administered 2022-12-23: 40 meq via ORAL
  Filled 2022-12-23: qty 2

## 2022-12-23 MED ORDER — ACETAMINOPHEN 650 MG RE SUPP: 650.0000 mg | Freq: Four times a day (QID) | RECTAL | Status: DC | PRN

## 2022-12-23 MED ORDER — BUTALBITAL-APAP-CAFFEINE 50-325-40 MG PO TABS
1.0000 | ORAL_TABLET | Freq: Four times a day (QID) | ORAL | Status: AC | PRN
  Administered 2022-12-23 – 2022-12-24 (×2): 1 via ORAL
  Filled 2022-12-23 (×2): qty 1

## 2022-12-23 MED ORDER — SODIUM CHLORIDE 0.9 % IV SOLN: 250.0000 mL | INTRAVENOUS | Status: DC | PRN

## 2022-12-23 MED ORDER — DEXAMETHASONE SODIUM PHOSPHATE 10 MG/ML IJ SOLN
40.0000 mg | Freq: Once | INTRAMUSCULAR | Status: AC
  Administered 2022-12-23: 40 mg via INTRAVENOUS
  Filled 2022-12-23 (×2): qty 4

## 2022-12-23 MED ORDER — ONDANSETRON HCL 4 MG/2ML IJ SOLN: 4.0000 mg | Freq: Four times a day (QID) | INTRAMUSCULAR | Status: DC | PRN

## 2022-12-23 MED ORDER — LACTATED RINGERS IV BOLUS
1000.0000 mL | Freq: Once | INTRAVENOUS | Status: AC
  Administered 2022-12-23: 1000 mL via INTRAVENOUS

## 2022-12-23 MED ORDER — ACETAMINOPHEN 325 MG PO TABS
650.0000 mg | ORAL_TABLET | Freq: Four times a day (QID) | ORAL | Status: DC | PRN
  Administered 2022-12-24: 650 mg via ORAL
  Filled 2022-12-23: qty 2

## 2022-12-23 MED ORDER — SODIUM CHLORIDE 0.9% FLUSH
3.0000 mL | Freq: Two times a day (BID) | INTRAVENOUS | Status: DC
  Administered 2022-12-23 – 2022-12-24 (×2): 3 mL via INTRAVENOUS

## 2022-12-23 MED ORDER — LACTATED RINGERS IV SOLN: INTRAVENOUS | Status: DC

## 2022-12-23 MED ORDER — SODIUM CHLORIDE 0.9% IV SOLUTION: Freq: Once | INTRAVENOUS | Status: AC

## 2022-12-23 MED ORDER — POLYETHYLENE GLYCOL 3350 17 G PO PACK: 17.0000 g | PACK | Freq: Every day | ORAL | Status: DC | PRN

## 2022-12-23 MED ORDER — IOHEXOL 350 MG/ML SOLN
100.0000 mL | Freq: Once | INTRAVENOUS | Status: AC | PRN
  Administered 2022-12-23: 100 mL via INTRAVENOUS

## 2022-12-23 MED ORDER — ALBUTEROL SULFATE HFA 108 (90 BASE) MCG/ACT IN AERS: 2.0000 | INHALATION_SPRAY | RESPIRATORY_TRACT | Status: DC | PRN

## 2022-12-23 MED ORDER — ALBUTEROL SULFATE (2.5 MG/3ML) 0.083% IN NEBU: 2.5000 mg | INHALATION_SOLUTION | RESPIRATORY_TRACT | Status: DC | PRN

## 2022-12-23 MED ORDER — ONDANSETRON HCL 4 MG PO TABS: 4.0000 mg | ORAL_TABLET | Freq: Four times a day (QID) | ORAL | Status: DC | PRN

## 2022-12-23 MED ORDER — CARVEDILOL 25 MG PO TABS
25.0000 mg | ORAL_TABLET | Freq: Two times a day (BID) | ORAL | Status: DC
  Administered 2022-12-23 – 2022-12-25 (×4): 25 mg via ORAL
  Filled 2022-12-23 (×2): qty 1
  Filled 2022-12-23: qty 2
  Filled 2022-12-23 (×2): qty 1

## 2022-12-23 MED ORDER — SODIUM CHLORIDE (PF) 0.9 % IJ SOLN
INTRAMUSCULAR | Status: AC
  Filled 2022-12-23: qty 50

## 2022-12-23 NOTE — ED Notes (Signed)
Lab was notified about the DC of platelet order.

## 2022-12-23 NOTE — ED Provider Notes (Signed)
[place] EMERGENCY DEPARTMENT AT [facility] Provider Note   CSN: [id number] Arrival date & time: 12/23/22  1545     History  Chief Complaint  Patient presents with   Nausea   Dizziness    Hayley Miller is a 62 y.o. female.  Patient is a 62 year old female with past medical history of CAD status post stent placement, CKD, hypertension, COPD presenting to the emergency department with nausea, vomiting and near syncope.  Reports that her symptoms just started this morning and then while in the waiting room here she started to develop severe diffuse abdominal pain.  She reports shortness of breath but denies any chest pain.  Patient's further history was limited due to her critical acuity.  The history is provided by the patient and medical records. The history is limited by the condition of the patient.  Dizziness      Home Medications Prior to Admission medications   Medication Sig Start Date End Date Taking? Authorizing Provider  acetaminophen (TYLENOL) 500 MG tablet Take 500 mg by mouth every 6 (six) hours as needed.   Yes [provider]  albuterol (VENTOLIN HFA) 108 (90 Base) MCG/ACT inhaler Inhale 2 puffs into the lungs every 4 (four) hours as needed for wheezing or shortness of breath. 03/26/22  Yes Plotnikov, Georgina Quint, MD  aspirin EC 81 MG tablet Take 81 mg by mouth daily.   Yes [provider]  azelastine (ASTELIN) 0.1 % nasal spray Place 1 spray into both nostrils 2 (two) times daily. Use in each nostril as directed 11/25/22  Yes Plotnikov, Georgina Quint, MD  carvedilol (COREG) 25 MG tablet Take 1 tablet (25 mg total) by mouth 2 (two) times daily. 08/12/22 08/12/23 Yes Plotnikov, Georgina Quint, MD  cetirizine (ZYRTEC) 10 MG tablet Take 1 tablet (10 mg total) by mouth daily. Patient taking differently: Take 10 mg by mouth daily as needed. 12/08/18  Yes Olive Bass, FNP  cholecalciferol (VITAMIN D3) 25 MCG (1000 UNIT) tablet Take 1,000  Units by mouth daily.   Yes [provider]  cyclobenzaprine (FLEXERIL) 5 MG tablet Take 1 tablet (5 mg total) by mouth at bedtime. 09/23/22  Yes Plotnikov, Georgina Quint, MD  doxazosin (CARDURA) 4 MG tablet Take 1 tablet (4 mg total) by mouth daily. 11/25/22  Yes Plotnikov, Georgina Quint, MD  esomeprazole (NEXIUM) 40 MG capsule Take 1 capsule at 12:00 noon each [place]. Patient taking differently: Take 40 mg by mouth daily. 03/12/22  Yes Plotnikov, Georgina Quint, MD  hydrALAZINE (APRESOLINE) 100 MG tablet Take 1 tablet (100 mg total) by mouth 2 (two) times daily. 09/23/22  Yes Plotnikov, Georgina Quint, MD  mometasone (NASONEX) 50 MCG/ACT nasal spray Place 2 sprays into the nose daily. Patient taking differently: Place 2 sprays into the nose daily as needed. 11/25/22  Yes Plotnikov, Georgina Quint, MD  Multiple Vitamin (MULTIVITAMIN WITH MINERALS) TABS tablet Take 1 tablet by mouth daily.   Yes [provider]  nitroGLYCERIN (NITROSTAT) 0.4 MG SL tablet Place 1 tablet (0.4 mg total) under the tongue every 5 (five) minutes as needed for chest pain. 09/23/22  Yes Plotnikov, Georgina Quint, MD  Tetrahydrozoline HCl (VISINE OP) Place 1 drop into both eyes daily as needed (dry eyes).   Yes [provider]  traMADol (ULTRAM-ER) 100 MG 24 hr tablet Take 1 tablet (100 mg total) by mouth at bedtime. 11/25/22  Yes Plotnikov, Georgina Quint, MD  Fezolinetant (VEOZAH) 45 MG TABS Take 1 tablet (45 mg  total) by mouth daily. 11/25/22   Plotnikov, Georgina Quint, MD  traMADol (ULTRAM-ER) 200 MG 24 hr tablet Take 1 tablet (200 mg total) by mouth every morning. Patient not taking: Reported on 12/23/2022 11/25/22   Plotnikov, Georgina Quint, MD      Allergies    Amlodipine, Clonidine hydrochloride, Codeine, Gabapentin, Ibuprofen, Lovastatin, and Spironolactone    Review of Systems   Review of Systems  Neurological:  Positive for dizziness.    Physical Exam Updated Vital Signs BP (!) 128/91 (BP Location: Left Arm)   Pulse (!) 111    Temp 97.6 F (36.4 C) (Oral)   Resp (!) 22   LMP 06/07/2012   SpO2 94%  Physical Exam Vitals and nursing note reviewed.  Constitutional:      General: She is in acute distress.     Appearance: She is ill-appearing and diaphoretic.     Comments: Pale, diaphoretic  HENT:     Head: Normocephalic and atraumatic.     Nose: Nose normal.     Mouth/Throat:     Mouth: Mucous membranes are moist.     Pharynx: Oropharynx is clear.  Eyes:     Extraocular Movements: Extraocular movements intact.     Conjunctiva/sclera: Conjunctivae normal.  Cardiovascular:     Rate and Rhythm: Regular rhythm. Tachycardia present.     Heart sounds: Normal heart sounds.  Pulmonary:     Effort: Pulmonary effort is normal.     Breath sounds: Normal breath sounds.  Abdominal:     General: Abdomen is flat.     Palpations: Abdomen is soft.     Tenderness: There is abdominal tenderness (Diffuse). There is no guarding or rebound.  Musculoskeletal:        General: Normal range of motion.     Cervical back: Normal range of motion.  Skin:    General: Skin is warm.  Neurological:     General: No focal deficit present.     Mental Status: She is oriented to person, place, and time.  Psychiatric:        Mood and Affect: Mood normal.        Behavior: Behavior normal.     ED Results / Procedures / Treatments   Labs (all labs ordered are listed, but only abnormal results are displayed) Labs Reviewed  BASIC METABOLIC PANEL - Abnormal; Notable for the following components:      Result Value   Potassium 3.4 (*)    Glucose, Bld 161 (*)    BUN 22 (*)    Creatinine, Ser 1.79 (*)    GFR, Estimated 32 (*)    All other components within normal limits  CBC - Abnormal; Notable for the following components:   Platelets 7 (*)    All other components within normal limits  URINALYSIS, ROUTINE W REFLEX MICROSCOPIC - Abnormal; Notable for the following components:   Specific Gravity, Urine >1.046 (*)    Leukocytes,Ua  SMALL (*)    Bacteria, UA RARE (*)    All other components within normal limits  BLOOD GAS, VENOUS - Abnormal; Notable for the following components:   pCO2, Ven 43 (*)    All other components within normal limits  CBG MONITORING, ED - Abnormal; Notable for the following components:   Glucose-Capillary 153 (*)    All other components within normal limits  I-STAT CHEM 8, ED - Abnormal; Notable for the following components:   BUN 26 (*)    Creatinine, Ser 2.00 (*)  Glucose, Bld 162 (*)    Hemoglobin 16.0 (*)    HCT 47.0 (*)    All other components within normal limits  I-STAT CG4 LACTIC ACID, ED - Abnormal; Notable for the following components:   Lactic Acid, Venous 3.3 (*)    All other components within normal limits  I-STAT CG4 LACTIC ACID, ED - Abnormal; Notable for the following components:   Lactic Acid, Venous 2.9 (*)    All other components within normal limits  TROPONIN I (HIGH SENSITIVITY) - Abnormal; Notable for the following components:   Troponin I (High Sensitivity) 43 (*)    All other components within normal limits  TROPONIN I (HIGH SENSITIVITY) - Abnormal; Notable for the following components:   Troponin I (High Sensitivity) 42 (*)    All other components within normal limits  LIPASE, BLOOD  HEPATIC FUNCTION PANEL  DIC (DISSEMINATED INTRAVASCULAR COAGULATION)PANEL  LACTATE DEHYDROGENASE  PLATELET COUNT  HAPTOGLOBIN  TYPE AND SCREEN  PREPARE PLATELET PHERESIS  ABO/RH    EKG EKG Interpretation Date/Time:  Thursday December 23 2022 19:59:32 EDT Ventricular Rate:  108 PR Interval:  167 QRS Duration:  99 QT Interval:  323 QTC Calculation: 433 R Axis:   -67  Text Interpretation: Sinus tachycardia Left anterior fascicular block Abnormal R-wave progression, early transition Nonspecific T abnrm, anterolateral leads Inferior t-wave inversions now appear upright compared to prior EKG today Confirmed by Elayne Snare ([id number]) on 12/23/2022 8:08:29  PM  Radiology DG Chest Port 1 View  Result Date: 12/23/2022 CLINICAL DATA:  Shortness of breath EXAM: PORTABLE CHEST 1 VIEW COMPARISON:  12/14/2018 FINDINGS: Borderline cardiomegaly. No acute airspace disease or effusion. No pneumothorax. IMPRESSION: No active disease. Electronically Signed   By: Jasmine Pang M.D.   On: 12/23/2022 20:08   CT Angio Chest/Abd/Pel for Dissection W and/or Wo Contrast  Result Date: 12/23/2022 CLINICAL DATA:  Nausea and dizziness for a few hours. EXAM: CT ANGIOGRAPHY CHEST, ABDOMEN AND PELVIS TECHNIQUE: Non-contrast CT of the chest was initially obtained. Multidetector CT imaging through the chest, abdomen and pelvis was performed using the standard protocol during bolus administration of intravenous contrast. Multiplanar reconstructed images and MIPs were obtained and reviewed to evaluate the vascular anatomy. RADIATION DOSE REDUCTION: This exam was performed according to the departmental dose-optimization program which includes automated exposure control, adjustment of the mA and/or kV according to patient size and/or use of iterative reconstruction technique. CONTRAST:  OMNIPAQUE IOHEXOL 350 MG/ML SOLN COMPARISON:  Radiograph 12/14/2018 and CTA chest abdomen and pelvis 07/18/2017 FINDINGS: CTA CHEST FINDINGS Cardiovascular: No intramural hematoma, aneurysm, or dissection in the thoracic aorta. No pericardial effusion. No central pulmonary embolism Mediastinum/Nodes: Small hiatal hernia. Trachea is unremarkable. No thoracic adenopathy. Lungs/Pleura: Bilateral lower lobe scarring and atelectasis with mild bronchiolectasis likely postinfectious. No pleural effusion or pneumothorax Musculoskeletal: No acute fracture. Review of the MIP images confirms the above findings. CTA ABDOMEN AND PELVIS FINDINGS VASCULAR Mild scattered calcified atherosclerotic plaque without hemodynamically significant stenosis. No aortic aneurysm or dissection. The mesenteric and renal arteries are  widely patent. Widely patent inflow and outflow arteries bilaterally. Review of the MIP images confirms the above findings. NON-VASCULAR Hepatobiliary: No focal liver abnormality is seen. No gallstones, gallbladder wall thickening, or biliary dilatation. Pancreas: Unremarkable. Spleen: Unremarkable. Adrenals/Urinary Tract: Stable adrenal glands. Bilateral nonobstructing nephrolithiasis. No hydronephrosis. Unremarkable bladder. Stomach/Bowel: Normal caliber large and small bowel. No bowel wall thickening. Colonic diverticulosis without diverticulitis. Normal appendix. Stomach is within normal limits. Lymphatic: No lymphadenopathy. Reproductive: Hysterectomy. Other: No  free intraperitoneal fluid or air. Musculoskeletal: No acute fracture. Review of the MIP images confirms the above findings. IMPRESSION: 1. No acute aortic syndrome. 2. No acute findings in the chest, abdomen, or pelvis. 3. Bilateral nonobstructing nephrolithiasis. 4. Colonic diverticulosis without diverticulitis. Electronically Signed   By: Minerva Fester M.D.   On: 12/23/2022 20:01    Procedures .Critical Care  Performed by: Rexford Maus, DO Authorized by: Rexford Maus, DO   Critical care provider statement:    Critical care time (minutes):  40   Critical care time was exclusive of:  Separately billable procedures and treating other patients   Critical care was necessary to treat or prevent imminent or life-threatening deterioration of the following conditions:  Circulatory failure   Critical care was time spent personally by me on the following activities:  Development of treatment plan with patient or surrogate, discussions with consultants, evaluation of patient's response to treatment, examination of patient, obtaining history from patient or surrogate, ordering and performing treatments and interventions, ordering and review of laboratory studies, ordering and review of radiographic studies, pulse oximetry,  re-evaluation of patient's condition and review of old charts   I assumed direction of critical care for this patient from another provider in my specialty: no   Ultrasound ED FAST  Date/Time: 12/23/2022 5:13 PM  Performed by: Rexford Maus, DO Authorized by: Rexford Maus, DO  Procedure details:     Indications comment:  Abdominal pain, hypotension    Assess for:  Intra-abdominal fluid    Technique:  Abdominal    Images: not archived      Abdominal findings:    L kidney:  Visualized   R kidney:  Visualized   Liver:  Visualized    Bladder:  Visualized   Hepatorenal space visualized: identified     Splenorenal space: identified     Rectovesical free fluid: not identified     Splenorenal free fluid: not identified     Hepatorenal space free fluid: not identified   Ultrasound ED Echo  Date/Time: 12/23/2022 5:14 PM  Performed by: Rexford Maus, DO Authorized by: Rexford Maus, DO   Procedure details:    Indications: hypotension     Views: apical 4 chamber view     Images: not archived     Limitations:  Body habitus and positioning Findings:    Pericardium: small pericardial effusion     LV Function: normal (>50% EF)     RV Diameter: normal   Impression:    Impression: pericardial effusion present       Medications Ordered in ED Medications  lactated ringers bolus 1,000 mL (0 mLs Intravenous Stopped 12/23/22 2000)  iohexol (OMNIPAQUE) 350 MG/ML injection 100 mL (100 mLs Intravenous Contrast Given 12/23/22 1711)  dexamethasone (DECADRON) injection 40 mg (40 mg Intravenous Given 12/23/22 1954)  0.9 %  sodium chloride infusion (Manually program via Guardrails IV Fluids) ( Intravenous Canceled Entry 12/23/22 2036)  lactated ringers bolus 1,000 mL (0 mLs Intravenous Stopped 12/23/22 2147)    ED Course/ Medical Decision Making/ A&P Clinical Course as of 12/23/22 2214  Thu Dec 23, 2022  1756 Platelet count of 7, previously normal platelets. No active  bleeding seen on exam or by my interpretation on CT though radiology read is pending at this time. Will consult hematology. 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 Start on dexamethasone 40 mg daily and monitoring for bleeding. Order DIC  panel, haptoglobin and LDH. [VK]  1943 Platelet count normal on DIC panel. Will resent platelet level before starting transfusion. [VK]  1943 Repeat troponin flat, CT read pending. [VK]  2007 No acute abnormality on CT dissection study. [VK]  2015 Persistently elevated lactic acid, patient given 2nd liter of IVF. [VK]  2035 Repeat platelet count normal. Platelet transfusion cancelled, already received steroids. [VK]  2112 Due to persistent lactic acidosis and high risk near syncope will be admitted for observation. [VK]    Clinical Course User Index [VK] Rexford Maus, DO                                 Medical Decision Making This patient presents to the ED with chief complaint(s) of N/V, dizziness with pertinent past medical history of HTN, CAD, CKD which further complicates the presenting complaint. The complaint involves an extensive differential diagnosis and also carries with it a high risk of complications and morbidity.    The differential diagnosis includes dissection, arrhythmia, anemia, ACS, sepsis, vasovagal syncope, orthostatic syncope, dehydration, electrolyte abnormality gastroenteritis, gastritis, pancreatitis, hepatitis, pneumonia, pneumothorax, pulmonary edema, pleural effusion  Additional history obtained: Additional history obtained from N/A Records reviewed Primary Care Documents  ED Course and Reassessment: On patient's arrival to the emergency department she was initially hypotensive to the 80s, pale and diaphoretic was immediately transferred back to a room and evaluated the patient at bedside.  She was awake and alert, tachycardic, blood pressure improved  once lying supine on the stretcher.  FAST exam was performed that showed no intra-abdominal free fluid but did show pericardial effusion.  The patient's abdominal pain and hypotension concern for possible dissection or aortic catastrophe and will have CT dissection study.  She was started on fluids and will additionally have labs.  EKG was performed that showed inferior septal T wave inversions new compared to prior EKG in our system from 4 years ago.  Traditionally troponin sent.  She will be closely reassessed.  Independent labs interpretation:  The following labs were independently interpreted: lactic acidosis, thrombocytopenia corrected on repeat labs  Independent visualization of imaging: - I independently visualized the following imaging with scope of interpretation limited to determining acute life threatening conditions related to emergency care: CXR, CT dissection, which revealed no acute abnormality to explain symptoms  Consultation: - Consulted or discussed management/test interpretation w/ external professional: hospitalist  Consideration for admission or further workup: patient requires admission for high risk syncope Social Determinants of health: N/A    Amount and/or Complexity of Data Reviewed Labs: ordered. Radiology: ordered.  Risk Prescription drug management. Decision regarding hospitalization.          Final Clinical Impression(s) / ED Diagnoses Final diagnoses:  Near syncope  Lactic acidosis    Rx / DC Orders ED Discharge Orders     None         Rexford Maus, DO 12/23/22 2214

## 2022-12-23 NOTE — H&P (Addendum)
History and Physical    Hayley Miller ZOX:[id number] DOB: [birth year] DOA: 12/23/2022  PCP: Tresa Garter, MD   Patient coming from: Home   Chief Complaint:  Chief Complaint  Patient presents with   Nausea   Dizziness    HPI:  Hayley Miller is a 62 y.o. female with medical history significant of essential hypertension, CAD, COPD, CKD stage IV and GERD presented to emergency department for evaluation for nausea and near syncopal episode at home.  Patient reported that she went to see her internal medicine clinic today however she complained about nausea, headache, diaphoresis and elevated heart rate.  Patient was referred to ER for evaluation. During my evaluation patient reported that she has been started on Cardura 4 mg daily 1 month ago and since then patient has been taking Cardura she feels very dizzy and has low energy.  She also feels racing of heart when she stands up with associated nausea. also is giving her headache. Patient denies any loss of consciousness, fall, hitting of her head, chest pain, vomiting, abdominal pain, constipation, diarrhea and lower extremities swelling.   ED Course:  At presentation to ER patient heart rate 109, respiratory rate is 18, blood pressure 122/78 and O2 sat 95% room air. POC blood glucose 153.  BMP showed sodium 139, low potassium 3.4, chloride 104, bicarb 24, blood glucose 161, BUN 22, creatinine 1.79 (renal function at baseline), calcium 9.9, GFR 32 and anion gap 11. UA unremarkable. DIC panel showed platelet count 325.  Initial CBC unremarkable except showed low platelets 7.  Hematologist was consulted in the ER recommended DIC panel which showed platelet count within normal range 347.  Per hematology and lab for CBC panel showed hemolyzed-which would falsely low platelet count 7. Elevated lactic acid 3.3 which improved to 2.9 after to 2 liter of LR in the ER. Troponin 43 and 42 without any delta change.   EKG showed sinus  tachycardia 108, left anterior fascicular block, prolonged R wave progression.  Due to elevated heart rate CTA chest obtained which ruled out PE and no acute aortic syndrome.  Bilateral nonobstructing nephrolithiasis.  Colonic diverticulosis without diverticulitis.  CT abdomen pelvis No acute finding of the chest, abdomen and pelvis.  Chest x-ray unremarkable.  ER physician Dr. Theresia Lo reported bedside echocardiogram showed pericardial effusion however CTA chest did not showed anything.  Hospitalist has been contacted for further evaluation and management of syncope, tachycardia and elevated troponin.   Review of Systems:  Review of Systems  Constitutional:  Positive for malaise/fatigue. Negative for chills, fever and weight loss.  Respiratory:  Negative for cough, hemoptysis and shortness of breath.   Cardiovascular:  Positive for palpitations. Negative for chest pain, orthopnea, claudication and leg swelling.  Gastrointestinal:  Positive for nausea. Negative for abdominal pain, constipation, diarrhea, heartburn and vomiting.  Musculoskeletal:  Negative for myalgias and neck pain.  Neurological:  Positive for dizziness. Negative for tingling, tremors, sensory change, speech change, focal weakness, seizures, loss of consciousness, weakness and headaches.  Psychiatric/Behavioral:  The patient is not nervous/anxious.     Past Medical History:  Diagnosis Date   Allergy    Anemia    Anxiety    CAD (coronary artery disease)    a. s/p AL MI in 2006 tx with PTCA to LAD;  b. LHC (10/2009): lum irregs in LAD, o/w no CAD, EF 55-60%;  c. Myoview (07/2011):  no ischemia, EF 63%;  d. Echo (11/2011):  mild LVH, EF 60-65%, Gr  1 DD;  e.  Lexiscan Myoview (04/2013):  Inferior bowel atten artifact with inferoapical defect; no ischemia; EF 58% (low risk)   Cameron lesion, acute    Chronic lower back pain    Chronic pain syndrome    Daily headache    Depression    Erosive esophagitis    Fibromyalgia     GERD (gastroesophageal reflux disease)    Hemorrhage    upper gastrointestional. GI bleed 10/09 due to NSAID   Hiatal hernia    HLD (hyperlipidemia)    HTN (hypertension)    Myocardial infarction (HCC) 2006 X 2   Rhinitis    Seasonal allergies     Past Surgical History:  Procedure Laterality Date   BLADDER SUSPENSION N/A 05/08/2013   Procedure: TRANSVAGINAL TAPE (TVT) PROCEDURE;  Surgeon: Levi Aland, MD;  Location: [facility];  Service: Gynecology;  Laterality: N/A;   BREAST CYST EXCISION Left 2002   CARDIAC CATHETERIZATION  07/19/2017   COLONOSCOPY     CORONARY ANGIOPLASTY  2006   CYSTOSCOPY N/A 05/08/2013   Procedure: CYSTOSCOPY;  Surgeon: Levi Aland, MD;  Location: [facility];  Service: Gynecology;  Laterality: N/A;   LAPAROSCOPIC ASSISTED VAGINAL HYSTERECTOMY N/A 05/08/2013   Procedure: LAPAROSCOPIC ASSISTED VAGINAL HYSTERECTOMY;  Surgeon: Levi Aland, MD;  Location: [facility];  Service: Gynecology;  Laterality: N/A;   LAPAROSCOPIC BILATERAL SALPINGO OOPHERECTOMY N/A 05/08/2013   Procedure: LAPAROSCOPIC BILATERAL SALPINGO OOPHORECTOMY;  Surgeon: Levi Aland, MD;  Location: [facility];  Service: Gynecology;  Laterality: N/A;   LEFT HEART CATH AND CORONARY ANGIOGRAPHY N/A 07/19/2017   Procedure: LEFT HEART CATH AND CORONARY ANGIOGRAPHY;  Surgeon: Swazil[place], Peter M, MD;  Location: [facility] IN[facility] CV [facility];  Service: Cardiovascular;  Laterality: N/A;   UPPER GASTROINTESTINAL ENDOSCOPY       reports that she quit smoking about 5 years ago. Her smoking use included cigarettes. She started smoking about 8 years ago. She has a 3 pack-year smoking history. She has never used smokeless tobacco. She reports that she does not currently use alcohol. She reports that she does not use drugs.  Allergies  Allergen Reactions   Amlodipine     gingival hypertrophy   Clonidine Hydrochloride Other (See Comments)    REACTION: FATIGUE   Codeine Nausea And Vomiting    Unknown reaction (patient reports  taking cough syrup with codeine)   Gabapentin     Too sleepy   Ibuprofen Other (See Comments)    GI bleed   Lovastatin Other (See Comments)    Joint pain   Spironolactone     fatigue    Family History  Problem Relation Age of Onset   Hypertension Mother    Heart murmur Mother    Colon polyps Mother    Colon cancer Maternal Aunt    Heart disease Son    Heart disease Maternal Grandmother    Diabetes Maternal Aunt    Esophageal cancer Neg Hx    Rectal cancer Neg Hx    Stomach cancer Neg Hx    Breast cancer Neg Hx     Prior to Admission medications   Medication Sig Start Date End Date Taking? Authorizing Provider  acetaminophen (TYLENOL) 500 MG tablet Take 500 mg by mouth every 6 (six) hours as needed.   Yes [provider]  albuterol (VENTOLIN HFA) 108 (90 Base) MCG/ACT inhaler Inhale 2 puffs into the lungs every 4 (four) hours as needed for wheezing or shortness of breath. 03/26/22  Yes Plotnikov,  Georgina Quint, MD  aspirin EC 81 MG tablet Take 81 mg by mouth daily.   Yes [provider]  azelastine (ASTELIN) 0.1 % nasal spray Place 1 spray into both nostrils 2 (two) times daily. Use in each nostril as directed 11/25/22  Yes Plotnikov, Georgina Quint, MD  carvedilol (COREG) 25 MG tablet Take 1 tablet (25 mg total) by mouth 2 (two) times daily. 08/12/22 08/12/23 Yes Plotnikov, Georgina Quint, MD  cetirizine (ZYRTEC) 10 MG tablet Take 1 tablet (10 mg total) by mouth daily. Patient taking differently: Take 10 mg by mouth daily as needed. 12/08/18  Yes Olive Bass, FNP  cholecalciferol (VITAMIN D3) 25 MCG (1000 UNIT) tablet Take 1,000 Units by mouth daily.   Yes [provider]  cyclobenzaprine (FLEXERIL) 5 MG tablet Take 1 tablet (5 mg total) by mouth at bedtime. 09/23/22  Yes Plotnikov, Georgina Quint, MD  doxazosin (CARDURA) 4 MG tablet Take 1 tablet (4 mg total) by mouth daily. 11/25/22  Yes Plotnikov, Georgina Quint, MD  esomeprazole (NEXIUM) 40 MG capsule Take 1 capsule  at 12:00 noon each Poster. Patient taking differently: Take 40 mg by mouth daily. 03/12/22  Yes Plotnikov, Georgina Quint, MD  hydrALAZINE (APRESOLINE) 100 MG tablet Take 1 tablet (100 mg total) by mouth 2 (two) times daily. 09/23/22  Yes Plotnikov, Georgina Quint, MD  mometasone (NASONEX) 50 MCG/ACT nasal spray Place 2 sprays into the nose daily. Patient taking differently: Place 2 sprays into the nose daily as needed. 11/25/22  Yes Plotnikov, Georgina Quint, MD  Multiple Vitamin (MULTIVITAMIN WITH MINERALS) TABS tablet Take 1 tablet by mouth daily.   Yes [provider]  nitroGLYCERIN (NITROSTAT) 0.4 MG SL tablet Place 1 tablet (0.4 mg total) under the tongue every 5 (five) minutes as needed for chest pain. 09/23/22  Yes Plotnikov, Georgina Quint, MD  Tetrahydrozoline HCl (VISINE OP) Place 1 drop into both eyes daily as needed (dry eyes).   Yes [provider]  traMADol (ULTRAM-ER) 100 MG 24 hr tablet Take 1 tablet (100 mg total) by mouth at bedtime. 11/25/22  Yes Plotnikov, Georgina Quint, MD  Fezolinetant (VEOZAH) 45 MG TABS Take 1 tablet (45 mg total) by mouth daily. 11/25/22   Plotnikov, Georgina Quint, MD  traMADol (ULTRAM-ER) 200 MG 24 hr tablet Take 1 tablet (200 mg total) by mouth every morning. Patient not taking: Reported on 12/23/2022 11/25/22   Tresa Garter, MD     Physical Exam: Vitals:   12/23/22 2100 12/23/22 2130 12/23/22 2248 12/23/22 2315  BP: 134/87 (!) 144/94  (!) 143/103  Pulse: 89 96  (!) 102  Resp: 18 (!) 25  16  Temp:      TempSrc:      SpO2: 98% 97%  95%  Weight:   70.8 kg   Height:   5\' 4"  (1.626 m)     Physical Exam Constitutional:      General: She is not in acute distress.    Appearance: She is not ill-appearing.  HENT:     Mouth/Throat:     Mouth: Mucous membranes are moist.  Eyes:     Conjunctiva/sclera: Conjunctivae normal.  Cardiovascular:     Rate and Rhythm: Regular rhythm. Tachycardia present.     Pulses: Normal pulses.     Heart sounds: Normal heart  sounds.  Pulmonary:     Effort: Pulmonary effort is normal.     Breath sounds: Normal breath sounds.  Abdominal:     General: Bowel sounds are  normal.  Musculoskeletal:     Cervical back: Neck supple.     Right lower leg: No edema.     Left lower leg: No edema.  Skin:    Findings: No erythema, lesion or rash.  Neurological:     Mental Status: She is alert and oriented to person, place, and time.     Motor: No weakness.  Psychiatric:        Mood and Affect: Mood normal.        Behavior: Behavior normal.        Thought Content: Thought content normal.        Judgment: Judgment normal.      Labs on Admission: I have personally reviewed following labs and imaging studies  CBC: Recent Labs  Lab 12/23/22 1638 12/23/22 1657 12/23/22 1855 12/23/22 1944  WBC  --  6.9  --   --   HGB 16.0* 13.6  --   --   HCT 47.0* 43.2  --   --   MCV  --  98.2  --   --   PLT  --  7* 325 347   Basic Metabolic Panel: Recent Labs  Lab 12/23/22 1638 12/23/22 1657  NA 141 139  K 3.7 3.4*  CL 108 104  CO2  --  24  GLUCOSE 162* 161*  BUN 26* 22*  CREATININE 2.00* 1.79*  CALCIUM  --  9.9   GFR: Estimated Creatinine Clearance: 32.2 mL/min (A) (by C-G formula based on SCr of 1.79 mg/dL (H)). Liver Function Tests: Recent Labs  Lab 12/23/22 1657  AST 22  ALT 16  ALKPHOS 79  BILITOT 0.5  PROT 8.0  ALBUMIN 4.1   Recent Labs  Lab 12/23/22 1657  LIPASE 24   No results for input(s): "AMMONIA" in the last 168 hours. Coagulation Profile: Recent Labs  Lab 12/23/22 1855  INR 1.1   Cardiac Enzymes: Recent Labs  Lab 12/23/22 1657 12/23/22 1855  TROPONINIHS 43* 42*   BNP (last 3 results) No results for input(s): "BNP" in the last 8760 hours. HbA1C: No results for input(s): "HGBA1C" in the last 72 hours. CBG: Recent Labs  Lab 12/23/22 1627  GLUCAP 153*   Lipid Profile: No results for input(s): "CHOL", "HDL", "LDLCALC", "TRIG", "CHOLHDL", "LDLDIRECT" in the last 72  hours. Thyroid Function Tests: No results for input(s): "TSH", "T4TOTAL", "FREET4", "T3FREE", "THYROIDAB" in the last 72 hours. Anemia Panel: No results for input(s): "VITAMINB12", "FOLATE", "FERRITIN", "TIBC", "IRON", "RETICCTPCT" in the last 72 hours. Urine analysis:    Component Value Date/Time   COLORURINE YELLOW 12/23/2022 1800   APPEARANCEUR CLEAR 12/23/2022 1800   LABSPEC >1.046 (H) 12/23/2022 1800   PHURINE 6.0 12/23/2022 1800   GLUCOSEU NEGATIVE 12/23/2022 1800   GLUCOSEU NEGATIVE 04/02/2019 1646   GLUCOSEU NEGATIVE 04/02/2019 1646   HGBUR NEGATIVE 12/23/2022 1800   BILIRUBINUR NEGATIVE 12/23/2022 1800   KETONESUR NEGATIVE 12/23/2022 1800   PROTEINUR NEGATIVE 12/23/2022 1800   UROBILINOGEN 0.2 04/02/2019 1646   UROBILINOGEN 0.2 04/02/2019 1646   NITRITE NEGATIVE 12/23/2022 1800   LEUKOCYTESUR SMALL (A) 12/23/2022 1800    Radiological Exams on Admission: I have personally reviewed images DG Chest Port 1 View  Result Date: 12/23/2022 CLINICAL DATA:  Shortness of breath EXAM: PORTABLE CHEST 1 VIEW COMPARISON:  12/14/2018 FINDINGS: Borderline cardiomegaly. No acute airspace disease or effusion. No pneumothorax. IMPRESSION: No active disease. Electronically Signed   By: Jasmine Pang M.D.   On: 12/23/2022 20:08   CT Angio Chest/Abd/Pel for  Dissection W and/or Wo Contrast  Result Date: 12/23/2022 CLINICAL DATA:  Nausea and dizziness for a few hours. EXAM: CT ANGIOGRAPHY CHEST, ABDOMEN AND PELVIS TECHNIQUE: Non-contrast CT of the chest was initially obtained. Multidetector CT imaging through the chest, abdomen and pelvis was performed using the standard protocol during bolus administration of intravenous contrast. Multiplanar reconstructed images and MIPs were obtained and reviewed to evaluate the vascular anatomy. RADIATION DOSE REDUCTION: This exam was performed according to the departmental dose-optimization program which includes automated exposure control, adjustment of the mA  and/or kV according to patient size and/or use of iterative reconstruction technique. CONTRAST:  OMNIPAQUE IOHEXOL 350 MG/ML SOLN COMPARISON:  Radiograph 12/14/2018 and CTA chest abdomen and pelvis 07/18/2017 FINDINGS: CTA CHEST FINDINGS Cardiovascular: No intramural hematoma, aneurysm, or dissection in the thoracic aorta. No pericardial effusion. No central pulmonary embolism Mediastinum/Nodes: Small hiatal hernia. Trachea is unremarkable. No thoracic adenopathy. Lungs/Pleura: Bilateral lower lobe scarring and atelectasis with mild bronchiolectasis likely postinfectious. No pleural effusion or pneumothorax Musculoskeletal: No acute fracture. Review of the MIP images confirms the above findings. CTA ABDOMEN AND PELVIS FINDINGS VASCULAR Mild scattered calcified atherosclerotic plaque without hemodynamically significant stenosis. No aortic aneurysm or dissection. The mesenteric and renal arteries are widely patent. Widely patent inflow and outflow arteries bilaterally. Review of the MIP images confirms the above findings. NON-VASCULAR Hepatobiliary: No focal liver abnormality is seen. No gallstones, gallbladder wall thickening, or biliary dilatation. Pancreas: Unremarkable. Spleen: Unremarkable. Adrenals/Urinary Tract: Stable adrenal glands. Bilateral nonobstructing nephrolithiasis. No hydronephrosis. Unremarkable bladder. Stomach/Bowel: Normal caliber large and small bowel. No bowel wall thickening. Colonic diverticulosis without diverticulitis. Normal appendix. Stomach is within normal limits. Lymphatic: No lymphadenopathy. Reproductive: Hysterectomy. Other: No free intraperitoneal fluid or air. Musculoskeletal: No acute fracture. Review of the MIP images confirms the above findings. IMPRESSION: 1. No acute aortic syndrome. 2. No acute findings in the chest, abdomen, or pelvis. 3. Bilateral nonobstructing nephrolithiasis. 4. Colonic diverticulosis without diverticulitis. Electronically Signed   By: Minerva Fester M.D.   On: 12/23/2022 20:01    EKG: My personal interpretation of EKG shows: EKG showed sinus tachycardia 108, left anterior fascicular block, prolonged R wave progression.   Assessment/Plan: Principal Problem:   Near syncope Active Problems:   NSTEMI (non-ST elevated myocardial infarction) (HCC)   Sinus tachycardia   Hypokalemia   Lactic acidosis   GERD (gastroesophageal reflux disease)   CKD (chronic kidney disease) stage 4, GFR 15-29 ml/min (HCC)   History of CAD (coronary artery disease)   History of COPD   Hot flashes due to menopause    Assessment and Plan: Near syncope > Patient presenting with evaluation for dizziness/near syncope, with associated nausea, diaphoresis and palpitation.  Patient reported since she has been started on doxazosin 4 mg 1 month ago she has been noticing the same dose which has been progressively getting worse.  -At presentation to ER heart 109 otherwise vitals are unremarkable. - Found to elevated troponin 42 and 43 with unremarkable EKG. patient denies any chest pain.  Rules out ACS. - Pending CT head. - Chest x-ray unremarkable. - As patient complaining about palpitations CT angiogram chest/abdomen/pelvis obtained which rules out no acute aortic syndrome, no acute findings in the chest, abdomen and pelvis. -Concern for near syncope with associated presyncopal episodes include nausea, diaphoresis and palpitation in the setting of starting new medications doxazosin which is alpha-blocker.  Also ruling out other possibilities as well. -Checking echocardiogram. - Checking orthostatic vital. -Continue neurocheck every 4 hours and fall  precaution. - Continue cardiac monitoring. - Consider stop doxazosin on discharge. -Consulted inpatient PT for evaluation of balance.  NSTMI type II secondary to demand ischemia in setting of tachycardia -Found to elevated troponin 42 and 43 with unremarkable EKG. patient denies any chest pain.  Rules out  ACS. -Elevated troponin in the setting of elevated heart rate secondary to demand ischemia. - Continue to trend troponin. -ER physician Dr. Theresia Lo reported that at bedside she obtained echocardiogram which showed pericardial effusion however CT chest did not show any evidence of effusion. - Obtaining echocardiogram to look for pericardial effusion.  If echocardiogram shows significant cardial effusion in that case need too consult cardiology for further evaluation. - Continue to monitor and development of chest pain. - Continue cardiac monitoring.   Lactic acidosis - At presentation to ER initial lactic acid was 3.3 which has been trended down to 2.9 after 2 L of LR in the ER.  Patient does not have any sign of infection.  No leukocytosis.  Patient is afebrile.  Chest x-ray completely unremarkable.  CT abdomen pelvis chest no evidence of infection.  Lactic acidosis likely secondary from low blood pressure at home rather than any infectious etiology. - Checking lactic acid level in the a.m. - Continue LR 75 cc/h for 1 Sattar.  Sinus tachycardia Essential hypertension -In the ER patient heart rate in 109 to 111.  Patient has history of essential hypertension - Resumed home Coreg 25 mg twice daily.  History of CAD -Continue aspirin 81 mg daily, Coreg 25 mg twice twice daily.  Hypokalemia -Slightly low potassium 3.4. - Replating with oral potassium.  Checking mag level.  CKD stage IV -Creatinine 1.79 and GFR 32 which is around patient baseline. -Abdomen pelvis showed bilateral nonobstructing nephrolithiasis. -Continue monitor renal function - Avoid nephrotoxic agent.   History of COPD -Stable - Continue home albuterol 2 puffs every 4 hour as needed for wheezing shortness of breath.   Heart flashes due to postmenopausal - Holding doxazosin in the setting of near syncope.   DVT prophylaxis: SCD.  Deferring pharmacological prophylaxis until CT head ruled out any acute  abnormality. Code Status:  Full Code Diet: Heart healthy diet. Family Communication: No family member at bedside Disposition Plan: Pending echocardiogram to assess for pericardial effusion.  Tentative discharge to home next 2 to 3 days. Consults: PT Admission status:   Inpatient, Telemetry bed  Severity of Illness: The appropriate patient status for this patient is INPATIENT. Inpatient status is judged to be reasonable and necessary in order to provide the required intensity of service to ensure the patient's safety. The patient's presenting symptoms, physical exam findings, and initial radiographic and laboratory data in the context of their chronic comorbidities is felt to place them at high risk for further clinical deterioration. Furthermore, it is not anticipated that the patient will be medically stable for discharge from the hospital within 2 midnights of admission.   * I certify that at the point of admission it is my clinical judgment that the patient will require inpatient hospital care spanning beyond 2 midnights from the point of admission due to high intensity of service, high risk for further deterioration and high frequency of surveillance required.[name]    Tereasa Coop, MD [facility]  How to contact the [facility] Attending or Consulting provider 7A - 7P or covering provider during after hours 7P -7A, for this patient.  Check the care team in [facility] and look for a) attending/consulting [facility] provider listed and b) the  [facility] team listed Log into [email or web address] and use [place]'s universal password to access. If you do not have the password, please contact the hospital operator. Locate the [facility] provider you are looking for under [facility] and page to a number that you can be directly reached. If you still have difficulty reaching the provider, please page the [facility] (Director on Call) for the Hospitalists listed on amion for assistance.  12/23/2022, 11:42 PM

## 2022-12-23 NOTE — ED Notes (Signed)
Pts Istat CG4 lactic acid results 2.85 Edp made aware

## 2022-12-23 NOTE — Progress Notes (Signed)
Subjective:  Patient ID: Hayley Miller, female    DOB: Mar 19, 1961  Age: 62 y.o. MRN: [id number]  CC: No chief complaint on file.   HPI Ishani Klingele Vierra presents for 3 mo f/u -  C/o weakness, nausea feeling, lightheaded - Just started to have sx's in the waiting room according to the patient and her mother  Outpatient Medications Prior to Visit  Medication Sig Dispense Refill   acetaminophen (TYLENOL) 325 MG tablet Take 650 mg by mouth every 8 (eight) hours as needed for headache, moderate pain, mild pain or fever.     albuterol (VENTOLIN HFA) 108 (90 Base) MCG/ACT inhaler Inhale 2 puffs into the lungs every 4 (four) hours as needed for wheezing or shortness of breath. 8 g 5   aspirin EC 81 MG tablet Take 81 mg by mouth daily.     azelastine (ASTELIN) 0.1 % nasal spray Place 1 spray into both nostrils 2 (two) times daily. Use in each nostril as directed 30 mL 5   carvedilol (COREG) 25 MG tablet Take 1 tablet (25 mg total) by mouth 2 (two) times daily. 60 tablet 11   cetirizine (ZYRTEC) 10 MG tablet Take 1 tablet (10 mg total) by mouth daily. (Patient taking differently: Take 10 mg by mouth daily as needed.) 30 tablet 0   cyclobenzaprine (FLEXERIL) 5 MG tablet Take 1 tablet (5 mg total) by mouth at bedtime. 30 tablet 2   doxazosin (CARDURA) 4 MG tablet Take 1 tablet (4 mg total) by mouth daily. 30 tablet 5   esomeprazole (NEXIUM) 40 MG capsule Take 1 capsule at 12:00 noon each [place]. 90 capsule 3   Fezolinetant (VEOZAH) 45 MG TABS Take 1 tablet (45 mg total) by mouth daily. 30 tablet 5   hydrALAZINE (APRESOLINE) 100 MG tablet Take 1 tablet (100 mg total) by mouth 2 (two) times daily. 60 tablet 5   Lidocaine (LIDODERM EX) Apply 4 % topically daily. 12 hours on and 12 hours off     Menaquinone-7 (K2 PO) Take by mouth.     mometasone (NASONEX) 50 MCG/ACT nasal spray Place 2 sprays into the nose daily. 17 g 11   Multiple Vitamin (MULTIVITAMIN WITH MINERALS) TABS tablet Take 1 tablet by mouth  daily.     nitroGLYCERIN (NITROSTAT) 0.4 MG SL tablet Place 1 tablet (0.4 mg total) under the tongue every 5 (five) minutes as needed for chest pain. 20 tablet 2   Tetrahydrozoline HCl (VISINE OP) Place 1 drop into both eyes daily as needed (dry eyes).     traMADol (ULTRAM-ER) 100 MG 24 hr tablet Take 1 tablet (100 mg total) by mouth at bedtime. 30 tablet 2   traMADol (ULTRAM-ER) 200 MG 24 hr tablet Take 1 tablet (200 mg total) by mouth every morning. 30 tablet 2   No facility-administered medications prior to visit.    ROS: Review of Systems  Constitutional:  Positive for diaphoresis and fatigue. Negative for activity change, appetite change, chills and unexpected weight change.  HENT:  Negative for congestion, mouth sores and sinus pressure.   Eyes:  Negative for visual disturbance.  Respiratory:  Negative for cough, chest tightness, shortness of breath and wheezing.   Cardiovascular:  Negative for chest pain and leg swelling.  Gastrointestinal:  Negative for abdominal pain and nausea.  Genitourinary:  Negative for difficulty urinating, flank pain, frequency, urgency and vaginal pain.  Musculoskeletal:  Positive for back pain. Negative for arthralgias and gait problem.  Skin:  Negative for pallor  and rash.  Neurological:  Positive for weakness and light-headedness. Negative for dizziness, tremors, syncope, numbness and headaches.  Psychiatric/Behavioral:  Negative for confusion and sleep disturbance.     Objective:  BP (!) 124/98 (BP Location: Right Arm, Patient Position: Sitting, Cuff Size: Normal)   Pulse (!) 110   Temp 97.8 F (36.6 C) (Oral)   Ht 5\' 4"  (1.626 m)   Wt 156 lb (70.8 kg)   LMP 06/07/2012   SpO2 94%   BMI 26.78 kg/m   BP Readings from Last 3 Encounters:  12/23/22 (!) 124/98  11/25/22 (!) 170/120  09/23/22 (!) 150/102    Wt Readings from Last 3 Encounters:  12/23/22 156 lb (70.8 kg)  11/25/22 156 lb (70.8 kg)  09/23/22 156 lb (70.8 kg)    Physical  Exam Constitutional:      General: She is in acute distress.     Appearance: She is well-developed. She is ill-appearing. She is not toxic-appearing.  HENT:     Head: Normocephalic.     Right Ear: External ear normal.     Left Ear: External ear normal.     Nose: Nose normal.  Eyes:     General:        Right eye: No discharge.        Left eye: No discharge.     Conjunctiva/sclera: Conjunctivae normal.     Pupils: Pupils are equal, round, and reactive to light.  Neck:     Thyroid: No thyromegaly.     Vascular: No JVD.     Trachea: No tracheal deviation.  Cardiovascular:     Rate and Rhythm: Normal rate and regular rhythm.     Heart sounds: Normal heart sounds.  Pulmonary:     Effort: No respiratory distress.     Breath sounds: No stridor. No wheezing.  Abdominal:     General: Bowel sounds are normal. There is no distension.     Palpations: Abdomen is soft. There is no mass.     Tenderness: There is no abdominal tenderness. There is no guarding or rebound.  Musculoskeletal:        General: No tenderness.     Cervical back: Normal range of motion and neck supple. No rigidity.  Lymphadenopathy:     Cervical: No cervical adenopathy.  Skin:    Findings: No erythema or rash.  Neurological:     Cranial Nerves: No cranial nerve deficit.     Motor: No abnormal muscle tone.     Coordination: Coordination normal.     Deep Tendon Reflexes: Reflexes normal.  Psychiatric:        Behavior: Behavior normal.        Thought Content: Thought content normal.        Judgment: Judgment normal.   Diaphoretc, pale - better in 15 min, laying on the exam table HR 120 at the start, 100 bpm later  Procedure: EKG Indication: near syncope Impression: S tachy w/PVCs. Incomplete RBBB.  No new changes since 2023.   Lab Results  Component Value Date   WBC 10.8 (H) 10/29/2020   HGB 14.0 10/29/2020   HCT 42.4 10/29/2020   PLT 409.0 (H) 10/29/2020   GLUCOSE 91 09/23/2022   CHOL 255 (H)  03/26/2022   TRIG 232.0 (H) 03/26/2022   HDL 46.60 03/26/2022   LDLDIRECT 148.0 03/26/2022   LDLCALC 122 (H) 02/11/2021   ALT 13 09/23/2022   AST 18 09/23/2022   NA 140 09/23/2022   K 4.6 09/23/2022  CL 104 09/23/2022   CREATININE 1.36 (H) 09/23/2022   BUN 19 09/23/2022   CO2 29 09/23/2022   TSH 1.99 08/12/2022   INR 1.0 12/14/2018   HGBA1C 5.5 06/18/2021    No results found.  Assessment & Plan:   Problem List Items Addressed This Visit     Near syncope - Primary    Vaso-vagal vs other Mom will take Pam to ER Pt refused ambulance      Relevant Orders   EKG 12-Lead      No orders of the defined types were placed in this encounter.     Follow-up: No follow-ups on file.  Sonda Primes, MD

## 2022-12-23 NOTE — ED Provider Notes (Signed)
Ok to proceed with CT dissection study despite increased creatinine level due to high concern for dissection.   Hayley Miller K, DO 12/23/22 1701

## 2022-12-23 NOTE — Assessment & Plan Note (Signed)
Vaso-vagal vs other Mom will take Pam to ER Pt refused ambulance

## 2022-12-23 NOTE — Progress Notes (Signed)
I was called for a platelet count of 7K. No hemolysis noted. DIC panel showed a platelet count which is normal. Repeat platelet count was normal. Since she doesnt have any acute hematologic needs, we will sign off

## 2022-12-23 NOTE — ED Triage Notes (Signed)
Pt arrived for nausea and dizziness that started a few hours ago suddenly. No vomiting. States she was diaphoretic. Denies any cp,shob or any other symptom.

## 2022-12-24 ENCOUNTER — Inpatient Hospital Stay (HOSPITAL_COMMUNITY): Payer: Medicare Other

## 2022-12-24 ENCOUNTER — Encounter: Payer: Self-pay | Admitting: Internal Medicine

## 2022-12-24 ENCOUNTER — Other Ambulatory Visit (HOSPITAL_COMMUNITY): Payer: Medicare Other

## 2022-12-24 DIAGNOSIS — I3139 Other pericardial effusion (noninflammatory): Secondary | ICD-10-CM | POA: Diagnosis not present

## 2022-12-24 DIAGNOSIS — R55 Syncope and collapse: Secondary | ICD-10-CM | POA: Diagnosis not present

## 2022-12-24 LAB — COMPREHENSIVE METABOLIC PANEL
ALT: 15 U/L (ref 0–44)
AST: 18 U/L (ref 15–41)
Albumin: 3.5 g/dL (ref 3.5–5.0)
Alkaline Phosphatase: 70 U/L (ref 38–126)
Anion gap: 8 (ref 5–15)
BUN: 18 mg/dL (ref 6–20)
CO2: 23 mmol/L (ref 22–32)
Calcium: 10 mg/dL (ref 8.9–10.3)
Chloride: 105 mmol/L (ref 98–111)
Creatinine, Ser: 1.24 mg/dL — ABNORMAL HIGH (ref 0.44–1.00)
GFR, Estimated: 50 mL/min — ABNORMAL LOW (ref >60)
Glucose, Bld: 130 mg/dL — ABNORMAL HIGH (ref 70–99)
Potassium: 4.4 mmol/L (ref 3.5–5.1)
Sodium: 136 mmol/L (ref 135–145)
Total Bilirubin: 0.7 mg/dL (ref 0.3–1.2)
Total Protein: 7 g/dL (ref 6.5–8.1)

## 2022-12-24 LAB — CBC
HCT: 40.1 % (ref 36.0–46.0)
Hemoglobin: 12.9 g/dL (ref 12.0–15.0)
MCH: 30.9 pg (ref 26.0–34.0)
MCHC: 32.2 g/dL (ref 30.0–36.0)
MCV: 95.9 fL (ref 80.0–100.0)
Platelets: 324 10*3/uL (ref 150–400)
RBC: 4.18 MIL/uL (ref 3.87–5.11)
RDW: 13 % (ref 11.5–15.5)
WBC: 8.7 10*3/uL (ref 4.0–10.5)
nRBC: 0 % (ref 0.0–0.2)

## 2022-12-24 LAB — ABO/RH: ABO/RH(D): A POS

## 2022-12-24 LAB — ECHOCARDIOGRAM COMPLETE
AR max vel: 2.22 cm2
AV Area VTI: 1.94 cm2
AV Area mean vel: 2.14 cm2
AV Mean grad: 4 mm[Hg]
AV Peak grad: 6.7 mm[Hg]
Ao pk vel: 1.29 m/s
Area-P 1/2: 6.07 cm2
Height: 64 in
S' Lateral: 2.4 cm
Weight: 2496 [oz_av]

## 2022-12-24 LAB — HIV ANTIBODY (ROUTINE TESTING W REFLEX): HIV Screen 4th Generation wRfx: NONREACTIVE

## 2022-12-24 LAB — TROPONIN I (HIGH SENSITIVITY)
Troponin I (High Sensitivity): 44 ng/L — ABNORMAL HIGH (ref <18)
Troponin I (High Sensitivity): 44 ng/L — ABNORMAL HIGH (ref <18)

## 2022-12-24 LAB — LACTIC ACID, PLASMA: Lactic Acid, Venous: 2.3 mmol/L (ref 0.5–1.9)

## 2022-12-24 LAB — MAGNESIUM: Magnesium: 1.7 mg/dL (ref 1.7–2.4)

## 2022-12-24 MED ORDER — CYCLOBENZAPRINE HCL 5 MG PO TABS
5.0000 mg | ORAL_TABLET | Freq: Every day | ORAL | Status: DC
  Administered 2022-12-24: 5 mg via ORAL
  Filled 2022-12-24: qty 1

## 2022-12-24 MED ORDER — HEPARIN SODIUM (PORCINE) 5000 UNIT/ML IJ SOLN
5000.0000 [IU] | Freq: Three times a day (TID) | INTRAMUSCULAR | Status: DC
  Administered 2022-12-24 – 2022-12-25 (×4): 5000 [IU] via SUBCUTANEOUS
  Filled 2022-12-24 (×4): qty 1

## 2022-12-24 MED ORDER — TRAMADOL HCL 50 MG PO TABS: 100.0000 mg | ORAL_TABLET | Freq: Every day | ORAL | Status: DC

## 2022-12-24 MED ORDER — PANTOPRAZOLE SODIUM 40 MG PO TBEC
40.0000 mg | DELAYED_RELEASE_TABLET | Freq: Every day | ORAL | Status: DC
  Administered 2022-12-24 – 2022-12-25 (×2): 40 mg via ORAL
  Filled 2022-12-24 (×2): qty 1

## 2022-12-24 MED ORDER — HYDROCHLOROTHIAZIDE 25 MG PO TABS
25.0000 mg | ORAL_TABLET | Freq: Every day | ORAL | Status: DC
  Administered 2022-12-24 – 2022-12-25 (×2): 25 mg via ORAL
  Filled 2022-12-24 (×2): qty 1

## 2022-12-24 MED ORDER — CLONIDINE HCL 0.1 MG PO TABS
0.1000 mg | ORAL_TABLET | Freq: Two times a day (BID) | ORAL | Status: DC | PRN
  Administered 2022-12-24 – 2022-12-25 (×2): 0.1 mg via ORAL
  Filled 2022-12-24 (×4): qty 1

## 2022-12-24 MED ORDER — TRAMADOL HCL 50 MG PO TABS
200.0000 mg | ORAL_TABLET | Freq: Every morning | ORAL | Status: DC
  Administered 2022-12-24 – 2022-12-25 (×2): 200 mg via ORAL
  Filled 2022-12-24 (×2): qty 4

## 2022-12-24 MED ORDER — LACTATED RINGERS IV SOLN: INTRAVENOUS | Status: AC

## 2022-12-24 NOTE — Progress Notes (Signed)
   CT head showed mild chronic white matter small vessel ischemic changes without any evidence of acute intracranial abnormality. - Ruled out any neurological or origin of near syncope. -It is now safe to start  pharmacological DVT prophylaxis with heparin 5000 3 times daily.   Tereasa Coop, MD [facility] 12/24/2022, 12:09 AM

## 2022-12-24 NOTE — Progress Notes (Signed)
HOSPITALIST ROUNDING NOTE Hayley Miller WUJ:[id number]  DOB: 10-19-1960  DOA: 12/23/2022  PCP: Tresa Garter, MD  12/24/2022,7:29 AM   LOS: 1 [place]      Code Status: full    From:  home    Current Dispo: home       62 year old home dwelling black female CAD MI 2006 PTCA LAD-LHC 06/2017 normal coronaries NSAID UGIB 2009 Poor blood pressure control at baseline Anxiety disorder Prior vasovagal syncope Incontinence + urethropexy in the setting of prior 0  Came to [facility] ED nausea, dizzy + diaphoresis + ABD pain--- reported to admitting physician that she started taking Cardura 4 mg a month ago and since then has not been feeling well and has been feeling "dizzy" with racing of her heart when she stands as well as nausea/headache Initial blood pressure 80 systolic patient was pale and diaphoretic Per EDP  Workup revealed lab error platelet of 7 later repeated normal BUN/creatinine 26/2.0 (19/1.3) CO2 23 anion gap 8 Troponin gray zone 40 Lactic acid 3.3-->2.3 WBCs 8 hemoglobin 12 platelets 324  CT chest abdomen pelvis no acute aortic findings bilateral nonobstructing nephrolithiasis CXR no active disease CT head mild white chronic white matter ischemic changes  In ED Rx 2 L saline-EKG = T wave inversions? Bedside US performed by ED showed pericardial effusion CT chest however negative-formal echocardiogram pending  Plan  Multifactorial syncope-?  Likely secondary to volume depletion, with vagal and/or incorrect administration of meds She tells me she made over taken various meds-here her blood pressure seems pretty much within normal range except for a slightly high 1 of 160 this morning Obtain orthostatics Q8 and then stop Would continue Coreg 25 twice daily, hold Cardura 4 mg, hold hydralazine 100 twice daily and observe her blood pressure overnight-if stabilized on single agent will discharge on this for close follow-up with Dr. Posey Rea She thinks she might not be taking her meds  correctly so she will need a med planner and I will ask TOC to help her set this up  Lactic acidosis Probably secondary to profound hypotension-do not suspect a infectious etiology Would not trend as no fever--- no further workup  AKI on admission Mild hypokalemia on admission-replaced Check labs in a.m.-cut back the saline from 100 cc/H-50 cc/H-likely 2/2 hypotension Ideally she would be on an ACE inhibitor as outpatient Given that she has a history of incontinence we need to ensure that this is not obstructive uropathy and we can BladderScan her if her kidney function worsens  Sinusitis Have encouraged her to use her Astelin-she does not like this however-I have replaced her on her mometasone and she will need follow-up with her primary care physician or get an allergy referral  EKG abnormalities,?  Pericardial effusion Formal echocardiogram pending-no chest pain-she does have a CAD history but her symptoms are not consistent with ACS  CAD PTCA 2006 Continue aspirin 81 mg, GDMT as able Hold other meds  NSAID induced UGIB 2009 Continue Protonix as a substitution for Nexium  Anxiety disorder Prior daily drinker Not on any meds for anxiety currently-no signs of withdrawal-monitor only for now  Incontinence previously   DVT prophylaxis: SCD  Status is: Inpatient Remains inpatient appropriate because:   Requires med adjustment and lab checks    Subjective: Tells me feels better ambulated to the restroom in the ED No chest pain no chills no rigor Some headaches from her sinuses Hungry and wants to eat   Objective + exam Vitals:   12/24/22  0400 12/24/22 0500 12/24/22 0539 12/24/22 0630  BP: 123/76 (!) 162/97    Pulse:  81 97   Resp: (!) 23 15 (!) 22   Temp:    98.5 F (36.9 C)  TempSrc:    Oral  SpO2:  97% 98%   Weight:      Height:       Filed Weights   12/23/22 2248  Weight: 70.8 kg    Examination:  EOMI NCAT Mallampati 2 no thyromegaly Does have poor  dentition posterior right jaw S1-S2 no murmur seems to be in sinus Abdomen is soft no rebound no guarding Chest is clear No lower extremity edema Neuro intact  Data Reviewed: reviewed   CBC    Component Value Date/Time   WBC 8.7 12/24/2022 0536   RBC 4.18 12/24/2022 0536   HGB 12.9 12/24/2022 0536   HGB 15.0 07/26/2017 0913   HCT 40.1 12/24/2022 0536   HCT 44.3 07/26/2017 0913   PLT 324 12/24/2022 0536   PLT 376 07/26/2017 0913   MCV 95.9 12/24/2022 0536   MCV 94 07/26/2017 0913   MCH 30.9 12/24/2022 0536   MCHC 32.2 12/24/2022 0536   RDW 13.0 12/24/2022 0536   RDW 13.2 07/26/2017 0913   LYMPHSABS 2.6 10/29/2020 1419   MONOABS 0.7 10/29/2020 1419   EOSABS 0.1 10/29/2020 1419   BASOSABS 0.1 10/29/2020 1419      Latest Ref Rng & Units 12/24/2022    5:36 AM 12/23/2022    4:57 PM 12/23/2022    4:38 PM  CMP  Glucose 70 - 99 mg/dL 811  914  782   BUN 6 - 20 mg/dL 18  22  26    Creatinine 0.44 - 1.00 mg/dL 9.56  2.13  0.86   Sodium 135 - 145 mmol/L 136  139  141   Potassium 3.5 - 5.1 mmol/L 4.4  3.4  3.7   Chloride 98 - 111 mmol/L 105  104  108   CO2 22 - 32 mmol/L 23  24    Calcium 8.9 - 10.3 mg/dL 57.8  9.9    Total Protein 6.5 - 8.1 g/dL 7.0  8.0    Total Bilirubin 0.3 - 1.2 mg/dL 0.7  0.5    Alkaline Phos 38 - 126 U/L 70  79    AST 15 - 41 U/L 18  22    ALT 0 - 44 U/L 15  16       Scheduled Meds:  aspirin EC  81 mg Oral Daily   carvedilol  25 mg Oral BID   heparin injection (subcutaneous)  5,000 Units Subcutaneous Q8H   sodium chloride flush  3 mL Intravenous Q12H   Continuous Infusions:  sodium chloride     lactated ringers 100 mL/hr at 12/24/22 0700    Time 43  Rhetta Mura, MD  [facility]

## 2022-12-24 NOTE — Progress Notes (Signed)
   12/24/22 1315  Vitals  Temp 98 F (36.7 C)  Temp Source Oral  BP (!) 193/105  MAP (mmHg) 131  BP Method Automatic  Pulse Rate 88  Pulse Rate Source Monitor  Resp 20  MEWS COLOR  MEWS Score Color Green  Oxygen Therapy  SpO2 98 %  O2 Device Room Air  MEWS Score  MEWS Temp 0  MEWS Systolic 0  MEWS Pulse 0  MEWS RR 0  MEWS LOC 0  MEWS Score 0   MD notified. New orders received. Will continue to monitor.

## 2022-12-24 NOTE — Progress Notes (Signed)
PT Cancellation Note  Patient Details Name: Hayley Miller MRN: [id number] DOB: Aug 15, 1960   Cancelled Treatment:    Reason Eval/Treat Not Completed: Medical issues which prohibited therapy, BP remains high. Will check back tomorrow. Blanchard Kelch PT Acute Rehabilitation Services Office [phone] Weekend pager-[phone]    [name] 12/24/2022, 3:33 PM

## 2022-12-24 NOTE — ED Notes (Signed)
ED TO INPATIENT HANDOFF REPORT  ED Nurse Name and Phone #: Capone Schwinn  S Name/Age/Gender Hayley Miller 62 y.o. female Room/Bed: WA01/WA01  Code Status   Code Status: Full Code  Home/SNF/Other Home Patient oriented to: self, place, time, and situation Is this baseline? Yes   Triage Complete: Triage complete  Chief Complaint Near syncope [R55]  Triage Note Pt arrived for nausea and dizziness that started a few hours ago suddenly. No vomiting. States she was diaphoretic. Denies any cp,shob or any other symptom.    Allergies Allergies  Allergen Reactions   Amlodipine     gingival hypertrophy   Clonidine Hydrochloride Other (See Comments)    REACTION: FATIGUE   Codeine Nausea And Vomiting    Unknown reaction (patient reports taking cough syrup with codeine)   Gabapentin     Too sleepy   Ibuprofen Other (See Comments)    GI bleed   Lovastatin Other (See Comments)    Joint pain   Spironolactone     fatigue    Level of Care/Admitting Diagnosis ED Disposition     ED Disposition  Admit   Condition  --   Comment  Hospital Area: [facility] [[id number]]  Level of Care: Telemetry [5]  Admit to tele based on following criteria: Eval of Syncope  May admit patient to [facility] or [facility] if equivalent level of care is available:: No  Covid Evaluation: Asymptomatic - no recent exposure (last 10 days) testing not required  Diagnosis: Near syncope [692301]  Admitting Physician: Tereasa Coop [[id number]]  Attending Physician: Tereasa Coop [[id number]]  Certification:: I certify this patient will need inpatient services for at least 2 midnights  Expected Medical Readiness: 12/28/2022          B Medical/Surgery History Past Medical History:  Diagnosis Date   Allergy    Anemia    Anxiety    CAD (coronary artery disease)    a. s/p AL MI in 2006 tx with PTCA to LAD;  b. LHC (10/2009): lum irregs in LAD, o/w no CAD, EF 55-60%;  c. Myoview  (07/2011):  no ischemia, EF 63%;  d. Echo (11/2011):  mild LVH, EF 60-65%, Gr 1 DD;  e.  Lexiscan Myoview (04/2013):  Inferior bowel atten artifact with inferoapical defect; no ischemia; EF 58% (low risk)   Cameron lesion, acute    Chronic lower back pain    Chronic pain syndrome    Daily headache    Depression    Erosive esophagitis    Fibromyalgia    GERD (gastroesophageal reflux disease)    Hemorrhage    upper gastrointestional. GI bleed 10/09 due to NSAID   Hiatal hernia    HLD (hyperlipidemia)    HTN (hypertension)    Myocardial infarction (HCC) 2006 X 2   Rhinitis    Seasonal allergies    Past Surgical History:  Procedure Laterality Date   BLADDER SUSPENSION N/A 05/08/2013   Procedure: TRANSVAGINAL TAPE (TVT) PROCEDURE;  Surgeon: Levi Aland, MD;  Location: [facility];  Service: Gynecology;  Laterality: N/A;   BREAST CYST EXCISION Left 2002   CARDIAC CATHETERIZATION  07/19/2017   COLONOSCOPY     CORONARY ANGIOPLASTY  2006   CYSTOSCOPY N/A 05/08/2013   Procedure: CYSTOSCOPY;  Surgeon: Levi Aland, MD;  Location: [facility];  Service: Gynecology;  Laterality: N/A;   LAPAROSCOPIC ASSISTED VAGINAL HYSTERECTOMY N/A 05/08/2013   Procedure: LAPAROSCOPIC ASSISTED VAGINAL HYSTERECTOMY;  Surgeon: Levi Aland, MD;  Location: [facility]  ORS;  Service: Gynecology;  Laterality: N/A;   LAPAROSCOPIC BILATERAL SALPINGO OOPHERECTOMY N/A 05/08/2013   Procedure: LAPAROSCOPIC BILATERAL SALPINGO OOPHORECTOMY;  Surgeon: Levi Aland, MD;  Location: [facility];  Service: Gynecology;  Laterality: N/A;   LEFT HEART CATH AND CORONARY ANGIOGRAPHY N/A 07/19/2017   Procedure: LEFT HEART CATH AND CORONARY ANGIOGRAPHY;  Surgeon: Swazil[place], Peter M, MD;  Location: [facility] INVASIVE CV LAB;  Service: Cardiovascular;  Laterality: N/A;   UPPER GASTROINTESTINAL ENDOSCOPY       A IV Location/Drains/Wounds Patient Lines/Drains/Airways Status     Active Line/Drains/Airways     Name Placement date Placement time Site Days    Peripheral IV 12/23/22 20 G Anterior;Left Forearm 12/23/22  1646  Forearm  1            Intake/Output Last 24 hours  Intake/Output Summary (Last 24 hours) at 12/24/2022 0809 Last data filed at 12/23/2022 2302 Gross per 24 hour  Intake 3 ml  Output --  Net 3 ml    Labs/Imaging Results for orders placed or performed during the hospital encounter of 12/23/22 (from the past 48 hour(s))  CBG monitoring, ED     Status: Abnormal   Collection Time: 12/23/22  4:27 PM  Result Value Ref Range   Glucose-Capillary 153 (H) 70 - 99 mg/dL    Comment: Glucose reference range applies only to samples taken after fasting for at least 8 hours.  I-stat chem 8, ED (not at [facility], [facility] or [facility])     Status: Abnormal   Collection Time: 12/23/22  4:38 PM  Result Value Ref Range   Sodium 141 135 - 145 mmol/L   Potassium 3.7 3.5 - 5.1 mmol/L   Chloride 108 98 - 111 mmol/L   BUN 26 (H) 6 - 20 mg/dL   Creatinine, Ser 9.60 (H) 0.44 - 1.00 mg/dL   Glucose, Bld 454 (H) 70 - 99 mg/dL    Comment: Glucose reference range applies only to samples taken after fasting for at least 8 hours.   Calcium, Ion 1.20 1.15 - 1.40 mmol/L   TCO2 22 22 - 32 mmol/L   Hemoglobin 16.0 (H) 12.0 - 15.0 g/dL   HCT 09.8 (H) 11.9 - 14.7 %  Basic metabolic panel     Status: Abnormal   Collection Time: 12/23/22  4:57 PM  Result Value Ref Range   Sodium 139 135 - 145 mmol/L   Potassium 3.4 (L) 3.5 - 5.1 mmol/L   Chloride 104 98 - 111 mmol/L   CO2 24 22 - 32 mmol/L   Glucose, Bld 161 (H) 70 - 99 mg/dL    Comment: Glucose reference range applies only to samples taken after fasting for at least 8 hours.   BUN 22 (H) 6 - 20 mg/dL   Creatinine, Ser 8.29 (H) 0.44 - 1.00 mg/dL   Calcium 9.9 8.9 - 56.2 mg/dL   GFR, Estimated 32 (L) >60 mL/min    Comment: (NOTE) Calculated using the CKD-EPI Creatinine Equation (2021)    Anion gap 11 5 - 15    Comment: Performed at [facility], [address] [place], [place] [address]   CBC     Status: Abnormal   Collection Time: 12/23/22  4:57 PM  Result Value Ref Range   WBC 6.9 4.0 - 10.5 K/uL   RBC 4.40 3.87 - 5.11 MIL/uL   Hemoglobin 13.6 12.0 - 15.0 g/dL   HCT 57.8 46.9 - 62.9 %   MCV 98.2 80.0 - 100.0 fL  MCH 30.9 26.0 - 34.0 pg   MCHC 31.5 30.0 - 36.0 g/dL   RDW 16.1 09.6 - 04.5 %   Platelets 7 (LL) 150 - 400 K/uL    Comment: SPECIMEN CHECKED FOR CLOTS Immature Platelet Fraction may be clinically indicated, consider ordering this additional test [id number] REPEATED TO VERIFY PLATELET COUNT CONFIRMED BY SMEAR CRITICAL RESULT CALLED TO, READ BACK BY AND VERIFIED WITH: P.DOWD, RN AT 1747 ON 09.26.24 BY N.THOMPSON    nRBC 0.0 0.0 - 0.2 %    Comment: Performed at [facility], [address] [place], [place] [address]  Lipase, blood     Status: None   Collection Time: 12/23/22  4:57 PM  Result Value Ref Range   Lipase 24 11 - 51 U/L    Comment: Performed at [facility], [address], [place], [place] [address]  Troponin I (High Sensitivity)     Status: Abnormal   Collection Time: 12/23/22  4:57 PM  Result Value Ref Range   Troponin I (High Sensitivity) 43 (H) <18 ng/L    Comment: (NOTE) Elevated high sensitivity troponin I (hsTnI) values and significant  changes across serial measurements may suggest ACS but many other  chronic and acute conditions are known to elevate hsTnI results.  Refer to the "Links" section for chest pain algorithms and additional  guidance. Performed at [facility], [address] [place], [place] [address]   Hepatic function panel     Status: None   Collection Time: 12/23/22  4:57 PM  Result Value Ref Range   Total Protein 8.0 6.5 - 8.1 g/dL   Albumin 4.1 3.5 - 5.0 g/dL   AST 22 15 - 41 U/L   ALT 16 0 - 44 U/L   Alkaline Phosphatase 79 38 - 126 U/L   Total Bilirubin 0.5 0.3 - 1.2 mg/dL   Bilirubin, Direct 0.1 0.0 - 0.2 mg/dL   Indirect Bilirubin 0.4 0.3 -  0.9 mg/dL    Comment: Performed at [facility], [address] [place], [place] [address]  Urinalysis, Routine w reflex microscopic -Urine, Clean Catch     Status: Abnormal   Collection Time: 12/23/22  6:00 PM  Result Value Ref Range   Color, Urine YELLOW YELLOW   APPearance CLEAR CLEAR   Specific Gravity, Urine >1.046 (H) 1.005 - 1.030   pH 6.0 5.0 - 8.0   Glucose, UA NEGATIVE NEGATIVE mg/dL   Hgb urine dipstick NEGATIVE NEGATIVE   Bilirubin Urine NEGATIVE NEGATIVE   Ketones, ur NEGATIVE NEGATIVE mg/dL   Protein, ur NEGATIVE NEGATIVE mg/dL   Nitrite NEGATIVE NEGATIVE   Leukocytes,Ua SMALL (A) NEGATIVE   RBC / HPF 0-5 0 - 5 RBC/hpf   WBC, UA 0-5 0 - 5 WBC/hpf   Bacteria, UA RARE (A) NONE SEEN   Squamous Epithelial / HPF 0-5 0 - 5 /HPF   Hyaline Casts, UA PRESENT     Comment: Performed at [facility], [address], [place], [place] [address]  Type and screen [facility]     Status: None   Collection Time: 12/23/22  6:22 PM  Result Value Ref Range   ABO/RH(D) A POS    Antibody Screen NEG    Sample Expiration      12/26/2022,2359 Performed at [facility], [address], [place], [place] [address]   I-Stat CG4 Lactic Acid     Status: Abnormal   Collection Time:  12/23/22  6:29 PM  Result Value Ref Range   Lactic Acid, Venous 3.3 (HH) 0.5 - 1.9 mmol/L  Blood gas, venous     Status: Abnormal   Collection Time: 12/23/22  6:55 PM  Result Value Ref Range   pH, Ven 7.37 7.25 - 7.43   pCO2, Ven 43 (L) 44 - 60 mmHg   pO2, Ven 36 32 - 45 mmHg   Bicarbonate 24.9 20.0 - 28.0 mmol/L   Acid-base deficit 0.6 0.0 - 2.0 mmol/L   O2 Saturation 68.2 %   Patient temperature 37.0     Comment: Performed at [facility], [address], [place], [place] [address]  Troponin I (High Sensitivity)     Status: Abnormal   Collection Time: 12/23/22  6:55 PM  Result Value Ref Range   Troponin I (High  Sensitivity) 42 (H) <18 ng/L    Comment: (NOTE) Elevated high sensitivity troponin I (hsTnI) values and significant  changes across serial measurements may suggest ACS but many other  chronic and acute conditions are known to elevate hsTnI results.  Refer to the "Links" section for chest pain algorithms and additional  guidance. Performed at [facility], [address], [place], [place] [address]   DIC Panel Once     Status: None   Collection Time: 12/23/22  6:55 PM  Result Value Ref Range   Prothrombin Time 14.0 11.4 - 15.2 seconds   INR 1.1 0.8 - 1.2    Comment: (NOTE) INR goal varies based on device and disease states.    aPTT 26 24 - 36 seconds   Fibrinogen 330 210 - 475 mg/dL    Comment: (NOTE) Fibrinogen results may be underestimated in patients receiving thrombolytic therapy.    D-Dimer, Quant <0.27 0.00 - 0.50 ug/mL-FEU    Comment: (NOTE) At the manufacturer cut-off value of 0.5 g/mL FEU, this assay has a negative predictive value of 95-100%.This assay is intended for use in conjunction with a clinical pretest probability (PTP) assessment model to exclude pulmonary embolism (PE) and deep venous thrombosis (DVT) in outpatients suspected of PE or DVT. Results should be correlated with clinical presentation.    Platelets 325 150 - 400 K/uL    Comment: REPEATED TO VERIFY PLATELET COUNT CONFIRMED BY SMEAR DELTA CHECK NOTED    Smear Review NO SCHISTOCYTES SEEN     Comment: Performed at [facility], [address], [place], [place] [address]  Lactate dehydrogenase     Status: None   Collection Time: 12/23/22  6:55 PM  Result Value Ref Range   LDH 132 98 - 192 U/L    Comment: Performed at [facility], [address], [place], [place] [address]  Prepare platelet pheresis     Status: None (Preliminary result)   Collection Time: 12/23/22  7:22 PM  Result Value Ref Range   Unit Number [id number]    Blood  Component Type PLTP1 PSORALEN TREATED    Unit division 00    Status of Unit ALLOCATED    Transfusion Status OK TO TRANSFUSE   Platelet count     Status: None   Collection Time: 12/23/22  7:44 PM  Result Value Ref Range   Platelets 347 150 - 400 K/uL    Comment: RESULTS VERIFIED VIA RECOLLECT Performed at [facility], [address], [place], [place] [address]   I-Stat CG4 Lactic Acid     Status: Abnormal   Collection Time: 12/23/22  8:09 PM  Result Value Ref Range   Lactic Acid, Venous 2.9 (HH) 0.5 - 1.9 mmol/L   Comment NOTIFIED PHYSICIAN   Troponin I (High Sensitivity)     Status: Abnormal   Collection Time: 12/23/22 11:20 PM  Result Value Ref Range   Troponin I (High Sensitivity) 44 (H) <18 ng/L    Comment: (NOTE) Elevated high sensitivity troponin I (hsTnI) values and significant  changes across serial measurements may suggest ACS but many other  chronic and acute conditions are known to elevate hsTnI results.  Refer to the "Links" section for chest pain algorithms and additional  guidance. Performed at [facility], [address], [place], [place] [address]   Magnesium     Status: None   Collection Time: 12/23/22 11:20 PM  Result Value Ref Range   Magnesium 1.7 1.7 - 2.4 mg/dL    Comment: Performed at [facility], [address] [place], [place] [address]  Troponin I (High Sensitivity)     Status: Abnormal   Collection Time: 12/24/22  1:31 AM  Result Value Ref Range   Troponin I (High Sensitivity) 44 (H) <18 ng/L    Comment: (NOTE) Elevated high sensitivity troponin I (hsTnI) values and significant  changes across serial measurements may suggest ACS but many other  chronic and acute conditions are known to elevate hsTnI results.  Refer to the "Links" section for chest pain algorithms and additional  guidance. Performed at [facility], [address], [place], [place] [address]    Comprehensive metabolic panel     Status: Abnormal   Collection Time: 12/24/22  5:36 AM  Result Value Ref Range   Sodium 136 135 - 145 mmol/L   Potassium 4.4 3.5 - 5.1 mmol/L   Chloride 105 98 - 111 mmol/L   CO2 23 22 - 32 mmol/L   Glucose, Bld 130 (H) 70 - 99 mg/dL    Comment: Glucose reference range applies only to samples taken after fasting for at least 8 hours.   BUN 18 6 - 20 mg/dL   Creatinine, Ser 0.27 (H) 0.44 - 1.00 mg/dL   Calcium 25.3 8.9 - 66.4 mg/dL   Total Protein 7.0 6.5 - 8.1 g/dL   Albumin 3.5 3.5 - 5.0 g/dL   AST 18 15 - 41 U/L   ALT 15 0 - 44 U/L   Alkaline Phosphatase 70 38 - 126 U/L   Total Bilirubin 0.7 0.3 - 1.2 mg/dL   GFR, Estimated 50 (L) >60 mL/min    Comment: (NOTE) Calculated using the CKD-EPI Creatinine Equation (2021)    Anion gap 8 5 - 15    Comment: Performed at [facility], [address] [place], [place] [address]  CBC     Status: None   Collection Time: 12/24/22  5:36 AM  Result Value Ref Range   WBC 8.7 4.0 - 10.5 K/uL   RBC 4.18 3.87 - 5.11 MIL/uL   Hemoglobin 12.9 12.0 - 15.0 g/dL   HCT 42.5 95.6 - 38.7 %   MCV 95.9 80.0 - 100.0 fL   MCH 30.9 26.0 - 34.0 pg   MCHC 32.2 30.0 - 36.0 g/dL   RDW 56.4 33.2 - 95.1 %   Platelets 324 150 - 400 K/uL   nRBC 0.0 0.0 - 0.2 %    Comment: Performed at [facility], [address] [place], [place] [address]  Lactic acid, plasma     Status: Abnormal  Collection Time: 12/24/22  5:36 AM  Result Value Ref Range   Lactic Acid, Venous 2.3 (HH) 0.5 - 1.9 mmol/L    Comment: CRITICAL RESULT CALLED TO, READ BACK BY AND VERIFIED WITH HALL, C. RA AT 1610 ON 12/24/2022 BY MECIA L J. Performed at [facility], [address] [place], [place] [address]    CT HEAD WO CONTRAST ( )  Result Date: 12/24/2022 CLINICAL DATA:  Near syncope. EXAM: CT HEAD WITHOUT CONTRAST TECHNIQUE: Contiguous axial images were obtained from the base of the skull  through the vertex without intravenous contrast. RADIATION DOSE REDUCTION: This exam was performed according to the departmental dose-optimization program which includes automated exposure control, adjustment of the mA and/or kV according to patient size and/or use of iterative reconstruction technique. COMPARISON:  July 18, 2017 FINDINGS: Brain: No evidence of acute infarction, hemorrhage, hydrocephalus, extra-axial collection or mass lesion/mass effect. Mild areas of decreased attenuation are seen within the white matter tracts of the supratentorial brain, consistent with microvascular disease changes. Vascular: No hyperdense vessel or unexpected calcification. Skull: Normal. Negative for fracture or focal lesion. Sinuses/Orbits: No acute finding. Other: None. IMPRESSION: Mild, chronic white matter small vessel ischemic changes without evidence of an acute intracranial abnormality. Electronically Signed   By: Aram Candela M.D.   On: 12/24/2022 00:05   DG Chest Port 1 View  Result Date: 12/23/2022 CLINICAL DATA:  Shortness of breath EXAM: PORTABLE CHEST 1 VIEW COMPARISON:  12/14/2018 FINDINGS: Borderline cardiomegaly. No acute airspace disease or effusion. No pneumothorax. IMPRESSION: No active disease. Electronically Signed   By: Jasmine Pang M.D.   On: 12/23/2022 20:08   CT Angio Chest/Abd/Pel for Dissection W and/or Wo Contrast  Result Date: 12/23/2022 CLINICAL DATA:  Nausea and dizziness for a few hours. EXAM: CT ANGIOGRAPHY CHEST, ABDOMEN AND PELVIS TECHNIQUE: Non-contrast CT of the chest was initially obtained. Multidetector CT imaging through the chest, abdomen and pelvis was performed using the standard protocol during bolus administration of intravenous contrast. Multiplanar reconstructed images and MIPs were obtained and reviewed to evaluate the vascular anatomy. RADIATION DOSE REDUCTION: This exam was performed according to the departmental dose-optimization program which includes  automated exposure control, adjustment of the mA and/or kV according to patient size and/or use of iterative reconstruction technique. CONTRAST:  OMNIPAQUE IOHEXOL 350 MG/ML SOLN COMPARISON:  Radiograph 12/14/2018 and CTA chest abdomen and pelvis 07/18/2017 FINDINGS: CTA CHEST FINDINGS Cardiovascular: No intramural hematoma, aneurysm, or dissection in the thoracic aorta. No pericardial effusion. No central pulmonary embolism Mediastinum/Nodes: Small hiatal hernia. Trachea is unremarkable. No thoracic adenopathy. Lungs/Pleura: Bilateral lower lobe scarring and atelectasis with mild bronchiolectasis likely postinfectious. No pleural effusion or pneumothorax Musculoskeletal: No acute fracture. Review of the MIP images confirms the above findings. CTA ABDOMEN AND PELVIS FINDINGS VASCULAR Mild scattered calcified atherosclerotic plaque without hemodynamically significant stenosis. No aortic aneurysm or dissection. The mesenteric and renal arteries are widely patent. Widely patent inflow and outflow arteries bilaterally. Review of the MIP images confirms the above findings. NON-VASCULAR Hepatobiliary: No focal liver abnormality is seen. No gallstones, gallbladder wall thickening, or biliary dilatation. Pancreas: Unremarkable. Spleen: Unremarkable. Adrenals/Urinary Tract: Stable adrenal glands. Bilateral nonobstructing nephrolithiasis. No hydronephrosis. Unremarkable bladder. Stomach/Bowel: Normal caliber large and small bowel. No bowel wall thickening. Colonic diverticulosis without diverticulitis. Normal appendix. Stomach is within normal limits. Lymphatic: No lymphadenopathy. Reproductive: Hysterectomy. Other: No free intraperitoneal fluid or air. Musculoskeletal: No acute fracture. Review of the MIP images confirms the above findings. IMPRESSION: 1. No acute  aortic syndrome. 2. No acute findings in the chest, abdomen, or pelvis. 3. Bilateral nonobstructing nephrolithiasis. 4. Colonic diverticulosis without  diverticulitis. Electronically Signed   By: Minerva Fester M.D.   On: 12/23/2022 20:01    Pending Labs Unresulted Labs (From admission, onward)     Start     Ordered   12/24/22 0500  Comprehensive metabolic panel  Daily,   R      12/23/22 2244   12/24/22 0500  CBC  Daily,   R      12/23/22 2244   12/23/22 2245  HIV Antibody (routine testing w rflx)  (HIV Antibody (Routine testing w reflex) panel)  Once,   R        12/23/22 2244   12/23/22 1845  ABO/Rh  Once,   R        12/23/22 1845   12/23/22 1840  Haptoglobin  Once,   URGENT        12/23/22 1839            Vitals/Pain Today's Vitals   12/24/22 0500 12/24/22 0539 12/24/22 0630 12/24/22 0652  BP: (!) 162/97     Pulse: 81 97    Resp: 15 (!) 22    Temp:   98.5 F (36.9 C)   TempSrc:   Oral   SpO2: 97% 98%    Weight:      Height:      PainSc:    2     Isolation Precautions No active isolations  Medications Medications  aspirin EC tablet 81 mg (has no administration in time range)  carvedilol (COREG) tablet 25 mg (25 mg Oral Given 12/23/22 2314)  sodium chloride flush (NS) 0.9 % injection 3 mL (3 mLs Intravenous Given 12/23/22 2302)  sodium chloride flush (NS) 0.9 % injection 3 mL (has no administration in time range)  0.9 %  sodium chloride infusion (has no administration in time range)  acetaminophen (TYLENOL) tablet 650 mg (has no administration in time range)    Or  acetaminophen (TYLENOL) suppository 650 mg (has no administration in time range)  polyethylene glycol (MIRALAX / GLYCOLAX) packet 17 g (has no administration in time range)  ondansetron (ZOFRAN) tablet 4 mg (has no administration in time range)    Or  ondansetron (ZOFRAN) injection 4 mg (has no administration in time range)  albuterol (PROVENTIL) (2.5 MG/3ML) 0.083% nebulizer solution 2.5 mg (has no administration in time range)  butalbital-acetaminophen-caffeine (FIORICET) 50-325-40 MG per tablet 1 tablet (1 tablet Oral Given by Other 12/24/22 0546)   lactated ringers infusion ( Intravenous Rate/Dose Change 12/24/22 0700)  heparin injection 5,000 Units (5,000 Units Subcutaneous Given 12/24/22 0547)  lactated ringers bolus 1,000 mL (0 mLs Intravenous Stopped 12/23/22 2000)  iohexol (OMNIPAQUE) 350 MG/ML injection 100 mL (100 mLs Intravenous Contrast Given 12/23/22 1711)  dexamethasone (DECADRON) injection 40 mg (40 mg Intravenous Given 12/23/22 1954)  0.9 %  sodium chloride infusion (Manually program via Guardrails IV Fluids) (0 mLs Intravenous Stopped 12/24/22 0707)  lactated ringers bolus 1,000 mL (0 mLs Intravenous Stopped 12/23/22 2147)  potassium chloride (KLOR-CON) packet 40 mEq (40 mEq Oral Given 12/23/22 2314)    Mobility walks     Focused Assessments    R Recommendations: See Admitting Provider Note  Report given to:   Additional Notes:

## 2022-12-24 NOTE — Plan of Care (Signed)
  Problem: Education: Goal: Knowledge of General Education information will improve Description: Including pain rating scale, medication(s)/side effects and non-pharmacologic comfort measures Outcome: Progressing   Problem: Clinical Measurements: Goal: Diagnostic test results will improve Outcome: Progressing   Problem: Activity: Goal: Risk for activity intolerance will decrease Outcome: Progressing   Problem: Pain Managment: Goal: General experience of comfort will improve Outcome: Progressing   Problem: Safety: Goal: Ability to remain free from injury will improve Outcome: Progressing   

## 2022-12-25 DIAGNOSIS — R55 Syncope and collapse: Secondary | ICD-10-CM | POA: Diagnosis not present

## 2022-12-25 LAB — HAPTOGLOBIN: Haptoglobin: 98 mg/dL (ref 33–346)

## 2022-12-25 LAB — COMPREHENSIVE METABOLIC PANEL
ALT: 15 U/L (ref 0–44)
AST: 16 U/L (ref 15–41)
Albumin: 3.7 g/dL (ref 3.5–5.0)
Alkaline Phosphatase: 68 U/L (ref 38–126)
Anion gap: 8 (ref 5–15)
BUN: 25 mg/dL — ABNORMAL HIGH (ref 6–20)
CO2: 26 mmol/L (ref 22–32)
Calcium: 10.2 mg/dL (ref 8.9–10.3)
Chloride: 102 mmol/L (ref 98–111)
Creatinine, Ser: 1.37 mg/dL — ABNORMAL HIGH (ref 0.44–1.00)
GFR, Estimated: 44 mL/min — ABNORMAL LOW (ref >60)
Glucose, Bld: 100 mg/dL — ABNORMAL HIGH (ref 70–99)
Potassium: 3.9 mmol/L (ref 3.5–5.1)
Sodium: 136 mmol/L (ref 135–145)
Total Bilirubin: 0.3 mg/dL (ref 0.3–1.2)
Total Protein: 7 g/dL (ref 6.5–8.1)

## 2022-12-25 LAB — CBC
HCT: 37.8 % (ref 36.0–46.0)
Hemoglobin: 12 g/dL (ref 12.0–15.0)
MCH: 30.2 pg (ref 26.0–34.0)
MCHC: 31.7 g/dL (ref 30.0–36.0)
MCV: 95 fL (ref 80.0–100.0)
Platelets: 340 10*3/uL (ref 150–400)
RBC: 3.98 MIL/uL (ref 3.87–5.11)
RDW: 12.9 % (ref 11.5–15.5)
WBC: 12.4 10*3/uL — ABNORMAL HIGH (ref 4.0–10.5)
nRBC: 0 % (ref 0.0–0.2)

## 2022-12-25 MED ORDER — HYDROCHLOROTHIAZIDE 25 MG PO TABS: 25.0000 mg | ORAL_TABLET | Freq: Every day | ORAL | 2 refills | Status: DC

## 2022-12-25 MED ORDER — HYDRALAZINE HCL 50 MG PO TABS
100.0000 mg | ORAL_TABLET | Freq: Two times a day (BID) | ORAL | Status: DC
  Administered 2022-12-25: 100 mg via ORAL
  Filled 2022-12-25: qty 2

## 2022-12-25 NOTE — TOC Transition Note (Addendum)
Transition of Care [facility]) - CM/SW Discharge Note   Patient Details  Name: Hayley Miller MRN: [id number] Date of Birth: [birth year]  [facility] CM/SW Contact:  Darleene Cleaver, LCSW Phone Number: 12/25/2022, 12:22 PM   Clinical Narrative:     TOC Brief Assessment   Insurance and Status Insurance coverage has been reviewed  Patient has primary care physician Yes  Home environment has been reviewed Yes lives alone  Prior level of function: Independent  Prior/Current Home Services No current home services  Social Determinants of Health Reivew SDOH reviewed no interventions necessary  Readmission risk has been reviewed Yes  Transition of care needs no transition of care needs at this time   Patient is a 62 year old female alert and oriented x4.  CSW received consult that patient will need [facility] RN.  CSW spoke to patient and explained the benefit of having a HH RN come to the house.  CSW explained to her the advantages are to help with her medications and making sure she understands them, per patient she does not want to have a HH RN come to her home.  CSW explained to patient that if she changes her mind she would have to speak to her PCP, patient expressed understanding.    CSW asked if patient had any problems affording her medications, getting to her appointments, paying for her meds, or any problems paying for things at home, she said no.  Per patient she does not need any equipment at home either.  TOC signing off, please reconsult if other TOC needs arise.    Final next level of care: Home/Self Care Barriers to Discharge: Barriers Resolved   Patient Goals and CMS Choice CMS Medicare.gov Compare Post Acute Care list provided to:: Patient Choice offered to / list presented to : Patient  Discharge Placement    Patient going back home.                     Discharge Plan and Services Additional resources added to the After Visit Summary for                             [facility] Arranged: Refused HH          Social Determinants of Health (SDOH) Interventions SDOH Screenings   Food Insecurity: No Food Insecurity (12/24/2022)  Housing: Low Risk  (12/24/2022)  Transportation Needs: No Transportation Needs (12/24/2022)  Utilities: Not At Risk (12/24/2022)  Alcohol Screen: Low Risk  (05/19/2022)  Depression (PHQ2-9): Medium Risk (05/19/2022)  Financial Resource Strain: Low Risk  (05/19/2022)  Physical Activity: Inactive (05/19/2022)  Social Connections: Unknown (05/19/2022)  Stress: No Stress Concern Present (05/19/2022)  Tobacco Use: Medium Risk (12/24/2022)     Readmission Risk Interventions     No data to display

## 2022-12-25 NOTE — Evaluation (Signed)
Physical Therapy Evaluation Patient Details Name: Angeli Marine Kamaka MRN: [id number] DOB: 1960-06-26 Today's Date: 12/25/2022  History of Present Illness  Audri Noris Mcafee is a 62 y.o. female with medical history significant of essential hypertension, CAD, COPD, CKD stage IV and GERD presented to emergency department 12/23/22  for evaluation for nausea and near syncopal episode at home.  Clinical Impression  The patient  ambulated x 120', requires no support.        BP prior 173/102, post 173/115. RN/MD aware. Patient will Dc home today per RN. No further PT indicated.      If plan is discharge home, recommend the following: Assistance with cooking/housework;Assist for transportation   Can travel by private vehicle        Equipment Recommendations None recommended by PT  Recommendations for Other Services       Functional Status Assessment       Precautions / Restrictions Precautions Precaution Comments: BP very high      Mobility  Bed Mobility Overal bed mobility: Independent                  Transfers Overall transfer level: Independent                      Ambulation/Gait Ambulation/Gait assistance: Supervision Gait Distance (Feet): 120 Feet Assistive device: None Gait Pattern/deviations: Step-through pattern       General Gait Details: gait very slow  Stairs            Wheelchair Mobility     Tilt Bed    Modified Rankin (Stroke Patients Only)       Balance Overall balance assessment: No apparent balance deficits (not formally assessed)                                           Pertinent Vitals/Pain Pain Assessment Pain Assessment: No/denies pain    Home Living Family/patient expects to be discharged to:: Private residence Living Arrangements: Other relatives Available Help at Discharge: Family;Available PRN/intermittently Type of Home: House Home Access: Stairs to enter   [employer] of  Steps: 1   Home Layout: One level Home Equipment: None      Prior Function Prior Level of Function : Independent/Modified Independent                     Extremity/Trunk Assessment   Upper Extremity Assessment Upper Extremity Assessment: Overall WFL for tasks assessed    Lower Extremity Assessment Lower Extremity Assessment: Generalized weakness    Cervical / Trunk Assessment Cervical / Trunk Assessment: Normal  Communication   Communication Communication: No apparent difficulties  Cognition Arousal: Alert Behavior During Therapy: Flat affect, WFL for tasks assessed/performed Overall Cognitive Status: Within Functional Limits for tasks assessed                                          General Comments      Exercises     Assessment/Plan    PT Assessment Patient does not need any further PT services  PT Problem List         PT Treatment Interventions      PT Goals (Current goals can be found in the Care Plan section)  Acute Rehab PT  Goals Patient Stated Goal: go home, have BP controlled PT Goal Formulation: All assessment and education complete, DC therapy    Frequency       Co-evaluation               AM-PAC PT "6 Clicks" Mobility  Outcome Measure Help needed turning from your back to your side while in a flat bed without using bedrails?: None Help needed moving from lying on your back to sitting on the side of a flat bed without using bedrails?: None Help needed moving to and from a bed to a chair (including a wheelchair)?: None Help needed standing up from a chair using your arms (e.g., wheelchair or bedside chair)?: None Help needed to walk in hospital room?: None Help needed climbing 3-5 steps with a railing? : A Little 6 Click Score: 23    End of Session Equipment Utilized During Treatment: Gait belt Activity Tolerance: Patient limited by fatigue Patient left: in bed;with call bell/phone within reach Nurse  Communication: Mobility status PT Visit Diagnosis: Difficulty in walking, not elsewhere classified (R26.2)    Time: 1050-1105 PT Time Calculation (min) (ACUTE ONLY): 15 min   Charges:   PT Evaluation $PT Eval Low Complexity: 1 Low   PT General Charges $$ ACUTE PT VISIT: 1 Visit         Blanchard Kelch PT Acute Rehabilitation Services Office [phone] Weekend [phone]   [name] 12/25/2022, 1:28 PM

## 2022-12-25 NOTE — Discharge Summary (Signed)
Physician Discharge Summary  Hayley Miller NWG:[id number] DOB: March 05, 1961 DOA: 12/23/2022  PCP: Tresa Garter, MD  Admit date: 12/23/2022 Discharge date: 12/25/2022  Time spent: 40 minutes  Recommendations for Outpatient Follow-up:  Get Chem-12 CBC in about 1 week Would recommend titration of blood pressure meds slowly as an outpatient-has multiple intolerances Please refer to ENT as per our below discussions Please refer to cardiology at their suggestion to outpatient cardiology for assessment of low normal EF  Discharge Diagnoses:  MAIN problem for hospitalization   Hypotension secondary to iatrogenic medication usage for hypertension Probable sinusitis chronic Lactic acidosis secondary to hypotension Prediabetes  Please see below for itemized issues addressed in HOpsital- refer to other progress notes for clarity if needed  Discharge Condition: Fair  Diet recommendation: Heart healthy  Filed Weights   12/23/22 2248  Weight: 70.8 kg    History of present illness:    62 year old home dwelling black female CAD MI 2006 PTCA LAD-LHC 06/2017 normal coronaries NSAID UGIB 2009 Poor blood pressure control at baseline Anxiety disorder Prior vasovagal syncope Incontinence + urethropexy in the setting of prior 0   Came to [facility] nausea, dizzy + diaphoresis + ABD pain--- reported to admitting physician that she started taking Cardura 4 mg a month ago and since then has not been feeling well and has been feeling "dizzy" with racing of her heart when she stands as well as nausea/headache Initial blood pressure 80 systolic patient was pale and diaphoretic Per EDP   Workup revealed lab error platelet of 7 later repeated normal BUN/creatinine 26/2.0 (19/1.3) CO2 23 anion gap 8 Troponin gray zone 40 Lactic acid 3.3-->2.3 WBCs 8 hemoglobin 12 platelets 324   CT chest abdomen pelvis no acute aortic findings bilateral nonobstructing nephrolithiasis CXR no active disease CT  head mild white chronic white matter ischemic changes   In ED Rx 2 L saline-EKG = T wave inversions? Bedside US per emergency department showed a small pericardial effusion however CT did not show any findings consistent with this  Hospital Course:  Multifactorial syncope secondary to hypotension from new medication Cardura over the past month [history of vasovagal syncope previously] Patient admitted to not taking meds correctly and may have overtaken or undertaken some meds We obtain orthostatics that were negative several times Discontinue completely Cardura 4 mg Resumed Coreg and blood pressures were still high and resumed at discharge hydralazine 100 twice daily Added on HCTZ on discharge Needs labs in about 1 week  Low-grade fever 99 with lactic acidosis She had a mild white count but had no constitutional symptoms other than her chronic sinus issues-I would hold antibiotics at this stage She should use her usual inhalers that she has been prescribed-I would recommend she continue her Astelin and her mometasone  AKI on admission Secondary to hypotension on arrival lactic acidosis also explained by this Again no high-grade fever Improved to close to baseline levels with IV fluid repletion  [name] zone troponin without chest pain Prior history CAD with PTCA 2006 Echocardiogram EF 40-45% with mild pericardial effusion and no wall motion abnormality Discussed with Dr. Jomarie Longs on telephone 9/27 he feels this should be manageable as an outpatient and suggest outpatient referral via PCP which can be done  Prior GI bleed in 2009-Protonix   Discharge Exam: Vitals:   12/25/22 0531 12/25/22 1023  BP: (!) 191/123 (!) 173/102  Pulse: 82 70  Resp:  14  Temp:  97.6 F (36.4 C)  SpO2: 100% 96%  Physician Discharge Summary  Hayley Miller NWG:[id number] DOB: March 05, 1961 DOA: 12/23/2022  PCP: Tresa Garter, MD  Admit date: 12/23/2022 Discharge date: 12/25/2022  Time spent: 40 minutes  Recommendations for Outpatient Follow-up:  Get Chem-12 CBC in about 1 week Would recommend titration of blood pressure meds slowly as an outpatient-has multiple intolerances Please refer to ENT as per our below discussions Please refer to cardiology at their suggestion to outpatient cardiology for assessment of low normal EF  Discharge Diagnoses:  MAIN problem for hospitalization   Hypotension secondary to iatrogenic medication usage for hypertension Probable sinusitis chronic Lactic acidosis secondary to hypotension Prediabetes  Please see below for itemized issues addressed in HOpsital- refer to other progress notes for clarity if needed  Discharge Condition: Fair  Diet recommendation: Heart healthy  Filed Weights   12/23/22 2248  Weight: 70.8 kg    History of present illness:    62 year old home dwelling black female CAD MI 2006 PTCA LAD-LHC 06/2017 normal coronaries NSAID UGIB 2009 Poor blood pressure control at baseline Anxiety disorder Prior vasovagal syncope Incontinence + urethropexy in the setting of prior 0   Came to [facility] nausea, dizzy + diaphoresis + ABD pain--- reported to admitting physician that she started taking Cardura 4 mg a month ago and since then has not been feeling well and has been feeling "dizzy" with racing of her heart when she stands as well as nausea/headache Initial blood pressure 80 systolic patient was pale and diaphoretic Per EDP   Workup revealed lab error platelet of 7 later repeated normal BUN/creatinine 26/2.0 (19/1.3) CO2 23 anion gap 8 Troponin gray zone 40 Lactic acid 3.3-->2.3 WBCs 8 hemoglobin 12 platelets 324   CT chest abdomen pelvis no acute aortic findings bilateral nonobstructing nephrolithiasis CXR no active disease CT  head mild white chronic white matter ischemic changes   In ED Rx 2 L saline-EKG = T wave inversions? Bedside US per emergency department showed a small pericardial effusion however CT did not show any findings consistent with this  Hospital Course:  Multifactorial syncope secondary to hypotension from new medication Cardura over the past month [history of vasovagal syncope previously] Patient admitted to not taking meds correctly and may have overtaken or undertaken some meds We obtain orthostatics that were negative several times Discontinue completely Cardura 4 mg Resumed Coreg and blood pressures were still high and resumed at discharge hydralazine 100 twice daily Added on HCTZ on discharge Needs labs in about 1 week  Low-grade fever 99 with lactic acidosis She had a mild white count but had no constitutional symptoms other than her chronic sinus issues-I would hold antibiotics at this stage She should use her usual inhalers that she has been prescribed-I would recommend she continue her Astelin and her mometasone  AKI on admission Secondary to hypotension on arrival lactic acidosis also explained by this Again no high-grade fever Improved to close to baseline levels with IV fluid repletion  [name] zone troponin without chest pain Prior history CAD with PTCA 2006 Echocardiogram EF 40-45% with mild pericardial effusion and no wall motion abnormality Discussed with Dr. Jomarie Longs on telephone 9/27 he feels this should be manageable as an outpatient and suggest outpatient referral via PCP which can be done  Prior GI bleed in 2009-Protonix   Discharge Exam: Vitals:   12/25/22 0531 12/25/22 1023  BP: (!) 191/123 (!) 173/102  Pulse: 82 70  Resp:  14  Temp:  97.6 F (36.4 C)  SpO2: 100% 96%  when to take this reasons to take this   multivitamin with minerals Tabs tablet Take 1 tablet by mouth daily.   nitroGLYCERIN 0.4 MG SL tablet Commonly known as: NITROSTAT Place 1 tablet (0.4 mg total) under the tongue every 5 (five) minutes as needed for chest pain.   traMADol 100 MG 24 hr tablet Commonly known as: ULTRAM-ER Take 1 tablet (100 mg total) by mouth at bedtime.   traMADol 200 MG 24 hr tablet Commonly known as: ULTRAM-ER Take 1 tablet (200 mg total) by mouth every morning.   Veozah 45 MG Tabs Generic drug: Fezolinetant Take 1 tablet (45 mg total) by mouth daily.   VISINE OP Place 1 drop into both eyes daily as needed (dry eyes).       Allergies  Allergen Reactions   Amlodipine     gingival hypertrophy   Clonidine Hydrochloride Other (See Comments)    REACTION: FATIGUE   Codeine Nausea And Vomiting    Unknown reaction  (patient reports taking cough syrup with codeine)   Gabapentin     Too sleepy   Ibuprofen Other (See Comments)    GI bleed   Lovastatin Other (See Comments)    Joint pain   Spironolactone     fatigue      The results of significant diagnostics from this hospitalization (including imaging, microbiology, ancillary and laboratory) are listed below for reference.    Significant Diagnostic Studies: ECHOCARDIOGRAM COMPLETE  Result Date: 12/24/2022    ECHOCARDIOGRAM REPORT   Patient Name:   Hayley Miller Date of Exam: 12/24/2022 Medical Rec #:  [id number]         Height:       64.0 in Accession #:    [id number]        Weight:       156.0 lb Date of Birth:  08-Feb-1961         BSA:          1.760 m Patient Age:    62 years          BP:           162/97 mmHg Patient Gender: F                 HR:           83 bpm. Exam Location:  Inpatient Procedure: 2D Echo, Cardiac Doppler and Color Doppler Indications:    Syncope R55                 Pericardial effusion I31.3                 NSTEMI I21.4  History:        Patient has prior history of Echocardiogram examinations, most                 recent 12/15/2018. CAD and Previous Myocardial Infarction; Risk                 Factors:Former Smoker and Hypertension.  Sonographer:    Darlys Gales Referring Phys: [id number] SUBRINA SUNDIL IMPRESSIONS  1. Left ventricular ejection fraction, by estimation, is 40 to 45%. The left ventricle has mildly decreased function. The left ventricle demonstrates regional wall motion abnormalities (see scoring diagram/findings for description). There is mild left ventricular hypertrophy of the septal segment. Left ventricular diastolic parameters are consistent with Grade I diastolic dysfunction (impaired relaxation). Elevated left ventricular end-diastolic pressure.  2. Right ventricular systolic function is normal. The right ventricular  Physician Discharge Summary  Hayley Miller NWG:[id number] DOB: March 05, 1961 DOA: 12/23/2022  PCP: Tresa Garter, MD  Admit date: 12/23/2022 Discharge date: 12/25/2022  Time spent: 40 minutes  Recommendations for Outpatient Follow-up:  Get Chem-12 CBC in about 1 week Would recommend titration of blood pressure meds slowly as an outpatient-has multiple intolerances Please refer to ENT as per our below discussions Please refer to cardiology at their suggestion to outpatient cardiology for assessment of low normal EF  Discharge Diagnoses:  MAIN problem for hospitalization   Hypotension secondary to iatrogenic medication usage for hypertension Probable sinusitis chronic Lactic acidosis secondary to hypotension Prediabetes  Please see below for itemized issues addressed in HOpsital- refer to other progress notes for clarity if needed  Discharge Condition: Fair  Diet recommendation: Heart healthy  Filed Weights   12/23/22 2248  Weight: 70.8 kg    History of present illness:    62 year old home dwelling black female CAD MI 2006 PTCA LAD-LHC 06/2017 normal coronaries NSAID UGIB 2009 Poor blood pressure control at baseline Anxiety disorder Prior vasovagal syncope Incontinence + urethropexy in the setting of prior 0   Came to [facility] nausea, dizzy + diaphoresis + ABD pain--- reported to admitting physician that she started taking Cardura 4 mg a month ago and since then has not been feeling well and has been feeling "dizzy" with racing of her heart when she stands as well as nausea/headache Initial blood pressure 80 systolic patient was pale and diaphoretic Per EDP   Workup revealed lab error platelet of 7 later repeated normal BUN/creatinine 26/2.0 (19/1.3) CO2 23 anion gap 8 Troponin gray zone 40 Lactic acid 3.3-->2.3 WBCs 8 hemoglobin 12 platelets 324   CT chest abdomen pelvis no acute aortic findings bilateral nonobstructing nephrolithiasis CXR no active disease CT  head mild white chronic white matter ischemic changes   In ED Rx 2 L saline-EKG = T wave inversions? Bedside US per emergency department showed a small pericardial effusion however CT did not show any findings consistent with this  Hospital Course:  Multifactorial syncope secondary to hypotension from new medication Cardura over the past month [history of vasovagal syncope previously] Patient admitted to not taking meds correctly and may have overtaken or undertaken some meds We obtain orthostatics that were negative several times Discontinue completely Cardura 4 mg Resumed Coreg and blood pressures were still high and resumed at discharge hydralazine 100 twice daily Added on HCTZ on discharge Needs labs in about 1 week  Low-grade fever 99 with lactic acidosis She had a mild white count but had no constitutional symptoms other than her chronic sinus issues-I would hold antibiotics at this stage She should use her usual inhalers that she has been prescribed-I would recommend she continue her Astelin and her mometasone  AKI on admission Secondary to hypotension on arrival lactic acidosis also explained by this Again no high-grade fever Improved to close to baseline levels with IV fluid repletion  [name] zone troponin without chest pain Prior history CAD with PTCA 2006 Echocardiogram EF 40-45% with mild pericardial effusion and no wall motion abnormality Discussed with Dr. Jomarie Longs on telephone 9/27 he feels this should be manageable as an outpatient and suggest outpatient referral via PCP which can be done  Prior GI bleed in 2009-Protonix   Discharge Exam: Vitals:   12/25/22 0531 12/25/22 1023  BP: (!) 191/123 (!) 173/102  Pulse: 82 70  Resp:  14  Temp:  97.6 F (36.4 C)  SpO2: 100% 96%  Physician Discharge Summary  Hayley Miller NWG:[id number] DOB: March 05, 1961 DOA: 12/23/2022  PCP: Tresa Garter, MD  Admit date: 12/23/2022 Discharge date: 12/25/2022  Time spent: 40 minutes  Recommendations for Outpatient Follow-up:  Get Chem-12 CBC in about 1 week Would recommend titration of blood pressure meds slowly as an outpatient-has multiple intolerances Please refer to ENT as per our below discussions Please refer to cardiology at their suggestion to outpatient cardiology for assessment of low normal EF  Discharge Diagnoses:  MAIN problem for hospitalization   Hypotension secondary to iatrogenic medication usage for hypertension Probable sinusitis chronic Lactic acidosis secondary to hypotension Prediabetes  Please see below for itemized issues addressed in HOpsital- refer to other progress notes for clarity if needed  Discharge Condition: Fair  Diet recommendation: Heart healthy  Filed Weights   12/23/22 2248  Weight: 70.8 kg    History of present illness:    62 year old home dwelling black female CAD MI 2006 PTCA LAD-LHC 06/2017 normal coronaries NSAID UGIB 2009 Poor blood pressure control at baseline Anxiety disorder Prior vasovagal syncope Incontinence + urethropexy in the setting of prior 0   Came to [facility] nausea, dizzy + diaphoresis + ABD pain--- reported to admitting physician that she started taking Cardura 4 mg a month ago and since then has not been feeling well and has been feeling "dizzy" with racing of her heart when she stands as well as nausea/headache Initial blood pressure 80 systolic patient was pale and diaphoretic Per EDP   Workup revealed lab error platelet of 7 later repeated normal BUN/creatinine 26/2.0 (19/1.3) CO2 23 anion gap 8 Troponin gray zone 40 Lactic acid 3.3-->2.3 WBCs 8 hemoglobin 12 platelets 324   CT chest abdomen pelvis no acute aortic findings bilateral nonobstructing nephrolithiasis CXR no active disease CT  head mild white chronic white matter ischemic changes   In ED Rx 2 L saline-EKG = T wave inversions? Bedside US per emergency department showed a small pericardial effusion however CT did not show any findings consistent with this  Hospital Course:  Multifactorial syncope secondary to hypotension from new medication Cardura over the past month [history of vasovagal syncope previously] Patient admitted to not taking meds correctly and may have overtaken or undertaken some meds We obtain orthostatics that were negative several times Discontinue completely Cardura 4 mg Resumed Coreg and blood pressures were still high and resumed at discharge hydralazine 100 twice daily Added on HCTZ on discharge Needs labs in about 1 week  Low-grade fever 99 with lactic acidosis She had a mild white count but had no constitutional symptoms other than her chronic sinus issues-I would hold antibiotics at this stage She should use her usual inhalers that she has been prescribed-I would recommend she continue her Astelin and her mometasone  AKI on admission Secondary to hypotension on arrival lactic acidosis also explained by this Again no high-grade fever Improved to close to baseline levels with IV fluid repletion  [name] zone troponin without chest pain Prior history CAD with PTCA 2006 Echocardiogram EF 40-45% with mild pericardial effusion and no wall motion abnormality Discussed with Dr. Jomarie Longs on telephone 9/27 he feels this should be manageable as an outpatient and suggest outpatient referral via PCP which can be done  Prior GI bleed in 2009-Protonix   Discharge Exam: Vitals:   12/25/22 0531 12/25/22 1023  BP: (!) 191/123 (!) 173/102  Pulse: 82 70  Resp:  14  Temp:  97.6 F (36.4 C)  SpO2: 100% 96%  when to take this reasons to take this   multivitamin with minerals Tabs tablet Take 1 tablet by mouth daily.   nitroGLYCERIN 0.4 MG SL tablet Commonly known as: NITROSTAT Place 1 tablet (0.4 mg total) under the tongue every 5 (five) minutes as needed for chest pain.   traMADol 100 MG 24 hr tablet Commonly known as: ULTRAM-ER Take 1 tablet (100 mg total) by mouth at bedtime.   traMADol 200 MG 24 hr tablet Commonly known as: ULTRAM-ER Take 1 tablet (200 mg total) by mouth every morning.   Veozah 45 MG Tabs Generic drug: Fezolinetant Take 1 tablet (45 mg total) by mouth daily.   VISINE OP Place 1 drop into both eyes daily as needed (dry eyes).       Allergies  Allergen Reactions   Amlodipine     gingival hypertrophy   Clonidine Hydrochloride Other (See Comments)    REACTION: FATIGUE   Codeine Nausea And Vomiting    Unknown reaction  (patient reports taking cough syrup with codeine)   Gabapentin     Too sleepy   Ibuprofen Other (See Comments)    GI bleed   Lovastatin Other (See Comments)    Joint pain   Spironolactone     fatigue      The results of significant diagnostics from this hospitalization (including imaging, microbiology, ancillary and laboratory) are listed below for reference.    Significant Diagnostic Studies: ECHOCARDIOGRAM COMPLETE  Result Date: 12/24/2022    ECHOCARDIOGRAM REPORT   Patient Name:   Hayley Miller Date of Exam: 12/24/2022 Medical Rec #:  [id number]         Height:       64.0 in Accession #:    [id number]        Weight:       156.0 lb Date of Birth:  08-Feb-1961         BSA:          1.760 m Patient Age:    62 years          BP:           162/97 mmHg Patient Gender: F                 HR:           83 bpm. Exam Location:  Inpatient Procedure: 2D Echo, Cardiac Doppler and Color Doppler Indications:    Syncope R55                 Pericardial effusion I31.3                 NSTEMI I21.4  History:        Patient has prior history of Echocardiogram examinations, most                 recent 12/15/2018. CAD and Previous Myocardial Infarction; Risk                 Factors:Former Smoker and Hypertension.  Sonographer:    Darlys Gales Referring Phys: [id number] SUBRINA SUNDIL IMPRESSIONS  1. Left ventricular ejection fraction, by estimation, is 40 to 45%. The left ventricle has mildly decreased function. The left ventricle demonstrates regional wall motion abnormalities (see scoring diagram/findings for description). There is mild left ventricular hypertrophy of the septal segment. Left ventricular diastolic parameters are consistent with Grade I diastolic dysfunction (impaired relaxation). Elevated left ventricular end-diastolic pressure.  2. Right ventricular systolic function is normal. The right ventricular  when to take this reasons to take this   multivitamin with minerals Tabs tablet Take 1 tablet by mouth daily.   nitroGLYCERIN 0.4 MG SL tablet Commonly known as: NITROSTAT Place 1 tablet (0.4 mg total) under the tongue every 5 (five) minutes as needed for chest pain.   traMADol 100 MG 24 hr tablet Commonly known as: ULTRAM-ER Take 1 tablet (100 mg total) by mouth at bedtime.   traMADol 200 MG 24 hr tablet Commonly known as: ULTRAM-ER Take 1 tablet (200 mg total) by mouth every morning.   Veozah 45 MG Tabs Generic drug: Fezolinetant Take 1 tablet (45 mg total) by mouth daily.   VISINE OP Place 1 drop into both eyes daily as needed (dry eyes).       Allergies  Allergen Reactions   Amlodipine     gingival hypertrophy   Clonidine Hydrochloride Other (See Comments)    REACTION: FATIGUE   Codeine Nausea And Vomiting    Unknown reaction  (patient reports taking cough syrup with codeine)   Gabapentin     Too sleepy   Ibuprofen Other (See Comments)    GI bleed   Lovastatin Other (See Comments)    Joint pain   Spironolactone     fatigue      The results of significant diagnostics from this hospitalization (including imaging, microbiology, ancillary and laboratory) are listed below for reference.    Significant Diagnostic Studies: ECHOCARDIOGRAM COMPLETE  Result Date: 12/24/2022    ECHOCARDIOGRAM REPORT   Patient Name:   Hayley Miller Date of Exam: 12/24/2022 Medical Rec #:  [id number]         Height:       64.0 in Accession #:    [id number]        Weight:       156.0 lb Date of Birth:  08-Feb-1961         BSA:          1.760 m Patient Age:    62 years          BP:           162/97 mmHg Patient Gender: F                 HR:           83 bpm. Exam Location:  Inpatient Procedure: 2D Echo, Cardiac Doppler and Color Doppler Indications:    Syncope R55                 Pericardial effusion I31.3                 NSTEMI I21.4  History:        Patient has prior history of Echocardiogram examinations, most                 recent 12/15/2018. CAD and Previous Myocardial Infarction; Risk                 Factors:Former Smoker and Hypertension.  Sonographer:    Darlys Gales Referring Phys: [id number] SUBRINA SUNDIL IMPRESSIONS  1. Left ventricular ejection fraction, by estimation, is 40 to 45%. The left ventricle has mildly decreased function. The left ventricle demonstrates regional wall motion abnormalities (see scoring diagram/findings for description). There is mild left ventricular hypertrophy of the septal segment. Left ventricular diastolic parameters are consistent with Grade I diastolic dysfunction (impaired relaxation). Elevated left ventricular end-diastolic pressure.  2. Right ventricular systolic function is normal. The right ventricular

## 2023-01-07 NOTE — Progress Notes (Signed)
Cardiology Office Note:  .   Date:  01/11/2023  ID:  Hayley Miller, DOB 01/04/1961, MRN [id number] PCP: Tresa Garter, MD  [facility] Providers Cardiologist:  Dietrich Pates, MD Sleep Medicine:  Armanda Magic, MD  [facility] Cardiologist:  Nanetta Batty, MD    History of Present Illness: .   Hayley Miller is a 62 y.o. female with a hx of ASCHD, fibromyalgia, GERD, hypertension, hyperlipidemia, severe OSA on CPAP.  Patient discharged 12/25/22 with hypotension due to cardura. Was given 2 L IV fluids. ? TWI on EKG and bedside US showed small pericardial effusion but CT didn't show any of this. EF 40-45% with WMA previously 50-55% with WMA 2020.  Patient comes in with her mother. She didn't take her meds today because they make her sleepy and she had to drive. Not using CPAP because it's drying out her sinuses and it never used to do that. She is a little short of breath, no edema. Denies chest pain.   ROS:    Studies Reviewed: [name]         Prior CV Studies:    IMPRESSIONS     1. Left ventricular ejection fraction, by estimation, is 40 to 45%. The  left ventricle has mildly decreased function. The left ventricle  demonstrates regional wall motion abnormalities (see scoring  diagram/findings for description). There is mild left  ventricular hypertrophy of the septal segment. Left ventricular diastolic  parameters are consistent with Grade I diastolic dysfunction (impaired  relaxation). Elevated left ventricular end-diastolic pressure.   2. Right ventricular systolic function is normal. The right ventricular  size is normal. There is normal pulmonary artery systolic pressure.   3. A small pericardial effusion is present. The pericardial effusion is  anterior to the right ventricle. There is no evidence of cardiac  tamponade.   4. The mitral valve is normal in structure. Mild mitral valve  regurgitation. No evidence of mitral stenosis.   5. The aortic valve is normal in  structure. Aortic valve regurgitation is  trivial. No aortic stenosis is present.   6. The inferior vena cava is normal in size with greater than 50%  respiratory variability, suggesting right atrial pressure of 3 mmHg.   FINDINGS   Left Ventricle: Left ventricular ejection fraction, by estimation, is 40  to 45%. The left ventricle has mildly decreased function. The left  ventricle demonstrates regional wall motion abnormalities. The left  ventricular internal cavity size was normal  in size. There is mild left ventricular hypertrophy of the septal segment.  Left ventricular diastolic parameters are consistent with Grade I  diastolic dysfunction (impaired relaxation). Elevated left ventricular  end-diastolic pressure.     LV Wall Scoring:  The entire anterior wall, inferior septum, inferior wall, posterior wall,  and  basal anteroseptal segment are hypokinetic. The antero-lateral wall, mid  anteroseptal segment, apical lateral segment, apical inferior segment, and  apex are normal.   Right Ventricle: The right ventricular size is normal. No increase in  right ventricular wall thickness. Right ventricular systolic function is  normal. There is normal pulmonary artery systolic pressure. The tricuspid  regurgitant velocity is 2.52 m/s, and   with an assumed right atrial pressure of 3 mmHg, the estimated right  ventricular systolic pressure is 28.4 mmHg.   Left Atrium: Left atrial size was normal in size.   Right Atrium: Right atrial size was normal in size.   Pericardium: A small pericardial effusion is present. The pericardial  effusion  is anterior to the right ventricle. There is no evidence of  cardiac tamponade.   Mitral Valve: The mitral valve is normal in structure. Mild mitral valve  regurgitation. No evidence of mitral valve stenosis.   Tricuspid Valve: The tricuspid valve is normal in structure. Tricuspid  valve regurgitation is trivial. No evidence of tricuspid  stenosis.   Aortic Valve: The aortic valve is normal in structure. Aortic valve  regurgitation is trivial. No aortic stenosis is present. Aortic valve mean  gradient measures 4.0 mmHg. Aortic valve peak gradient measures 6.7 mmHg.  Aortic valve area, by VTI measures  1.94 cm.   Pulmonic Valve: The pulmonic valve was normal in structure. Pulmonic valve  regurgitation is not visualized. No evidence of pulmonic stenosis.   Aorta: The aortic root is normal in size and structure.   Venous: The inferior vena cava is normal in size with greater than 50%  respiratory variability, suggesting right atrial pressure of 3 mmHg.   IAS/Shunts: No atrial level shunt detected by color flow Doppler.      Risk Assessment/Calculations:     HYPERTENSION CONTROL Vitals:   01/11/23 0816 01/11/23 0841  BP: (!) 200/98 (!) 170/100    The patient's blood pressure is elevated above target today.  In order to address the patient's elevated BP: Blood pressure will be monitored at home to determine if medication changes need to be made.; The blood pressure is usually elevated in clinic.  Blood pressures monitored at home have been optimal.; Follow up with general cardiology has been recommended.      STOP-Bang Score:         Physical Exam:   VS:  BP (!) 170/100   Pulse 63   Ht 5\' 4"  (1.626 m)   Wt 154 lb 12.8 oz (70.2 kg)   LMP 06/07/2012   SpO2 97%   BMI 26.57 kg/m    Wt Readings from Last 3 Encounters:  01/11/23 154 lb 12.8 oz (70.2 kg)  12/23/22 156 lb (70.8 kg)  12/23/22 156 lb (70.8 kg)    GEN: Well nourished, well developed in no acute distress NECK: No JVD; No carotid bruits CARDIAC:  RRR, no murmurs, rubs, gallops RESPIRATORY:  Clear to auscultation without rales, wheezing or rhonchi  ABDOMEN: Soft, non-tender, non-distended EXTREMITIES:  No edema; No deformity   ASSESSMENT AND PLAN: .    Hypotension due to Cardura with AKI -Crt 1.37 12/25/22. Patient now hypertensive and hasn't  taken any meds today. I asked her to take her meds and keep track of BP for 2 weeks and come back for f/u.  HTN and sinus tachycardia because she hasn't taken her meds. Also not using CPAP-Nina is speaking with her.  New LVD on echo 11/2022-no evidence of CHF on exam today but does have some dyspnea. Check labs today and bring her back in 2 weeks. If BP controlled will order NST at that time.  CAD MI 2006 PTCA LAD, LHC 2011 no sig CAD, low risk NST 2015  HLD  Severe OSA on CPAP-not using because of sinus trouble. Coralee North is working with her.        Dispo: f/u in 2 weeks.  Signed, Jacolyn Reedy, PA-C

## 2023-01-11 ENCOUNTER — Ambulatory Visit: Payer: Medicare Other | Attending: Physician Assistant | Admitting: Physician Assistant

## 2023-01-11 ENCOUNTER — Encounter: Payer: Self-pay | Admitting: Physician Assistant

## 2023-01-11 VITALS — BP 170/100 | HR 63 | Ht 64.0 in | Wt 154.8 lb

## 2023-01-11 DIAGNOSIS — I428 Other cardiomyopathies: Secondary | ICD-10-CM | POA: Diagnosis not present

## 2023-01-11 DIAGNOSIS — I10 Essential (primary) hypertension: Secondary | ICD-10-CM

## 2023-01-11 DIAGNOSIS — I2510 Atherosclerotic heart disease of native coronary artery without angina pectoris: Secondary | ICD-10-CM | POA: Diagnosis not present

## 2023-01-11 DIAGNOSIS — I952 Hypotension due to drugs: Secondary | ICD-10-CM

## 2023-01-11 DIAGNOSIS — E782 Mixed hyperlipidemia: Secondary | ICD-10-CM

## 2023-01-11 DIAGNOSIS — G4733 Obstructive sleep apnea (adult) (pediatric): Secondary | ICD-10-CM

## 2023-01-11 NOTE — Patient Instructions (Signed)
Medication Instructions:  Please go home and take your blood pressure medication as prescribed.  Make sure you are taking your blood pressure medicine every Sterkel and checking your blood pressure 2 hours after taking medication. Keep a log of your blood pressures and send [place] a copy through Mychart.   *If you need a refill on your cardiac medications before your next appointment, please call your pharmacy*   Lab Work: CMET, Pro BNP- Today   If you have labs (blood work) drawn today and your tests are completely normal, you will receive your results only by: MyChart Message (if you have MyChart) OR A paper copy in the mail If you have any lab test that is abnormal or we need to change your treatment, we will call you to review the results.   Testing/Procedures: None ordered    Follow-Up: At [facility], you and your health needs are our priority.  As part of our continuing mission to provide you with exceptional heart care, we have created designated Provider Care Teams.  These Care Teams include your primary Cardiologist (physician) and Advanced Practice Providers (APPs -  Physician Assistants and Nurse Practitioners) who all work together to provide you with the care you need, when you need it.  We recommend signing up for the patient portal called "MyChart".  Sign up information is provided on this After Visit Summary.  MyChart is used to connect with patients for Virtual Visits (Telemedicine).  Patients are able to view lab/test results, encounter notes, upcoming appointments, etc.  Non-urgent messages can be sent to your provider as well.   To learn more about what you can do with MyChart, go to [email or web address].    Your next appointment:   2 week(s)  Provider:   Dietrich Pates, MD   or APP   Other Instructions

## 2023-01-12 LAB — COMPREHENSIVE METABOLIC PANEL
ALT: 20 [IU]/L (ref 0–32)
AST: 19 [IU]/L (ref 0–40)
Albumin: 4.8 g/dL (ref 3.8–4.9)
Alkaline Phosphatase: 106 [IU]/L (ref 44–121)
BUN/Creatinine Ratio: 9 — ABNORMAL LOW (ref 12–28)
BUN: 13 mg/dL (ref 8–27)
Bilirubin Total: 0.3 mg/dL (ref 0.0–1.2)
CO2: 24 mmol/L (ref 20–29)
Calcium: 11.2 mg/dL — ABNORMAL HIGH (ref 8.7–10.3)
Chloride: 102 mmol/L (ref 96–106)
Creatinine, Ser: 1.49 mg/dL — ABNORMAL HIGH (ref 0.57–1.00)
Globulin, Total: 3.2 g/dL (ref 1.5–4.5)
Glucose: 107 mg/dL — ABNORMAL HIGH (ref 70–99)
Potassium: 3.9 mmol/L (ref 3.5–5.2)
Sodium: 143 mmol/L (ref 134–144)
Total Protein: 8 g/dL (ref 6.0–8.5)
eGFR: 40 mL/min/{1.73_m2} — ABNORMAL LOW (ref >59)

## 2023-01-12 LAB — PRO B NATRIURETIC PEPTIDE: NT-Pro BNP: 258 pg/mL (ref 0–287)

## 2023-01-13 ENCOUNTER — Encounter: Payer: Self-pay | Admitting: Internal Medicine

## 2023-01-13 ENCOUNTER — Ambulatory Visit: Payer: Medicare Other | Admitting: Internal Medicine

## 2023-01-13 VITALS — BP 138/110 | HR 84 | Temp 98.0°F | Ht 64.0 in | Wt 158.4 lb

## 2023-01-13 DIAGNOSIS — Z8679 Personal history of other diseases of the circulatory system: Secondary | ICD-10-CM | POA: Diagnosis not present

## 2023-01-13 DIAGNOSIS — E872 Acidosis, unspecified: Secondary | ICD-10-CM

## 2023-01-13 DIAGNOSIS — R55 Syncope and collapse: Secondary | ICD-10-CM

## 2023-01-13 DIAGNOSIS — N184 Chronic kidney disease, stage 4 (severe): Secondary | ICD-10-CM | POA: Diagnosis not present

## 2023-01-13 DIAGNOSIS — I2510 Atherosclerotic heart disease of native coronary artery without angina pectoris: Secondary | ICD-10-CM

## 2023-01-13 DIAGNOSIS — G894 Chronic pain syndrome: Secondary | ICD-10-CM | POA: Diagnosis not present

## 2023-01-13 DIAGNOSIS — R29898 Other symptoms and signs involving the musculoskeletal system: Secondary | ICD-10-CM

## 2023-01-13 NOTE — Assessment & Plan Note (Signed)
We will monitor GFR. Hydrate well

## 2023-01-13 NOTE — Assessment & Plan Note (Signed)
LBP On Tramadol XR

## 2023-01-13 NOTE — Assessment & Plan Note (Signed)
Chronic Dr Tenny Craw ASA, NTG prn, Pravachol Deconditioning - will try Cardiac Rehab

## 2023-01-13 NOTE — Assessment & Plan Note (Signed)
Resolved

## 2023-01-13 NOTE — Assessment & Plan Note (Signed)
No angina 

## 2023-01-13 NOTE — Assessment & Plan Note (Signed)
Multifactorial syncope secondary to hypotension from new medication Cardura over the past month [history of vasovagal syncope previously] Patient admitted to not taking meds correctly and may have overtaken or undertaken some meds No relapse since d/c Feeling better

## 2023-01-13 NOTE — Progress Notes (Signed)
12/24/2022    ECHOCARDIOGRAM REPORT   Patient Name:   Hayley Miller Date of Exam: 12/24/2022 Medical Rec #:  [id number]         Height:       64.0 in Accession #:    [id number]        Weight:        156.0 lb Date of Birth:  06/21/1960         BSA:          1.760 m Patient Age:    62 years          BP:           162/97 mmHg Patient Gender: F                 HR:           83 bpm. Exam Location:  Inpatient Procedure: 2D Echo, Cardiac Doppler and Color Doppler Indications:    Syncope R55                 Pericardial effusion I31.3                 NSTEMI I21.4  History:        Patient has prior history of Echocardiogram examinations, most                 recent 12/15/2018. CAD and Previous Myocardial Infarction; Risk                 Factors:Former Smoker and Hypertension.  Sonographer:    Darlys Gales Referring Phys: [id number] SUBRINA SUNDIL IMPRESSIONS  1. Left ventricular ejection fraction, by estimation, is 40 to 45%. The left ventricle has mildly decreased function. The left ventricle demonstrates regional wall motion abnormalities (see scoring diagram/findings for description). There is mild left ventricular hypertrophy of the septal segment. Left ventricular diastolic parameters are consistent with Grade I diastolic dysfunction (impaired relaxation). Elevated left ventricular end-diastolic pressure.  2. Right ventricular systolic function is normal. The right ventricular size is normal. There is normal pulmonary artery systolic pressure.  3. A small pericardial effusion is present. The pericardial effusion is anterior to the right ventricle. There is no evidence of cardiac tamponade.  4. The mitral valve is normal in structure. Mild mitral valve regurgitation. No evidence of mitral stenosis.  5. The aortic valve is normal in structure. Aortic valve regurgitation is trivial. No aortic stenosis is present.  6. The inferior vena cava is normal in size with greater than 50% respiratory variability, suggesting right atrial pressure of 3 mmHg. FINDINGS  Left Ventricle: Left ventricular ejection fraction, by estimation, is 40 to 45%. The left ventricle has mildly decreased function. The left ventricle demonstrates  regional wall motion abnormalities. The left ventricular internal cavity size was normal in size. There is mild left ventricular hypertrophy of the septal segment. Left ventricular diastolic parameters are consistent with Grade I diastolic dysfunction (impaired relaxation). Elevated left ventricular end-diastolic pressure.  LV Wall Scoring: The entire anterior wall, inferior septum, inferior wall, posterior wall, and basal anteroseptal segment are hypokinetic. The antero-lateral wall, mid anteroseptal segment, apical lateral segment, apical inferior segment, and apex are normal. Right Ventricle: The right ventricular size is normal. No increase in right ventricular wall thickness. Right ventricular systolic function is normal. There is normal pulmonary artery systolic pressure. The tricuspid regurgitant velocity is 2.52 m/s, and  with an assumed right atrial pressure of 3 mmHg, the estimated right ventricular systolic pressure  12/24/2022    ECHOCARDIOGRAM REPORT   Patient Name:   Hayley Miller Date of Exam: 12/24/2022 Medical Rec #:  [id number]         Height:       64.0 in Accession #:    [id number]        Weight:        156.0 lb Date of Birth:  06/21/1960         BSA:          1.760 m Patient Age:    62 years          BP:           162/97 mmHg Patient Gender: F                 HR:           83 bpm. Exam Location:  Inpatient Procedure: 2D Echo, Cardiac Doppler and Color Doppler Indications:    Syncope R55                 Pericardial effusion I31.3                 NSTEMI I21.4  History:        Patient has prior history of Echocardiogram examinations, most                 recent 12/15/2018. CAD and Previous Myocardial Infarction; Risk                 Factors:Former Smoker and Hypertension.  Sonographer:    Darlys Gales Referring Phys: [id number] SUBRINA SUNDIL IMPRESSIONS  1. Left ventricular ejection fraction, by estimation, is 40 to 45%. The left ventricle has mildly decreased function. The left ventricle demonstrates regional wall motion abnormalities (see scoring diagram/findings for description). There is mild left ventricular hypertrophy of the septal segment. Left ventricular diastolic parameters are consistent with Grade I diastolic dysfunction (impaired relaxation). Elevated left ventricular end-diastolic pressure.  2. Right ventricular systolic function is normal. The right ventricular size is normal. There is normal pulmonary artery systolic pressure.  3. A small pericardial effusion is present. The pericardial effusion is anterior to the right ventricle. There is no evidence of cardiac tamponade.  4. The mitral valve is normal in structure. Mild mitral valve regurgitation. No evidence of mitral stenosis.  5. The aortic valve is normal in structure. Aortic valve regurgitation is trivial. No aortic stenosis is present.  6. The inferior vena cava is normal in size with greater than 50% respiratory variability, suggesting right atrial pressure of 3 mmHg. FINDINGS  Left Ventricle: Left ventricular ejection fraction, by estimation, is 40 to 45%. The left ventricle has mildly decreased function. The left ventricle demonstrates  regional wall motion abnormalities. The left ventricular internal cavity size was normal in size. There is mild left ventricular hypertrophy of the septal segment. Left ventricular diastolic parameters are consistent with Grade I diastolic dysfunction (impaired relaxation). Elevated left ventricular end-diastolic pressure.  LV Wall Scoring: The entire anterior wall, inferior septum, inferior wall, posterior wall, and basal anteroseptal segment are hypokinetic. The antero-lateral wall, mid anteroseptal segment, apical lateral segment, apical inferior segment, and apex are normal. Right Ventricle: The right ventricular size is normal. No increase in right ventricular wall thickness. Right ventricular systolic function is normal. There is normal pulmonary artery systolic pressure. The tricuspid regurgitant velocity is 2.52 m/s, and  with an assumed right atrial pressure of 3 mmHg, the estimated right ventricular systolic pressure  12/24/2022    ECHOCARDIOGRAM REPORT   Patient Name:   Hayley Miller Date of Exam: 12/24/2022 Medical Rec #:  [id number]         Height:       64.0 in Accession #:    [id number]        Weight:        156.0 lb Date of Birth:  06/21/1960         BSA:          1.760 m Patient Age:    62 years          BP:           162/97 mmHg Patient Gender: F                 HR:           83 bpm. Exam Location:  Inpatient Procedure: 2D Echo, Cardiac Doppler and Color Doppler Indications:    Syncope R55                 Pericardial effusion I31.3                 NSTEMI I21.4  History:        Patient has prior history of Echocardiogram examinations, most                 recent 12/15/2018. CAD and Previous Myocardial Infarction; Risk                 Factors:Former Smoker and Hypertension.  Sonographer:    Darlys Gales Referring Phys: [id number] SUBRINA SUNDIL IMPRESSIONS  1. Left ventricular ejection fraction, by estimation, is 40 to 45%. The left ventricle has mildly decreased function. The left ventricle demonstrates regional wall motion abnormalities (see scoring diagram/findings for description). There is mild left ventricular hypertrophy of the septal segment. Left ventricular diastolic parameters are consistent with Grade I diastolic dysfunction (impaired relaxation). Elevated left ventricular end-diastolic pressure.  2. Right ventricular systolic function is normal. The right ventricular size is normal. There is normal pulmonary artery systolic pressure.  3. A small pericardial effusion is present. The pericardial effusion is anterior to the right ventricle. There is no evidence of cardiac tamponade.  4. The mitral valve is normal in structure. Mild mitral valve regurgitation. No evidence of mitral stenosis.  5. The aortic valve is normal in structure. Aortic valve regurgitation is trivial. No aortic stenosis is present.  6. The inferior vena cava is normal in size with greater than 50% respiratory variability, suggesting right atrial pressure of 3 mmHg. FINDINGS  Left Ventricle: Left ventricular ejection fraction, by estimation, is 40 to 45%. The left ventricle has mildly decreased function. The left ventricle demonstrates  regional wall motion abnormalities. The left ventricular internal cavity size was normal in size. There is mild left ventricular hypertrophy of the septal segment. Left ventricular diastolic parameters are consistent with Grade I diastolic dysfunction (impaired relaxation). Elevated left ventricular end-diastolic pressure.  LV Wall Scoring: The entire anterior wall, inferior septum, inferior wall, posterior wall, and basal anteroseptal segment are hypokinetic. The antero-lateral wall, mid anteroseptal segment, apical lateral segment, apical inferior segment, and apex are normal. Right Ventricle: The right ventricular size is normal. No increase in right ventricular wall thickness. Right ventricular systolic function is normal. There is normal pulmonary artery systolic pressure. The tricuspid regurgitant velocity is 2.52 m/s, and  with an assumed right atrial pressure of 3 mmHg, the estimated right ventricular systolic pressure  Subjective:  Patient ID: Hayley Miller, female    DOB: 22-Sep-1960  Age: 62 y.o. MRN: [id number]  CC: Hospitalization Follow-up [facility] follow up for visit on 09/26)   HPI Jilliam Bellmore Harden presents for post-hospital f/u F/u on syncope. She is here w/her mom....  Per hx: " Admit date: 12/23/2022 Discharge date: 12/25/2022   Time spent: 40 minutes   Recommendations for Outpatient Follow-up:  Get Chem-12 CBC in about 1 week Would recommend titration of blood pressure meds slowly as an outpatient-has multiple intolerances Please refer to ENT as per our below discussions Please refer to cardiology at their suggestion to outpatient cardiology for assessment of low normal EF   Discharge Diagnoses:  MAIN problem for hospitalization    Hypotension secondary to iatrogenic medication usage for hypertension Probable sinusitis chronic Lactic acidosis secondary to hypotension Prediabetes   Please see below for itemized issues addressed in HOpsital- refer to other progress notes for clarity if needed   Discharge Condition: Fair   Diet recommendation: Heart healthy      Filed Weights    12/23/22 2248  Weight: 70.8 kg      History of present illness:    62 year old home dwelling black female CAD MI 2006 PTCA LAD-LHC 06/2017 normal coronaries NSAID UGIB 2009 Poor blood pressure control at baseline Anxiety disorder Prior vasovagal syncope Incontinence + urethropexy in the setting of prior 0   Came to [facility] ED nausea, dizzy + diaphoresis + ABD pain--- reported to admitting physician that she started taking Cardura 4 mg a month ago and since then has not been feeling well and has been feeling "dizzy" with racing of her heart when she stands as well as nausea/headache Initial blood pressure 80 systolic patient was pale and diaphoretic Per EDP   Workup revealed lab error platelet of 7 later repeated normal BUN/creatinine 26/2.0 (19/1.3) CO2 23 anion gap 8 Troponin gray zone  40 Lactic acid 3.3-->2.3 WBCs 8 hemoglobin 12 platelets 324   CT chest abdomen pelvis no acute aortic findings bilateral nonobstructing nephrolithiasis CXR no active disease CT head mild white chronic white matter ischemic changes   In ED Rx 2 L saline-EKG = T wave inversions? Bedside US per emergency department showed a small pericardial effusion however CT did not show any findings consistent with this   Hospital Course:  Multifactorial syncope secondary to hypotension from new medication Cardura over the past month [history of vasovagal syncope previously] Patient admitted to not taking meds correctly and may have overtaken or undertaken some meds We obtain orthostatics that were negative several times Discontinue completely Cardura 4 mg Resumed Coreg and blood pressures were still high and resumed at discharge hydralazine 100 twice daily Added on HCTZ on discharge Needs labs in about 1 week   Low-grade fever 99 with lactic acidosis She had a mild white count but had no constitutional symptoms other than her chronic sinus issues-I would hold antibiotics at this stage She should use her usual inhalers that she has been prescribed-I would recommend she continue her Astelin and her mometasone   AKI on admission Secondary to hypotension on arrival lactic acidosis also explained by this Again no high-grade fever Improved to close to baseline levels with IV fluid repletion   [name] zone troponin without chest pain Prior history CAD with PTCA 2006 Echocardiogram EF 40-45% with mild pericardial effusion and no wall motion abnormality Discussed with Dr. Jomarie Longs on telephone 9/27 he feels this should be manageable as an outpatient and suggest  12/24/2022    ECHOCARDIOGRAM REPORT   Patient Name:   Hayley Miller Date of Exam: 12/24/2022 Medical Rec #:  [id number]         Height:       64.0 in Accession #:    [id number]        Weight:        156.0 lb Date of Birth:  06/21/1960         BSA:          1.760 m Patient Age:    62 years          BP:           162/97 mmHg Patient Gender: F                 HR:           83 bpm. Exam Location:  Inpatient Procedure: 2D Echo, Cardiac Doppler and Color Doppler Indications:    Syncope R55                 Pericardial effusion I31.3                 NSTEMI I21.4  History:        Patient has prior history of Echocardiogram examinations, most                 recent 12/15/2018. CAD and Previous Myocardial Infarction; Risk                 Factors:Former Smoker and Hypertension.  Sonographer:    Darlys Gales Referring Phys: [id number] SUBRINA SUNDIL IMPRESSIONS  1. Left ventricular ejection fraction, by estimation, is 40 to 45%. The left ventricle has mildly decreased function. The left ventricle demonstrates regional wall motion abnormalities (see scoring diagram/findings for description). There is mild left ventricular hypertrophy of the septal segment. Left ventricular diastolic parameters are consistent with Grade I diastolic dysfunction (impaired relaxation). Elevated left ventricular end-diastolic pressure.  2. Right ventricular systolic function is normal. The right ventricular size is normal. There is normal pulmonary artery systolic pressure.  3. A small pericardial effusion is present. The pericardial effusion is anterior to the right ventricle. There is no evidence of cardiac tamponade.  4. The mitral valve is normal in structure. Mild mitral valve regurgitation. No evidence of mitral stenosis.  5. The aortic valve is normal in structure. Aortic valve regurgitation is trivial. No aortic stenosis is present.  6. The inferior vena cava is normal in size with greater than 50% respiratory variability, suggesting right atrial pressure of 3 mmHg. FINDINGS  Left Ventricle: Left ventricular ejection fraction, by estimation, is 40 to 45%. The left ventricle has mildly decreased function. The left ventricle demonstrates  regional wall motion abnormalities. The left ventricular internal cavity size was normal in size. There is mild left ventricular hypertrophy of the septal segment. Left ventricular diastolic parameters are consistent with Grade I diastolic dysfunction (impaired relaxation). Elevated left ventricular end-diastolic pressure.  LV Wall Scoring: The entire anterior wall, inferior septum, inferior wall, posterior wall, and basal anteroseptal segment are hypokinetic. The antero-lateral wall, mid anteroseptal segment, apical lateral segment, apical inferior segment, and apex are normal. Right Ventricle: The right ventricular size is normal. No increase in right ventricular wall thickness. Right ventricular systolic function is normal. There is normal pulmonary artery systolic pressure. The tricuspid regurgitant velocity is 2.52 m/s, and  with an assumed right atrial pressure of 3 mmHg, the estimated right ventricular systolic pressure  Subjective:  Patient ID: Hayley Miller, female    DOB: 22-Sep-1960  Age: 62 y.o. MRN: [id number]  CC: Hospitalization Follow-up [facility] follow up for visit on 09/26)   HPI Jilliam Bellmore Harden presents for post-hospital f/u F/u on syncope. She is here w/her mom....  Per hx: " Admit date: 12/23/2022 Discharge date: 12/25/2022   Time spent: 40 minutes   Recommendations for Outpatient Follow-up:  Get Chem-12 CBC in about 1 week Would recommend titration of blood pressure meds slowly as an outpatient-has multiple intolerances Please refer to ENT as per our below discussions Please refer to cardiology at their suggestion to outpatient cardiology for assessment of low normal EF   Discharge Diagnoses:  MAIN problem for hospitalization    Hypotension secondary to iatrogenic medication usage for hypertension Probable sinusitis chronic Lactic acidosis secondary to hypotension Prediabetes   Please see below for itemized issues addressed in HOpsital- refer to other progress notes for clarity if needed   Discharge Condition: Fair   Diet recommendation: Heart healthy      Filed Weights    12/23/22 2248  Weight: 70.8 kg      History of present illness:    62 year old home dwelling black female CAD MI 2006 PTCA LAD-LHC 06/2017 normal coronaries NSAID UGIB 2009 Poor blood pressure control at baseline Anxiety disorder Prior vasovagal syncope Incontinence + urethropexy in the setting of prior 0   Came to [facility] ED nausea, dizzy + diaphoresis + ABD pain--- reported to admitting physician that she started taking Cardura 4 mg a month ago and since then has not been feeling well and has been feeling "dizzy" with racing of her heart when she stands as well as nausea/headache Initial blood pressure 80 systolic patient was pale and diaphoretic Per EDP   Workup revealed lab error platelet of 7 later repeated normal BUN/creatinine 26/2.0 (19/1.3) CO2 23 anion gap 8 Troponin gray zone  40 Lactic acid 3.3-->2.3 WBCs 8 hemoglobin 12 platelets 324   CT chest abdomen pelvis no acute aortic findings bilateral nonobstructing nephrolithiasis CXR no active disease CT head mild white chronic white matter ischemic changes   In ED Rx 2 L saline-EKG = T wave inversions? Bedside US per emergency department showed a small pericardial effusion however CT did not show any findings consistent with this   Hospital Course:  Multifactorial syncope secondary to hypotension from new medication Cardura over the past month [history of vasovagal syncope previously] Patient admitted to not taking meds correctly and may have overtaken or undertaken some meds We obtain orthostatics that were negative several times Discontinue completely Cardura 4 mg Resumed Coreg and blood pressures were still high and resumed at discharge hydralazine 100 twice daily Added on HCTZ on discharge Needs labs in about 1 week   Low-grade fever 99 with lactic acidosis She had a mild white count but had no constitutional symptoms other than her chronic sinus issues-I would hold antibiotics at this stage She should use her usual inhalers that she has been prescribed-I would recommend she continue her Astelin and her mometasone   AKI on admission Secondary to hypotension on arrival lactic acidosis also explained by this Again no high-grade fever Improved to close to baseline levels with IV fluid repletion   [name] zone troponin without chest pain Prior history CAD with PTCA 2006 Echocardiogram EF 40-45% with mild pericardial effusion and no wall motion abnormality Discussed with Dr. Jomarie Longs on telephone 9/27 he feels this should be manageable as an outpatient and suggest  12/24/2022    ECHOCARDIOGRAM REPORT   Patient Name:   Hayley Miller Date of Exam: 12/24/2022 Medical Rec #:  [id number]         Height:       64.0 in Accession #:    [id number]        Weight:        156.0 lb Date of Birth:  06/21/1960         BSA:          1.760 m Patient Age:    62 years          BP:           162/97 mmHg Patient Gender: F                 HR:           83 bpm. Exam Location:  Inpatient Procedure: 2D Echo, Cardiac Doppler and Color Doppler Indications:    Syncope R55                 Pericardial effusion I31.3                 NSTEMI I21.4  History:        Patient has prior history of Echocardiogram examinations, most                 recent 12/15/2018. CAD and Previous Myocardial Infarction; Risk                 Factors:Former Smoker and Hypertension.  Sonographer:    Darlys Gales Referring Phys: [id number] SUBRINA SUNDIL IMPRESSIONS  1. Left ventricular ejection fraction, by estimation, is 40 to 45%. The left ventricle has mildly decreased function. The left ventricle demonstrates regional wall motion abnormalities (see scoring diagram/findings for description). There is mild left ventricular hypertrophy of the septal segment. Left ventricular diastolic parameters are consistent with Grade I diastolic dysfunction (impaired relaxation). Elevated left ventricular end-diastolic pressure.  2. Right ventricular systolic function is normal. The right ventricular size is normal. There is normal pulmonary artery systolic pressure.  3. A small pericardial effusion is present. The pericardial effusion is anterior to the right ventricle. There is no evidence of cardiac tamponade.  4. The mitral valve is normal in structure. Mild mitral valve regurgitation. No evidence of mitral stenosis.  5. The aortic valve is normal in structure. Aortic valve regurgitation is trivial. No aortic stenosis is present.  6. The inferior vena cava is normal in size with greater than 50% respiratory variability, suggesting right atrial pressure of 3 mmHg. FINDINGS  Left Ventricle: Left ventricular ejection fraction, by estimation, is 40 to 45%. The left ventricle has mildly decreased function. The left ventricle demonstrates  regional wall motion abnormalities. The left ventricular internal cavity size was normal in size. There is mild left ventricular hypertrophy of the septal segment. Left ventricular diastolic parameters are consistent with Grade I diastolic dysfunction (impaired relaxation). Elevated left ventricular end-diastolic pressure.  LV Wall Scoring: The entire anterior wall, inferior septum, inferior wall, posterior wall, and basal anteroseptal segment are hypokinetic. The antero-lateral wall, mid anteroseptal segment, apical lateral segment, apical inferior segment, and apex are normal. Right Ventricle: The right ventricular size is normal. No increase in right ventricular wall thickness. Right ventricular systolic function is normal. There is normal pulmonary artery systolic pressure. The tricuspid regurgitant velocity is 2.52 m/s, and  with an assumed right atrial pressure of 3 mmHg, the estimated right ventricular systolic pressure

## 2023-01-23 DIAGNOSIS — G4733 Obstructive sleep apnea (adult) (pediatric): Secondary | ICD-10-CM | POA: Diagnosis not present

## 2023-01-27 ENCOUNTER — Ambulatory Visit: Payer: Medicare Other | Admitting: Physician Assistant

## 2023-02-21 DIAGNOSIS — G4733 Obstructive sleep apnea (adult) (pediatric): Secondary | ICD-10-CM | POA: Diagnosis not present

## 2023-02-26 DIAGNOSIS — G4733 Obstructive sleep apnea (adult) (pediatric): Secondary | ICD-10-CM | POA: Diagnosis not present

## 2023-03-03 DIAGNOSIS — K080 Exfoliation of teeth due to systemic causes: Secondary | ICD-10-CM | POA: Diagnosis not present

## 2023-03-17 ENCOUNTER — Other Ambulatory Visit: Payer: Self-pay | Admitting: Internal Medicine

## 2023-03-23 DIAGNOSIS — G4733 Obstructive sleep apnea (adult) (pediatric): Secondary | ICD-10-CM | POA: Diagnosis not present

## 2023-04-14 ENCOUNTER — Encounter: Payer: Self-pay | Admitting: Internal Medicine

## 2023-04-14 ENCOUNTER — Ambulatory Visit: Payer: Medicare Other | Admitting: Internal Medicine

## 2023-04-14 VITALS — BP 130/78 | HR 111 | Temp 98.2°F | Ht 64.0 in | Wt 155.0 lb

## 2023-04-14 DIAGNOSIS — N184 Chronic kidney disease, stage 4 (severe): Secondary | ICD-10-CM | POA: Diagnosis not present

## 2023-04-14 DIAGNOSIS — N951 Menopausal and female climacteric states: Secondary | ICD-10-CM

## 2023-04-14 DIAGNOSIS — G894 Chronic pain syndrome: Secondary | ICD-10-CM

## 2023-04-14 DIAGNOSIS — I2510 Atherosclerotic heart disease of native coronary artery without angina pectoris: Secondary | ICD-10-CM | POA: Diagnosis not present

## 2023-04-14 MED ORDER — ESTRADIOL 0.5 MG PO TABS: 0.5000 mg | ORAL_TABLET | Freq: Every day | ORAL | 3 refills | Status: DC

## 2023-04-14 MED ORDER — TRAMADOL HCL ER 100 MG PO TB24: 100.0000 mg | ORAL_TABLET | Freq: Every day | ORAL | 2 refills | Status: DC

## 2023-04-14 MED ORDER — TRAMADOL HCL ER 200 MG PO TB24: 200.0000 mg | ORAL_TABLET | Freq: Every morning | ORAL | 3 refills | Status: DC

## 2023-04-14 NOTE — Assessment & Plan Note (Signed)
C/o severe hot flashes q 20-30 min, bad at night x weeks. Rx was stopped due to high BP. No h/o DVT. Nonsmoker. Start Estrace po. Pt declined a patch.  Potential benefits of a long term HRT use as well as potential risks  and complications were explained to the patient and were aknowledged.

## 2023-04-14 NOTE — Assessment & Plan Note (Signed)
LBP On Tramadol XR  Potential benefits of a long term opioids use as well as potential risks (i.e. addiction risk, apnea etc) and complications (i.e. Somnolence, constipation and others) were explained to the patient and were aknowledged.

## 2023-04-14 NOTE — Progress Notes (Signed)
Subjective:  Patient ID: Hayley Miller, female    DOB: 1960/09/02  Age: 63 y.o. MRN: [id number]  CC: Medical Management of Chronic Issues (3 month follow up)   HPI Hayley Miller presents for LBP C/o severe hot flashes q 20-30 min, bad at night x weeks. Rx was stopped due to high BP S/p complete hysterectomy  Outpatient Medications Prior to Visit  Medication Sig Dispense Refill   acetaminophen (TYLENOL) 500 MG tablet Take 500 mg by mouth every 6 (six) hours as needed.     albuterol (VENTOLIN HFA) 108 (90 Base) MCG/ACT inhaler Inhale 2 puffs into the lungs every 4 (four) hours as needed for wheezing or shortness of breath. 8 g 5   aspirin EC 81 MG tablet Take 81 mg by mouth daily.     azelastine (ASTELIN) 0.1 % nasal spray Place 1 spray into both nostrils 2 (two) times daily. Use in each nostril as directed 30 mL 5   carvedilol (COREG) 25 MG tablet Take 1 tablet (25 mg total) by mouth 2 (two) times daily. 60 tablet 11   cholecalciferol (VITAMIN D3) 25 MCG (1000 UNIT) tablet Take 1,000 Units by mouth daily.     cyclobenzaprine (FLEXERIL) 5 MG tablet Take 1 tablet (5 mg total) by mouth at bedtime. 30 tablet 2   esomeprazole (NEXIUM) 40 MG capsule Take 1 capsule (40 mg total) by mouth daily. 90 capsule 1   Fezolinetant (VEOZAH) 45 MG TABS Take 1 tablet (45 mg total) by mouth daily. 30 tablet 5   hydrALAZINE (APRESOLINE) 100 MG tablet Take 1 tablet (100 mg total) by mouth 2 (two) times daily. 60 tablet 5   hydrochlorothiazide (HYDRODIURIL) 25 MG tablet Take 1 tablet (25 mg total) by mouth daily. 30 tablet 2   Multiple Vitamin (MULTIVITAMIN WITH MINERALS) TABS tablet Take 1 tablet by mouth daily.     nitroGLYCERIN (NITROSTAT) 0.4 MG SL tablet Place 1 tablet (0.4 mg total) under the tongue every 5 (five) minutes as needed for chest pain. 20 tablet 2   Tetrahydrozoline HCl (VISINE OP) Place 1 drop into both eyes daily as needed (dry eyes).     traMADol (ULTRAM-ER) 100 MG 24 hr tablet Take  1 tablet (100 mg total) by mouth at bedtime. 30 tablet 2   traMADol (ULTRAM-ER) 200 MG 24 hr tablet Take 1 tablet (200 mg total) by mouth every morning. 30 tablet 3   No facility-administered medications prior to visit.    ROS: Review of Systems  Objective:  BP 130/78 (BP Location: Right Arm, Patient Position: Sitting, Cuff Size: Normal)   Pulse (!) 111   Temp 98.2 F (36.8 C) (Oral)   Ht 5\' 4"  (1.626 m)   Wt 155 lb (70.3 kg)   LMP 06/07/2012   SpO2 98%   BMI 26.61 kg/m   BP Readings from Last 3 Encounters:  04/14/23 130/78  01/13/23 (!) 138/110  01/11/23 (!) 170/100    Wt Readings from Last 3 Encounters:  04/14/23 155 lb (70.3 kg)  01/13/23 158 lb 6.4 oz (71.8 kg)  01/11/23 154 lb 12.8 oz (70.2 kg)    Physical Exam  Lab Results  Component Value Date   WBC 12.4 (H) 12/25/2022   HGB 12.0 12/25/2022   HCT 37.8 12/25/2022   PLT 340 12/25/2022   GLUCOSE 107 (H) 01/11/2023   CHOL 255 (H) 03/26/2022   TRIG 232.0 (H) 03/26/2022   HDL 46.60 03/26/2022   LDLDIRECT 148.0 03/26/2022   LDLCALC 122 (  H) 02/11/2021   ALT 20 01/11/2023   AST 19 01/11/2023   NA 143 01/11/2023   K 3.9 01/11/2023   CL 102 01/11/2023   CREATININE 1.49 (H) 01/11/2023   BUN 13 01/11/2023   CO2 24 01/11/2023   TSH 1.99 08/12/2022   INR 1.1 12/23/2022   HGBA1C 5.5 06/18/2021    ECHOCARDIOGRAM COMPLETE Result Date: 12/24/2022    ECHOCARDIOGRAM REPORT   Patient Name:   Hayley Miller Date of Exam: 12/24/2022 Medical Rec #:  [id number]         Height:       64.0 in Accession #:    [id number]        Weight:       156.0 lb Date of Birth:  1960/06/28         BSA:          1.760 m Patient Age:    62 years          BP:           162/97 mmHg Patient Gender: F                 HR:           83 bpm. Exam Location:  Inpatient Procedure: 2D Echo, Cardiac Doppler and Color Doppler Indications:    Syncope R55                 Pericardial effusion I31.3                 NSTEMI I21.4  History:        Patient has  prior history of Echocardiogram examinations, most                 recent 12/15/2018. CAD and Previous Myocardial Infarction; Risk                 Factors:Former Smoker and Hypertension.  Sonographer:    Darlys Gales Referring Phys: [id number] Hayley Miller IMPRESSIONS  1. Left ventricular ejection fraction, by estimation, is 40 to 45%. The left ventricle has mildly decreased function. The left ventricle demonstrates regional wall motion abnormalities (see scoring diagram/findings for description). There is mild left ventricular hypertrophy of the septal segment. Left ventricular diastolic parameters are consistent with Grade I diastolic dysfunction (impaired relaxation). Elevated left ventricular end-diastolic pressure.  2. Right ventricular systolic function is normal. The right ventricular size is normal. There is normal pulmonary artery systolic pressure.  3. A small pericardial effusion is present. The pericardial effusion is anterior to the right ventricle. There is no evidence of cardiac tamponade.  4. The mitral valve is normal in structure. Mild mitral valve regurgitation. No evidence of mitral stenosis.  5. The aortic valve is normal in structure. Aortic valve regurgitation is trivial. No aortic stenosis is present.  6. The inferior vena cava is normal in size with greater than 50% respiratory variability, suggesting right atrial pressure of 3 mmHg. FINDINGS  Left Ventricle: Left ventricular ejection fraction, by estimation, is 40 to 45%. The left ventricle has mildly decreased function. The left ventricle demonstrates regional wall motion abnormalities. The left ventricular internal cavity size was normal in size. There is mild left ventricular hypertrophy of the septal segment. Left ventricular diastolic parameters are consistent with Grade I diastolic dysfunction (impaired relaxation). Elevated left ventricular end-diastolic pressure.  LV Wall Scoring: The entire anterior wall, inferior septum, inferior  wall, posterior wall, and basal anteroseptal segment are hypokinetic. The antero-lateral wall,  mid anteroseptal segment, apical lateral segment, apical inferior segment, and apex are normal. Right Ventricle: The right ventricular size is normal. No increase in right ventricular wall thickness. Right ventricular systolic function is normal. There is normal pulmonary artery systolic pressure. The tricuspid regurgitant velocity is 2.52 m/s, and  with an assumed right atrial pressure of 3 mmHg, the estimated right ventricular systolic pressure is 28.4 mmHg. Left Atrium: Left atrial size was normal in size. Right Atrium: Right atrial size was normal in size. Pericardium: A small pericardial effusion is present. The pericardial effusion is anterior to the right ventricle. There is no evidence of cardiac tamponade. Mitral Valve: The mitral valve is normal in structure. Mild mitral valve regurgitation. No evidence of mitral valve stenosis. Tricuspid Valve: The tricuspid valve is normal in structure. Tricuspid valve regurgitation is trivial. No evidence of tricuspid stenosis. Aortic Valve: The aortic valve is normal in structure. Aortic valve regurgitation is trivial. No aortic stenosis is present. Aortic valve mean gradient measures 4.0 mmHg. Aortic valve peak gradient measures 6.7 mmHg. Aortic valve area, by VTI measures 1.94 cm. Pulmonic Valve: The pulmonic valve was normal in structure. Pulmonic valve regurgitation is not visualized. No evidence of pulmonic stenosis. Aorta: The aortic root is normal in size and structure. Venous: The inferior vena cava is normal in size with greater than 50% respiratory variability, suggesting right atrial pressure of 3 mmHg. IAS/Shunts: No atrial level shunt detected by color flow Doppler.  LEFT VENTRICLE PLAX 2D LVIDd:         3.50 cm   Diastology LVIDs:         2.40 cm   LV e' medial:    3.48 cm/s LV PW:         0.90 cm   LV E/e' medial:  19.6 LV IVS:        1.10 cm   LV e' lateral:    3.59 cm/s LVOT diam:     1.90 cm   LV E/e' lateral: 19.0 LV SV:         55 LV SV Index:   31 LVOT Area:     2.84 cm  LEFT ATRIUM             Index        RIGHT ATRIUM           Index LA Vol (A2C):   46.0 ml 26.13 ml/m  RA Area:     12.70 cm LA Vol (A4C):   47.0 ml 26.70 ml/m  RA Volume:   30.00 ml  17.04 ml/m LA Biplane Vol: 46.7 ml 26.53 ml/m  AORTIC VALVE AV Area (Vmax):    2.22 cm AV Area (Vmean):   2.14 cm AV Area (VTI):     1.94 cm AV Vmax:           129.00 cm/s AV Vmean:          101.000 cm/s AV VTI:            0.285 m AV Peak Grad:      6.7 mmHg AV Mean Grad:      4.0 mmHg LVOT Vmax:         101.00 cm/s LVOT Vmean:        76.400 cm/s LVOT VTI:          0.195 m LVOT/AV VTI ratio: 0.68  AORTA Ao Asc diam: 3.80 cm MITRAL VALVE  TRICUSPID VALVE MV Area (PHT): 6.07 cm     TR Peak grad:   25.4 mmHg MV Decel Time: 125 msec     TR Vmax:        252.00 cm/s MV E velocity: 68.10 cm/s MV A velocity: 123.00 cm/s  SHUNTS MV E/A ratio:  0.55         Systemic VTI:  0.20 m                             Systemic Diam: 1.90 cm Chilton Si MD Electronically signed by Chilton Si MD Signature Date/Time: 12/24/2022/12:18:38 PM    Final    CT HEAD WO CONTRAST ( ) Result Date: 12/24/2022 CLINICAL DATA:  Near syncope. EXAM: CT HEAD WITHOUT CONTRAST TECHNIQUE: Contiguous axial images were obtained from the base of the skull through the vertex without intravenous contrast. RADIATION DOSE REDUCTION: This exam was performed according to the departmental dose-optimization program which includes automated exposure control, adjustment of the mA and/or kV according to patient size and/or use of iterative reconstruction technique. COMPARISON:  July 18, 2017 FINDINGS: Brain: No evidence of acute infarction, hemorrhage, hydrocephalus, extra-axial collection or mass lesion/mass effect. Mild areas of decreased attenuation are seen within the white matter tracts of the supratentorial brain, consistent with  microvascular disease changes. Vascular: No hyperdense vessel or unexpected calcification. Skull: Normal. Negative for fracture or focal lesion. Sinuses/Orbits: No acute finding. Other: None. IMPRESSION: Mild, chronic white matter small vessel ischemic changes without evidence of an acute intracranial abnormality. Electronically Signed   By: Aram Candela M.D.   On: 12/24/2022 00:05   DG Chest Port 1 View Result Date: 12/23/2022 CLINICAL DATA:  Shortness of breath EXAM: PORTABLE CHEST 1 VIEW COMPARISON:  12/14/2018 FINDINGS: Borderline cardiomegaly. No acute airspace disease or effusion. No pneumothorax. IMPRESSION: No active disease. Electronically Signed   By: Jasmine Pang M.D.   On: 12/23/2022 20:08   CT Angio Chest/Abd/Pel for Dissection W and/or Wo Contrast Result Date: 12/23/2022 CLINICAL DATA:  Nausea and dizziness for a few hours. EXAM: CT ANGIOGRAPHY CHEST, ABDOMEN AND PELVIS TECHNIQUE: Non-contrast CT of the chest was initially obtained. Multidetector CT imaging through the chest, abdomen and pelvis was performed using the standard protocol during bolus administration of intravenous contrast. Multiplanar reconstructed images and MIPs were obtained and reviewed to evaluate the vascular anatomy. RADIATION DOSE REDUCTION: This exam was performed according to the departmental dose-optimization program which includes automated exposure control, adjustment of the mA and/or kV according to patient size and/or use of iterative reconstruction technique. CONTRAST:  OMNIPAQUE IOHEXOL 350 MG/ML SOLN COMPARISON:  Radiograph 12/14/2018 and CTA chest abdomen and pelvis 07/18/2017 FINDINGS: CTA CHEST FINDINGS Cardiovascular: No intramural hematoma, aneurysm, or dissection in the thoracic aorta. No pericardial effusion. No central pulmonary embolism Mediastinum/Nodes: Small hiatal hernia. Trachea is unremarkable. No thoracic adenopathy. Lungs/Pleura: Bilateral lower lobe scarring and atelectasis with mild  bronchiolectasis likely postinfectious. No pleural effusion or pneumothorax Musculoskeletal: No acute fracture. Review of the MIP images confirms the above findings. CTA ABDOMEN AND PELVIS FINDINGS VASCULAR Mild scattered calcified atherosclerotic plaque without hemodynamically significant stenosis. No aortic aneurysm or dissection. The mesenteric and renal arteries are widely patent. Widely patent inflow and outflow arteries bilaterally. Review of the MIP images confirms the above findings. NON-VASCULAR Hepatobiliary: No focal liver abnormality is seen. No gallstones, gallbladder wall thickening, or biliary dilatation. Pancreas: Unremarkable. Spleen: Unremarkable. Adrenals/Urinary Tract: Stable adrenal glands. Bilateral nonobstructing nephrolithiasis. No  hydronephrosis. Unremarkable bladder. Stomach/Bowel: Normal caliber large and small bowel. No bowel wall thickening. Colonic diverticulosis without diverticulitis. Normal appendix. Stomach is within normal limits. Lymphatic: No lymphadenopathy. Reproductive: Hysterectomy. Other: No free intraperitoneal fluid or air. Musculoskeletal: No acute fracture. Review of the MIP images confirms the above findings. IMPRESSION: 1. No acute aortic syndrome. 2. No acute findings in the chest, abdomen, or pelvis. 3. Bilateral nonobstructing nephrolithiasis. 4. Colonic diverticulosis without diverticulitis. Electronically Signed   By: Minerva Fester M.D.   On: 12/23/2022 20:01    Assessment & Plan:   Problem List Items Addressed This Visit     Chronic pain syndrome   LBP On Tramadol XR  Potential benefits of a long term opioids use as well as potential risks (i.e. addiction risk, apnea etc) and complications (i.e. Somnolence, constipation and others) were explained to the patient and were aknowledged.       Relevant Medications   traMADol (ULTRAM-ER) 100 MG 24 hr tablet   traMADol (ULTRAM-ER) 200 MG 24 hr tablet   CAD (coronary artery disease) (Chronic)    Chronic Dr Tenny Craw ASA, NTG prn, Pravachol  Potential benefits of a long term HRT use in pt's w/CAD as well as potential risks  and complications were explained to the patient and were aknowledged.      CKD (chronic kidney disease) stage 4, GFR 15-29 ml/min (HCC)   We will monitor GFR. Hydrate well      Hot flashes due to menopause - Primary   C/o severe hot flashes q 20-30 min, bad at night x weeks. Rx was stopped due to high BP. No h/o DVT. Nonsmoker. Start Estrace po. Pt declined a patch.  Potential benefits of a long term HRT use as well as potential risks  and complications were explained to the patient and were aknowledged.          Meds ordered this encounter  Medications   estradiol (ESTRACE) 0.5 MG tablet    Sig: Take 1 tablet (0.5 mg total) by mouth daily.    Dispense:  30 tablet    Refill:  3   traMADol (ULTRAM-ER) 100 MG 24 hr tablet    Sig: Take 1 tablet (100 mg total) by mouth at bedtime.    Dispense:  30 tablet    Refill:  2   traMADol (ULTRAM-ER) 200 MG 24 hr tablet    Sig: Take 1 tablet (200 mg total) by mouth every morning.    Dispense:  30 tablet    Refill:  3      Follow-up: Return in about 3 months (around 07/13/2023) for a follow-up visit.  Sonda Primes, MD

## 2023-04-14 NOTE — Assessment & Plan Note (Signed)
Chronic Dr Tenny Craw ASA, NTG prn, Pravachol  Potential benefits of a long term HRT use in pt's w/CAD as well as potential risks  and complications were explained to the patient and were aknowledged.

## 2023-04-14 NOTE — Assessment & Plan Note (Signed)
We will monitor GFR. Hydrate well

## 2023-04-23 DIAGNOSIS — G4733 Obstructive sleep apnea (adult) (pediatric): Secondary | ICD-10-CM | POA: Diagnosis not present

## 2023-05-20 ENCOUNTER — Ambulatory Visit: Payer: Medicare Other

## 2023-05-20 VITALS — BP 138/68 | HR 113 | Ht 64.0 in | Wt 152.6 lb

## 2023-05-20 DIAGNOSIS — Z1211 Encounter for screening for malignant neoplasm of colon: Secondary | ICD-10-CM | POA: Diagnosis not present

## 2023-05-20 DIAGNOSIS — Z124 Encounter for screening for malignant neoplasm of cervix: Secondary | ICD-10-CM

## 2023-05-20 DIAGNOSIS — Z1231 Encounter for screening mammogram for malignant neoplasm of breast: Secondary | ICD-10-CM

## 2023-05-20 DIAGNOSIS — Z0000 Encounter for general adult medical examination without abnormal findings: Secondary | ICD-10-CM

## 2023-05-20 NOTE — Progress Notes (Cosign Needed Addendum)
 Subjective:   Hayley Miller Flight is a 63 y.o. who presents for a Medicare Wellness preventive visit.  Visit Complete: In person  VideoDeclined- This patient declined Interactive audio and Acupuncturist. Therefore the visit was completed with audio only.  AWV Questionnaire: No: Patient Medicare AWV questionnaire was not completed prior to this visit.  Cardiac Risk Factors include: advanced age (>58men, >16 women);hypertension;dyslipidemia     Objective:    Today's Vitals   05/20/23 1416 05/20/23 1433  BP: (!) 142/68 138/68  Pulse: (!) 113   Weight: 152 lb 9.6 oz (69.2 kg)   Height: 5\' 4"  (1.626 m)    Body mass index is 26.19 kg/m.     05/20/2023    2:04 PM 12/24/2022    9:32 AM 05/19/2022    1:35 PM 04/21/2021    8:52 PM 01/16/2019    4:53 PM 12/14/2018   11:50 PM 12/14/2018    6:19 PM  Advanced Directives  Does Patient Have a Medical Advance Directive? No No No No No No No  Would patient like information on creating a medical advance directive? Yes (MAU/Ambulatory/Procedural Areas - Information given) No - Patient declined No - Patient declined No - Patient declined  No - Patient declined     Current Medications (verified) Outpatient Encounter Medications as of 05/20/2023  Medication Sig   acetaminophen (TYLENOL) 500 MG tablet Take 500 mg by mouth every 6 (six) hours as needed.   albuterol (VENTOLIN HFA) 108 (90 Base) MCG/ACT inhaler Inhale 2 puffs into the lungs every 4 (four) hours as needed for wheezing or shortness of breath.   aspirin EC 81 MG tablet Take 81 mg by mouth daily.   azelastine (ASTELIN) 0.1 % nasal spray Place 1 spray into both nostrils 2 (two) times daily. Use in each nostril as directed   carvedilol (COREG) 25 MG tablet Take 1 tablet (25 mg total) by mouth 2 (two) times daily.   cholecalciferol (VITAMIN D3) 25 MCG (1000 UNIT) tablet Take 1,000 Units by mouth daily.   cyclobenzaprine (FLEXERIL) 5 MG tablet Take 1 tablet (5 mg total) by mouth  at bedtime.   esomeprazole (NEXIUM) 40 MG capsule Take 1 capsule (40 mg total) by mouth daily.   estradiol (ESTRACE) 0.5 MG tablet Take 1 tablet (0.5 mg total) by mouth daily.   Fezolinetant (VEOZAH) 45 MG TABS Take 1 tablet (45 mg total) by mouth daily.   hydrALAZINE (APRESOLINE) 100 MG tablet Take 1 tablet (100 mg total) by mouth 2 (two) times daily.   hydrochlorothiazide (HYDRODIURIL) 25 MG tablet Take 1 tablet (25 mg total) by mouth daily.   Multiple Vitamin (MULTIVITAMIN WITH MINERALS) TABS tablet Take 1 tablet by mouth daily.   nitroGLYCERIN (NITROSTAT) 0.4 MG SL tablet Place 1 tablet (0.4 mg total) under the tongue every 5 (five) minutes as needed for chest pain.   Tetrahydrozoline HCl (VISINE OP) Place 1 drop into both eyes daily as needed (dry eyes).   traMADol (ULTRAM-ER) 100 MG 24 hr tablet Take 1 tablet (100 mg total) by mouth at bedtime.   traMADol (ULTRAM-ER) 200 MG 24 hr tablet Take 1 tablet (200 mg total) by mouth every morning.   No facility-administered encounter medications on file as of 05/20/2023.    Allergies (verified) Amlodipine, Clonidine hydrochloride, Codeine, Gabapentin, Ibuprofen, Lovastatin, and Spironolactone   History: Past Medical History:  Diagnosis Date   Allergy    Anemia    Anxiety    CAD (coronary artery disease)  a. s/p AL MI in 2006 tx with PTCA to LAD;  b. LHC (10/2009): lum irregs in LAD, o/w no CAD, EF 55-60%;  c. Myoview (07/2011):  no ischemia, EF 63%;  d. Echo (11/2011):  mild LVH, EF 60-65%, Gr 1 DD;  e.  Lexiscan Myoview (04/2013):  Inferior bowel atten artifact with inferoapical defect; no ischemia; EF 58% (low risk)   Cameron lesion, acute    Chronic lower back pain    Chronic pain syndrome    Daily headache    Depression    Erosive esophagitis    Fibromyalgia    GERD (gastroesophageal reflux disease)    Hemorrhage    upper gastrointestional. GI bleed 10/09 due to NSAID   Hiatal hernia    HLD (hyperlipidemia)    HTN (hypertension)     Myocardial infarction (HCC) 2006 X 2   Rhinitis    Seasonal allergies    Past Surgical History:  Procedure Laterality Date   BLADDER SUSPENSION N/A 05/08/2013   Procedure: TRANSVAGINAL TAPE (TVT) PROCEDURE;  Surgeon: Levi Aland, MD;  Location: [facility];  Service: Gynecology;  Laterality: N/A;   BREAST CYST EXCISION Left 2002   CARDIAC CATHETERIZATION  07/19/2017   COLONOSCOPY     CORONARY ANGIOPLASTY  2006   CYSTOSCOPY N/A 05/08/2013   Procedure: CYSTOSCOPY;  Surgeon: Levi Aland, MD;  Location: [facility];  Service: Gynecology;  Laterality: N/A;   LAPAROSCOPIC ASSISTED VAGINAL HYSTERECTOMY N/A 05/08/2013   Procedure: LAPAROSCOPIC ASSISTED VAGINAL HYSTERECTOMY;  Surgeon: Levi Aland, MD;  Location: [facility];  Service: Gynecology;  Laterality: N/A;   LAPAROSCOPIC BILATERAL SALPINGO OOPHERECTOMY N/A 05/08/2013   Procedure: LAPAROSCOPIC BILATERAL SALPINGO OOPHORECTOMY;  Surgeon: Levi Aland, MD;  Location: [facility];  Service: Gynecology;  Laterality: N/A;   LEFT HEART CATH AND CORONARY ANGIOGRAPHY N/A 07/19/2017   Procedure: LEFT HEART CATH AND CORONARY ANGIOGRAPHY;  Surgeon: [place], Peter M, MD;  Location: [facility] INVASIVE CV LAB;  Service: Cardiovascular;  Laterality: N/A;   UPPER GASTROINTESTINAL ENDOSCOPY     Family History  Problem Relation Age of Onset   Hypertension Mother    Heart murmur Mother    Colon polyps Mother    Colon cancer Maternal Aunt    Heart disease Son    Heart disease Maternal Grandmother    Diabetes Maternal Aunt    Esophageal cancer Neg Hx    Rectal cancer Neg Hx    Stomach cancer Neg Hx    Breast cancer Neg Hx    Social History   Socioeconomic History   Marital status: Divorced    Spouse name: Not on file   Number of children: 1   Years of education: Not on file   Highest education level: Not on file  Occupational History   Occupation: unemployed    Employer: UNEMPLOYED  Tobacco Use   Smoking status: Former    Current packs/[place]: 0.00     Average packs/[place]: 1 pack/Krauter for 3.0 years (3.0 ttl pk-yrs)    Types: Cigarettes    Start date: 04/17/2014    Quit date: 04/17/2017    Years since quitting: 6.0   Smokeless tobacco: Never   Tobacco comments:    Divorced, lives with female domestic partner  Vaping Use   Vaping status: Never Used  Substance and Sexual Activity   Alcohol use: Not Currently    Comment: 07/20/2017 "might drink once or twice/year"   Drug use: No   Sexual activity: Yes    Birth control/protection:  Other-see comments    Comment: has a female domestic partner  Other Topics Concern   Not on file  Social History Narrative   Lives alone - divorced; 1 child.    Used to live with female domestic partner   Unemployed - disability.          Social Drivers of Corporate investment banker Strain: Low Risk  (05/20/2023)   Overall Financial Resource Strain (CARDIA)    Difficulty of Paying Living Expenses: Not hard at all  Food Insecurity: No Food Insecurity (05/20/2023)   Hunger Vital Sign    Worried About Running Out of Food in the Last Year: Never true    Ran Out of Food in the Last Year: Never true  Transportation Needs: No Transportation Needs (05/20/2023)   PRAPARE - Administrator, Civil Service (Medical): No    Lack of Transportation (Non-Medical): No  Physical Activity: Inactive (05/20/2023)   Exercise Vital Sign    Days of Exercise per Week: 0 days    Minutes of Exercise per Session: 0 min  Stress: Stress Concern Present (05/20/2023)   [employer] of Occupational Health - Occupational Stress Questionnaire    Feeling of Stress : To some extent  Social Connections: Socially Isolated (05/20/2023)   Social Connection and Isolation Panel [NHANES]    Frequency of Communication with Friends and Family: More than three times a week    Frequency of Social Gatherings with Friends and Family: Once a week    Attends Religious Services: Never    Database administrator or Organizations: No     Attends Engineer, structural: Never    Marital Status: Divorced    Tobacco Counseling Counseling given: No Tobacco comments: Divorced, lives with female domestic partner    Clinical Intake:  Pre-visit preparation completed: Yes  Pain : No/denies pain     Nutritional Risks: None Diabetes: No  How often do you need to have someone help you when you read instructions, pamphlets, or other written materials from your doctor or pharmacy?: 1 - Never  Interpreter Needed?: No  Information entered by :: Hassell Halim, CMA   Activities of Daily Living Completed 05/20/2023    05/20/2023    2:12 PM 12/24/2022    9:32 AM  In your present state of health, do you have any difficulty performing the following activities:  Hearing? 0 0  Vision? 0 0  Difficulty concentrating or making decisions? 0 0  Walking or climbing stairs? 0   Dressing or bathing? 0   Doing errands, shopping? 0 0  Preparing Food and eating ? N   Using the Toilet? N   In the past six months, have you accidently leaked urine? N   Do you have problems with loss of bowel control? N   Managing your Medications? N   Managing your Finances? N   Housekeeping or managing your Housekeeping? N     Patient Care Team: Plotnikov, Georgina Quint, MD as PCP - General (Internal Medicine) Pricilla Riffle, MD as PCP - Cardiology (Cardiology) Runell Gess, MD as PCP - [facility] (Cardiology) Quintella Reichert, MD as PCP - Sleep Medicine (Cardiology) Levi Aland, MD (Obstetrics and Gynecology) Meryl Dare, MD (Inactive) (Gastroenterology) Miguel Aschoff, MD (Inactive) (Obstetrics and Gynecology) Erlene Quan Vinnie Level, [facility] (Inactive) as Pharmacist (Pharmacist)  Indicate any recent Medical Services you may have received from other than [facility] providers in the past year (date may be approximate).  Assessment:   This is a routine wellness examination for Koralynn.  Hearing/Vision screen Hearing Screening - Comments::  Denies hearing difficulties   Vision Screening - Comments:: Wears rx glasses - up to date with routine eye exams with [facility]   Goals Addressed               This Visit's Progress     Patient Stated (pt-stated)        Patient stated she plans to work on her blood pressure by watching her diet.         Depression Screen Completed 05/20/2023    05/20/2023    2:20 PM 04/14/2023    2:23 PM 05/19/2022    1:36 PM 03/26/2022    3:07 PM 09/21/2021    1:30 PM 04/30/2020    3:22 PM  PHQ 2/9 Scores  PHQ - 2 Score 0 0 0 0 0 0  PHQ- 9 Score   6 3  0    Fall Risk Completed 05/20/2023    05/20/2023    2:13 PM 04/14/2023    2:23 PM 05/19/2022    1:36 PM 03/26/2022    3:07 PM 09/21/2021    1:30 PM  Fall Risk   Falls in the past year? 0 0 0 0 0  Number falls in past yr: 0 0 0 0 0  Injury with Fall? 0 0 0 0 0  Risk for fall due to : Impaired balance/gait;History of fall(s) Impaired balance/gait No Fall Risks No Fall Risks No Fall Risks  Follow up Falls evaluation completed;Falls prevention discussed Falls evaluation completed Falls prevention discussed Falls evaluation completed Falls evaluation completed    MEDICARE RISK AT HOME: Completed 05/20/2023 Medicare Risk at Home Any stairs in or around the home?: Yes (outside of home) If so, are there any without handrails?: No Home free of loose throw rugs in walkways, pet beds, electrical cords, etc?: Yes Adequate lighting in your home to reduce risk of falls?: Yes Life alert?: No Use of a cane, walker or w/c?: No Grab bars in the bathroom?: No Shower chair or bench in shower?: No Elevated toilet seat or a handicapped toilet?: No  TIMED UP AND GO:  Was the test performed?  No  Cognitive Function: 6CIT completed        05/20/2023    2:14 PM 05/19/2022    1:36 PM  6CIT Screen  What Year? 0 points 0 points  What month? 0 points 0 points  What time? 0 points 0 points  Count back from 20 0 points 0 points  Months in reverse 2  points 0 points  Repeat phrase 4 points 0 points  Total Score 6 points 0 points    Immunizations Immunization History  Administered Date(s) Administered   Influenza Whole 01/12/2008   Influenza,inj,Quad PF,6+ Mos 01/22/2014   Influenza-Unspecified 01/27/2018   Tdap 03/11/2014    Screening Tests Health Maintenance  Topic Date Due   Pneumococcal Vaccine 21-36 Years old (1 of 2 - PCV) Never done   Hepatitis C Screening  Never done   Cervical Cancer Screening (HPV/Pap Cotest)  Never done   Colonoscopy  11/01/2018   MAMMOGRAM  12/28/2020   DTaP/Tdap/Td (2 - Td or Tdap) 03/11/2024   Medicare Annual Wellness (AWV)  05/19/2024   HIV Screening  Completed   HPV VACCINES  Aged Out   INFLUENZA VACCINE  Discontinued   COVID-19 Vaccine  Discontinued   Zoster Vaccines- Shingrix  Discontinued  Health Maintenance  Health Maintenance Due  Topic Date Due   Pneumococcal Vaccine 69-11 Years old (1 of 2 - PCV) Never done   Hepatitis C Screening  Never done   Cervical Cancer Screening (HPV/Pap Cotest)  Never done   Colonoscopy  11/01/2018   MAMMOGRAM  12/28/2020   Health Maintenance Items Addressed: 05/20/2023 Referral sent to GI for colonoscopy, Ordered a repeat Colonoscopy.  Mammogram Status: Ordered a repeat Mammogram on 05/20/2023.   Gynecology Status: Referral placed today for pap smear in 2025.  Additional Screening:  Vision Screening: Recommended annual ophthalmology exams for early detection of glaucoma and other disorders of the eye.  Patient states had an exam with [facility] in 2024.  Dental Screening: Recommended annual dental exams for proper oral hygiene  Community Resource Referral / Chronic Care Management: CRR required this visit?  No   CCM required this visit?  No     Plan:     I have personally reviewed and noted the following in the patient's chart:   Medical and social history Use of alcohol, tobacco or illicit drugs  Current medications and  supplements including opioid prescriptions. Patient is currently taking opioid prescriptions. Information provided to patient regarding non-opioid alternatives. Patient advised to discuss non-opioid treatment plan with their provider. Functional ability and status Nutritional status Physical activity Advanced directives List of other physicians Hospitalizations, surgeries, and ER visits in previous 12 months Vitals Screenings to include cognitive, depression, and falls Referrals and appointments  In addition, I have reviewed and discussed with patient certain preventive protocols, quality metrics, and best practice recommendations. A written personalized care plan for preventive services as well as general preventive health recommendations were provided to patient.     Darreld Mclean, CMA   05/20/2023   After Visit Summary: (MyChart) Due to this being a telephonic visit, the after visit summary with patients personalized plan was offered to patient via MyChart   Notes:  Patient declined Pneumonia and Hepatitis C vaccines but was given education.     Medical screening examination/treatment/procedure(s) were performed by non-physician practitioner and as supervising physician I was immediately available for consultation/collaboration.  I agree with above. Jacinta Shoe, MD

## 2023-05-20 NOTE — Patient Instructions (Addendum)
[name] , Thank you for taking time to come for your Medicare Wellness Visit. I appreciate your ongoing commitment to your health goals. Please review the following plan we discussed and let me know if I can assist you in the future.   Referrals/Orders/Follow-Ups/Clinician Recommendations: Aim for 30 minutes of exercise or brisk walking, 6-8 glasses of water, and 5 servings of fruits and vegetables each [name]. Ordered a repeat Colonoscopy, Mammogram, and pap smear.  Patient has been advised of needing the Pneumonia and Hepatitis C vaccines.    This is a list of the screening recommended for you and due dates:  Health Maintenance  Topic Date Due   Pneumococcal Vaccination (1 of 2 - PCV) Never done   Hepatitis C Screening  Never done   Pap with HPV screening  Never done   Colon Cancer Screening  11/01/2018   Mammogram  12/28/2020   DTaP/Tdap/Td vaccine (2 - Td or Tdap) 03/11/2024   Medicare Annual Wellness Visit  05/19/2024   HIV Screening  Completed   HPV Vaccine  Aged Out   Flu Shot  Discontinued   COVID-19 Vaccine  Discontinued   Zoster (Shingles) Vaccine  Discontinued    Advanced directives: (Provided) Advance directive discussed with you today. I have provided a copy for you to complete at home and have notarized. Once this is complete, please bring a copy in to our office so we can scan it into your chart.   Next Medicare Annual Wellness Visit scheduled for next year: Yes - 2026  Managing Pain Without Opioids Opioids are strong medicines used to treat moderate to severe pain. For some people, especially those who have long-term (chronic) pain, opioids may not be the best choice for pain management due to: Side effects like nausea, constipation, and sleepiness. The risk of addiction (opioid use disorder). The longer you take opioids, the greater your risk of addiction. Pain that lasts for more than 3 months is called chronic pain. Managing chronic pain usually requires more than one  approach and is often provided by a team of health care providers working together (multidisciplinary approach). Pain management may be done at a pain management center or pain clinic. How to manage pain without the use of opioids Use non-opioid medicines Non-opioid medicines for pain may include: Over-the-counter or prescription non-steroidal anti-inflammatory drugs (NSAIDs). These may be the first medicines used for pain. They work well for muscle and bone pain, and they reduce swelling. Acetaminophen. This over-the-counter medicine may work well for milder pain but not swelling. Antidepressants. These may be used to treat chronic pain. A certain type of antidepressant (tricyclics) is often used. These medicines are given in lower doses for pain than when used for depression. Anticonvulsants. These are usually used to treat seizures but may also reduce nerve (neuropathic) pain. Muscle relaxants. These relieve pain caused by sudden muscle tightening (spasms). You may also use a pain medicine that is applied to the skin as a patch, cream, or gel (topical analgesic), such as a numbing medicine. These may cause fewer side effects than medicines taken by mouth. Do certain therapies as directed Some therapies can help with pain management. They include: Physical therapy. You will do exercises to gain strength and flexibility. A physical therapist may teach you exercises to move and stretch parts of your body that are weak, stiff, or painful. You can learn these exercises at physical therapy visits and practice them at home. Physical therapy may also involve: Massage. Heat wraps or applying  heat or cold to affected areas. Electrical signals that interrupt pain signals (transcutaneous electrical nerve stimulation, TENS). Weak lasers that reduce pain and swelling (low-level laser therapy). Signals from your body that help you learn to regulate pain (biofeedback). Occupational therapy. This helps you to  learn ways to function at home and work with less pain. Recreational therapy. This involves trying new activities or hobbies, such as a physical activity or drawing. Mental health therapy, including: Cognitive behavioral therapy (CBT). This helps you learn coping skills for dealing with pain. Acceptance and commitment therapy (ACT) to change the way you think and react to pain. Relaxation therapies, including muscle relaxation exercises and mindfulness-based stress reduction. Pain management counseling. This may be individual, family, or group counseling.  Receive medical treatments Medical treatments for pain management include: Nerve block injections. These may include a pain blocker and anti-inflammatory medicines. You may have injections: Near the spine to relieve chronic back or neck pain. Into joints to relieve back or joint pain. Into nerve areas that supply a painful area to relieve body pain. Into muscles (trigger point injections) to relieve some painful muscle conditions. A medical device placed near your spine to help block pain signals and relieve nerve pain or chronic back pain (spinal cord stimulation device). Acupuncture. Follow these instructions at home Medicines Take over-the-counter and prescription medicines only as told by your health care provider. If you are taking pain medicine, ask your health care providers about possible side effects to watch out for. Do not drive or use heavy machinery while taking prescription opioid pain medicine. Lifestyle  Do not use drugs or alcohol to reduce pain. If you drink alcohol, limit how much you have to: 0-1 drink a [place] for women who are not pregnant. 0-2 drinks a [place] for men. Know how much alcohol is in a drink. In the [place] one drink equals one 12 oz bottle of beer (355 mL), one 5 oz glass of wine (148 mL), or one 1 oz glass of hard liquor (44 mL). Do not use any products that contain nicotine or tobacco. These products  include cigarettes, chewing tobacco, and vaping devices, such as e-cigarettes. If you need help quitting, ask your health care provider. Eat a healthy diet and maintain a healthy weight. Poor diet and excess weight may make pain worse. Eat foods that are high in fiber. These include fresh fruits and vegetables, whole grains, and beans. Limit foods that are high in fat and processed sugars, such as fried and sweet foods. Exercise regularly. Exercise lowers stress and may help relieve pain. Ask your health care provider what activities and exercises are safe for you. If your health care provider approves, join an exercise class that combines movement and stress reduction. Examples include yoga and tai chi. Get enough sleep. Lack of sleep may make pain worse. Lower stress as much as possible. Practice stress reduction techniques as told by your therapist. General instructions Work with all your pain management providers to find the treatments that work best for you. You are an important member of your pain management team. There are many things you can do to reduce pain on your own. Consider joining an online or in-person support group for people who have chronic pain. Keep all follow-up visits. This is important. Where to find more information You can find more information about managing pain without opioids from: American Academy of Pain Medicine: painmed.org Institute for Chronic Pain: instituteforchronicpain.org American Chronic Pain Association: theacpa.org Contact a health care provider  if: You have side effects from pain medicine. Your pain gets worse or does not get better with treatments or home therapy. You are struggling with anxiety or depression. Summary Many types of pain can be managed without opioids. Chronic pain may respond better to pain management without opioids. Pain is best managed when you and a team of health care providers work together. Pain management without opioids  may include non-opioid medicines, medical treatments, physical therapy, mental health therapy, and lifestyle changes. Tell your health care providers if your pain gets worse or is not being managed well enough. This information is not intended to replace advice given to you by your health care provider. Make sure you discuss any questions you have with your health care provider. Document Revised: 06/25/2020 Document Reviewed: 06/25/2020 Elsevier Patient Education  2024 [employer].

## 2023-05-30 ENCOUNTER — Telehealth: Payer: Self-pay | Admitting: Internal Medicine

## 2023-05-30 NOTE — Telephone Encounter (Signed)
 Copied from CRM [id number]. Topic: Clinical - Medical Advice >> May 30, 2023  3:27 PM [place] wrote: Reason for CRM: [id number] ext [phone] Trey Paula calling from [place] [facility] [place]- looking for code  i25.10 and looking to see if the Dr would consider a [place] for the Patient

## 2023-06-03 NOTE — Telephone Encounter (Signed)
 I do not know what that is.  Thank you

## 2023-06-09 DIAGNOSIS — G4733 Obstructive sleep apnea (adult) (pediatric): Secondary | ICD-10-CM | POA: Diagnosis not present

## 2023-07-08 ENCOUNTER — Encounter: Payer: Self-pay | Admitting: Internal Medicine

## 2023-07-13 ENCOUNTER — Ambulatory Visit: Payer: Medicare Other | Admitting: Internal Medicine

## 2023-07-13 ENCOUNTER — Other Ambulatory Visit: Payer: Self-pay | Admitting: Internal Medicine

## 2023-08-09 DIAGNOSIS — G4733 Obstructive sleep apnea (adult) (pediatric): Secondary | ICD-10-CM | POA: Diagnosis not present

## 2023-08-10 ENCOUNTER — Ambulatory Visit: Admitting: Internal Medicine

## 2023-09-08 DIAGNOSIS — G4733 Obstructive sleep apnea (adult) (pediatric): Secondary | ICD-10-CM | POA: Diagnosis not present

## 2023-09-09 DIAGNOSIS — G4733 Obstructive sleep apnea (adult) (pediatric): Secondary | ICD-10-CM | POA: Diagnosis not present

## 2023-09-16 ENCOUNTER — Telehealth: Payer: Self-pay | Admitting: Internal Medicine

## 2023-09-16 NOTE — Telephone Encounter (Signed)
 Copied from CRM [id number]. Topic: Clinical - Prescription Issue >> Sep 16, 2023 11:03 AM Hayley Miller wrote: Reason for CRM: [employer] [place] called regarding a medication statins for heart therapy. Calling in a recommendation please call back at [phone] ext [phone] [place]

## 2023-09-19 ENCOUNTER — Ambulatory Visit (INDEPENDENT_AMBULATORY_CARE_PROVIDER_SITE_OTHER): Admitting: Internal Medicine

## 2023-09-19 ENCOUNTER — Encounter: Payer: Self-pay | Admitting: Internal Medicine

## 2023-09-19 VITALS — BP 160/120 | HR 105 | Temp 98.2°F | Ht 64.0 in | Wt 152.0 lb

## 2023-09-19 DIAGNOSIS — I214 Non-ST elevation (NSTEMI) myocardial infarction: Secondary | ICD-10-CM

## 2023-09-19 DIAGNOSIS — G894 Chronic pain syndrome: Secondary | ICD-10-CM

## 2023-09-19 DIAGNOSIS — N184 Chronic kidney disease, stage 4 (severe): Secondary | ICD-10-CM

## 2023-09-19 DIAGNOSIS — Z8679 Personal history of other diseases of the circulatory system: Secondary | ICD-10-CM

## 2023-09-19 DIAGNOSIS — I2510 Atherosclerotic heart disease of native coronary artery without angina pectoris: Secondary | ICD-10-CM | POA: Diagnosis not present

## 2023-09-19 DIAGNOSIS — F331 Major depressive disorder, recurrent, moderate: Secondary | ICD-10-CM

## 2023-09-19 DIAGNOSIS — E785 Hyperlipidemia, unspecified: Secondary | ICD-10-CM

## 2023-09-19 LAB — CBC WITH DIFFERENTIAL/PLATELET
Basophils Absolute: 0.1 10*3/uL (ref 0.0–0.1)
Basophils Relative: 0.5 % (ref 0.0–3.0)
Eosinophils Absolute: 0.2 10*3/uL (ref 0.0–0.7)
Eosinophils Relative: 2.2 % (ref 0.0–5.0)
HCT: 46 % (ref 36.0–46.0)
Hemoglobin: 15.1 g/dL — ABNORMAL HIGH (ref 12.0–15.0)
Lymphocytes Relative: 18.7 % (ref 12.0–46.0)
Lymphs Abs: 2 10*3/uL (ref 0.7–4.0)
MCHC: 32.8 g/dL (ref 30.0–36.0)
MCV: 93.4 fl (ref 78.0–100.0)
Monocytes Absolute: 0.8 10*3/uL (ref 0.1–1.0)
Monocytes Relative: 7.9 % (ref 3.0–12.0)
Neutro Abs: 7.5 10*3/uL (ref 1.4–7.7)
Neutrophils Relative %: 70.7 % (ref 43.0–77.0)
Platelets: 394 10*3/uL (ref 150.0–400.0)
RBC: 4.93 Mil/uL (ref 3.87–5.11)
RDW: 13.9 % (ref 11.5–15.5)
WBC: 10.6 10*3/uL — ABNORMAL HIGH (ref 4.0–10.5)

## 2023-09-19 MED ORDER — TRAMADOL HCL ER 200 MG PO TB24: 200.0000 mg | ORAL_TABLET | Freq: Every morning | ORAL | 3 refills | Status: DC

## 2023-09-19 MED ORDER — TRAMADOL HCL ER 100 MG PO TB24: 100.0000 mg | ORAL_TABLET | Freq: Every day | ORAL | 3 refills | Status: DC

## 2023-09-19 NOTE — Progress Notes (Signed)
 Subjective:  Patient ID: Hayley Miller, female    DOB: 15-Jan-1961  Age: 63 y.o. MRN: [id number]  CC: Medical Management of Chronic Issues (3 mnth f/u)   HPI [name] presents for LBP, CAD, HTN  Outpatient Medications Prior to Visit  Medication Sig Dispense Refill   acetaminophen  (TYLENOL ) 500 MG tablet Take 500 mg by mouth every 6 (six) hours as needed.     albuterol  (VENTOLIN  HFA) 108 (90 Base) MCG/ACT inhaler Inhale 2 puffs into the lungs every 4 (four) hours as needed for wheezing or shortness of breath. 8 g 5   aspirin  EC 81 MG tablet Take 81 mg by mouth daily.     azelastine  (ASTELIN ) 0.1 % nasal spray Place 1 spray into both nostrils 2 (two) times daily. Use in each nostril as directed 30 mL 5   carvedilol  (COREG ) 25 MG tablet Take 1 tablet (25 mg total) by mouth 2 (two) times daily. 60 tablet 11   cholecalciferol (VITAMIN D3) 25 MCG (1000 UNIT) tablet Take 1,000 Units by mouth daily.     cyclobenzaprine  (FLEXERIL ) 5 MG tablet TAKE ONE TABLET BY MOUTH AT BEDTIME 30 tablet 2   esomeprazole  (NEXIUM ) 40 MG capsule Take 1 capsule (40 mg total) by mouth daily. 90 capsule 1   estradiol  (ESTRACE ) 0.5 MG tablet Take 1 tablet (0.5 mg total) by mouth daily. 30 tablet 3   Fezolinetant  (VEOZAH ) 45 MG TABS Take 1 tablet (45 mg total) by mouth daily. 30 tablet 5   hydrALAZINE  (APRESOLINE ) 100 MG tablet Take 1 tablet (100 mg total) by mouth 2 (two) times daily. 60 tablet 5   hydrochlorothiazide  (HYDRODIURIL ) 25 MG tablet Take 1 tablet (25 mg total) by mouth daily. 30 tablet 2   Multiple Vitamin (MULTIVITAMIN WITH MINERALS) TABS tablet Take 1 tablet by mouth daily.     nitroGLYCERIN  (NITROSTAT ) 0.4 MG SL tablet Place 1 tablet (0.4 mg total) under the tongue every 5 (five) minutes as needed for chest pain. 20 tablet 2   Tetrahydrozoline HCl (VISINE OP) Place 1 drop into both eyes daily as needed (dry eyes).     traMADol  (ULTRAM -ER) 100 MG 24 hr tablet Take 1 tablet (100 mg total) by  mouth at bedtime. 30 tablet 2   traMADol  (ULTRAM -ER) 200 MG 24 hr tablet Take 1 tablet (200 mg total) by mouth every morning. 30 tablet 3   No facility-administered medications prior to visit.    ROS: Review of Systems  Constitutional:  Positive for fatigue. Negative for activity change, appetite change, chills and unexpected weight change.  HENT:  Negative for congestion, mouth sores and sinus pressure.   Eyes:  Negative for visual disturbance.  Respiratory:  Negative for cough and chest tightness.   Gastrointestinal:  Negative for abdominal pain and nausea.  Genitourinary:  Negative for difficulty urinating, frequency and vaginal pain.  Musculoskeletal:  Positive for back pain. Negative for gait problem.  Skin:  Negative for pallor and rash.  Neurological:  Negative for dizziness, tremors, weakness, numbness and headaches.  Psychiatric/Behavioral:  Positive for dysphoric mood. Negative for confusion, sleep disturbance and suicidal ideas. The patient is nervous/anxious.     Objective:  BP (!) 160/120   Pulse (!) 105   Temp 98.2 F (36.8 C) (Oral)   Ht 5' 4 (1.626 m)   Wt 152 lb (68.9 kg)   LMP 06/07/2012   SpO2 93%   BMI 26.09 kg/m   BP Readings from Last 3 Encounters:  09/19/23 ROLLEN)  160/120  05/20/23 138/68  04/14/23 130/78    Wt Readings from Last 3 Encounters:  09/19/23 152 lb (68.9 kg)  05/20/23 152 lb 9.6 oz (69.2 kg)  04/14/23 155 lb (70.3 kg)    Physical Exam Constitutional:      General: She is not in acute distress.    Appearance: She is well-developed. She is obese.  HENT:     Head: Normocephalic.     Right Ear: External ear normal.     Left Ear: External ear normal.     Nose: Nose normal.   Eyes:     General:        Right eye: No discharge.        Left eye: No discharge.     Conjunctiva/sclera: Conjunctivae normal.     Pupils: Pupils are equal, round, and reactive to light.   Neck:     Thyroid : No thyromegaly.     Vascular: No JVD.      Trachea: No tracheal deviation.   Cardiovascular:     Rate and Rhythm: Normal rate and regular rhythm.     Heart sounds: Normal heart sounds.  Pulmonary:     Effort: No respiratory distress.     Breath sounds: No stridor. No wheezing.  Abdominal:     General: Bowel sounds are normal. There is no distension.     Palpations: Abdomen is soft. There is no mass.     Tenderness: There is no abdominal tenderness. There is no guarding or rebound.   Musculoskeletal:        General: Tenderness present.     Cervical back: Normal range of motion and neck supple. No rigidity.  Lymphadenopathy:     Cervical: No cervical adenopathy.   Skin:    Findings: No erythema or rash.   Neurological:     Mental Status: She is oriented to person, place, and time.     Cranial Nerves: No cranial nerve deficit.     Motor: No abnormal muscle tone.     Coordination: Coordination normal.     Gait: Gait abnormal.     Deep Tendon Reflexes: Reflexes normal.   Psychiatric:        Behavior: Behavior normal.        Thought Content: Thought content normal.        Judgment: Judgment normal.   LS w/pain  Lab Results  Component Value Date   WBC 12.4 (H) 12/25/2022   HGB 12.0 12/25/2022   HCT 37.8 12/25/2022   PLT 340 12/25/2022   GLUCOSE 107 (H) 01/11/2023   CHOL 255 (H) 03/26/2022   TRIG 232.0 (H) 03/26/2022   HDL 46.60 03/26/2022   LDLDIRECT 148.0 03/26/2022   LDLCALC 122 (H) 02/11/2021   ALT 20 01/11/2023   AST 19 01/11/2023   NA 143 01/11/2023   K 3.9 01/11/2023   CL 102 01/11/2023   CREATININE 1.49 (H) 01/11/2023   BUN 13 01/11/2023   CO2 24 01/11/2023   TSH 1.99 08/12/2022   INR 1.1 12/23/2022   HGBA1C 5.5 06/18/2021    ECHOCARDIOGRAM COMPLETE Result Date: 12/24/2022    ECHOCARDIOGRAM REPORT   Patient Name:   [facility] Date of Exam: 12/24/2022 Medical Rec #:  [id number]         Height:       64.0 in Accession #:    [id number]        Weight:       156.0 lb Date of Birth:  March 08, 1961  BSA:          1.760 m Patient Age:    62 years          BP:           162/97 mmHg Patient Gender: F                 HR:           83 bpm. Exam Location:  Inpatient Procedure: 2D Echo, Cardiac Doppler and Color Doppler Indications:    Syncope R55                 Pericardial effusion I31.3                 NSTEMI I21.4  History:        Patient has prior history of Echocardiogram examinations, most                 recent 12/15/2018. CAD and Previous Myocardial Infarction; Risk                 Factors:Former Smoker and Hypertension.  Sonographer:    Jayson Gaskins Referring Phys: [id number] SUBRINA SUNDIL IMPRESSIONS  1. Left ventricular ejection fraction, by estimation, is 40 to 45%. The left ventricle has mildly decreased function. The left ventricle demonstrates regional wall motion abnormalities (see scoring diagram/findings for description). There is mild left ventricular hypertrophy of the septal segment. Left ventricular diastolic parameters are consistent with Grade I diastolic dysfunction (impaired relaxation). Elevated left ventricular end-diastolic pressure.  2. Right ventricular systolic function is normal. The right ventricular size is normal. There is normal pulmonary artery systolic pressure.  3. A small pericardial effusion is present. The pericardial effusion is anterior to the right ventricle. There is no evidence of cardiac tamponade.  4. The mitral valve is normal in structure. Mild mitral valve regurgitation. No evidence of mitral stenosis.  5. The aortic valve is normal in structure. Aortic valve regurgitation is trivial. No aortic stenosis is present.  6. The inferior vena cava is normal in size with greater than 50% respiratory variability, suggesting right atrial pressure of 3 mmHg. FINDINGS  Left Ventricle: Left ventricular ejection fraction, by estimation, is 40 to 45%. The left ventricle has mildly decreased function. The left ventricle demonstrates regional wall motion abnormalities. The left  ventricular internal cavity size was normal in size. There is mild left ventricular hypertrophy of the septal segment. Left ventricular diastolic parameters are consistent with Grade I diastolic dysfunction (impaired relaxation). Elevated left ventricular end-diastolic pressure.  LV Wall Scoring: The entire anterior wall, inferior septum, inferior wall, posterior wall, and basal anteroseptal segment are hypokinetic. The antero-lateral wall, mid anteroseptal segment, apical lateral segment, apical inferior segment, and apex are normal. Right Ventricle: The right ventricular size is normal. No increase in right ventricular wall thickness. Right ventricular systolic function is normal. There is normal pulmonary artery systolic pressure. The tricuspid regurgitant velocity is 2.52 m/s, and  with an assumed right atrial pressure of 3 mmHg, the estimated right ventricular systolic pressure is 28.4 mmHg. Left Atrium: Left atrial size was normal in size. Right Atrium: Right atrial size was normal in size. Pericardium: A small pericardial effusion is present. The pericardial effusion is anterior to the right ventricle. There is no evidence of cardiac tamponade. Mitral Valve: The mitral valve is normal in structure. Mild mitral valve regurgitation. No evidence of mitral valve stenosis. Tricuspid Valve: The tricuspid valve is normal in structure. Tricuspid valve regurgitation is trivial.  No evidence of tricuspid stenosis. Aortic Valve: The aortic valve is normal in structure. Aortic valve regurgitation is trivial. No aortic stenosis is present. Aortic valve mean gradient measures 4.0 mmHg. Aortic valve peak gradient measures 6.7 mmHg. Aortic valve area, by VTI measures 1.94 cm. Pulmonic Valve: The pulmonic valve was normal in structure. Pulmonic valve regurgitation is not visualized. No evidence of pulmonic stenosis. Aorta: The aortic root is normal in size and structure. Venous: The inferior vena cava is normal in size with  greater than 50% respiratory variability, suggesting right atrial pressure of 3 mmHg. IAS/Shunts: No atrial level shunt detected by color flow Doppler.  LEFT VENTRICLE PLAX 2D LVIDd:         3.50 cm   Diastology LVIDs:         2.40 cm   LV e' medial:    3.48 cm/s LV PW:         0.90 cm   LV E/e' medial:  19.6 LV IVS:        1.10 cm   LV e' lateral:   3.59 cm/s LVOT diam:     1.90 cm   LV E/e' lateral: 19.0 LV SV:         55 LV SV Index:   31 LVOT Area:     2.84 cm  LEFT ATRIUM             Index        RIGHT ATRIUM           Index LA Vol (A2C):   46.0 ml 26.13 ml/m  RA Area:     12.70 cm LA Vol (A4C):   47.0 ml 26.70 ml/m  RA Volume:   30.00 ml  17.04 ml/m LA Biplane Vol: 46.7 ml 26.53 ml/m  AORTIC VALVE AV Area (Vmax):    2.22 cm AV Area (Vmean):   2.14 cm AV Area (VTI):     1.94 cm AV Vmax:           129.00 cm/s AV Vmean:          101.000 cm/s AV VTI:            0.285 m AV Peak Grad:      6.7 mmHg AV Mean Grad:      4.0 mmHg LVOT Vmax:         101.00 cm/s LVOT Vmean:        76.400 cm/s LVOT VTI:          0.195 m LVOT/AV VTI ratio: 0.68  AORTA Ao Asc diam: 3.80 cm MITRAL VALVE                TRICUSPID VALVE MV Area (PHT): 6.07 cm     TR Peak grad:   25.4 mmHg MV Decel Time: 125 msec     TR Vmax:        252.00 cm/s MV E velocity: 68.10 cm/s MV A velocity: 123.00 cm/s  SHUNTS MV E/A ratio:  0.55         Systemic VTI:  0.20 m                             Systemic Diam: 1.90 cm Annabella Scarce MD Electronically signed by Annabella Scarce MD Signature Date/Time: 12/24/2022/12:18:38 PM    Final    CT HEAD WO CONTRAST ( ) Result Date: 12/24/2022 CLINICAL DATA:  Near syncope. EXAM: CT HEAD WITHOUT CONTRAST TECHNIQUE: Contiguous  axial images were obtained from the base of the skull through the vertex without intravenous contrast. RADIATION DOSE REDUCTION: This exam was performed according to the departmental dose-optimization program which includes automated exposure control, adjustment of the mA and/or kV  according to patient size and/or use of iterative reconstruction technique. COMPARISON:  July 18, 2017 FINDINGS: Brain: No evidence of acute infarction, hemorrhage, hydrocephalus, extra-axial collection or mass lesion/mass effect. Mild areas of decreased attenuation are seen within the white matter tracts of the supratentorial brain, consistent with microvascular disease changes. Vascular: No hyperdense vessel or unexpected calcification. Skull: Normal. Negative for fracture or focal lesion. Sinuses/Orbits: No acute finding. Other: None. IMPRESSION: Mild, chronic white matter small vessel ischemic changes without evidence of an acute intracranial abnormality. Electronically Signed   By: Suzen Dials M.D.   On: 12/24/2022 00:05   DG Chest Port 1 View Result Date: 12/23/2022 CLINICAL DATA:  Shortness of breath EXAM: PORTABLE CHEST 1 VIEW COMPARISON:  12/14/2018 FINDINGS: Borderline cardiomegaly. No acute airspace disease or effusion. No pneumothorax. IMPRESSION: No active disease. Electronically Signed   By: Luke Bun M.D.   On: 12/23/2022 20:08   CT Angio Chest/Abd/Pel for Dissection W and/or Wo Contrast Result Date: 12/23/2022 CLINICAL DATA:  Nausea and dizziness for a few hours. EXAM: CT ANGIOGRAPHY CHEST, ABDOMEN AND PELVIS TECHNIQUE: Non-contrast CT of the chest was initially obtained. Multidetector CT imaging through the chest, abdomen and pelvis was performed using the standard protocol during bolus administration of intravenous contrast. Multiplanar reconstructed images and MIPs were obtained and reviewed to evaluate the vascular anatomy. RADIATION DOSE REDUCTION: This exam was performed according to the departmental dose-optimization program which includes automated exposure control, adjustment of the mA and/or kV according to patient size and/or use of iterative reconstruction technique. CONTRAST:  OMNIPAQUE  IOHEXOL  350 MG/ML SOLN COMPARISON:  Radiograph 12/14/2018 and CTA chest abdomen  and pelvis 07/18/2017 FINDINGS: CTA CHEST FINDINGS Cardiovascular: No intramural hematoma, aneurysm, or dissection in the thoracic aorta. No pericardial effusion. No central pulmonary embolism Mediastinum/Nodes: Small hiatal hernia. Trachea is unremarkable. No thoracic adenopathy. Lungs/Pleura: Bilateral lower lobe scarring and atelectasis with mild bronchiolectasis likely postinfectious. No pleural effusion or pneumothorax Musculoskeletal: No acute fracture. Review of the MIP images confirms the above findings. CTA ABDOMEN AND PELVIS FINDINGS VASCULAR Mild scattered calcified atherosclerotic plaque without hemodynamically significant stenosis. No aortic aneurysm or dissection. The mesenteric and renal arteries are widely patent. Widely patent inflow and outflow arteries bilaterally. Review of the MIP images confirms the above findings. NON-VASCULAR Hepatobiliary: No focal liver abnormality is seen. No gallstones, gallbladder wall thickening, or biliary dilatation. Pancreas: Unremarkable. Spleen: Unremarkable. Adrenals/Urinary Tract: Stable adrenal glands. Bilateral nonobstructing nephrolithiasis. No hydronephrosis. Unremarkable bladder. Stomach/Bowel: Normal caliber large and small bowel. No bowel wall thickening. Colonic diverticulosis without diverticulitis. Normal appendix. Stomach is within normal limits. Lymphatic: No lymphadenopathy. Reproductive: Hysterectomy. Other: No free intraperitoneal fluid or air. Musculoskeletal: No acute fracture. Review of the MIP images confirms the above findings. IMPRESSION: 1. No acute aortic syndrome. 2. No acute findings in the chest, abdomen, or pelvis. 3. Bilateral nonobstructing nephrolithiasis. 4. Colonic diverticulosis without diverticulitis. Electronically Signed   By: Norman Gatlin M.D.   On: 12/23/2022 20:01    Assessment & Plan:   Problem List Items Addressed This Visit     Dyslipidemia   On Crestor       Depression   Discussed       Chronic pain  syndrome   LBP On Tramadol  XR  Potential benefits  of a long term opioids use as well as potential risks (i.e. addiction risk, apnea etc) and complications (i.e. Somnolence, constipation and others) were explained to the patient and were aknowledged.       Relevant Medications   traMADol  (ULTRAM -ER) 100 MG 24 hr tablet   traMADol  (ULTRAM -ER) 200 MG 24 hr tablet   NSTEMI (non-ST elevated myocardial infarction) (HCC)   CAD (coronary artery disease) - Primary (Chronic)   Chronic Dr Okey ASA, NTG prn, Pravachol   Potential benefits of a long term HRT use in pt's w/CAD as well as potential risks  and complications were explained to the patient and were aknowledged.      CKD (chronic kidney disease) stage 4, GFR 15-29 ml/min (HCC)   We will monitor GFR. Hydrate well      History of CAD (coronary artery disease)   No CP         Meds ordered this encounter  Medications   traMADol  (ULTRAM -ER) 100 MG 24 hr tablet    Sig: Take 1 tablet (100 mg total) by mouth at bedtime.    Dispense:  30 tablet    Refill:  3   traMADol  (ULTRAM -ER) 200 MG 24 hr tablet    Sig: Take 1 tablet (200 mg total) by mouth every morning.    Dispense:  30 tablet    Refill:  3      Follow-up: No follow-ups on file.  Marolyn Noel, MD

## 2023-09-19 NOTE — Assessment & Plan Note (Signed)
 Chronic Dr Tenny Craw ASA, NTG prn, Pravachol  Potential benefits of a long term HRT use in pt's w/CAD as well as potential risks  and complications were explained to the patient and were aknowledged.

## 2023-09-19 NOTE — Assessment & Plan Note (Signed)
 No CP

## 2023-09-19 NOTE — Assessment & Plan Note (Signed)
 On Crestor

## 2023-09-19 NOTE — Assessment & Plan Note (Signed)
We will monitor GFR. Hydrate well

## 2023-09-19 NOTE — Assessment & Plan Note (Signed)
 LBP On Tramadol XR  Potential benefits of a long term opioids use as well as potential risks (i.e. addiction risk, apnea etc) and complications (i.e. Somnolence, constipation and others) were explained to the patient and were aknowledged.

## 2023-09-19 NOTE — Assessment & Plan Note (Signed)
 Discussed.

## 2023-09-20 LAB — COMPREHENSIVE METABOLIC PANEL WITH GFR
ALT: 11 U/L (ref 0–35)
AST: 17 U/L (ref 0–37)
Albumin: 4.6 g/dL (ref 3.5–5.2)
Alkaline Phosphatase: 75 U/L (ref 39–117)
BUN: 34 mg/dL — ABNORMAL HIGH (ref 6–23)
CO2: 27 meq/L (ref 19–32)
Calcium: 11.3 mg/dL — ABNORMAL HIGH (ref 8.4–10.5)
Chloride: 102 meq/L (ref 96–112)
Creatinine, Ser: 1.71 mg/dL — ABNORMAL HIGH (ref 0.40–1.20)
GFR: 31.96 mL/min — ABNORMAL LOW (ref >60.00)
Glucose, Bld: 109 mg/dL — ABNORMAL HIGH (ref 70–99)
Potassium: 4.3 meq/L (ref 3.5–5.1)
Sodium: 141 meq/L (ref 135–145)
Total Bilirubin: 0.3 mg/dL (ref 0.2–1.2)
Total Protein: 8.1 g/dL (ref 6.0–8.3)

## 2023-09-21 ENCOUNTER — Ambulatory Visit: Payer: Self-pay | Admitting: Internal Medicine

## 2023-10-08 DIAGNOSIS — G4733 Obstructive sleep apnea (adult) (pediatric): Secondary | ICD-10-CM | POA: Diagnosis not present

## 2023-10-28 ENCOUNTER — Other Ambulatory Visit: Payer: Self-pay | Admitting: Internal Medicine

## 2023-11-01 ENCOUNTER — Ambulatory Visit: Admitting: Internal Medicine

## 2023-11-01 ENCOUNTER — Encounter: Payer: Self-pay | Admitting: Internal Medicine

## 2023-11-01 VITALS — BP 155/100 | HR 100 | Temp 97.7°F | Ht 64.0 in | Wt 152.0 lb

## 2023-11-01 DIAGNOSIS — N951 Menopausal and female climacteric states: Secondary | ICD-10-CM | POA: Diagnosis not present

## 2023-11-01 DIAGNOSIS — J0111 Acute recurrent frontal sinusitis: Secondary | ICD-10-CM

## 2023-11-01 DIAGNOSIS — I2510 Atherosclerotic heart disease of native coronary artery without angina pectoris: Secondary | ICD-10-CM

## 2023-11-01 DIAGNOSIS — K061 Gingival enlargement: Secondary | ICD-10-CM

## 2023-11-01 DIAGNOSIS — I10 Essential (primary) hypertension: Secondary | ICD-10-CM

## 2023-11-01 DIAGNOSIS — J449 Chronic obstructive pulmonary disease, unspecified: Secondary | ICD-10-CM

## 2023-11-01 DIAGNOSIS — N184 Chronic kidney disease, stage 4 (severe): Secondary | ICD-10-CM | POA: Diagnosis not present

## 2023-11-01 MED ORDER — CEFUROXIME AXETIL 250 MG PO TABS: 250.0000 mg | ORAL_TABLET | Freq: Two times a day (BID) | ORAL | 0 refills | Status: AC

## 2023-11-01 MED ORDER — TRAMADOL HCL ER 200 MG PO TB24
200.0000 mg | ORAL_TABLET | Freq: Every morning | ORAL | 3 refills | Status: DC
Start: 2023-11-01 — End: 2024-01-03

## 2023-11-01 MED ORDER — TRAMADOL HCL ER 100 MG PO TB24: 100.0000 mg | ORAL_TABLET | Freq: Every day | ORAL | 3 refills | Status: DC

## 2023-11-01 MED ORDER — VERAPAMIL HCL ER 240 MG PO TBCR: 240.0000 mg | EXTENDED_RELEASE_TABLET | Freq: Every day | ORAL | 11 refills | Status: DC

## 2023-11-01 MED ORDER — ESTRADIOL 1 MG PO TABS: 1.0000 mg | ORAL_TABLET | Freq: Every day | ORAL | 5 refills | Status: AC

## 2023-11-01 MED ORDER — FLUCONAZOLE 150 MG PO TABS: 150.0000 mg | ORAL_TABLET | Freq: Once | ORAL | 1 refills | Status: AC

## 2023-11-01 MED ORDER — METHYLPREDNISOLONE 4 MG PO TBPK
ORAL_TABLET | ORAL | 0 refills | Status: DC
Start: 2023-11-01 — End: 2024-01-03

## 2023-11-01 NOTE — Progress Notes (Signed)
 Subjective:  Patient ID: Hayley Miller, female    DOB: [birth year]  Age: 63 y.o. MRN: [id number]  CC: Medical Management of Chronic Issues (6 WEEK f/u, Pt states she feels fatigue, Elevated BP and headaches... Pt states she believes she has been fighting  sinus infection that is not clearing up with OTC meds.)   HPI Hayley Miller presents for sinusitis sx's x 3-4 d. Coloured nasal d/c... F/u CAD, HTN, LBP  Outpatient Medications Prior to Visit  Medication Sig Dispense Refill   acetaminophen  (TYLENOL ) 500 MG tablet Take 500 mg by mouth every 6 (six) hours as needed.     albuterol  (VENTOLIN  HFA) 108 (90 Base) MCG/ACT inhaler Inhale 2 puffs into the lungs every 4 (four) hours as needed for wheezing or shortness of breath. 8 g 5   aspirin  EC 81 MG tablet Take 81 mg by mouth daily.     azelastine  (ASTELIN ) 0.1 % nasal spray Place 1 spray into both nostrils 2 (two) times daily. Use in each nostril as directed 30 mL 5   carvedilol  (COREG ) 25 MG tablet Take 1 tablet (25 mg total) by mouth 2 (two) times daily. 60 tablet 5   cholecalciferol (VITAMIN D3) 25 MCG (1000 UNIT) tablet Take 1,000 Units by mouth daily.     cyclobenzaprine  (FLEXERIL ) 5 MG tablet TAKE ONE TABLET BY MOUTH AT BEDTIME 30 tablet 2   esomeprazole  (NEXIUM ) 40 MG capsule Take 1 capsule (40 mg total) by mouth daily. 90 capsule 5   hydrALAZINE  (APRESOLINE ) 100 MG tablet TAKE ONE TABLET BY MOUTH TWICE DAILY 60 tablet 5   hydrochlorothiazide  (HYDRODIURIL ) 25 MG tablet Take 1 tablet (25 mg total) by mouth daily. 30 tablet 2   Multiple Vitamin (MULTIVITAMIN WITH MINERALS) TABS tablet Take 1 tablet by mouth daily.     nitroGLYCERIN  (NITROSTAT ) 0.4 MG SL tablet Place 1 tablet (0.4 mg total) under the tongue every 5 (five) minutes as needed for chest pain. 20 tablet 2   Tetrahydrozoline HCl (VISINE OP) Place 1 drop into both eyes daily as needed (dry eyes).     estradiol  (ESTRACE ) 0.5 MG tablet Take 1 tablet (0.5 mg total) by mouth  daily. 30 tablet 3   Fezolinetant  (VEOZAH ) 45 MG TABS Take 1 tablet (45 mg total) by mouth daily. 30 tablet 5   traMADol  (ULTRAM -ER) 100 MG 24 hr tablet Take 1 tablet (100 mg total) by mouth at bedtime. 30 tablet 3   traMADol  (ULTRAM -ER) 200 MG 24 hr tablet Take 1 tablet (200 mg total) by mouth every morning. 30 tablet 3   No facility-administered medications prior to visit.    ROS: Review of Systems  Constitutional:  Positive for chills and fatigue. Negative for activity change, appetite change, fever and unexpected weight change.  HENT:  Positive for congestion, postnasal drip, rhinorrhea, sinus pressure, sinus pain and voice change. Negative for mouth sores.   Eyes:  Negative for visual disturbance.  Respiratory:  Positive for cough. Negative for chest tightness.   Gastrointestinal:  Negative for abdominal pain and nausea.  Genitourinary:  Negative for difficulty urinating, frequency and vaginal pain.  Musculoskeletal:  Negative for back pain and gait problem.  Skin:  Negative for pallor and rash.  Neurological:  Negative for dizziness, tremors, weakness, numbness and headaches.  Psychiatric/Behavioral:  Negative for confusion, sleep disturbance and suicidal ideas.     Objective:  BP (!) 155/100   Pulse 100   Temp 97.7 F (36.5 C) (Oral)   Ht 5'  4 (1.626 m)   Wt 152 lb (68.9 kg)   LMP 06/07/2012   SpO2 98%   BMI 26.09 kg/m   BP Readings from Last 3 Encounters:  11/01/23 (!) 155/100  09/19/23 (!) 160/120  05/20/23 138/68    Wt Readings from Last 3 Encounters:  11/01/23 152 lb (68.9 kg)  09/19/23 152 lb (68.9 kg)  05/20/23 152 lb 9.6 oz (69.2 kg)    Physical Exam Constitutional:      General: She is not in acute distress.    Appearance: She is well-developed. She is not ill-appearing or toxic-appearing.  HENT:     Head: Normocephalic.     Right Ear: External ear normal.     Left Ear: External ear normal.     Nose: Congestion and rhinorrhea present.  Eyes:      General:        Right eye: No discharge.        Left eye: No discharge.     Conjunctiva/sclera: Conjunctivae normal.     Pupils: Pupils are equal, round, and reactive to light.  Neck:     Thyroid : No thyromegaly.     Vascular: No JVD.     Trachea: No tracheal deviation.  Cardiovascular:     Rate and Rhythm: Normal rate and regular rhythm.     Heart sounds: Normal heart sounds.  Pulmonary:     Effort: No respiratory distress.     Breath sounds: No stridor. No wheezing.  Abdominal:     General: Bowel sounds are normal. There is no distension.     Palpations: Abdomen is soft. There is no mass.     Tenderness: There is no abdominal tenderness. There is no guarding or rebound.  Musculoskeletal:        General: No tenderness.     Cervical back: Normal range of motion and neck supple. No rigidity.  Lymphadenopathy:     Cervical: No cervical adenopathy.  Skin:    Findings: No erythema or rash.  Neurological:     Mental Status: She is oriented to person, place, and time.     Cranial Nerves: No cranial nerve deficit.     Motor: No abnormal muscle tone.     Coordination: Coordination normal.     Deep Tendon Reflexes: Reflexes normal.  Psychiatric:        Behavior: Behavior normal.        Thought Content: Thought content normal.        Judgment: Judgment normal.   Looks tired Tachycardic Gingival hyperplasia  Lab Results  Component Value Date   WBC 10.6 (H) 09/19/2023   HGB 15.1 (H) 09/19/2023   HCT 46.0 09/19/2023   PLT 394.0 09/19/2023   GLUCOSE 109 (H) 09/19/2023   CHOL 255 (H) 03/26/2022   TRIG 232.0 (H) 03/26/2022   HDL 46.60 03/26/2022   LDLDIRECT 148.0 03/26/2022   LDLCALC 122 (H) 02/11/2021   ALT 11 09/19/2023   AST 17 09/19/2023   NA 141 09/19/2023   K 4.3 09/19/2023   CL 102 09/19/2023   CREATININE 1.71 (H) 09/19/2023   BUN 34 (H) 09/19/2023   CO2 27 09/19/2023   TSH 1.99 08/12/2022   INR 1.1 12/23/2022   HGBA1C 5.5 06/18/2021    ECHOCARDIOGRAM  COMPLETE Result Date: 12/24/2022    ECHOCARDIOGRAM REPORT   Patient Name:   [name] Date of Exam: 12/24/2022 Medical Rec #:  [id number]         Height:  64.0 in Accession #:    [id number]        Weight:       156.0 lb Date of Birth:  [birth year]         BSA:          1.760 m Patient Age:    62 years          BP:           162/97 mmHg Patient Gender: F                 HR:           83 bpm. Exam Location:  Inpatient Procedure: 2D Echo, Cardiac Doppler and Color Doppler Indications:    Syncope R55                 Pericardial effusion I31.3                 NSTEMI I21.4  History:        Patient has prior history of Echocardiogram examinations, most                 recent 12/15/2018. CAD and Previous Myocardial Infarction; Risk                 Factors:Former Smoker and Hypertension.  Sonographer:    Jayson Gaskins Referring Phys: [id number] SUBRINA SUNDIL IMPRESSIONS  1. Left ventricular ejection fraction, by estimation, is 40 to 45%. The left ventricle has mildly decreased function. The left ventricle demonstrates regional wall motion abnormalities (see scoring diagram/findings for description). There is mild left ventricular hypertrophy of the septal segment. Left ventricular diastolic parameters are consistent with Grade I diastolic dysfunction (impaired relaxation). Elevated left ventricular end-diastolic pressure.  2. Right ventricular systolic function is normal. The right ventricular size is normal. There is normal pulmonary artery systolic pressure.  3. A small pericardial effusion is present. The pericardial effusion is anterior to the right ventricle. There is no evidence of cardiac tamponade.  4. The mitral valve is normal in structure. Mild mitral valve regurgitation. No evidence of mitral stenosis.  5. The aortic valve is normal in structure. Aortic valve regurgitation is trivial. No aortic stenosis is present.  6. The inferior vena cava is normal in size with greater than 50% respiratory variability,  suggesting right atrial pressure of 3 mmHg. FINDINGS  Left Ventricle: Left ventricular ejection fraction, by estimation, is 40 to 45%. The left ventricle has mildly decreased function. The left ventricle demonstrates regional wall motion abnormalities. The left ventricular internal cavity size was normal in size. There is mild left ventricular hypertrophy of the septal segment. Left ventricular diastolic parameters are consistent with Grade I diastolic dysfunction (impaired relaxation). Elevated left ventricular end-diastolic pressure.  LV Wall Scoring: The entire anterior wall, inferior septum, inferior wall, posterior wall, and basal anteroseptal segment are hypokinetic. The antero-lateral wall, mid anteroseptal segment, apical lateral segment, apical inferior segment, and apex are normal. Right Ventricle: The right ventricular size is normal. No increase in right ventricular wall thickness. Right ventricular systolic function is normal. There is normal pulmonary artery systolic pressure. The tricuspid regurgitant velocity is 2.52 m/s, and  with an assumed right atrial pressure of 3 mmHg, the estimated right ventricular systolic pressure is 28.4 mmHg. Left Atrium: Left atrial size was normal in size. Right Atrium: Right atrial size was normal in size. Pericardium: A small pericardial effusion is present. The pericardial effusion is anterior to the right ventricle. There is no  evidence of cardiac tamponade. Mitral Valve: The mitral valve is normal in structure. Mild mitral valve regurgitation. No evidence of mitral valve stenosis. Tricuspid Valve: The tricuspid valve is normal in structure. Tricuspid valve regurgitation is trivial. No evidence of tricuspid stenosis. Aortic Valve: The aortic valve is normal in structure. Aortic valve regurgitation is trivial. No aortic stenosis is present. Aortic valve mean gradient measures 4.0 mmHg. Aortic valve peak gradient measures 6.7 mmHg. Aortic valve area, by VTI measures  1.94 cm. Pulmonic Valve: The pulmonic valve was normal in structure. Pulmonic valve regurgitation is not visualized. No evidence of pulmonic stenosis. Aorta: The aortic root is normal in size and structure. Venous: The inferior vena cava is normal in size with greater than 50% respiratory variability, suggesting right atrial pressure of 3 mmHg. IAS/Shunts: No atrial level shunt detected by color flow Doppler.  LEFT VENTRICLE PLAX 2D LVIDd:         3.50 cm   Diastology LVIDs:         2.40 cm   LV e' medial:    3.48 cm/s LV PW:         0.90 cm   LV E/e' medial:  19.6 LV IVS:        1.10 cm   LV e' lateral:   3.59 cm/s LVOT diam:     1.90 cm   LV E/e' lateral: 19.0 LV SV:         55 LV SV Index:   31 LVOT Area:     2.84 cm  LEFT ATRIUM             Index        RIGHT ATRIUM           Index LA Vol (A2C):   46.0 ml 26.13 ml/m  RA Area:     12.70 cm LA Vol (A4C):   47.0 ml 26.70 ml/m  RA Volume:   30.00 ml  17.04 ml/m LA Biplane Vol: 46.7 ml 26.53 ml/m  AORTIC VALVE AV Area (Vmax):    2.22 cm AV Area (Vmean):   2.14 cm AV Area (VTI):     1.94 cm AV Vmax:           129.00 cm/s AV Vmean:          101.000 cm/s AV VTI:            0.285 m AV Peak Grad:      6.7 mmHg AV Mean Grad:      4.0 mmHg LVOT Vmax:         101.00 cm/s LVOT Vmean:        76.400 cm/s LVOT VTI:          0.195 m LVOT/AV VTI ratio: 0.68  AORTA Ao Asc diam: 3.80 cm MITRAL VALVE                TRICUSPID VALVE MV Area (PHT): 6.07 cm     TR Peak grad:   25.4 mmHg MV Decel Time: 125 msec     TR Vmax:        252.00 cm/s MV E velocity: 68.10 cm/s MV A velocity: 123.00 cm/s  SHUNTS MV E/A ratio:  0.55         Systemic VTI:  0.20 m                             Systemic Diam: 1.90 cm Annabella Scarce MD  Electronically signed by Annabella Scarce MD Signature Date/Time: 12/24/2022/12:18:38 PM    Final    CT HEAD WO CONTRAST ( ) Result Date: 12/24/2022 CLINICAL DATA:  Near syncope. EXAM: CT HEAD WITHOUT CONTRAST TECHNIQUE: Contiguous axial images were  obtained from the base of the skull through the vertex without intravenous contrast. RADIATION DOSE REDUCTION: This exam was performed according to the departmental dose-optimization program which includes automated exposure control, adjustment of the mA and/or kV according to patient size and/or use of iterative reconstruction technique. COMPARISON:  July 18, 2017 FINDINGS: Brain: No evidence of acute infarction, hemorrhage, hydrocephalus, extra-axial collection or mass lesion/mass effect. Mild areas of decreased attenuation are seen within the white matter tracts of the supratentorial brain, consistent with microvascular disease changes. Vascular: No hyperdense vessel or unexpected calcification. Skull: Normal. Negative for fracture or focal lesion. Sinuses/Orbits: No acute finding. Other: None. IMPRESSION: Mild, chronic white matter small vessel ischemic changes without evidence of an acute intracranial abnormality. Electronically Signed   By: Suzen Dials M.D.   On: 12/24/2022 00:05   DG Chest Port 1 View Result Date: 12/23/2022 CLINICAL DATA:  Shortness of breath EXAM: PORTABLE CHEST 1 VIEW COMPARISON:  12/14/2018 FINDINGS: Borderline cardiomegaly. No acute airspace disease or effusion. No pneumothorax. IMPRESSION: No active disease. Electronically Signed   By: Luke Bun M.D.   On: 12/23/2022 20:08   CT Angio Chest/Abd/Pel for Dissection W and/or Wo Contrast Result Date: 12/23/2022 CLINICAL DATA:  Nausea and dizziness for a few hours. EXAM: CT ANGIOGRAPHY CHEST, ABDOMEN AND PELVIS TECHNIQUE: Non-contrast CT of the chest was initially obtained. Multidetector CT imaging through the chest, abdomen and pelvis was performed using the standard protocol during bolus administration of intravenous contrast. Multiplanar reconstructed images and MIPs were obtained and reviewed to evaluate the vascular anatomy. RADIATION DOSE REDUCTION: This exam was performed according to the departmental dose-optimization  program which includes automated exposure control, adjustment of the mA and/or kV according to patient size and/or use of iterative reconstruction technique. CONTRAST:  OMNIPAQUE  IOHEXOL  350 MG/ML SOLN COMPARISON:  Radiograph 12/14/2018 and CTA chest abdomen and pelvis 07/18/2017 FINDINGS: CTA CHEST FINDINGS Cardiovascular: No intramural hematoma, aneurysm, or dissection in the thoracic aorta. No pericardial effusion. No central pulmonary embolism Mediastinum/Nodes: Small hiatal hernia. Trachea is unremarkable. No thoracic adenopathy. Lungs/Pleura: Bilateral lower lobe scarring and atelectasis with mild bronchiolectasis likely postinfectious. No pleural effusion or pneumothorax Musculoskeletal: No acute fracture. Review of the MIP images confirms the above findings. CTA ABDOMEN AND PELVIS FINDINGS VASCULAR Mild scattered calcified atherosclerotic plaque without hemodynamically significant stenosis. No aortic aneurysm or dissection. The mesenteric and renal arteries are widely patent. Widely patent inflow and outflow arteries bilaterally. Review of the MIP images confirms the above findings. NON-VASCULAR Hepatobiliary: No focal liver abnormality is seen. No gallstones, gallbladder wall thickening, or biliary dilatation. Pancreas: Unremarkable. Spleen: Unremarkable. Adrenals/Urinary Tract: Stable adrenal glands. Bilateral nonobstructing nephrolithiasis. No hydronephrosis. Unremarkable bladder. Stomach/Bowel: Normal caliber large and small bowel. No bowel wall thickening. Colonic diverticulosis without diverticulitis. Normal appendix. Stomach is within normal limits. Lymphatic: No lymphadenopathy. Reproductive: Hysterectomy. Other: No free intraperitoneal fluid or air. Musculoskeletal: No acute fracture. Review of the MIP images confirms the above findings. IMPRESSION: 1. No acute aortic syndrome. 2. No acute findings in the chest, abdomen, or pelvis. 3. Bilateral nonobstructing nephrolithiasis. 4. Colonic  diverticulosis without diverticulitis. Electronically Signed   By: Norman Gatlin M.D.   On: 12/23/2022 20:01    Assessment & Plan:   Problem List Items Addressed This  Visit     Acute sinusitis - Primary   New Medrol  pack, Ceftin  po      Relevant Medications   cefUROXime  (CEFTIN ) 250 MG tablet   fluconazole  (DIFLUCAN ) 150 MG tablet   methylPREDNISolone  (MEDROL  DOSEPAK) 4 MG TBPK tablet   CAD (coronary artery disease) (Chronic)   Chronic Dr Okey ASA, NTG prn, Pravachol   Potential benefits of a long term HRT use in pt's w/CAD as well as potential risks  and complications were explained to the patient and were aknowledged.      Relevant Medications   verapamil  (CALAN -SR) 240 MG CR tablet   CKD (chronic kidney disease) stage 4, GFR 15-29 ml/min (HCC)   We will monitor GFR. Hydrate well      COPD (chronic obstructive pulmonary disease) (HCC)   Worse Medrol  pack      Relevant Medications   methylPREDNISolone  (MEDROL  DOSEPAK) 4 MG TBPK tablet   Gingival hyperplasia   Will try Verapamil   (Gingival hyperplasia is not better off amlodipine )      Hot flashes due to menopause   Severe hot flashes q 20-30 min, bad at night x weeks. Rx was stopped due to high BP. No h/o DVT. Nonsmoker. Increase Estrace  po to 1 mg/d. Start in 1-2 wks  Potential benefits of a long term HRT use as well as potential risks  and complications were explained to the patient and were aknowledged. Monitor BP at home Will try Verapamil  (Gingival hyperplasia is not better off amlodipine )      Relevant Medications   verapamil  (CALAN -SR) 240 MG CR tablet   Severe uncontrolled hypertension (Chronic)   Will try Verapamil  (Gingival hyperplasia is not better off amlodipine )      Relevant Medications   verapamil  (CALAN -SR) 240 MG CR tablet      Meds ordered this encounter  Medications   estradiol  (ESTRACE ) 1 MG tablet    Sig: Take 1 tablet (1 mg total) by mouth daily.    Dispense:  30 tablet     Refill:  5   traMADol  (ULTRAM -ER) 100 MG 24 hr tablet    Sig: Take 1 tablet (100 mg total) by mouth at bedtime.    Dispense:  30 tablet    Refill:  3   traMADol  (ULTRAM -ER) 200 MG 24 hr tablet    Sig: Take 1 tablet (200 mg total) by mouth every morning.    Dispense:  30 tablet    Refill:  3   cefUROXime  (CEFTIN ) 250 MG tablet    Sig: Take 1 tablet (250 mg total) by mouth 2 (two) times daily with a meal for 10 days.    Dispense:  20 tablet    Refill:  0   fluconazole  (DIFLUCAN ) 150 MG tablet    Sig: Take 1 tablet (150 mg total) by mouth once for 1 dose.    Dispense:  1 tablet    Refill:  1   methylPREDNISolone  (MEDROL  DOSEPAK) 4 MG TBPK tablet    Sig: As directed    Dispense:  21 tablet    Refill:  0   verapamil  (CALAN -SR) 240 MG CR tablet    Sig: Take 1 tablet (240 mg total) by mouth daily.    Dispense:  30 tablet    Refill:  11      Follow-up: Return in about 2 months (around 01/01/2024) for a follow-up visit.  Marolyn Noel, MD

## 2023-11-01 NOTE — Assessment & Plan Note (Signed)
 New Medrol  pack, Ceftin  po

## 2023-11-01 NOTE — Assessment & Plan Note (Signed)
We will monitor GFR. Hydrate well

## 2023-11-01 NOTE — Assessment & Plan Note (Signed)
 Will try Verapamil  (Gingival hyperplasia is not better off amlodipine )

## 2023-11-01 NOTE — Assessment & Plan Note (Signed)
 Chronic Dr Tenny Craw ASA, NTG prn, Pravachol  Potential benefits of a long term HRT use in pt's w/CAD as well as potential risks  and complications were explained to the patient and were aknowledged.

## 2023-11-01 NOTE — Assessment & Plan Note (Signed)
Worse Medrol pack

## 2023-11-01 NOTE — Assessment & Plan Note (Addendum)
 Severe hot flashes q 20-30 min, bad at night x weeks. Rx was stopped due to high BP. No h/o DVT. Nonsmoker. Increase Estrace  po to 1 mg/d. Start in 1-2 wks  Potential benefits of a long term HRT use as well as potential risks  and complications were explained to the patient and were aknowledged. Monitor BP at home Will try Verapamil  (Gingival hyperplasia is not better off amlodipine )

## 2023-11-08 DIAGNOSIS — G4733 Obstructive sleep apnea (adult) (pediatric): Secondary | ICD-10-CM | POA: Diagnosis not present

## 2023-12-05 ENCOUNTER — Telehealth: Payer: Self-pay

## 2023-12-05 NOTE — Telephone Encounter (Signed)
 Copied from CRM [id number]. Topic: General - Other >> Dec 05, 2023 12:57 PM [place] A wrote: Reason for CRM: Shilpi from [facility] pharmacy called to ask patients diagnoses. Please contact at [phone] extension [phone]

## 2023-12-09 NOTE — Telephone Encounter (Signed)
 Returned call to [employer] to get more information about what type of diagnosis was needed. No Answer. Left VM to return call.

## 2024-01-03 ENCOUNTER — Ambulatory Visit: Admitting: Internal Medicine

## 2024-01-03 ENCOUNTER — Encounter: Payer: Self-pay | Admitting: Internal Medicine

## 2024-01-03 VITALS — BP 170/100 | HR 105 | Temp 98.7°F | Ht 64.0 in | Wt 161.6 lb

## 2024-01-03 DIAGNOSIS — I2510 Atherosclerotic heart disease of native coronary artery without angina pectoris: Secondary | ICD-10-CM

## 2024-01-03 DIAGNOSIS — M62838 Other muscle spasm: Secondary | ICD-10-CM

## 2024-01-03 DIAGNOSIS — M545 Low back pain, unspecified: Secondary | ICD-10-CM

## 2024-01-03 DIAGNOSIS — G894 Chronic pain syndrome: Secondary | ICD-10-CM

## 2024-01-03 DIAGNOSIS — G8929 Other chronic pain: Secondary | ICD-10-CM

## 2024-01-03 DIAGNOSIS — R5382 Chronic fatigue, unspecified: Secondary | ICD-10-CM

## 2024-01-03 DIAGNOSIS — N184 Chronic kidney disease, stage 4 (severe): Secondary | ICD-10-CM

## 2024-01-03 MED ORDER — TRAMADOL HCL ER 200 MG PO TB24: 200.0000 mg | ORAL_TABLET | Freq: Every morning | ORAL | 3 refills | Status: DC

## 2024-01-03 MED ORDER — METHYLPREDNISOLONE 4 MG PO TBPK: ORAL_TABLET | ORAL | 0 refills | Status: DC

## 2024-01-03 MED ORDER — CYCLOBENZAPRINE HCL 5 MG PO TABS: 5.0000 mg | ORAL_TABLET | Freq: Every day | ORAL | 2 refills | Status: AC

## 2024-01-03 MED ORDER — TRAMADOL HCL ER 100 MG PO TB24: 100.0000 mg | ORAL_TABLET | Freq: Every day | ORAL | 3 refills | Status: DC

## 2024-01-03 NOTE — Progress Notes (Signed)
 Subjective:  Patient ID: Hayley Miller, female    DOB: 1960/12/22  Age: 63 y.o. MRN: [id number]  CC: Follow-up (States problem lifting left arm. Started for a few days. States she woke up like this. Started in neck, down to should then down to arm. Had this before. States she had to get some medicine for this. Patient asking for refill of Tramadol  to be called in, I informed her she can call the pharmacy and it can be filled. )   HPI Hayley Miller presents for c/o problem lifting left arm. Started for a few days. States she woke up like this. Started in neck, down to should then down to arm. Had this before. States she had to get some medicine for this. Patient asking for refill of Tramadol  to be called in, I informed her she can call the pharmacy and it can be filled. Pain w/ROM.SABRASABRA  F/u on CFS, chronic pain  L handed  Outpatient Medications Prior to Visit  Medication Sig Dispense Refill   acetaminophen  (TYLENOL ) 500 MG tablet Take 500 mg by mouth every 6 (six) hours as needed.     albuterol  (VENTOLIN  HFA) 108 (90 Base) MCG/ACT inhaler Inhale 2 puffs into the lungs every 4 (four) hours as needed for wheezing or shortness of breath. 8 g 5   aspirin  EC 81 MG tablet Take 81 mg by mouth daily.     azelastine  (ASTELIN ) 0.1 % nasal spray Place 1 spray into both nostrils 2 (two) times daily. Use in each nostril as directed 30 mL 5   carvedilol  (COREG ) 25 MG tablet Take 1 tablet (25 mg total) by mouth 2 (two) times daily. 60 tablet 5   cholecalciferol (VITAMIN D3) 25 MCG (1000 UNIT) tablet Take 1,000 Units by mouth daily.     esomeprazole  (NEXIUM ) 40 MG capsule Take 1 capsule (40 mg total) by mouth daily. 90 capsule 5   estradiol  (ESTRACE ) 1 MG tablet Take 1 tablet (1 mg total) by mouth daily. 30 tablet 5   hydrALAZINE  (APRESOLINE ) 100 MG tablet TAKE ONE TABLET BY MOUTH TWICE DAILY 60 tablet 5   hydrochlorothiazide  (HYDRODIURIL ) 25 MG tablet Take 1 tablet (25 mg total) by mouth daily. 30  tablet 2   Multiple Vitamin (MULTIVITAMIN WITH MINERALS) TABS tablet Take 1 tablet by mouth daily.     nitroGLYCERIN  (NITROSTAT ) 0.4 MG SL tablet Place 1 tablet (0.4 mg total) under the tongue every 5 (five) minutes as needed for chest pain. 20 tablet 2   Tetrahydrozoline HCl (VISINE OP) Place 1 drop into both eyes daily as needed (dry eyes).     verapamil  (CALAN -SR) 240 MG CR tablet Take 1 tablet (240 mg total) by mouth daily. 30 tablet 11   cyclobenzaprine  (FLEXERIL ) 5 MG tablet TAKE ONE TABLET BY MOUTH AT BEDTIME 30 tablet 2   methylPREDNISolone  (MEDROL  DOSEPAK) 4 MG TBPK tablet As directed 21 tablet 0   traMADol  (ULTRAM -ER) 100 MG 24 hr tablet Take 1 tablet (100 mg total) by mouth at bedtime. 30 tablet 3   traMADol  (ULTRAM -ER) 200 MG 24 hr tablet Take 1 tablet (200 mg total) by mouth every morning. 30 tablet 3   No facility-administered medications prior to visit.    ROS: Review of Systems  Constitutional:  Positive for fatigue. Negative for activity change, appetite change, chills and unexpected weight change.  HENT:  Negative for congestion, mouth sores and sinus pressure.   Eyes:  Negative for visual disturbance.  Respiratory:  Negative for  cough and chest tightness.   Gastrointestinal:  Negative for abdominal pain and nausea.  Genitourinary:  Negative for difficulty urinating, frequency and vaginal pain.  Musculoskeletal:  Positive for arthralgias, back pain, neck pain and neck stiffness. Negative for gait problem.  Skin:  Negative for pallor, rash and wound.  Neurological:  Negative for dizziness, tremors, weakness, numbness and headaches.  Hematological:  Does not bruise/bleed easily.  Psychiatric/Behavioral:  Negative for confusion and sleep disturbance.     Objective:  BP (!) 170/100   Pulse (!) 105   Temp 98.7 F (37.1 C) (Oral)   Ht 5' 4 (1.626 m)   Wt 161 lb 9.6 oz (73.3 kg)   LMP 06/07/2012   SpO2 97%   BMI 27.74 kg/m   BP Readings from Last 3 Encounters:   01/03/24 (!) 170/100  11/01/23 (!) 155/100  09/19/23 (!) 160/120    Wt Readings from Last 3 Encounters:  01/03/24 161 lb 9.6 oz (73.3 kg)  11/01/23 152 lb (68.9 kg)  09/19/23 152 lb (68.9 kg)    Physical Exam Constitutional:      General: She is not in acute distress.    Appearance: She is well-developed.  HENT:     Head: Normocephalic.     Right Ear: External ear normal.     Left Ear: External ear normal.     Nose: Nose normal.  Eyes:     General:        Right eye: No discharge.        Left eye: No discharge.     Conjunctiva/sclera: Conjunctivae normal.     Pupils: Pupils are equal, round, and reactive to light.  Neck:     Thyroid : No thyromegaly.     Vascular: No JVD.     Trachea: No tracheal deviation.  Cardiovascular:     Rate and Rhythm: Normal rate and regular rhythm.     Heart sounds: Normal heart sounds.  Pulmonary:     Effort: No respiratory distress.     Breath sounds: No stridor. No wheezing.  Abdominal:     General: Bowel sounds are normal. There is no distension.     Palpations: Abdomen is soft. There is no mass.     Tenderness: There is no abdominal tenderness. There is no guarding or rebound.  Musculoskeletal:        General: Tenderness present.     Cervical back: Normal range of motion and neck supple. No rigidity.  Lymphadenopathy:     Cervical: No cervical adenopathy.  Skin:    Findings: No erythema or rash.  Neurological:     Mental Status: Mental status is at baseline.     Cranial Nerves: No cranial nerve deficit.     Motor: No abnormal muscle tone.     Coordination: Coordination normal.     Deep Tendon Reflexes: Reflexes normal.  Psychiatric:        Behavior: Behavior normal.        Thought Content: Thought content normal.        Judgment: Judgment normal.    L trap is spastic and firm L shoulder w/good passive ROM, NT  MS ok DTRs ok   Lab Results  Component Value Date   WBC 10.6 (H) 09/19/2023   HGB 15.1 (H) 09/19/2023    HCT 46.0 09/19/2023   PLT 394.0 09/19/2023   GLUCOSE 109 (H) 09/19/2023   CHOL 255 (H) 03/26/2022   TRIG 232.0 (H) 03/26/2022   HDL 46.60 03/26/2022  LDLDIRECT 148.0 03/26/2022   LDLCALC 122 (H) 02/11/2021   ALT 11 09/19/2023   AST 17 09/19/2023   NA 141 09/19/2023   K 4.3 09/19/2023   CL 102 09/19/2023   CREATININE 1.71 (H) 09/19/2023   BUN 34 (H) 09/19/2023   CO2 27 09/19/2023   TSH 1.99 08/12/2022   INR 1.1 12/23/2022   HGBA1C 5.5 06/18/2021    ECHOCARDIOGRAM COMPLETE Result Date: 12/24/2022    ECHOCARDIOGRAM REPORT   Patient Name:   [facility] [place] Date of Exam: 12/24/2022 Medical Rec #:  [id number]         Height:       64.0 in Accession #:    [id number]        Weight:       156.0 lb Date of Birth:  01-08-1961         BSA:          1.760 m Patient Age:    62 years          BP:           162/97 mmHg Patient Gender: F                 HR:           83 bpm. Exam Location:  Inpatient Procedure: 2D Echo, Cardiac Doppler and Color Doppler Indications:    Syncope R55                 Pericardial effusion I31.3                 NSTEMI I21.4  History:        Patient has prior history of Echocardiogram examinations, most                 recent 12/15/2018. CAD and Previous Myocardial Infarction; Risk                 Factors:Former Smoker and Hypertension.  Sonographer:    Jayson Gaskins Referring Phys: [id number] SUBRINA SUNDIL IMPRESSIONS  1. Left ventricular ejection fraction, by estimation, is 40 to 45%. The left ventricle has mildly decreased function. The left ventricle demonstrates regional wall motion abnormalities (see scoring diagram/findings for description). There is mild left ventricular hypertrophy of the septal segment. Left ventricular diastolic parameters are consistent with Grade I diastolic dysfunction (impaired relaxation). Elevated left ventricular end-diastolic pressure.  2. Right ventricular systolic function is normal. The right ventricular size is normal. There is normal pulmonary  artery systolic pressure.  3. A small pericardial effusion is present. The pericardial effusion is anterior to the right ventricle. There is no evidence of cardiac tamponade.  4. The mitral valve is normal in structure. Mild mitral valve regurgitation. No evidence of mitral stenosis.  5. The aortic valve is normal in structure. Aortic valve regurgitation is trivial. No aortic stenosis is present.  6. The inferior vena cava is normal in size with greater than 50% respiratory variability, suggesting right atrial pressure of 3 mmHg. FINDINGS  Left Ventricle: Left ventricular ejection fraction, by estimation, is 40 to 45%. The left ventricle has mildly decreased function. The left ventricle demonstrates regional wall motion abnormalities. The left ventricular internal cavity size was normal in size. There is mild left ventricular hypertrophy of the septal segment. Left ventricular diastolic parameters are consistent with Grade I diastolic dysfunction (impaired relaxation). Elevated left ventricular end-diastolic pressure.  LV Wall Scoring: The entire anterior wall, inferior septum, inferior wall, posterior wall, and basal  anteroseptal segment are hypokinetic. The antero-lateral wall, mid anteroseptal segment, apical lateral segment, apical inferior segment, and apex are normal. Right Ventricle: The right ventricular size is normal. No increase in right ventricular wall thickness. Right ventricular systolic function is normal. There is normal pulmonary artery systolic pressure. The tricuspid regurgitant velocity is 2.52 m/s, and  with an assumed right atrial pressure of 3 mmHg, the estimated right ventricular systolic pressure is 28.4 mmHg. Left Atrium: Left atrial size was normal in size. Right Atrium: Right atrial size was normal in size. Pericardium: A small pericardial effusion is present. The pericardial effusion is anterior to the right ventricle. There is no evidence of cardiac tamponade. Mitral Valve: The mitral  valve is normal in structure. Mild mitral valve regurgitation. No evidence of mitral valve stenosis. Tricuspid Valve: The tricuspid valve is normal in structure. Tricuspid valve regurgitation is trivial. No evidence of tricuspid stenosis. Aortic Valve: The aortic valve is normal in structure. Aortic valve regurgitation is trivial. No aortic stenosis is present. Aortic valve mean gradient measures 4.0 mmHg. Aortic valve peak gradient measures 6.7 mmHg. Aortic valve area, by VTI measures 1.94 cm. Pulmonic Valve: The pulmonic valve was normal in structure. Pulmonic valve regurgitation is not visualized. No evidence of pulmonic stenosis. Aorta: The aortic root is normal in size and structure. Venous: The inferior vena cava is normal in size with greater than 50% respiratory variability, suggesting right atrial pressure of 3 mmHg. IAS/Shunts: No atrial level shunt detected by color flow Doppler.  LEFT VENTRICLE PLAX 2D LVIDd:         3.50 cm   Diastology LVIDs:         2.40 cm   LV e' medial:    3.48 cm/s LV PW:         0.90 cm   LV E/e' medial:  19.6 LV IVS:        1.10 cm   LV e' lateral:   3.59 cm/s LVOT diam:     1.90 cm   LV E/e' lateral: 19.0 LV SV:         55 LV SV Index:   31 LVOT Area:     2.84 cm  LEFT ATRIUM             Index        RIGHT ATRIUM           Index LA Vol (A2C):   46.0 ml 26.13 ml/m  RA Area:     12.70 cm LA Vol (A4C):   47.0 ml 26.70 ml/m  RA Volume:   30.00 ml  17.04 ml/m LA Biplane Vol: 46.7 ml 26.53 ml/m  AORTIC VALVE AV Area (Vmax):    2.22 cm AV Area (Vmean):   2.14 cm AV Area (VTI):     1.94 cm AV Vmax:           129.00 cm/s AV Vmean:          101.000 cm/s AV VTI:            0.285 m AV Peak Grad:      6.7 mmHg AV Mean Grad:      4.0 mmHg LVOT Vmax:         101.00 cm/s LVOT Vmean:        76.400 cm/s LVOT VTI:          0.195 m LVOT/AV VTI ratio: 0.68  AORTA Ao Asc diam: 3.80 cm MITRAL VALVE  TRICUSPID VALVE MV Area (PHT): 6.07 cm     TR Peak grad:   25.4 mmHg MV Decel  Time: 125 msec     TR Vmax:        252.00 cm/s MV E velocity: 68.10 cm/s MV A velocity: 123.00 cm/s  SHUNTS MV E/A ratio:  0.55         Systemic VTI:  0.20 m                             Systemic Diam: 1.90 cm Annabella Scarce MD Electronically signed by Annabella Scarce MD Signature Date/Time: 12/24/2022/12:18:38 PM    Final    CT HEAD WO CONTRAST ( ) Result Date: 12/24/2022 CLINICAL DATA:  Near syncope. EXAM: CT HEAD WITHOUT CONTRAST TECHNIQUE: Contiguous axial images were obtained from the base of the skull through the vertex without intravenous contrast. RADIATION DOSE REDUCTION: This exam was performed according to the departmental dose-optimization program which includes automated exposure control, adjustment of the mA and/or kV according to patient size and/or use of iterative reconstruction technique. COMPARISON:  July 18, 2017 FINDINGS: Brain: No evidence of acute infarction, hemorrhage, hydrocephalus, extra-axial collection or mass lesion/mass effect. Mild areas of decreased attenuation are seen within the white matter tracts of the supratentorial brain, consistent with microvascular disease changes. Vascular: No hyperdense vessel or unexpected calcification. Skull: Normal. Negative for fracture or focal lesion. Sinuses/Orbits: No acute finding. Other: None. IMPRESSION: Mild, chronic white matter small vessel ischemic changes without evidence of an acute intracranial abnormality. Electronically Signed   By: Suzen Dials M.D.   On: 12/24/2022 00:05   DG Chest Port 1 View Result Date: 12/23/2022 CLINICAL DATA:  Shortness of breath EXAM: PORTABLE CHEST 1 VIEW COMPARISON:  12/14/2018 FINDINGS: Borderline cardiomegaly. No acute airspace disease or effusion. No pneumothorax. IMPRESSION: No active disease. Electronically Signed   By: Luke Bun M.D.   On: 12/23/2022 20:08   CT Angio Chest/Abd/Pel for Dissection W and/or Wo Contrast Result Date: 12/23/2022 CLINICAL DATA:  Nausea and dizziness for  a few hours. EXAM: CT ANGIOGRAPHY CHEST, ABDOMEN AND PELVIS TECHNIQUE: Non-contrast CT of the chest was initially obtained. Multidetector CT imaging through the chest, abdomen and pelvis was performed using the standard protocol during bolus administration of intravenous contrast. Multiplanar reconstructed images and MIPs were obtained and reviewed to evaluate the vascular anatomy. RADIATION DOSE REDUCTION: This exam was performed according to the departmental dose-optimization program which includes automated exposure control, adjustment of the mA and/or kV according to patient size and/or use of iterative reconstruction technique. CONTRAST:  OMNIPAQUE  IOHEXOL  350 MG/ML SOLN COMPARISON:  Radiograph 12/14/2018 and CTA chest abdomen and pelvis 07/18/2017 FINDINGS: CTA CHEST FINDINGS Cardiovascular: No intramural hematoma, aneurysm, or dissection in the thoracic aorta. No pericardial effusion. No central pulmonary embolism Mediastinum/Nodes: Small hiatal hernia. Trachea is unremarkable. No thoracic adenopathy. Lungs/Pleura: Bilateral lower lobe scarring and atelectasis with mild bronchiolectasis likely postinfectious. No pleural effusion or pneumothorax Musculoskeletal: No acute fracture. Review of the MIP images confirms the above findings. CTA ABDOMEN AND PELVIS FINDINGS VASCULAR Mild scattered calcified atherosclerotic plaque without hemodynamically significant stenosis. No aortic aneurysm or dissection. The mesenteric and renal arteries are widely patent. Widely patent inflow and outflow arteries bilaterally. Review of the MIP images confirms the above findings. NON-VASCULAR Hepatobiliary: No focal liver abnormality is seen. No gallstones, gallbladder wall thickening, or biliary dilatation. Pancreas: Unremarkable. Spleen: Unremarkable. Adrenals/Urinary Tract: Stable adrenal glands. Bilateral nonobstructing nephrolithiasis. No  hydronephrosis. Unremarkable bladder. Stomach/Bowel: Normal caliber large and small  bowel. No bowel wall thickening. Colonic diverticulosis without diverticulitis. Normal appendix. Stomach is within normal limits. Lymphatic: No lymphadenopathy. Reproductive: Hysterectomy. Other: No free intraperitoneal fluid or air. Musculoskeletal: No acute fracture. Review of the MIP images confirms the above findings. IMPRESSION: 1. No acute aortic syndrome. 2. No acute findings in the chest, abdomen, or pelvis. 3. Bilateral nonobstructing nephrolithiasis. 4. Colonic diverticulosis without diverticulitis. Electronically Signed   By: Norman Gatlin M.D.   On: 12/23/2022 20:01    Assessment & Plan:   Problem List Items Addressed This Visit     CAD (coronary artery disease) (Chronic)   Chronic Dr Okey ASA, NTG prn, Pravachol   Potential benefits of a long term HRT use in pt's w/CAD as well as potential risks  and complications were explained to the patient and were aknowledged.      Chronic pain syndrome   LBP On Tramadol  XR  Potential benefits of a long term opioids use as well as potential risks (i.e. addiction risk, apnea etc) and complications (i.e. Somnolence, constipation and others) were explained to the patient and were aknowledged.       Relevant Medications   cyclobenzaprine  (FLEXERIL ) 5 MG tablet   methylPREDNISolone  (MEDROL  DOSEPAK) 4 MG TBPK tablet   traMADol  (ULTRAM -ER) 100 MG 24 hr tablet   traMADol  (ULTRAM -ER) 200 MG 24 hr tablet   CKD (chronic kidney disease) stage 4, GFR 15-29 ml/min (HCC)   We will monitor GFR. Hydrate well      Fatigue   Weakness and fatigue - CFS.       LOW BACK PAIN    Cont on Tramadol  ER 200 mg qhs (max 300 mg/24 h)  Potential benefits of a long term opioids use as well as potential risks (i.e. addiction risk, apnea etc) and complications (i.e. Somnolence, constipation and others) were explained to the patient and were aknowledged.      Relevant Medications   cyclobenzaprine  (FLEXERIL ) 5 MG tablet   methylPREDNISolone  (MEDROL   DOSEPAK) 4 MG TBPK tablet   traMADol  (ULTRAM -ER) 100 MG 24 hr tablet   traMADol  (ULTRAM -ER) 200 MG 24 hr tablet   Neck muscle spasm - Primary   New, but recurrent - L trap Medrol  pack Heat Flexeril  prn         Meds ordered this encounter  Medications   cyclobenzaprine  (FLEXERIL ) 5 MG tablet    Sig: Take 1 tablet (5 mg total) by mouth at bedtime.    Dispense:  30 tablet    Refill:  2   methylPREDNISolone  (MEDROL  DOSEPAK) 4 MG TBPK tablet    Sig: As directed    Dispense:  21 tablet    Refill:  0   traMADol  (ULTRAM -ER) 100 MG 24 hr tablet    Sig: Take 1 tablet (100 mg total) by mouth at bedtime.    Dispense:  30 tablet    Refill:  3   traMADol  (ULTRAM -ER) 200 MG 24 hr tablet    Sig: Take 1 tablet (200 mg total) by mouth every morning.    Dispense:  30 tablet    Refill:  3      Follow-up: Return in about 3 months (around 04/04/2024) for a follow-up visit.  Marolyn Noel, MD

## 2024-01-03 NOTE — Assessment & Plan Note (Signed)
Cont on Tramadol ER 200 mg qhs (max 300 mg/24 h)  Potential benefits of a long term opioids use as well as potential risks (i.e. addiction risk, apnea etc) and complications (i.e. Somnolence, constipation and others) were explained to the patient and were aknowledged. 

## 2024-01-03 NOTE — Assessment & Plan Note (Signed)
We will monitor GFR. Hydrate well

## 2024-01-03 NOTE — Assessment & Plan Note (Signed)
 Weakness and fatigue - CFS.

## 2024-01-03 NOTE — Assessment & Plan Note (Signed)
 Chronic Dr Tenny Craw ASA, NTG prn, Pravachol  Potential benefits of a long term HRT use in pt's w/CAD as well as potential risks  and complications were explained to the patient and were aknowledged.

## 2024-01-03 NOTE — Assessment & Plan Note (Signed)
 LBP On Tramadol XR  Potential benefits of a long term opioids use as well as potential risks (i.e. addiction risk, apnea etc) and complications (i.e. Somnolence, constipation and others) were explained to the patient and were aknowledged.

## 2024-01-03 NOTE — Assessment & Plan Note (Signed)
 New, but recurrent - L trap Medrol  pack Heat Flexeril  prn

## 2024-01-30 ENCOUNTER — Other Ambulatory Visit: Payer: Self-pay | Admitting: Internal Medicine

## 2024-03-06 DIAGNOSIS — K080 Exfoliation of teeth due to systemic causes: Secondary | ICD-10-CM | POA: Diagnosis not present

## 2024-04-04 ENCOUNTER — Ambulatory Visit: Admitting: Internal Medicine

## 2024-04-13 ENCOUNTER — Ambulatory Visit: Payer: Self-pay

## 2024-04-13 NOTE — Telephone Encounter (Signed)
 FYI Only or Action Required?: FYI only for provider: appointment scheduled on 04/16/24.  Patient was last seen in primary care on 01/03/2024 by Plotnikov, Karlynn GAILS, MD.  Called Nurse Triage reporting Dental Pain.  Symptoms began several weeks ago.  Interventions attempted: Ice/heat application.  Symptoms are: stable.  Triage Disposition: See HCP Within 4 Hours (Or PCP Triage)  Patient/caregiver understands and will follow disposition?:   Reason for Triage: Pt has tooth absences and woke up with swollen face. Pt said dentist will not pull the tooth until her BP comes down. She said they took it at the dental appt and the bottom number was over 100   Reason for Disposition  Face is very swollen  Answer Assessment - Initial Assessment Questions Pt went to dentist back in Dec and was given Amoxicillin  for tooth infection, dentist told pt her BP was too high to pull tooth so pt is calling now d/t facial swelling. She applied ice packs this morning and has helped with swelling. Pt reports BP today was 166/129 and is currently still taking all of her BP meds. UC advised d/t no appts today but pt preferred to schedule appt with PCP for 04/16/24.   1. LOCATION: Which tooth is hurting?  (e.g., right-side/left-side, upper/lower, front/back)     R top and bottom 2. ONSET: When did the toothache start?  (e.g., hours, days)      December 3. SEVERITY: How bad is the toothache?  (Scale 1-10; mild, moderate or severe)     No pain 4. SWELLING: Is there any visible swelling of your face?     R side of face from nose to jaw is swollen  5. OTHER SYMPTOMS: Do you have any other symptoms? (e.g., fever)     No  Protocols used: Toothache-A-AH

## 2024-04-16 ENCOUNTER — Ambulatory Visit: Admitting: Family Medicine

## 2024-04-16 ENCOUNTER — Other Ambulatory Visit: Payer: Self-pay | Admitting: Internal Medicine

## 2024-04-16 ENCOUNTER — Encounter: Payer: Self-pay | Admitting: Family Medicine

## 2024-04-16 VITALS — BP 162/108 | HR 88 | Temp 97.8°F | Ht 64.0 in | Wt 164.0 lb

## 2024-04-16 DIAGNOSIS — R220 Localized swelling, mass and lump, head: Secondary | ICD-10-CM

## 2024-04-16 DIAGNOSIS — K047 Periapical abscess without sinus: Secondary | ICD-10-CM | POA: Diagnosis not present

## 2024-04-16 DIAGNOSIS — I10 Essential (primary) hypertension: Secondary | ICD-10-CM | POA: Diagnosis not present

## 2024-04-16 MED ORDER — CEPHALEXIN 500 MG PO CAPS: 500.0000 mg | ORAL_CAPSULE | Freq: Four times a day (QID) | ORAL | 0 refills | Status: AC

## 2024-04-16 NOTE — Telephone Encounter (Signed)
 Attempted to reach pt to inform her of PCP advise as follows Take Keflex .  Take blood pressure meds.  Low-sodium diet. Gargle mouth with salt and baking soda solution Follow-up with your dentist Thanks  Please inform pt of the above upon her call back to the clinic.

## 2024-04-16 NOTE — Telephone Encounter (Addendum)
 Take Keflex .  Take blood pressure meds.  Low-sodium diet. Gargle mouth with salt and baking soda solution Follow-up with your dentist Thanks

## 2024-04-16 NOTE — Patient Instructions (Addendum)
 Please go pick up antibiotic.  Dr. Garald has sent in Keflex  for you to take 1 pill 4 times a [place] until completed.  Continue blood pressure medications.   Follow up on Thursday if BP is still elevated.   Follow-up with Dr. Garald as scheduled, sooner with me if needed

## 2024-04-16 NOTE — Progress Notes (Signed)
 "  Acute Office Visit  Subjective:     Patient ID: Hayley Miller, female    DOB: October 17, 1960, 64 y.o.   MRN: [id number]  Chief Complaint  Patient presents with   Hypertension    Pt states she needs to get teeth pulled and they would not do it bc her BP was too high. Last checked BP was 166/100    HPI  Discussed the use of AI scribe software for clinical note transcription with the patient, who gave verbal consent to proceed.  History of Present Illness Hayley Miller is a 64 year old female with hypertension who presents with elevated blood pressure preventing dental extraction. Presents with adult female who is supplementing history  Hypertension and blood pressure monitoring - Elevated home blood pressure readings up to 166/100 mmHg and 160/108 mmHg. - Blood pressure elevation has prevented dental extraction. - Current antihypertensive regimen includes hydralazine , lisinopril , and verapamil . - Uncertainty regarding accuracy of home blood pressure monitor; uses a digital arm cuff, wrist monitor not available.  Dental pain and infection - Ongoing dental pain and swelling. - Currently taking Keflex  for dental infection after previous amoxicillin  use. - Dental extraction deferred due to elevated blood pressure.  Medication adverse effects and interactions - Dizziness, weakness, and near-syncope when taking amoxicillin  with antihypertensive medications. - History of feeling weak, dizzy, and weird requiring three-[facility] hospitalization, believed to be related to medication interactions. - Dizziness and weakness occur when taking multiple medications together. - Concern about taking antibiotics with blood pressure medications.  Chronic pain syndrome - Fibromyalgia treated with pain medications.     ROS Per HPI      Objective:    BP (!) 162/108   Pulse 88   Temp 97.8 F (36.6 C) (Temporal)   Ht 5' 4 (1.626 m)   Wt 164 lb (74.4 kg)   LMP 06/07/2012   SpO2 98%    BMI 28.15 kg/m    Physical Exam Vitals and nursing note reviewed.  Constitutional:      General: She is not in acute distress.    Appearance: Normal appearance. She is normal weight.  HENT:     Head: Normocephalic and atraumatic.     Right Ear: External ear normal.     Left Ear: External ear normal.     Nose: Nose normal.     Mouth/Throat:     Mouth: Mucous membranes are moist.     Comments: Multiple broken teeth to right upper and lower jaw, mild erythema and swelling to the right cheek and upper gums.  No discharge or bleeding noted Eyes:     Extraocular Movements: Extraocular movements intact.     Pupils: Pupils are equal, round, and reactive to light.  Cardiovascular:     Rate and Rhythm: Normal rate and regular rhythm.     Pulses: Normal pulses.     Heart sounds: Normal heart sounds.  Pulmonary:     Effort: Pulmonary effort is normal. No respiratory distress.     Breath sounds: Normal breath sounds. No wheezing, rhonchi or rales.  Musculoskeletal:        General: Normal range of motion.     Cervical back: Normal range of motion.     Right lower leg: No edema.     Left lower leg: No edema.  Lymphadenopathy:     Cervical: No cervical adenopathy.  Neurological:     General: No focal deficit present.     Mental Status: She is alert and  oriented to person, place, and time.  Psychiatric:        Mood and Affect: Mood normal.        Thought Content: Thought content normal.     No results found for any visits on 04/16/24.      Assessment & Plan:   Assessment and Plan Assessment & Plan Dental infection with facial swelling Dental infection with facial swelling likely elevating blood pressure. Amoxicillin  caused adverse effects due to interaction with antihypertensives. - Dr. Garald has prescribed Keflex  4 times daily as alternative to amoxicillin . - Advised monitoring symptoms and reporting if blood pressure remains elevated after 2-3 days of  antibiotics.  Essential hypertension Elevated blood pressure potentially worsened by dental infection and pain. Current antihypertensive regimen maintained. - Continue current antihypertensive medications. - Monitor blood pressure at home and report if elevated after 2-3 days of antibiotic treatment. - BP could be elevated due to infection, hoping that this will resolve as infection resolves     No orders of the defined types were placed in this encounter.    No orders of the defined types were placed in this encounter.   Return if symptoms worsen or fail to improve.  Corean LITTIE Ku, FNP  "

## 2024-04-17 ENCOUNTER — Encounter: Payer: Self-pay | Admitting: *Deleted

## 2024-04-17 NOTE — Progress Notes (Signed)
 Hayley Miller                                          MRN: [id number]   04/17/2024   The [facility] Quality Team Specialist reviewed this patient medical record for the purposes of chart review for care gap closure. The following were reviewed: chart review for care gap closure-controlling blood pressure.    [facility] Quality Team

## 2024-04-26 ENCOUNTER — Encounter: Payer: Self-pay | Admitting: Internal Medicine

## 2024-04-26 ENCOUNTER — Ambulatory Visit: Admitting: Internal Medicine

## 2024-04-26 VITALS — BP 160/110 | HR 82 | Temp 99.2°F | Ht 64.0 in | Wt 168.0 lb

## 2024-04-26 DIAGNOSIS — K047 Periapical abscess without sinus: Secondary | ICD-10-CM | POA: Diagnosis not present

## 2024-04-26 DIAGNOSIS — G894 Chronic pain syndrome: Secondary | ICD-10-CM

## 2024-04-26 DIAGNOSIS — I10 Essential (primary) hypertension: Secondary | ICD-10-CM | POA: Diagnosis not present

## 2024-04-26 MED ORDER — VERAPAMIL HCL ER 240 MG PO TBCR: 240.0000 mg | EXTENDED_RELEASE_TABLET | Freq: Every day | ORAL | 11 refills | Status: AC

## 2024-04-26 MED ORDER — TRAMADOL HCL ER 100 MG PO TB24: 100.0000 mg | ORAL_TABLET | Freq: Every day | ORAL | 3 refills | Status: AC

## 2024-04-26 MED ORDER — TRAMADOL HCL ER 200 MG PO TB24: 200.0000 mg | ORAL_TABLET | Freq: Every morning | ORAL | 3 refills | Status: AC

## 2024-04-26 MED ORDER — CARVEDILOL 25 MG PO TABS: 25.0000 mg | ORAL_TABLET | Freq: Two times a day (BID) | ORAL | 11 refills | Status: AC

## 2024-04-26 MED ORDER — TRIAMTERENE-HCTZ 37.5-25 MG PO TABS: 1.0000 | ORAL_TABLET | Freq: Every day | ORAL | 11 refills | Status: AC

## 2024-04-26 MED ORDER — CLONIDINE HCL 0.1 MG PO TABS: 0.1000 mg | ORAL_TABLET | Freq: Three times a day (TID) | ORAL | 3 refills | Status: AC | PRN

## 2024-04-26 MED ORDER — HYDRALAZINE HCL 100 MG PO TABS: 100.0000 mg | ORAL_TABLET | Freq: Two times a day (BID) | ORAL | 11 refills | Status: AC

## 2024-04-26 NOTE — Assessment & Plan Note (Signed)
 Will try Verapamil  (Gingival hyperplasia is not better off amlodipine ) Not taking hydrochlorothiazide  - start Maxzide Add Clonidine  1 mg for high SBP>150

## 2024-04-26 NOTE — Progress Notes (Signed)
 Just I was like you had a good hide you tired thank you medicines for blood pressure discussed yes what time of the Kimber did take 7 AM yeah you  Subjective:  Patient ID: Hayley Miller, female    DOB: [birth year]  Age: 64 y.o. MRN: [id number]  CC: No chief complaint on file.   HPI Hayley Miller presents for LBP, CFS, HTN C/o foot abscess - needs to have 2 teeth pulled ou; has to have BP under control...  Outpatient Medications Prior to Visit  Medication Sig Dispense Refill   acetaminophen  (TYLENOL ) 500 MG tablet Take 500 mg by mouth every 6 (six) hours as needed.     albuterol  (VENTOLIN  HFA) 108 (90 Base) MCG/ACT inhaler Inhale 2 puffs into the lungs every 4 (four) hours as needed for wheezing or shortness of breath. 8 g 5   aspirin  EC 81 MG tablet Take 81 mg by mouth daily.     azelastine  (ASTELIN ) 0.1 % nasal spray Place 1 spray into both nostrils 2 (two) times daily. Use in each nostril as directed 30 mL 5   cephALEXin  (KEFLEX ) 500 MG capsule Take 1 capsule (500 mg total) by mouth 4 (four) times daily. 28 capsule 0   cholecalciferol (VITAMIN D3) 25 MCG (1000 UNIT) tablet Take 1,000 Units by mouth daily.     cyclobenzaprine  (FLEXERIL ) 5 MG tablet Take 1 tablet (5 mg total) by mouth at bedtime. 30 tablet 2   esomeprazole  (NEXIUM ) 40 MG capsule Take 1 capsule (40 mg total) by mouth daily. 90 capsule 5   estradiol  (ESTRACE ) 1 MG tablet Take 1 tablet (1 mg total) by mouth daily. 30 tablet 5   Multiple Vitamin (MULTIVITAMIN WITH MINERALS) TABS tablet Take 1 tablet by mouth daily.     nitroGLYCERIN  (NITROSTAT ) 0.4 MG SL tablet Place 1 tablet (0.4 mg total) under the tongue every 5 (five) minutes as needed for chest pain. 20 tablet 2   Tetrahydrozoline HCl (VISINE OP) Place 1 drop into both eyes daily as needed (dry eyes).     carvedilol  (COREG ) 25 MG tablet Take 1 tablet (25 mg total) by mouth 2 (two) times daily. 60 tablet 5   hydrALAZINE  (APRESOLINE ) 100 MG tablet TAKE ONE TABLET BY MOUTH  TWICE DAILY 60 tablet 5   hydrochlorothiazide  (HYDRODIURIL ) 25 MG tablet Take 1 tablet (25 mg total) by mouth daily. 30 tablet 2   traMADol  (ULTRAM -ER) 100 MG 24 hr tablet Take 1 tablet (100 mg total) by mouth at bedtime. 30 tablet 3   traMADol  (ULTRAM -ER) 200 MG 24 hr tablet Take 1 tablet (200 mg total) by mouth every morning. 30 tablet 3   verapamil  (CALAN -SR) 240 MG CR tablet Take 1 tablet (240 mg total) by mouth daily. 30 tablet 11   No facility-administered medications prior to visit.    ROS: Review of Systems  Constitutional:  Positive for fatigue. Negative for activity change, appetite change, chills, diaphoresis, fever and unexpected weight change.  HENT:  Negative for congestion, dental problem, ear pain, hearing loss, mouth sores, postnasal drip, sinus pressure, sneezing, sore throat and voice change.   Eyes:  Negative for pain and visual disturbance.  Respiratory:  Negative for cough, chest tightness, wheezing and stridor.   Cardiovascular:  Negative for chest pain, palpitations and leg swelling.  Gastrointestinal:  Negative for abdominal distention, abdominal pain, blood in stool, nausea, rectal pain and vomiting.  Genitourinary:  Negative for decreased urine volume, difficulty urinating, dysuria, frequency, hematuria, menstrual problem, vaginal bleeding,  vaginal discharge and vaginal pain.  Musculoskeletal:  Positive for arthralgias, back pain and gait problem. Negative for joint swelling and neck pain.  Skin:  Negative for color change, pallor, rash and wound.  Neurological:  Negative for dizziness, tremors, syncope, speech difficulty, weakness, light-headedness, numbness and headaches.  Hematological:  Negative for adenopathy. Does not bruise/bleed easily.  Psychiatric/Behavioral:  Positive for decreased concentration. Negative for behavioral problems, confusion, dysphoric mood, hallucinations, sleep disturbance and suicidal ideas. The patient is nervous/anxious. The patient is  not hyperactive.     Objective:  BP (!) 160/110   Pulse 82   Temp 99.2 F (37.3 C) (Oral)   Ht 5' 4 (1.626 m)   Wt 168 lb (76.2 kg)   LMP 06/07/2012   SpO2 95%   BMI 28.84 kg/m   BP Readings from Last 3 Encounters:  04/26/24 (!) 160/110  04/16/24 (!) 162/108  01/03/24 (!) 170/100    Wt Readings from Last 3 Encounters:  04/26/24 168 lb (76.2 kg)  04/16/24 164 lb (74.4 kg)  01/03/24 161 lb 9.6 oz (73.3 kg)    Physical Exam Constitutional:      General: She is not in acute distress.    Appearance: She is well-developed. She is obese.  HENT:     Head: Normocephalic.     Right Ear: External ear normal.     Left Ear: External ear normal.     Nose: Nose normal.  Eyes:     General:        Right eye: No discharge.        Left eye: No discharge.     Conjunctiva/sclera: Conjunctivae normal.     Pupils: Pupils are equal, round, and reactive to light.  Neck:     Thyroid : No thyromegaly.     Vascular: No JVD.     Trachea: No tracheal deviation.  Cardiovascular:     Rate and Rhythm: Normal rate and regular rhythm.     Heart sounds: Normal heart sounds.  Pulmonary:     Effort: No respiratory distress.     Breath sounds: No stridor. No wheezing.  Abdominal:     General: Bowel sounds are normal. There is no distension.     Palpations: Abdomen is soft. There is no mass.     Tenderness: There is no abdominal tenderness. There is no guarding or rebound.  Musculoskeletal:        General: Tenderness present.     Cervical back: Normal range of motion and neck supple. No rigidity.  Lymphadenopathy:     Cervical: No cervical adenopathy.  Skin:    Findings: No erythema or rash.  Neurological:     Mental Status: She is oriented to person, place, and time.     Cranial Nerves: No cranial nerve deficit.     Motor: No abnormal muscle tone.     Coordination: Coordination normal.     Gait: Gait normal.     Deep Tendon Reflexes: Reflexes normal.  Psychiatric:        Behavior:  Behavior normal.        Thought Content: Thought content normal.        Judgment: Judgment normal.   LS w/pain  Lab Results  Component Value Date   WBC 10.6 (H) 09/19/2023   HGB 15.1 (H) 09/19/2023   HCT 46.0 09/19/2023   PLT 394.0 09/19/2023   GLUCOSE 109 (H) 09/19/2023   CHOL 255 (H) 03/26/2022   TRIG 232.0 (H) 03/26/2022   HDL  46.60 03/26/2022   LDLDIRECT 148.0 03/26/2022   LDLCALC 122 (H) 02/11/2021   ALT 11 09/19/2023   AST 17 09/19/2023   NA 141 09/19/2023   K 4.3 09/19/2023   CL 102 09/19/2023   CREATININE 1.71 (H) 09/19/2023   BUN 34 (H) 09/19/2023   CO2 27 09/19/2023   TSH 1.99 08/12/2022   INR 1.1 12/23/2022   HGBA1C 5.5 06/18/2021    ECHOCARDIOGRAM COMPLETE Result Date: 12/24/2022    ECHOCARDIOGRAM REPORT   Patient Name:   [facility] Date of Exam: 12/24/2022 Medical Rec #:  [id number]         Height:       64.0 in Accession #:    [id number]        Weight:       156.0 lb Date of Birth:  09/01/1960         BSA:          1.760 m Patient Age:    62 years          BP:           162/97 mmHg Patient Gender: F                 HR:           83 bpm. Exam Location:  Inpatient Procedure: 2D Echo, Cardiac Doppler and Color Doppler Indications:    Syncope R55                 Pericardial effusion I31.3                 NSTEMI I21.4  History:        Patient has prior history of Echocardiogram examinations, most                 recent 12/15/2018. CAD and Previous Myocardial Infarction; Risk                 Factors:Former Smoker and Hypertension.  Sonographer:    Jayson Gaskins Referring Phys: [id number] SUBRINA SUNDIL IMPRESSIONS  1. Left ventricular ejection fraction, by estimation, is 40 to 45%. The left ventricle has mildly decreased function. The left ventricle demonstrates regional wall motion abnormalities (see scoring diagram/findings for description). There is mild left ventricular hypertrophy of the septal segment. Left ventricular diastolic parameters are consistent with Grade I  diastolic dysfunction (impaired relaxation). Elevated left ventricular end-diastolic pressure.  2. Right ventricular systolic function is normal. The right ventricular size is normal. There is normal pulmonary artery systolic pressure.  3. A small pericardial effusion is present. The pericardial effusion is anterior to the right ventricle. There is no evidence of cardiac tamponade.  4. The mitral valve is normal in structure. Mild mitral valve regurgitation. No evidence of mitral stenosis.  5. The aortic valve is normal in structure. Aortic valve regurgitation is trivial. No aortic stenosis is present.  6. The inferior vena cava is normal in size with greater than 50% respiratory variability, suggesting right atrial pressure of 3 mmHg. FINDINGS  Left Ventricle: Left ventricular ejection fraction, by estimation, is 40 to 45%. The left ventricle has mildly decreased function. The left ventricle demonstrates regional wall motion abnormalities. The left ventricular internal cavity size was normal in size. There is mild left ventricular hypertrophy of the septal segment. Left ventricular diastolic parameters are consistent with Grade I diastolic dysfunction (impaired relaxation). Elevated left ventricular end-diastolic pressure.  LV Wall Scoring: The entire anterior wall, inferior septum, inferior wall,  posterior wall, and basal anteroseptal segment are hypokinetic. The antero-lateral wall, mid anteroseptal segment, apical lateral segment, apical inferior segment, and apex are normal. Right Ventricle: The right ventricular size is normal. No increase in right ventricular wall thickness. Right ventricular systolic function is normal. There is normal pulmonary artery systolic pressure. The tricuspid regurgitant velocity is 2.52 m/s, and  with an assumed right atrial pressure of 3 mmHg, the estimated right ventricular systolic pressure is 28.4 mmHg. Left Atrium: Left atrial size was normal in size. Right Atrium: Right  atrial size was normal in size. Pericardium: A small pericardial effusion is present. The pericardial effusion is anterior to the right ventricle. There is no evidence of cardiac tamponade. Mitral Valve: The mitral valve is normal in structure. Mild mitral valve regurgitation. No evidence of mitral valve stenosis. Tricuspid Valve: The tricuspid valve is normal in structure. Tricuspid valve regurgitation is trivial. No evidence of tricuspid stenosis. Aortic Valve: The aortic valve is normal in structure. Aortic valve regurgitation is trivial. No aortic stenosis is present. Aortic valve mean gradient measures 4.0 mmHg. Aortic valve peak gradient measures 6.7 mmHg. Aortic valve area, by VTI measures 1.94 cm. Pulmonic Valve: The pulmonic valve was normal in structure. Pulmonic valve regurgitation is not visualized. No evidence of pulmonic stenosis. Aorta: The aortic root is normal in size and structure. Venous: The inferior vena cava is normal in size with greater than 50% respiratory variability, suggesting right atrial pressure of 3 mmHg. IAS/Shunts: No atrial level shunt detected by color flow Doppler.  LEFT VENTRICLE PLAX 2D LVIDd:         3.50 cm   Diastology LVIDs:         2.40 cm   LV e' medial:    3.48 cm/s LV PW:         0.90 cm   LV E/e' medial:  19.6 LV IVS:        1.10 cm   LV e' lateral:   3.59 cm/s LVOT diam:     1.90 cm   LV E/e' lateral: 19.0 LV SV:         55 LV SV Index:   31 LVOT Area:     2.84 cm  LEFT ATRIUM             Index        RIGHT ATRIUM           Index LA Vol (A2C):   46.0 ml 26.13 ml/m  RA Area:     12.70 cm LA Vol (A4C):   47.0 ml 26.70 ml/m  RA Volume:   30.00 ml  17.04 ml/m LA Biplane Vol: 46.7 ml 26.53 ml/m  AORTIC VALVE AV Area (Vmax):    2.22 cm AV Area (Vmean):   2.14 cm AV Area (VTI):     1.94 cm AV Vmax:           129.00 cm/s AV Vmean:          101.000 cm/s AV VTI:            0.285 m AV Peak Grad:      6.7 mmHg AV Mean Grad:      4.0 mmHg LVOT Vmax:         101.00 cm/s  LVOT Vmean:        76.400 cm/s LVOT VTI:          0.195 m LVOT/AV VTI ratio: 0.68  AORTA Ao Asc diam: 3.80 cm MITRAL VALVE  TRICUSPID VALVE MV Area (PHT): 6.07 cm     TR Peak grad:   25.4 mmHg MV Decel Time: 125 msec     TR Vmax:        252.00 cm/s MV E velocity: 68.10 cm/s MV A velocity: 123.00 cm/s  SHUNTS MV E/A ratio:  0.55         Systemic VTI:  0.20 m                             Systemic Diam: 1.90 cm Annabella Scarce MD Electronically signed by Annabella Scarce MD Signature Date/Time: 12/24/2022/12:18:38 PM    Final    CT HEAD WO CONTRAST ( ) Result Date: 12/24/2022 CLINICAL DATA:  Near syncope. EXAM: CT HEAD WITHOUT CONTRAST TECHNIQUE: Contiguous axial images were obtained from the base of the skull through the vertex without intravenous contrast. RADIATION DOSE REDUCTION: This exam was performed according to the departmental dose-optimization program which includes automated exposure control, adjustment of the mA and/or kV according to patient size and/or use of iterative reconstruction technique. COMPARISON:  July 18, 2017 FINDINGS: Brain: No evidence of acute infarction, hemorrhage, hydrocephalus, extra-axial collection or mass lesion/mass effect. Mild areas of decreased attenuation are seen within the white matter tracts of the supratentorial brain, consistent with microvascular disease changes. Vascular: No hyperdense vessel or unexpected calcification. Skull: Normal. Negative for fracture or focal lesion. Sinuses/Orbits: No acute finding. Other: None. IMPRESSION: Mild, chronic white matter small vessel ischemic changes without evidence of an acute intracranial abnormality. Electronically Signed   By: Suzen Dials M.D.   On: 12/24/2022 00:05   DG Chest Port 1 View Result Date: 12/23/2022 CLINICAL DATA:  Shortness of breath EXAM: PORTABLE CHEST 1 VIEW COMPARISON:  12/14/2018 FINDINGS: Borderline cardiomegaly. No acute airspace disease or effusion. No pneumothorax. IMPRESSION:  No active disease. Electronically Signed   By: Luke Bun M.D.   On: 12/23/2022 20:08   CT Angio Chest/Abd/Pel for Dissection W and/or Wo Contrast Result Date: 12/23/2022 CLINICAL DATA:  Nausea and dizziness for a few hours. EXAM: CT ANGIOGRAPHY CHEST, ABDOMEN AND PELVIS TECHNIQUE: Non-contrast CT of the chest was initially obtained. Multidetector CT imaging through the chest, abdomen and pelvis was performed using the standard protocol during bolus administration of intravenous contrast. Multiplanar reconstructed images and MIPs were obtained and reviewed to evaluate the vascular anatomy. RADIATION DOSE REDUCTION: This exam was performed according to the departmental dose-optimization program which includes automated exposure control, adjustment of the mA and/or kV according to patient size and/or use of iterative reconstruction technique. CONTRAST:  OMNIPAQUE  IOHEXOL  350 MG/ML SOLN COMPARISON:  Radiograph 12/14/2018 and CTA chest abdomen and pelvis 07/18/2017 FINDINGS: CTA CHEST FINDINGS Cardiovascular: No intramural hematoma, aneurysm, or dissection in the thoracic aorta. No pericardial effusion. No central pulmonary embolism Mediastinum/Nodes: Small hiatal hernia. Trachea is unremarkable. No thoracic adenopathy. Lungs/Pleura: Bilateral lower lobe scarring and atelectasis with mild bronchiolectasis likely postinfectious. No pleural effusion or pneumothorax Musculoskeletal: No acute fracture. Review of the MIP images confirms the above findings. CTA ABDOMEN AND PELVIS FINDINGS VASCULAR Mild scattered calcified atherosclerotic plaque without hemodynamically significant stenosis. No aortic aneurysm or dissection. The mesenteric and renal arteries are widely patent. Widely patent inflow and outflow arteries bilaterally. Review of the MIP images confirms the above findings. NON-VASCULAR Hepatobiliary: No focal liver abnormality is seen. No gallstones, gallbladder wall thickening, or biliary dilatation.  Pancreas: Unremarkable. Spleen: Unremarkable. Adrenals/Urinary Tract: Stable adrenal glands. Bilateral nonobstructing nephrolithiasis. No  hydronephrosis. Unremarkable bladder. Stomach/Bowel: Normal caliber large and small bowel. No bowel wall thickening. Colonic diverticulosis without diverticulitis. Normal appendix. Stomach is within normal limits. Lymphatic: No lymphadenopathy. Reproductive: Hysterectomy. Other: No free intraperitoneal fluid or air. Musculoskeletal: No acute fracture. Review of the MIP images confirms the above findings. IMPRESSION: 1. No acute aortic syndrome. 2. No acute findings in the chest, abdomen, or pelvis. 3. Bilateral nonobstructing nephrolithiasis. 4. Colonic diverticulosis without diverticulitis. Electronically Signed   By: Norman Gatlin M.D.   On: 12/23/2022 20:01    Assessment & Plan:   Problem List Items Addressed This Visit     Chronic pain syndrome   LBP On Tramadol  XR  Potential benefits of a long term opioids use as well as potential risks (i.e. addiction risk, apnea etc) and complications (i.e. Somnolence, constipation and others) were explained to the patient and were aknowledged.       Relevant Medications   traMADol  (ULTRAM -ER) 100 MG 24 hr tablet   traMADol  (ULTRAM -ER) 200 MG 24 hr tablet   Severe uncontrolled hypertension (Chronic)   Will try Verapamil  (Gingival hyperplasia is not better off amlodipine ) Not taking hydrochlorothiazide  - start Maxzide Add Clonidine  1 mg for high SBP>150      Relevant Medications   carvedilol  (COREG ) 25 MG tablet   hydrALAZINE  (APRESOLINE ) 100 MG tablet   verapamil  (CALAN -SR) 240 MG CR tablet   triamterene -hydrochlorothiazide  (MAXZIDE-25) 37.5-25 MG tablet   cloNIDine  (CATAPRES ) 0.1 MG tablet   Dental abscess - Primary   On Keflex  F/u w/dentist Clonidine  prn if SBP>150 for the procedure         Meds ordered this encounter  Medications   carvedilol  (COREG ) 25 MG tablet    Sig: Take 1 tablet (25 mg  total) by mouth 2 (two) times daily.    Dispense:  60 tablet    Refill:  11   hydrALAZINE  (APRESOLINE ) 100 MG tablet    Sig: Take 1 tablet (100 mg total) by mouth 2 (two) times daily.    Dispense:  60 tablet    Refill:  11   verapamil  (CALAN -SR) 240 MG CR tablet    Sig: Take 1 tablet (240 mg total) by mouth daily.    Dispense:  30 tablet    Refill:  11   triamterene -hydrochlorothiazide  (MAXZIDE-25) 37.5-25 MG tablet    Sig: Take 1 tablet by mouth daily.    Dispense:  30 tablet    Refill:  11   cloNIDine  (CATAPRES ) 0.1 MG tablet    Sig: Take 1 tablet (0.1 mg total) by mouth 3 (three) times daily as needed (take if systolic BP>150).    Dispense:  90 tablet    Refill:  3   traMADol  (ULTRAM -ER) 100 MG 24 hr tablet    Sig: Take 1 tablet (100 mg total) by mouth at bedtime.    Dispense:  30 tablet    Refill:  3   traMADol  (ULTRAM -ER) 200 MG 24 hr tablet    Sig: Take 1 tablet (200 mg total) by mouth every morning.    Dispense:  30 tablet    Refill:  3      Follow-up: Return in about 4 weeks (around 05/24/2024) for a follow-up visit.  Marolyn Noel, MD

## 2024-04-26 NOTE — Assessment & Plan Note (Signed)
 On Keflex  F/u w/dentist Clonidine  prn if SBP>150 for the procedure

## 2024-04-26 NOTE — Assessment & Plan Note (Signed)
 LBP On Tramadol XR  Potential benefits of a long term opioids use as well as potential risks (i.e. addiction risk, apnea etc) and complications (i.e. Somnolence, constipation and others) were explained to the patient and were aknowledged.

## 2024-06-05 ENCOUNTER — Ambulatory Visit: Admitting: Internal Medicine

## 2024-07-13 ENCOUNTER — Ambulatory Visit: Payer: Medicare Other
